# Patient Record
Sex: Female | Born: 1945
Health system: Southern US, Community
[De-identification: ages and names within clinical notes are randomized; demographics above are authoritative.]

## PROBLEM LIST (undated history)

## (undated) DIAGNOSIS — A419 Sepsis, unspecified organism: Secondary | ICD-10-CM

## (undated) DIAGNOSIS — F329 Major depressive disorder, single episode, unspecified: Secondary | ICD-10-CM

## (undated) DIAGNOSIS — I10 Essential (primary) hypertension: Secondary | ICD-10-CM

## (undated) DIAGNOSIS — J301 Allergic rhinitis due to pollen: Secondary | ICD-10-CM

## (undated) DIAGNOSIS — F32A Depression, unspecified: Secondary | ICD-10-CM

## (undated) DIAGNOSIS — I499 Cardiac arrhythmia, unspecified: Secondary | ICD-10-CM

## (undated) DIAGNOSIS — K219 Gastro-esophageal reflux disease without esophagitis: Secondary | ICD-10-CM

## (undated) DIAGNOSIS — Z9289 Personal history of other medical treatment: Secondary | ICD-10-CM

## (undated) DIAGNOSIS — B019 Varicella without complication: Secondary | ICD-10-CM

## (undated) DIAGNOSIS — I219 Acute myocardial infarction, unspecified: Secondary | ICD-10-CM

## (undated) DIAGNOSIS — Z87898 Personal history of other specified conditions: Secondary | ICD-10-CM

## (undated) DIAGNOSIS — J189 Pneumonia, unspecified organism: Secondary | ICD-10-CM

## (undated) DIAGNOSIS — E785 Hyperlipidemia, unspecified: Secondary | ICD-10-CM

## (undated) DIAGNOSIS — Z8639 Personal history of other endocrine, nutritional and metabolic disease: Secondary | ICD-10-CM

## (undated) DIAGNOSIS — J449 Chronic obstructive pulmonary disease, unspecified: Secondary | ICD-10-CM

## (undated) DIAGNOSIS — E039 Hypothyroidism, unspecified: Secondary | ICD-10-CM

## (undated) DIAGNOSIS — I2510 Atherosclerotic heart disease of native coronary artery without angina pectoris: Secondary | ICD-10-CM

## (undated) HISTORY — DX: Depression, unspecified: F32.A

## (undated) HISTORY — DX: Essential (primary) hypertension: I10

## (undated) HISTORY — DX: Hypothyroidism, unspecified: E03.9

## (undated) HISTORY — DX: Major depressive disorder, single episode, unspecified: F32.9

## (undated) HISTORY — DX: Hyperlipidemia, unspecified: E78.5

## (undated) HISTORY — PX: TUBAL LIGATION: SHX77

## (undated) HISTORY — DX: Varicella without complication: B01.9

## (undated) HISTORY — DX: Sepsis, unspecified organism: A41.9

## (undated) HISTORY — DX: Pneumonia, unspecified organism: J18.9

## (undated) HISTORY — PX: TONSILLECTOMY: SUR1361

## (undated) HISTORY — DX: Cardiac arrhythmia, unspecified: I49.9

## (undated) HISTORY — DX: Acute myocardial infarction, unspecified: I21.9

## (undated) HISTORY — DX: Personal history of other medical treatment: Z92.89

## (undated) HISTORY — DX: Personal history of other endocrine, nutritional and metabolic disease: Z86.39

## (undated) HISTORY — DX: Atherosclerotic heart disease of native coronary artery without angina pectoris: I25.10

## (undated) HISTORY — DX: Chronic obstructive pulmonary disease, unspecified: J44.9

## (undated) HISTORY — DX: Gastro-esophageal reflux disease without esophagitis: K21.9

## (undated) HISTORY — DX: Personal history of other specified conditions: Z87.898

## (undated) HISTORY — DX: Allergic rhinitis due to pollen: J30.1

---

## 1998-12-05 DIAGNOSIS — Z87898 Personal history of other specified conditions: Secondary | ICD-10-CM

## 1998-12-05 HISTORY — DX: Personal history of other specified conditions: Z87.898

## 2007-12-06 HISTORY — PX: CORONARY ANGIOPLASTY WITH STENT PLACEMENT: SHX49

## 2010-04-19 LAB — HM PAP SMEAR: HM Pap smear: NORMAL

## 2010-04-19 LAB — HM COLONOSCOPY: HM Colonoscopy: NORMAL

## 2011-05-21 LAB — HM MAMMOGRAPHY: HM Mammogram: NORMAL

## 2012-03-08 DIAGNOSIS — J189 Pneumonia, unspecified organism: Secondary | ICD-10-CM | POA: Diagnosis not present

## 2012-04-09 ENCOUNTER — Ambulatory Visit (INDEPENDENT_AMBULATORY_CARE_PROVIDER_SITE_OTHER): Payer: Medicare Other | Admitting: Pulmonary Disease

## 2012-04-09 ENCOUNTER — Encounter: Payer: Self-pay | Admitting: Pulmonary Disease

## 2012-04-09 VITALS — BP 114/74 | HR 66 | Temp 97.7°F | Ht 66.0 in | Wt 175.4 lb

## 2012-04-09 DIAGNOSIS — F17200 Nicotine dependence, unspecified, uncomplicated: Secondary | ICD-10-CM | POA: Diagnosis not present

## 2012-04-09 DIAGNOSIS — Z720 Tobacco use: Secondary | ICD-10-CM

## 2012-04-09 DIAGNOSIS — J449 Chronic obstructive pulmonary disease, unspecified: Secondary | ICD-10-CM | POA: Diagnosis not present

## 2012-04-09 DIAGNOSIS — J189 Pneumonia, unspecified organism: Secondary | ICD-10-CM

## 2012-04-09 DIAGNOSIS — J4489 Other specified chronic obstructive pulmonary disease: Secondary | ICD-10-CM

## 2012-04-09 NOTE — Progress Notes (Signed)
Subjective:    Patient ID: Jodi Beasley, female    DOB: 28-Apr-1946, 66 y.o.   MRN: [id number]  HPI 66 y/o female with a prior diagnosis of COPD comes to our clinic for the first time today to establish care.  She says that she has been under the care of Dr. Cristal Deer Beasley in [place], [place] for the last several years for shortness of breath and cough.  She believes that she has COPD.  She developed dyspnea on exertion after a PCI was performed for CAD in 2009.  She states that prior to that she had minimal symptoms.  She tolerated the symptoms for a while but then eventually was seen by Dr. Aundra Millet and was started on Symbicort and proAir.  She says that those have improved her shortness of breath dramatically.  She can do all the chores in her house and do her shopping without difficulty, but she gets short of breath when she climbs a hill or if chasing after her granddaughter.  She has a daily cough which is productive of scant clear sputum.  She was diagnosed with pneumonia three weeks ago when she developed fever and productive cough.  She went to urgent care where she was given a zpack and an another antibiotic via shot.  She was also given ventolin with a spacer.  Her symptoms have improved though she still notes some sputum production. She does not have chest pain or tightness with shortness of breath and does not have swelling. She states that over the years she has had allergy problems, and has seen an allergist for years (runny nose, sneeze).  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  Pine pollen and mold are particularly bad.  Past Medical History  Diagnosis Date  . Depression   . Chicken pox   . GERD (gastroesophageal reflux disease)   . Hay fever   . Hypertension   . Hyperlipidemia   . History of blood transfusion   . Pneumonia, organism unspecified   . COPD (chronic obstructive pulmonary disease)   . Hypothyroid      Family History   Problem Relation Age of Onset  . Colon cancer Father   . Lung cancer Father     was a former smoker  . Hypertension    . Diabetes       History   Social History  . Marital Status: Widowed    Spouse Name: N/A    Number of Children: N/A  . Years of Education: N/A   Occupational History  . Not on file.   Social History Main Topics  . Smoking status: Current Everyday Smoker -- 0.5 packs/day for 53 years    Types: Cigarettes  . Smokeless tobacco: Never Used  . Alcohol Use: 0.5 oz/week    1 drink(s) per week  . Drug Use: Not on file  . Sexually Active: Not on file   Other Topics Concern  . Not on file   Social History Narrative  . No narrative on file     Allergies  Allergen Reactions  . Benzocaine     Tongue swelling  . Darvon (Propoxyphene Hcl)     HA  . Neosporin (Neomycin-Bacitracin Zn-Polymyx)     blisters  . Nicoderm (Nicotine)     arrythmia     No outpatient prescriptions prior to visit.    Review of Systems  Constitutional: Negative for fever, chills and unexpected weight change.  HENT: Positive for rhinorrhea and postnasal drip. Negative for ear pain, nosebleeds, congestion, sore throat, sneezing, trouble swallowing, dental problem, voice change and sinus pressure.   Eyes: Negative for visual disturbance.  Respiratory: Positive for cough and shortness of breath. Negative for choking.   Cardiovascular: Negative for chest pain and leg swelling.  Gastrointestinal: Negative for vomiting, abdominal pain and diarrhea.  Genitourinary: Negative for difficulty urinating.  Musculoskeletal: Positive for arthralgias.  Skin: Negative for rash.  Neurological: Negative for tremors, syncope and headaches.  Hematological: Does not bruise/bleed easily.       Objective:   Physical Exam Filed Vitals:   04/09/12 1407  BP: 114/74  Pulse: 66  Temp: 97.7 F (36.5 C)  TempSrc: Oral  Height: 5\' 6"  (1.676 m)  Weight: 175 lb 6.4 oz (79.561 kg)  SpO2: 97%     Gen: well appearing, no acute distress HEENT: NCAT, PERRL, EOMi, OP clear, neck supple without masses;  PULM: CTA B CV: RRR, no mgr, no JVD AB: BS+, soft, nontender, no hsm Ext: warm, no edema, no clubbing, no cyanosis Derm: no rash or skin breakdown Neuro: A&Ox4, CN II-XII intact, strength 5/5 in all 4 extremities  04/09/2012 Simple Spirometry: Ratio 71%, FEV1 1.92L (77% pred); flow volume loop consistent with obstruction     Assessment & Plan:   COPD (chronic obstructive pulmonary disease) with chronic bronchitis [name] notes several years of dyspnea on exertion, daily cough, and scant sputum production after years of tobacco abuse.  Based on her history she has chronic bronchitis, but her simple spirometry today showed some evidence of obstruction on the flow volume loop but the F/F ratio and FEV1 was not diagnostic of COPD.  These were adequate studies.   I will obtain the records from her prior pulmonologist Dr. Aundra Millet to see what other work up has been performed.  In the meantime we will continue her symbicort and proAir HFA for a diagnosis of chronic bronchitis.  Based on her PFT's, I think we could step this down to a single agent, but she is reluctant to change medications on our first visit today.    We will send her for a CXR PA/Lateral to further assess her daily cough and shortness of breath.  She is up to date on her immunizations.    Pneumonia [name] was diagnosed with pneumonia 3 weeks ago at an urgent care facility and treated with a z-pack.  No CXR was obtained.  We will send her for a follow up CXR now to ensure resolution and to evaluate her chronic dyspnea and cough.  Tobacco abuse She has struggled with this for years.  We discussed the negative side effects at length today. She has tried Jodi Beasley, nicotine gum, nicotine patches, and hypnosis in the past.  She had severely bad dreams on the Jodi Beasley in the past.  She is willing to try calling the Jodi Beasley  for counselling help but doesn't want to start Jodi Beasley today per my recommendation.  She will think about it.    Updated Medication List Outpatient Encounter Prescriptions as of 04/09/2012  Medication Sig Dispense Refill  . albuterol (PROAIR HFA) 108 (90 BASE) MCG/ACT inhaler Inhale 2 puffs into the lungs every 6 (six) hours as needed.      Marland Kitchen aspirin 81 MG tablet Take 81 mg by mouth daily.      Marland Kitchen b complex vitamins tablet Take 1 tablet by mouth daily.      . carisoprodol (SOMA) 350 MG  tablet Take 350 mg by mouth 2 (two) times daily.      . cloNIDine (CATAPRES) 0.1 MG tablet Take 0.1 mg by mouth daily as needed.      . Coenzyme Q10 (CO Q-10) 100 MG CAPS Take 200 mg by mouth daily.      . fexofenadine (ALLEGRA) 180 MG tablet Take 180 mg by mouth daily.      . fluticasone (FLONASE) 50 MCG/ACT nasal spray Place 2 sprays into the nose daily.      . Glucosamine-Chondroit-Vit C-Mn (GLUCOSAMINE CHONDR 1500 COMPLX PO) Take 1 tablet by mouth daily.      . metoprolol (LOPRESSOR) 100 MG tablet Take 100 mg by mouth 2 (two) times daily.      . Multiple Vitamin (MULTIVITAMIN) capsule Take 1 capsule by mouth daily.      . nitroGLYCERIN (NITROSTAT) 0.4 MG SL tablet Place 0.4 mg under the tongue every 5 (five) minutes as needed.      . Omega-3 Fatty Acids (FISH OIL) 1200 MG CAPS Take 1 capsule by mouth daily.      Marland Kitchen omeprazole (PRILOSEC) 20 MG capsule Take 20 mg by mouth daily.      . Potassium Gluconate 550 MG TABS Take 1 tablet by mouth daily.      . rosuvastatin (CRESTOR) 40 MG tablet Take 40 mg by mouth daily.

## 2012-04-09 NOTE — Assessment & Plan Note (Signed)
Jodi Beasley was diagnosed with pneumonia 3 weeks ago at an urgent care facility and treated with a z-pack.  No CXR was obtained.  We will send her for a follow up CXR now to ensure resolution and to evaluate her chronic dyspnea and cough.

## 2012-04-09 NOTE — Assessment & Plan Note (Signed)
She has struggled with this for years.  We discussed the negative side effects at length today. She has tried Chantix, nicotine gum, nicotine patches, and hypnosis in the past.  She had severely bad dreams on the Chantix in the past.  She is willing to try calling the [place] [facility] for counselling help but doesn't want to start Wellbutrin today per my recommendation.  She will think about it.

## 2012-04-09 NOTE — Patient Instructions (Signed)
We will obtain records from Dr. Cristal Deer Poor's office. We will send you for a CXR to follow up your diagnosis of pneumonia. Continue taking your Symbicort and proAir as you are doing.  There is no reason that you need to substitute or even continue the ventolin for the proAir. Quit smoking! Call the 1-800-QUITNOW for assistance.  We will help you with medications if you desire.  We will see you back in 3-4 weeks.

## 2012-04-09 NOTE — Assessment & Plan Note (Addendum)
Jodi Beasley notes several years of dyspnea on exertion, daily cough, and scant sputum production after years of tobacco abuse.  Based on her history she has chronic bronchitis, but her simple spirometry today showed some evidence of obstruction on the flow volume loop but the F/F ratio and FEV1 was not diagnostic of COPD.  These were adequate studies.   I will obtain the records from her prior pulmonologist Dr. Aundra Millet to see what other work up has been performed.  In the meantime we will continue her symbicort and proAir HFA for a diagnosis of chronic bronchitis.  Based on her PFT's, I think we could step this down to a single agent, but she is reluctant to change medications on our first visit today.    We will send her for a CXR PA/Lateral to further assess her daily cough and shortness of breath.  She is up to date on her immunizations.

## 2012-04-12 ENCOUNTER — Ambulatory Visit (INDEPENDENT_AMBULATORY_CARE_PROVIDER_SITE_OTHER)
Admission: RE | Admit: 2012-04-12 | Discharge: 2012-04-12 | Disposition: A | Discharge status: Home / Self Care | Payer: Medicare Other | Source: Ambulatory Visit | Attending: Pulmonary Disease | Admitting: Pulmonary Disease

## 2012-04-12 DIAGNOSIS — J189 Pneumonia, unspecified organism: Secondary | ICD-10-CM | POA: Diagnosis not present

## 2012-04-12 DIAGNOSIS — J449 Chronic obstructive pulmonary disease, unspecified: Secondary | ICD-10-CM

## 2012-04-16 ENCOUNTER — Telehealth: Payer: Self-pay | Admitting: Pulmonary Disease

## 2012-04-16 NOTE — Telephone Encounter (Signed)
Pt is requesting her cxr results from 04/12/12. Please advise Dr. Kendrick Fries thanks

## 2012-04-16 NOTE — Telephone Encounter (Signed)
[id number] [id number] 

## 2012-04-16 NOTE — Telephone Encounter (Signed)
Please let her know it was normal 

## 2012-04-16 NOTE — Telephone Encounter (Signed)
I spoke with patient about results and she verbalized understanding and had no questions 

## 2012-04-19 ENCOUNTER — Ambulatory Visit (INDEPENDENT_AMBULATORY_CARE_PROVIDER_SITE_OTHER): Payer: Medicare Other | Admitting: Internal Medicine

## 2012-04-19 ENCOUNTER — Encounter: Payer: Self-pay | Admitting: Internal Medicine

## 2012-04-19 VITALS — BP 108/72 | HR 70 | Temp 98.2°F | Resp 16 | Wt 174.2 lb

## 2012-04-19 DIAGNOSIS — E039 Hypothyroidism, unspecified: Secondary | ICD-10-CM

## 2012-04-19 DIAGNOSIS — Z1283 Encounter for screening for malignant neoplasm of skin: Secondary | ICD-10-CM | POA: Diagnosis not present

## 2012-04-19 DIAGNOSIS — I2510 Atherosclerotic heart disease of native coronary artery without angina pectoris: Secondary | ICD-10-CM | POA: Diagnosis not present

## 2012-04-19 DIAGNOSIS — J4489 Other specified chronic obstructive pulmonary disease: Secondary | ICD-10-CM

## 2012-04-19 DIAGNOSIS — J449 Chronic obstructive pulmonary disease, unspecified: Secondary | ICD-10-CM | POA: Insufficient documentation

## 2012-04-19 DIAGNOSIS — F17200 Nicotine dependence, unspecified, uncomplicated: Secondary | ICD-10-CM

## 2012-04-19 DIAGNOSIS — N952 Postmenopausal atrophic vaginitis: Secondary | ICD-10-CM | POA: Insufficient documentation

## 2012-04-19 DIAGNOSIS — E785 Hyperlipidemia, unspecified: Secondary | ICD-10-CM

## 2012-04-19 DIAGNOSIS — Z720 Tobacco use: Secondary | ICD-10-CM

## 2012-04-19 MED ORDER — BUDESONIDE-FORMOTEROL FUMARATE 160-4.5 MCG/ACT IN AERO: 2.0000 | INHALATION_SPRAY | Freq: Two times a day (BID) | RESPIRATORY_TRACT | Status: DC

## 2012-04-19 MED ORDER — LEVOTHYROXINE SODIUM 125 MCG PO TABS: 125.0000 ug | ORAL_TABLET | Freq: Every day | ORAL | Status: DC

## 2012-04-19 NOTE — Progress Notes (Signed)
Subjective:    Patient ID: Jodi Beasley, female    DOB: July 26, 1946, 66 y.o.   MRN: [id number]  HPI 66 year old female with history of COPD, CAD, hyperlipidemia, hypothyroidism presents to establish care. She reports that she is generally feeling well. She recently moved to [facility] from [facility]. She reports that she has established care with pulmonary medicine. She reports that she recently had an episode of pneumonia for which she was treated as an outpatient. She feels that her symptoms have completely resolved. She continues to have some mild shortness of breath which is chronic for her. She is a current smoker. She smokes approximately 6 cigarettes per day. She is not interested in quitting at this time.  In regards to her history of CAD, she reports that in 2009 she developed chest pain and was seen at [facility]. She ultimately had 4 stents placed. Review of record shows that these were drug-eluting stents. However, she is no longer taking Plavix. She denies any recent chest pain, palpitations, or other symptoms. She reports a history of very elevated cholesterol the past. She is currently taking Crestor 40 mg daily. She denies any symptoms such as myalgia at this time.  Outpatient Encounter Prescriptions as of 04/19/2012  Medication Sig Dispense Refill  . albuterol (PROAIR HFA) 108 (90 BASE) MCG/ACT inhaler Inhale 2 puffs into the lungs every 6 (six) hours as needed.      Marland Kitchen aspirin 81 MG tablet Take 81 mg by mouth daily.      Marland Kitchen b complex vitamins tablet Take 1 tablet by mouth daily.      . carisoprodol (SOMA) 350 MG tablet Take 350 mg by mouth 2 (two) times daily.      . cloNIDine (CATAPRES) 0.1 MG tablet Take 0.1 mg by mouth daily as needed.      . Coenzyme Q10 (CO Q-10) 100 MG CAPS Take 200 mg by mouth daily.      . fexofenadine (ALLEGRA) 180 MG tablet Take 180 mg by mouth daily.      . fluticasone (FLONASE) 50 MCG/ACT nasal spray Place 2 sprays into the  nose daily.      . Glucosamine-Chondroit-Vit C-Mn (GLUCOSAMINE CHONDR 1500 COMPLX PO) Take 1 tablet by mouth daily.      . metoprolol (LOPRESSOR) 100 MG tablet Take 100 mg by mouth 2 (two) times daily.      . Multiple Vitamin (MULTIVITAMIN) capsule Take 1 capsule by mouth daily.      . nitroGLYCERIN (NITROSTAT) 0.4 MG SL tablet Place 0.4 mg under the tongue every 5 (five) minutes as needed.      . Omega-3 Fatty Acids (FISH OIL) 1200 MG CAPS Take 1 capsule by mouth daily.      Marland Kitchen omeprazole (PRILOSEC) 20 MG capsule Take 20 mg by mouth daily.      . Potassium Gluconate 550 MG TABS Take 1 tablet by mouth daily.      . rosuvastatin (CRESTOR) 40 MG tablet Take 40 mg by mouth daily.      . budesonide-formoterol (SYMBICORT) 160-4.5 MCG/ACT inhaler Inhale 2 puffs into the lungs 2 (two) times daily.  1 Inhaler  3  . levothyroxine (SYNTHROID, LEVOTHROID) 125 MCG tablet Take 1 tablet (125 mcg total) by mouth daily.  30 tablet  3    Review of Systems  Constitutional: Negative for fever, chills, appetite change, fatigue and unexpected weight change.  HENT: Negative for ear pain, congestion, sore throat, trouble swallowing, neck pain, voice  change and sinus pressure.   Eyes: Negative for visual disturbance.  Respiratory: Positive for cough and shortness of breath. Negative for wheezing and stridor.   Cardiovascular: Negative for chest pain, palpitations and leg swelling.  Gastrointestinal: Negative for nausea, vomiting, abdominal pain, diarrhea, constipation, blood in stool, abdominal distention and anal bleeding.  Genitourinary: Negative for dysuria and flank pain.  Musculoskeletal: Negative for myalgias, arthralgias and gait problem.  Skin: Negative for color change and rash.  Neurological: Negative for dizziness and headaches.  Hematological: Negative for adenopathy. Does not bruise/bleed easily.  Psychiatric/Behavioral: Negative for suicidal ideas, sleep disturbance and dysphoric mood. The patient is  not nervous/anxious.    BP 108/72  Pulse 70  Temp(Src) 98.2 F (36.8 C) (Oral)  Resp 16  Wt 174 lb 4 oz (79.039 kg)  SpO2 97%     Objective:   Physical Exam  Constitutional: She is oriented to person, place, and time. She appears well-developed and well-nourished. No distress.  HENT:  Head: Normocephalic and atraumatic.  Right Ear: External ear normal.  Left Ear: External ear normal.  Nose: Nose normal.  Mouth/Throat: Oropharynx is clear and moist. No oropharyngeal exudate.  Eyes: Conjunctivae are normal. Pupils are equal, round, and reactive to light. Right eye exhibits no discharge. Left eye exhibits no discharge. No scleral icterus.  Neck: Normal range of motion. Neck supple. No tracheal deviation present. No thyromegaly present.  Cardiovascular: Normal rate, regular rhythm, normal heart sounds and intact distal pulses.  Exam reveals no gallop and no friction rub.   No murmur heard. Pulmonary/Chest: Effort normal. No accessory muscle usage. Not tachypneic. No respiratory distress. She has decreased breath sounds (prolonged exp). She has no wheezes. She has no rales. She exhibits no tenderness.  Abdominal: Soft. Bowel sounds are normal. She exhibits no distension and no mass. There is no tenderness. There is no guarding.  Musculoskeletal: Normal range of motion. She exhibits no edema and no tenderness.  Lymphadenopathy:    She has no cervical adenopathy.  Neurological: She is alert and oriented to person, place, and time. No cranial nerve deficit. She exhibits normal muscle tone. Coordination normal.  Skin: Skin is warm and dry. No rash noted. She is not diaphoretic. No erythema. No pallor.  Psychiatric: She has a normal mood and affect. Her behavior is normal. Judgment and thought content normal.          Assessment & Plan:

## 2012-04-19 NOTE — Assessment & Plan Note (Signed)
Encourage smoking cessation. Patient is not ready to quit at this time. Will continue to revisit. Given greater than 30-pack-year smoking history, will need yearly CT scan of the chest a screening for lung cancer.

## 2012-04-19 NOTE — Assessment & Plan Note (Signed)
Will set up dermatology referral for general exam.

## 2012-04-19 NOTE — Assessment & Plan Note (Signed)
We discussed the benefits and risk of topical estrogen preparations for atrophic vaginitis. Patient would prefer to continue to use this sparingly, as needed for symptoms of vaginal dryness and pain.

## 2012-04-19 NOTE — Assessment & Plan Note (Signed)
Will review recent labs including TSH level. Continue Synthroid.

## 2012-04-19 NOTE — Assessment & Plan Note (Signed)
Patient with history of coronary artery disease. Goal LDL less than 70. Will obtain recent records on lipids and LFTs. Continue Crestor 40 mg daily.

## 2012-04-19 NOTE — Assessment & Plan Note (Signed)
Symptoms currently well-controlled with inhaled bronchodilators and steroids. Given that patient has greater than 30 pack year history of smoking, recent guidelines would indicate patient needs screening CT of the chest on yearly basis. Will review recent records to see if this is been performed. If not, will set up scan. Encouraged smoking cessation. Followup in 3 months.

## 2012-04-19 NOTE — Assessment & Plan Note (Signed)
Currently asymptomatic. Will obtain records on previous evaluation and management. Will continue Crestor. Goal LDL less than 70. Will continue aspirin and metoprolol. Patient will establish care with cardiology next week. She will followup here in 3 months.

## 2012-04-23 ENCOUNTER — Encounter: Payer: Self-pay | Admitting: Cardiovascular Disease

## 2012-05-01 ENCOUNTER — Encounter: Payer: Self-pay | Admitting: Cardiovascular Disease

## 2012-05-01 ENCOUNTER — Telehealth: Payer: Self-pay | Admitting: Internal Medicine

## 2012-05-01 ENCOUNTER — Ambulatory Visit (INDEPENDENT_AMBULATORY_CARE_PROVIDER_SITE_OTHER): Payer: Medicare Other | Admitting: Cardiovascular Disease

## 2012-05-01 VITALS — BP 110/78 | HR 64 | Ht 66.0 in | Wt 172.5 lb

## 2012-05-01 DIAGNOSIS — R0602 Shortness of breath: Secondary | ICD-10-CM

## 2012-05-01 DIAGNOSIS — F17200 Nicotine dependence, unspecified, uncomplicated: Secondary | ICD-10-CM

## 2012-05-01 DIAGNOSIS — I2510 Atherosclerotic heart disease of native coronary artery without angina pectoris: Secondary | ICD-10-CM

## 2012-05-01 DIAGNOSIS — I10 Essential (primary) hypertension: Secondary | ICD-10-CM | POA: Insufficient documentation

## 2012-05-01 DIAGNOSIS — E785 Hyperlipidemia, unspecified: Secondary | ICD-10-CM | POA: Insufficient documentation

## 2012-05-01 DIAGNOSIS — Z720 Tobacco use: Secondary | ICD-10-CM

## 2012-05-01 DIAGNOSIS — J0110 Acute frontal sinusitis, unspecified: Secondary | ICD-10-CM | POA: Diagnosis not present

## 2012-05-01 DIAGNOSIS — J449 Chronic obstructive pulmonary disease, unspecified: Secondary | ICD-10-CM | POA: Diagnosis not present

## 2012-05-01 DIAGNOSIS — R079 Chest pain, unspecified: Secondary | ICD-10-CM | POA: Diagnosis not present

## 2012-05-01 NOTE — Patient Instructions (Signed)
Continue same medications.  Follow up in 6 months.  

## 2012-05-01 NOTE — Telephone Encounter (Signed)
Caller: Charnell/Patient; PCP: Ronna Polio; CB#: [phone]; Call regarding Cough; onset approx 04/24/12.   Green tinged sputum.  Uses Symbicort and Proair.  See provider in 4 hours per Cough  protocol.  Advised to go to [facility] or ED  by Jasmine December at office.  Caller informed of same. Caller unsure which facility she will go to.

## 2012-05-01 NOTE — Assessment & Plan Note (Signed)
She is on high-dose Crestor. She will be having a fasting lipid and liver profile in the next few months which was ordered by Dr. Dan Humphreys. I agree with a target LDL of less than 70.

## 2012-05-01 NOTE — Progress Notes (Signed)
HPI  This is a 66 year old female who is here to transfer cardiovascular care from [facility] clinic after she moved recently to [place]. She has known history of coronary artery disease. She presented in October 2009 with a small non-ST elevation myocardial infarction. Cardiac catheterization showed significant two-vessel coronary artery disease in the RCA as well as first diagonal. She underwent an angioplasty and drug-eluting stent placement x2 to the RCA and 1 stent to the first diagonal. Shortly after the procedure and while she was in the holding area, she developed acute chest pain with lateral ST elevation. She was taken back to the cardiac cath lab where she was found to have acute stent thrombosis in the first diagonal. She underwent thrombectomy and balloon angioplasty. She took Plavix for about a year and a half after that. She has not had any cardiac events since then. She does have history of COPD and has cut down smoking to about 6 cigarettes a day. She's not able to quit at this time. Most recent stress test was in 2010 which showed no evidence of ischemia with an ejection fraction of 69%. She has known history of hyperlipidemia with intolerance to multiple statins. She has been able to tolerate Crestor. She currently denies any chest pain. She has chronic dyspnea related to COPD. She denies any palpitations, syncope or presyncope. She has been having increased problems of cough and frequent bronchitis. All her cardiac records were reviewed.  Allergies  Allergen Reactions  . Benzocaine     Tongue swelling  . Darvon (Propoxyphene Hcl)     HA  . Neosporin (Neomycin-Bacitracin Zn-Polymyx)     blisters  . Nicoderm (Nicotine)     arrythmia     Current Outpatient Prescriptions on File Prior to Visit  Medication Sig Dispense Refill  . albuterol (PROAIR HFA) 108 (90 BASE) MCG/ACT inhaler Inhale 2 puffs into the lungs every 6 (six) hours as needed.      Marland Kitchen aspirin 81 MG tablet Take  81 mg by mouth daily.      Marland Kitchen azelastine (ASTELIN) 137 MCG/SPRAY nasal spray Place 1 spray into the nose 2 (two) times daily. Use in each nostril as directed      . b complex vitamins tablet Take 1 tablet by mouth daily.      . budesonide-formoterol (SYMBICORT) 160-4.5 MCG/ACT inhaler Inhale 2 puffs into the lungs 2 (two) times daily.  1 Inhaler  3  . carisoprodol (SOMA) 350 MG tablet Take 350 mg by mouth 2 (two) times daily.      . citalopram (CELEXA) 20 MG tablet Take 20 mg by mouth daily.      . cloNIDine (CATAPRES) 0.1 MG tablet Take 0.1 mg by mouth daily as needed.      . Coenzyme Q-10 400 MG CAPS Take 400 mg by mouth 2 (two) times daily.      . fexofenadine (ALLEGRA) 180 MG tablet Take 180 mg by mouth daily.      . fluticasone (FLONASE) 50 MCG/ACT nasal spray Place 2 sprays into the nose daily.      . Glucosamine-Chondroit-Vit C-Mn (GLUCOSAMINE CHONDR 1500 COMPLX PO) Take 1 tablet by mouth daily.      Marland Kitchen levothyroxine (SYNTHROID, LEVOTHROID) 125 MCG tablet Take 1 tablet (125 mcg total) by mouth daily.  30 tablet  3  . metoprolol (LOPRESSOR) 100 MG tablet Take 100 mg by mouth 2 (two) times daily.      . Multiple Vitamin (MULTIVITAMIN) capsule Take 1 capsule  by mouth daily.      . nitroGLYCERIN (NITROSTAT) 0.4 MG SL tablet Place 0.4 mg under the tongue every 5 (five) minutes as needed.      . Omega-3 Fatty Acids (FISH OIL) 1200 MG CAPS Take 1 capsule by mouth daily.      Marland Kitchen omeprazole (PRILOSEC) 20 MG capsule Take 20 mg by mouth daily.      . Potassium Gluconate 550 MG TABS Take 1 tablet by mouth daily.      . rosuvastatin (CRESTOR) 40 MG tablet Take 40 mg by mouth daily.         Past Medical History  Diagnosis Date  . Depression   . Chicken pox   . GERD (gastroesophageal reflux disease)   . Hay fever   . Hypertension   . History of blood transfusion   . Pneumonia, organism unspecified   . Hypothyroid   . COPD (chronic obstructive pulmonary disease)   . History of syncope 2000     negative EP study  . History Of Hypercholesterolemia   . Arrhythmia   . Hyperlipidemia     intolerance to statins except Crestor.   . Coronary artery disease     NSTEMI in 09/2008. Cath : 80% RCA and 95% first diagonal. PCI and 2 DES placement  RCA and 1 DES to diagonal. Complicated by acute diagonal stent thrombosis . Treated by thrombectomy. Most recent nuclear stress test in 2010 showed no ischemia with normal EF.      Past Surgical History  Procedure Date  . Tubal ligation   . Tonsillectomy   . Coronary angioplasty with stent placement 2009    [facility], drug-eluting mid RCA,prox diag     Family History  Problem Relation Age of Onset  . Colon cancer Father   . Lung cancer Father     was a former smoker  . Cancer Father     lung  . Hypertension    . Diabetes    . Hypertension Mother      History   Social History  . Marital Status: Widowed    Spouse Name: N/A    Number of Children: N/A  . Years of Education: N/A   Occupational History  . Not on file.   Social History Main Topics  . Smoking status: Current Everyday Smoker -- 0.5 packs/day for 53 years    Types: Cigarettes  . Smokeless tobacco: Never Used  . Alcohol Use: 0.5 oz/week    1 drink(s) per week  . Drug Use: No  . Sexually Active: Not on file   Other Topics Concern  . Not on file   Social History Narrative   Lives with partner in [place]. 2 cats 2 dogs in home.Recently moved from [facility].Custody of 89month old.     ROS Constitutional: Negative for fever, chills, diaphoresis, activity change, appetite change and fatigue.  HENT: Negative for hearing loss, nosebleeds, congestion, sore throat, facial swelling, drooling, trouble swallowing, neck pain, voice change, sinus pressure and tinnitus.  Eyes: Negative for photophobia, pain, discharge and visual disturbance.  Respiratory: Negative for apnea,chest tightness.  Cardiovascular: Negative for chest pain, palpitations and leg swelling.    Gastrointestinal: Negative for nausea, vomiting, abdominal pain, diarrhea, constipation, blood in stool and abdominal distention.  Genitourinary: Negative for dysuria, urgency, frequency, hematuria and decreased urine volume.  Musculoskeletal: Negative for myalgias, back pain, joint swelling, arthralgias and gait problem.  Skin: Negative for color change, pallor, rash and wound.  Neurological: Negative for dizziness,  tremors, seizures, syncope, speech difficulty, weakness, light-headedness, numbness and headaches.  Psychiatric/Behavioral: Negative for suicidal ideas, hallucinations, behavioral problems and agitation. The patient is not nervous/anxious.     PHYSICAL EXAM   BP 110/78  Pulse 64  Ht 5\' 6"  (1.676 m)  Wt 172 lb 8 oz (78.245 kg)  BMI 27.84 kg/m2 Constitutional: She is oriented to person, place, and time. She appears well-developed and well-nourished. No distress.  HENT: No nasal discharge.  Head: Normocephalic and atraumatic.  Eyes: Pupils are equal and round. Right eye exhibits no discharge. Left eye exhibits no discharge.  Neck: Normal range of motion. Neck supple. No JVD present. No thyromegaly present.  Cardiovascular: Normal rate, regular rhythm, normal heart sounds. Exam reveals no gallop and no friction rub. No murmur heard.  Pulmonary/Chest: Effort normal and breath sounds normal. No stridor. No respiratory distress. She has no wheezes. She has no rales. She exhibits no tenderness.  Abdominal: Soft. Bowel sounds are normal. She exhibits no distension. There is no tenderness. There is no rebound and no guarding.  Musculoskeletal: Normal range of motion. She exhibits no edema and no tenderness.  Neurological: She is alert and oriented to person, place, and time. Coordination normal.  Skin: Skin is warm and dry. No rash noted. She is not diaphoretic. No erythema. No pallor.  Psychiatric: She has a normal mood and affect. Her behavior is normal. Judgment and thought  content normal.     EKG: Normal sinus rhythm with no significant ST or T wave changes. No evidence of prior infarcts.   ASSESSMENT AND PLAN

## 2012-05-01 NOTE — Assessment & Plan Note (Signed)
I discussed with the patient the importance of smoking cessation. She doesn't think she can quit at this time and she has tried multiple interventions in the past.

## 2012-05-01 NOTE — Assessment & Plan Note (Addendum)
The patient seems to be stable from a cardiac standpoint. She is not having symptoms suggestive of angina. I recommend continuing medical therapy. Most recent stress test was in 2010 which showed no evidence of ischemia with normal ejection fraction.

## 2012-05-01 NOTE — Assessment & Plan Note (Signed)
Her blood pressure is well controlled. Continue current medications. 

## 2012-05-11 ENCOUNTER — Ambulatory Visit (INDEPENDENT_AMBULATORY_CARE_PROVIDER_SITE_OTHER): Payer: Medicare Other | Admitting: Internal Medicine

## 2012-05-11 ENCOUNTER — Encounter: Payer: Self-pay | Admitting: Internal Medicine

## 2012-05-11 VITALS — BP 100/70 | HR 64 | Temp 98.6°F | Wt 172.8 lb

## 2012-05-11 DIAGNOSIS — J449 Chronic obstructive pulmonary disease, unspecified: Secondary | ICD-10-CM | POA: Diagnosis not present

## 2012-05-11 MED ORDER — AZELASTINE HCL 0.1 % NA SOLN: 1.0000 | Freq: Two times a day (BID) | NASAL | Status: DC

## 2012-05-11 NOTE — Assessment & Plan Note (Signed)
Patient has now recovered from recent episode of bronchitis. She is back to her baseline. Will continue inhaled bronchodilators and steroids. She has followup with her pulmonologist in 6 weeks. She will call sooner if symptoms recur or if she has any questions or concerns.

## 2012-05-11 NOTE — Progress Notes (Signed)
Subjective:    Patient ID: Jodi Beasley, female    DOB: 05-05-1946, 66 y.o.   MRN: [id number]  HPI 66 year old female with history of COPD, CAD presents for followup after recent evaluation at urgent care with diagnosis of bronchitis and sinusitis. She reports that she had several days of cough productive of purulent sputum, shortness of breath, sinus congestion and sinus pressure with headache. She was evaluated at urgent care and diagnosed with bronchitis and sinusitis. She was treated with Levaquin and hydrocodone for cough. She reports significant improvement with these medications. She has now completed her antibiotics. She continues to have some nonproductive cough. She denies shortness of breath, chest pain. She has not had any recent fever or chills.  Outpatient Encounter Prescriptions as of 05/11/2012  Medication Sig Dispense Refill  . albuterol (PROAIR HFA) 108 (90 BASE) MCG/ACT inhaler Inhale 2 puffs into the lungs every 6 (six) hours as needed.      Marland Kitchen aspirin 81 MG tablet Take 81 mg by mouth daily.      Marland Kitchen azelastine (ASTELIN) 137 MCG/SPRAY nasal spray Place 1 spray into the nose 2 (two) times daily. Use in each nostril as directed  30 mL  6  . b complex vitamins tablet Take 1 tablet by mouth daily.      . budesonide-formoterol (SYMBICORT) 160-4.5 MCG/ACT inhaler Inhale 2 puffs into the lungs 2 (two) times daily.  1 Inhaler  3  . carisoprodol (SOMA) 350 MG tablet Take 350 mg by mouth 2 (two) times daily.      . citalopram (CELEXA) 20 MG tablet Take 20 mg by mouth daily.      . cloNIDine (CATAPRES) 0.1 MG tablet Take 0.1 mg by mouth daily as needed.      . Coenzyme Q-10 400 MG CAPS Take 400 mg by mouth 2 (two) times daily.      . fexofenadine (ALLEGRA) 180 MG tablet Take 180 mg by mouth daily.      . fluticasone (FLONASE) 50 MCG/ACT nasal spray Place 2 sprays into the nose daily.      . Glucosamine-Chondroit-Vit C-Mn (GLUCOSAMINE CHONDR 1500 COMPLX PO) Take 1 tablet by mouth daily.        Marland Kitchen levothyroxine (SYNTHROID, LEVOTHROID) 125 MCG tablet Take 1 tablet (125 mcg total) by mouth daily.  30 tablet  3  . metoprolol (LOPRESSOR) 100 MG tablet Take 100 mg by mouth 2 (two) times daily.      . Multiple Vitamin (MULTIVITAMIN) capsule Take 1 capsule by mouth daily.      . nitroGLYCERIN (NITROSTAT) 0.4 MG SL tablet Place 0.4 mg under the tongue every 5 (five) minutes as needed.      . Omega-3 Fatty Acids (FISH OIL) 1200 MG CAPS Take 1 capsule by mouth daily.      Marland Kitchen omeprazole (PRILOSEC) 20 MG capsule Take 20 mg by mouth daily.      . Potassium Gluconate 550 MG TABS Take 1 tablet by mouth daily.      . rosuvastatin (CRESTOR) 40 MG tablet Take 40 mg by mouth daily.      Marland Kitchen DISCONTD: azelastine (ASTELIN) 137 MCG/SPRAY nasal spray Place 1 spray into the nose 2 (two) times daily. Use in each nostril as directed       BP 100/70  Pulse 64  Temp 98.6 F (37 C)  Wt 172 lb 12 oz (78.359 kg)  SpO2 94%  Review of Systems  Constitutional: Negative for fever, chills, appetite change, fatigue and unexpected weight  change.  HENT: Positive for congestion and sinus pressure. Negative for ear pain and neck pain.   Eyes: Negative for visual disturbance.  Respiratory: Positive for cough. Negative for shortness of breath, wheezing and stridor.   Cardiovascular: Negative for chest pain, palpitations and leg swelling.  Gastrointestinal: Negative for abdominal pain and abdominal distention.  Genitourinary: Negative for dysuria and flank pain.  Musculoskeletal: Negative for myalgias, arthralgias and gait problem.  Skin: Negative for color change and rash.  Neurological: Negative for dizziness and headaches.  Hematological: Negative for adenopathy. Does not bruise/bleed easily.  Psychiatric/Behavioral: Negative for suicidal ideas, sleep disturbance and dysphoric mood. The patient is not nervous/anxious.        Objective:   Physical Exam  Constitutional: She is oriented to person, place, and time.  She appears well-developed and well-nourished. No distress.  HENT:  Head: Normocephalic and atraumatic.  Right Ear: External ear normal.  Left Ear: External ear normal.  Nose: Nose normal.  Mouth/Throat: Oropharynx is clear and moist. No oropharyngeal exudate.  Eyes: Conjunctivae are normal. Pupils are equal, round, and reactive to light. Right eye exhibits no discharge. Left eye exhibits no discharge. No scleral icterus.  Neck: Normal range of motion. Neck supple. No tracheal deviation present. No thyromegaly present.  Cardiovascular: Normal rate, regular rhythm, normal heart sounds and intact distal pulses.  Exam reveals no gallop and no friction rub.   No murmur heard. Pulmonary/Chest: Effort normal. No accessory muscle usage. Not tachypneic. No respiratory distress. She has decreased breath sounds. She has no wheezes. She has no rhonchi. She has no rales. She exhibits no tenderness.  Musculoskeletal: Normal range of motion. She exhibits no edema and no tenderness.  Lymphadenopathy:    She has no cervical adenopathy.  Neurological: She is alert and oriented to person, place, and time. No cranial nerve deficit. She exhibits normal muscle tone. Coordination normal.  Skin: Skin is warm and dry. No rash noted. She is not diaphoretic. No erythema. No pallor.  Psychiatric: She has a normal mood and affect. Her behavior is normal. Judgment and thought content normal.          Assessment & Plan:

## 2012-05-26 ENCOUNTER — Encounter: Payer: Self-pay | Admitting: Internal Medicine

## 2012-05-28 ENCOUNTER — Encounter: Payer: Self-pay | Admitting: Internal Medicine

## 2012-05-28 DIAGNOSIS — Z1239 Encounter for other screening for malignant neoplasm of breast: Secondary | ICD-10-CM

## 2012-06-11 ENCOUNTER — Encounter: Payer: Self-pay | Admitting: Pulmonary Disease

## 2012-06-11 ENCOUNTER — Ambulatory Visit (INDEPENDENT_AMBULATORY_CARE_PROVIDER_SITE_OTHER): Payer: Medicare Other | Admitting: Pulmonary Disease

## 2012-06-11 VITALS — BP 108/60 | HR 74 | Temp 98.0°F | Ht 66.0 in | Wt 173.0 lb

## 2012-06-11 DIAGNOSIS — Z720 Tobacco use: Secondary | ICD-10-CM

## 2012-06-11 DIAGNOSIS — J449 Chronic obstructive pulmonary disease, unspecified: Secondary | ICD-10-CM | POA: Diagnosis not present

## 2012-06-11 DIAGNOSIS — F17200 Nicotine dependence, unspecified, uncomplicated: Secondary | ICD-10-CM

## 2012-06-11 MED ORDER — NICOTINE 10 MG/ML NA SOLN: NASAL | Status: DC

## 2012-06-11 NOTE — Patient Instructions (Addendum)
Try stopping the Symbicort and replacing it with Spiriva. If you don't feel any worse on the Spiriva, call us and we will send in an Rx for it. Stop smoking. Try using the nicotrol inhaler as needed to help. Think about lung cancer screening, read the form we gave you.  We are happy to refer you if needed. We will see you back in 3 months or sooner if needed.

## 2012-06-11 NOTE — Progress Notes (Signed)
Subjective:    Patient ID: Jodi Beasley, female    DOB: [birth year], 66 y.o.   MRN: [id number]  Synopsis: Jodi Beasley established care with a [place] [facility] clinic in May 2013 procedure 80. She had samples from a tree at that time the ratio 71% the flow volume loop consistent with obstruction and an FEV1 of 1.92 L or 77% predicted. She then followed previously by Jodi Beasley the pulmonologist in the [facility] area. She had been treated for pneumonia 3 weeks prior to her initial visit.  She has smoked one half pack of cigarettes per day for 53 years and continues to smoke.  HPI    7/8 ROV -- Jodi Beasley returns to clinic today for evaluation of her COPD. She states that she had an episode of bronchitis in May which improved with treatment with antibiotics. She do not now how to get a hold of [place] so she ended up going to urgent care. Since then she states her symptoms have improved dramatically and she is not having to use her rescue inhaler much. She is using Astelin nasal spray more often than the Nasonex and she says it worked quite well. She really only has her breath in the heat and humidity. She continues to smoke and is frustrated with her inability to quit. She states that nicotine replacement is helped most of the past.   Past Medical History  Diagnosis Date  . Depression   . Chicken pox   . GERD (gastroesophageal reflux disease)   . Hay fever   . History of blood transfusion   . Pneumonia, organism unspecified   . Hypothyroid   . COPD (chronic obstructive pulmonary disease)   . History of syncope 2000    negative EP study  . History Of Hypercholesterolemia   . Arrhythmia   . Hyperlipidemia     intolerance to statins except Crestor.   . Coronary artery disease     NSTEMI in 09/2008. Cath : 80% RCA and 95% first diagonal. PCI and 2 DES placement  RCA and 1 DES to diagonal. Complicated by acute diagonal stent thrombosis . Treated by thrombectomy. Most recent  nuclear stress test in 2010 showed no ischemia with normal EF.   Marland Kitchen Hypertension     Review of Systems  Constitutional: Negative for fever, fatigue and unexpected weight change.  HENT: Negative for congestion, rhinorrhea, postnasal drip and sinus pressure.   Respiratory: Positive for shortness of breath. Negative for cough and choking.   Cardiovascular: Negative for chest pain and leg swelling.       Objective:   Physical Exam  Filed Vitals:   06/11/12 1338  BP: 108/60  Pulse: 74  Temp: 98 F (36.7 C)  TempSrc: Oral  Height: 5\' 6"  (1.676 m)  Weight: 173 lb (78.472 kg)  SpO2: 94%    Gen: well appearing, no acute distress HEENT: NCAT, PERRL, EOMi, OP clear, neck supple without masses;  PULM: CTA B CV: RRR, no mgr, no JVD AB: BS+, soft, nontender, no hsm Ext: warm, no edema, no clubbing, no cyanosis   04/09/2012 Simple Spirometry: Ratio 71%, FEV1 1.92L (77% pred); flow volume loop consistent with obstruction     Assessment & Plan:   No problem-specific assessment & plan notes found for this encounter.   Updated Medication List Outpatient Encounter Prescriptions as of 06/11/2012  Medication Sig Dispense Refill  . albuterol (PROAIR HFA) 108 (90 BASE) MCG/ACT inhaler Inhale 2 puffs into the lungs every 6 (six)  hours as needed.      Marland Kitchen aspirin 81 MG tablet Take 81 mg by mouth daily.      Marland Kitchen azelastine (ASTELIN) 137 MCG/SPRAY nasal spray Place 1 spray into the nose 2 (two) times daily. Use in each nostril as directed  30 mL  6  . b complex vitamins tablet Take 1 tablet by mouth daily.      . budesonide-formoterol (SYMBICORT) 160-4.5 MCG/ACT inhaler Inhale 2 puffs into the lungs 2 (two) times daily.  1 Inhaler  3  . carisoprodol (SOMA) 350 MG tablet Take 350 mg by mouth 2 (two) times daily.      . citalopram (CELEXA) 20 MG tablet Take 20 mg by mouth daily.      . cloNIDine (CATAPRES) 0.1 MG tablet Take 0.1 mg by mouth daily as needed.      . Coenzyme Q-10 400 MG CAPS Take 400 mg  by mouth 2 (two) times daily.      . fluticasone (FLONASE) 50 MCG/ACT nasal spray Place 2 sprays into the nose daily as needed.       . Glucosamine-Chondroit-Vit C-Mn (GLUCOSAMINE CHONDR 1500 COMPLX PO) Take 1 tablet by mouth daily.      Marland Kitchen levothyroxine (SYNTHROID, LEVOTHROID) 125 MCG tablet Take 1 tablet (125 mcg total) by mouth daily.  30 tablet  3  . metoprolol (LOPRESSOR) 100 MG tablet Take 100 mg by mouth 2 (two) times daily.      . Multiple Vitamin (MULTIVITAMIN) capsule Take 1 capsule by mouth daily.      . nitroGLYCERIN (NITROSTAT) 0.4 MG SL tablet Place 0.4 mg under the tongue every 5 (five) minutes as needed.      . Omega-3 Fatty Acids (FISH OIL) 1200 MG CAPS Take 1 capsule by mouth daily.      Marland Kitchen omeprazole (PRILOSEC) 20 MG capsule Take 20 mg by mouth daily.      . Potassium Gluconate 550 MG TABS Take 1 tablet by mouth daily.      . rosuvastatin (CRESTOR) 40 MG tablet Take 40 mg by mouth daily.      Marland Kitchen DISCONTD: fexofenadine (ALLEGRA) 180 MG tablet Take 180 mg by mouth daily.

## 2012-06-12 ENCOUNTER — Encounter: Payer: Self-pay | Admitting: Pulmonary Disease

## 2012-06-12 NOTE — Assessment & Plan Note (Addendum)
This is been a stable interval for Jodi Beasley. However she is still frustrated by the number of flares of COPD that she had in the spring of 2013. I explained to her that this is obviously due to her ongoing smoking. Will work on that. See below. However perhaps she may respond better to Spiriva so I instructed her to stop the Symbicort and to start a trial of tiotropium.

## 2012-06-12 NOTE — Assessment & Plan Note (Signed)
We talked about this at length today. She's been frustrated by her inability to quit over the years. She has tried of modalities including hypnosis 3 times as well as a variety of oral medications. She states that nicotine replacement worked the best in the past. Based on her insurance we will restart Nicotrol inhalers to use. She is again reminded on the negative side effects of ongoing neck nicotine use.

## 2012-06-20 DIAGNOSIS — L819 Disorder of pigmentation, unspecified: Secondary | ICD-10-CM | POA: Diagnosis not present

## 2012-06-20 DIAGNOSIS — L821 Other seborrheic keratosis: Secondary | ICD-10-CM | POA: Diagnosis not present

## 2012-06-21 LAB — HM MAMMOGRAPHY: HM Mammogram: NORMAL

## 2012-07-10 ENCOUNTER — Other Ambulatory Visit (INDEPENDENT_AMBULATORY_CARE_PROVIDER_SITE_OTHER): Payer: Medicare Other | Admitting: *Deleted

## 2012-07-10 DIAGNOSIS — E039 Hypothyroidism, unspecified: Secondary | ICD-10-CM

## 2012-07-10 DIAGNOSIS — E785 Hyperlipidemia, unspecified: Secondary | ICD-10-CM | POA: Diagnosis not present

## 2012-07-10 LAB — LIPID PANEL
Total CHOL/HDL Ratio: 3
VLDL: 41.4 mg/dL — ABNORMAL HIGH (ref 0.0–40.0)

## 2012-07-10 LAB — COMPREHENSIVE METABOLIC PANEL
ALT: 22 U/L (ref 0–35)
AST: 28 U/L (ref 0–37)
Alkaline Phosphatase: 58 U/L (ref 39–117)
Sodium: 135 mEq/L (ref 135–145)
Total Bilirubin: 0.8 mg/dL (ref 0.3–1.2)
Total Protein: 6.9 g/dL (ref 6.0–8.3)

## 2012-07-10 LAB — LDL CHOLESTEROL, DIRECT: Direct LDL: 89.1 mg/dL

## 2012-07-11 ENCOUNTER — Other Ambulatory Visit: Payer: Self-pay | Admitting: Internal Medicine

## 2012-07-11 DIAGNOSIS — E039 Hypothyroidism, unspecified: Secondary | ICD-10-CM

## 2012-07-11 LAB — T4, FREE: Free T4: 1.35 ng/dL (ref 0.60–1.60)

## 2012-07-11 MED ORDER — LEVOTHYROXINE SODIUM 112 MCG PO TABS: 112.0000 ug | ORAL_TABLET | Freq: Every day | ORAL | Status: DC

## 2012-07-12 ENCOUNTER — Other Ambulatory Visit: Payer: Medicare Other

## 2012-07-14 DIAGNOSIS — M5430 Sciatica, unspecified side: Secondary | ICD-10-CM | POA: Diagnosis not present

## 2012-07-14 DIAGNOSIS — M5137 Other intervertebral disc degeneration, lumbosacral region: Secondary | ICD-10-CM | POA: Diagnosis not present

## 2012-07-14 DIAGNOSIS — M531 Cervicobrachial syndrome: Secondary | ICD-10-CM | POA: Diagnosis not present

## 2012-07-14 DIAGNOSIS — M999 Biomechanical lesion, unspecified: Secondary | ICD-10-CM | POA: Diagnosis not present

## 2012-07-14 DIAGNOSIS — M9981 Other biomechanical lesions of cervical region: Secondary | ICD-10-CM | POA: Diagnosis not present

## 2012-07-16 DIAGNOSIS — M999 Biomechanical lesion, unspecified: Secondary | ICD-10-CM | POA: Diagnosis not present

## 2012-07-16 DIAGNOSIS — M9981 Other biomechanical lesions of cervical region: Secondary | ICD-10-CM | POA: Diagnosis not present

## 2012-07-16 DIAGNOSIS — M531 Cervicobrachial syndrome: Secondary | ICD-10-CM | POA: Diagnosis not present

## 2012-07-16 DIAGNOSIS — M5137 Other intervertebral disc degeneration, lumbosacral region: Secondary | ICD-10-CM | POA: Diagnosis not present

## 2012-07-16 DIAGNOSIS — M5430 Sciatica, unspecified side: Secondary | ICD-10-CM | POA: Diagnosis not present

## 2012-07-17 DIAGNOSIS — M5137 Other intervertebral disc degeneration, lumbosacral region: Secondary | ICD-10-CM | POA: Diagnosis not present

## 2012-07-17 DIAGNOSIS — M531 Cervicobrachial syndrome: Secondary | ICD-10-CM | POA: Diagnosis not present

## 2012-07-17 DIAGNOSIS — M9981 Other biomechanical lesions of cervical region: Secondary | ICD-10-CM | POA: Diagnosis not present

## 2012-07-17 DIAGNOSIS — M5430 Sciatica, unspecified side: Secondary | ICD-10-CM | POA: Diagnosis not present

## 2012-07-17 DIAGNOSIS — M999 Biomechanical lesion, unspecified: Secondary | ICD-10-CM | POA: Diagnosis not present

## 2012-07-19 ENCOUNTER — Encounter: Payer: Self-pay | Admitting: Internal Medicine

## 2012-07-19 ENCOUNTER — Ambulatory Visit (INDEPENDENT_AMBULATORY_CARE_PROVIDER_SITE_OTHER): Payer: Medicare Other | Admitting: Internal Medicine

## 2012-07-19 VITALS — BP 130/70 | HR 60 | Temp 98.2°F | Ht 66.0 in | Wt 171.8 lb

## 2012-07-19 DIAGNOSIS — I2510 Atherosclerotic heart disease of native coronary artery without angina pectoris: Secondary | ICD-10-CM | POA: Diagnosis not present

## 2012-07-19 DIAGNOSIS — Z720 Tobacco use: Secondary | ICD-10-CM

## 2012-07-19 DIAGNOSIS — F419 Anxiety disorder, unspecified: Secondary | ICD-10-CM

## 2012-07-19 DIAGNOSIS — I10 Essential (primary) hypertension: Secondary | ICD-10-CM

## 2012-07-19 DIAGNOSIS — J449 Chronic obstructive pulmonary disease, unspecified: Secondary | ICD-10-CM | POA: Diagnosis not present

## 2012-07-19 DIAGNOSIS — E785 Hyperlipidemia, unspecified: Secondary | ICD-10-CM

## 2012-07-19 DIAGNOSIS — F17200 Nicotine dependence, unspecified, uncomplicated: Secondary | ICD-10-CM

## 2012-07-19 DIAGNOSIS — F411 Generalized anxiety disorder: Secondary | ICD-10-CM

## 2012-07-19 MED ORDER — METOPROLOL TARTRATE 100 MG PO TABS: 100.0000 mg | ORAL_TABLET | Freq: Two times a day (BID) | ORAL | Status: DC

## 2012-07-19 NOTE — Assessment & Plan Note (Signed)
Symptoms of anxiety recently exacerbated by ongoing issues with finances. Offered support today. We'll continue citalopram. Discussed potentially increasing dose to 40 mg but patient would like to hold off on this for now. Follow up 3 months.

## 2012-07-19 NOTE — Assessment & Plan Note (Signed)
Blood pressure well-controlled on current medications. No changes made today. Follow up 3 months.

## 2012-07-19 NOTE — Progress Notes (Signed)
Subjective:    Patient ID: Jodi Beasley, female    DOB: 01/01/1946, 66 y.o.   MRN: [id number]  HPI 66 year old female with history of COPD, tobacco abuse, coronary artery disease, hypertension, hypothyroidism present for followup. She reports she is generally been feeling well. She reports that her breathing has improved compared to earlier this year when she had several exacerbations with bronchitis. She reports full compliance with her inhalers and other medications. She continues to smoke. She notes that recently anxiety levels have been increased because of stress about finances and decreased income. She's been looking for a second part-time job. She continues to take her citalopram to help with anxiety and reports some improvement with this. In the past, she had tried increasing citalopram dose to 40 mg but had difficulty tolerating because of some confusion.  Outpatient Encounter Prescriptions as of 07/19/2012  Medication Sig Dispense Refill  . albuterol (PROAIR HFA) 108 (90 BASE) MCG/ACT inhaler Inhale 2 puffs into the lungs every 6 (six) hours as needed.      Marland Kitchen aspirin 81 MG tablet Take 81 mg by mouth daily.      Marland Kitchen azelastine (ASTELIN) 137 MCG/SPRAY nasal spray Place 1 spray into the nose 2 (two) times daily. Use in each nostril as directed  30 mL  6  . b complex vitamins tablet Take 1 tablet by mouth daily.      . budesonide-formoterol (SYMBICORT) 160-4.5 MCG/ACT inhaler Inhale 2 puffs into the lungs 2 (two) times daily.  1 Inhaler  3  . carisoprodol (SOMA) 350 MG tablet Take 350 mg by mouth 2 (two) times daily.      . citalopram (CELEXA) 20 MG tablet Take 20 mg by mouth daily.      . cloNIDine (CATAPRES) 0.1 MG tablet Take 0.1 mg by mouth daily as needed.      . Coenzyme Q-10 400 MG CAPS Take 400 mg by mouth 2 (two) times daily.      . fluticasone (FLONASE) 50 MCG/ACT nasal spray Place 2 sprays into the nose daily as needed.       . Glucosamine-Chondroit-Vit C-Mn (GLUCOSAMINE CHONDR 1500  COMPLX PO) Take 1 tablet by mouth daily.      Marland Kitchen levothyroxine (SYNTHROID, LEVOTHROID) 112 MCG tablet Take 1 tablet (112 mcg total) by mouth daily.  30 tablet  3  . metoprolol (LOPRESSOR) 100 MG tablet Take 1 tablet (100 mg total) by mouth 2 (two) times daily.  60 tablet  6  . Multiple Vitamin (MULTIVITAMIN) capsule Take 1 capsule by mouth daily.      . Nicotine (NICOTROL NS) 10 MG/ML SOLN One puff every 2-4 hours as needed  2 Bottle  3  . nitroGLYCERIN (NITROSTAT) 0.4 MG SL tablet Place 0.4 mg under the tongue every 5 (five) minutes as needed.      . Omega-3 Fatty Acids (FISH OIL) 1200 MG CAPS Take 1 capsule by mouth daily.      Marland Kitchen omeprazole (PRILOSEC) 20 MG capsule Take 20 mg by mouth daily.      . Potassium Gluconate 550 MG TABS Take 1 tablet by mouth daily.      . rosuvastatin (CRESTOR) 40 MG tablet Take 40 mg by mouth daily.      Marland Kitchen DISCONTD: metoprolol (LOPRESSOR) 100 MG tablet Take 100 mg by mouth 2 (two) times daily.        Review of Systems  Constitutional: Negative for fever, chills, appetite change, fatigue and unexpected weight change.  HENT: Negative for  ear pain, congestion, sore throat, trouble swallowing, neck pain, voice change and sinus pressure.   Eyes: Negative for visual disturbance.  Respiratory: Negative for cough, shortness of breath, wheezing and stridor.   Cardiovascular: Negative for chest pain, palpitations and leg swelling.  Gastrointestinal: Negative for nausea, vomiting, abdominal pain, diarrhea, constipation, blood in stool, abdominal distention and anal bleeding.  Genitourinary: Negative for dysuria and flank pain.  Musculoskeletal: Negative for myalgias, arthralgias and gait problem.  Skin: Negative for color change and rash.  Neurological: Negative for dizziness and headaches.  Hematological: Negative for adenopathy. Does not bruise/bleed easily.  Psychiatric/Behavioral: Positive for dysphoric mood. Negative for suicidal ideas and disturbed wake/sleep cycle.  The patient is nervous/anxious.    BP 130/70  Pulse 60  Temp 98.2 F (36.8 C) (Oral)  Ht 5\' 6"  (1.676 m)  Wt 171 lb 12 oz (77.905 kg)  BMI 27.72 kg/m2  SpO2 97%     Objective:   Physical Exam  Constitutional: She is oriented to person, place, and time. She appears well-developed and well-nourished. No distress.  HENT:  Head: Normocephalic and atraumatic.  Right Ear: External ear normal.  Left Ear: External ear normal.  Nose: Nose normal.  Mouth/Throat: Oropharynx is clear and moist. No oropharyngeal exudate.  Eyes: Conjunctivae are normal. Pupils are equal, round, and reactive to light. Right eye exhibits no discharge. Left eye exhibits no discharge. No scleral icterus.  Neck: Normal range of motion. Neck supple. No tracheal deviation present. No thyromegaly present.  Cardiovascular: Normal rate, regular rhythm, normal heart sounds and intact distal pulses.  Exam reveals no gallop and no friction rub.   No murmur heard. Pulmonary/Chest: Effort normal. No accessory muscle usage. Not tachypneic. No respiratory distress. She has decreased breath sounds (prolonged expiration). She has no wheezes. She has no rales. She exhibits no tenderness.  Musculoskeletal: Normal range of motion. She exhibits no edema and no tenderness.  Lymphadenopathy:    She has no cervical adenopathy.  Neurological: She is alert and oriented to person, place, and time. No cranial nerve deficit. She exhibits normal muscle tone. Coordination normal.  Skin: Skin is warm and dry. No rash noted. She is not diaphoretic. No erythema. No pallor.  Psychiatric: She has a normal mood and affect. Her behavior is normal. Judgment and thought content normal.          Assessment & Plan:

## 2012-07-19 NOTE — Assessment & Plan Note (Signed)
Reviewed recent lipids today. LDL is above goal at 89. Encouraged better compliance with diet high in fiber and low in saturated fat. We'll continue Crestor 40 mg daily. Consider adding Zetia in the future if LDL continues to be above goal. Followup 3 months.

## 2012-07-19 NOTE — Assessment & Plan Note (Signed)
Symptoms currently stable on medications. Will continue Symbicort and albuterol as needed. Encourage smoking cessation.

## 2012-07-19 NOTE — Assessment & Plan Note (Signed)
Currently asymptomatic. Will continue medical management with blood pressure control, statin, and aspirin.

## 2012-07-19 NOTE — Assessment & Plan Note (Signed)
Patient aware of risk of ongoing smoking. Encourage smoking cessation.

## 2012-07-20 ENCOUNTER — Ambulatory Visit: Payer: Medicare Other | Admitting: Internal Medicine

## 2012-07-20 DIAGNOSIS — M5430 Sciatica, unspecified side: Secondary | ICD-10-CM | POA: Diagnosis not present

## 2012-07-20 DIAGNOSIS — M9981 Other biomechanical lesions of cervical region: Secondary | ICD-10-CM | POA: Diagnosis not present

## 2012-07-20 DIAGNOSIS — M531 Cervicobrachial syndrome: Secondary | ICD-10-CM | POA: Diagnosis not present

## 2012-07-20 DIAGNOSIS — M999 Biomechanical lesion, unspecified: Secondary | ICD-10-CM | POA: Diagnosis not present

## 2012-07-20 DIAGNOSIS — M5137 Other intervertebral disc degeneration, lumbosacral region: Secondary | ICD-10-CM | POA: Diagnosis not present

## 2012-07-23 DIAGNOSIS — M999 Biomechanical lesion, unspecified: Secondary | ICD-10-CM | POA: Diagnosis not present

## 2012-07-23 DIAGNOSIS — M5137 Other intervertebral disc degeneration, lumbosacral region: Secondary | ICD-10-CM | POA: Diagnosis not present

## 2012-07-23 DIAGNOSIS — M531 Cervicobrachial syndrome: Secondary | ICD-10-CM | POA: Diagnosis not present

## 2012-07-23 DIAGNOSIS — M5430 Sciatica, unspecified side: Secondary | ICD-10-CM | POA: Diagnosis not present

## 2012-07-23 DIAGNOSIS — M9981 Other biomechanical lesions of cervical region: Secondary | ICD-10-CM | POA: Diagnosis not present

## 2012-07-24 DIAGNOSIS — M531 Cervicobrachial syndrome: Secondary | ICD-10-CM | POA: Diagnosis not present

## 2012-07-24 DIAGNOSIS — M5137 Other intervertebral disc degeneration, lumbosacral region: Secondary | ICD-10-CM | POA: Diagnosis not present

## 2012-07-24 DIAGNOSIS — M9981 Other biomechanical lesions of cervical region: Secondary | ICD-10-CM | POA: Diagnosis not present

## 2012-07-24 DIAGNOSIS — M999 Biomechanical lesion, unspecified: Secondary | ICD-10-CM | POA: Diagnosis not present

## 2012-07-24 DIAGNOSIS — M5430 Sciatica, unspecified side: Secondary | ICD-10-CM | POA: Diagnosis not present

## 2012-07-27 DIAGNOSIS — M9981 Other biomechanical lesions of cervical region: Secondary | ICD-10-CM | POA: Diagnosis not present

## 2012-07-27 DIAGNOSIS — M5430 Sciatica, unspecified side: Secondary | ICD-10-CM | POA: Diagnosis not present

## 2012-07-27 DIAGNOSIS — M999 Biomechanical lesion, unspecified: Secondary | ICD-10-CM | POA: Diagnosis not present

## 2012-07-27 DIAGNOSIS — M531 Cervicobrachial syndrome: Secondary | ICD-10-CM | POA: Diagnosis not present

## 2012-07-27 DIAGNOSIS — M5137 Other intervertebral disc degeneration, lumbosacral region: Secondary | ICD-10-CM | POA: Diagnosis not present

## 2012-07-30 DIAGNOSIS — M5430 Sciatica, unspecified side: Secondary | ICD-10-CM | POA: Diagnosis not present

## 2012-07-30 DIAGNOSIS — M9981 Other biomechanical lesions of cervical region: Secondary | ICD-10-CM | POA: Diagnosis not present

## 2012-07-30 DIAGNOSIS — M999 Biomechanical lesion, unspecified: Secondary | ICD-10-CM | POA: Diagnosis not present

## 2012-07-30 DIAGNOSIS — M5137 Other intervertebral disc degeneration, lumbosacral region: Secondary | ICD-10-CM | POA: Diagnosis not present

## 2012-07-30 DIAGNOSIS — M531 Cervicobrachial syndrome: Secondary | ICD-10-CM | POA: Diagnosis not present

## 2012-07-31 DIAGNOSIS — M5430 Sciatica, unspecified side: Secondary | ICD-10-CM | POA: Diagnosis not present

## 2012-07-31 DIAGNOSIS — M9981 Other biomechanical lesions of cervical region: Secondary | ICD-10-CM | POA: Diagnosis not present

## 2012-07-31 DIAGNOSIS — M999 Biomechanical lesion, unspecified: Secondary | ICD-10-CM | POA: Diagnosis not present

## 2012-07-31 DIAGNOSIS — M5137 Other intervertebral disc degeneration, lumbosacral region: Secondary | ICD-10-CM | POA: Diagnosis not present

## 2012-07-31 DIAGNOSIS — M531 Cervicobrachial syndrome: Secondary | ICD-10-CM | POA: Diagnosis not present

## 2012-08-03 ENCOUNTER — Other Ambulatory Visit: Payer: Self-pay | Admitting: *Deleted

## 2012-08-03 MED ORDER — CITALOPRAM HYDROBROMIDE 20 MG PO TABS: 20.0000 mg | ORAL_TABLET | Freq: Every day | ORAL | Status: DC

## 2012-08-03 NOTE — Telephone Encounter (Signed)
Rx sent to pharmacy patient advised via telephone.

## 2012-08-07 DIAGNOSIS — M5430 Sciatica, unspecified side: Secondary | ICD-10-CM | POA: Diagnosis not present

## 2012-08-07 DIAGNOSIS — M531 Cervicobrachial syndrome: Secondary | ICD-10-CM | POA: Diagnosis not present

## 2012-08-07 DIAGNOSIS — M9981 Other biomechanical lesions of cervical region: Secondary | ICD-10-CM | POA: Diagnosis not present

## 2012-08-07 DIAGNOSIS — M5137 Other intervertebral disc degeneration, lumbosacral region: Secondary | ICD-10-CM | POA: Diagnosis not present

## 2012-08-07 DIAGNOSIS — M999 Biomechanical lesion, unspecified: Secondary | ICD-10-CM | POA: Diagnosis not present

## 2012-08-09 DIAGNOSIS — M5430 Sciatica, unspecified side: Secondary | ICD-10-CM | POA: Diagnosis not present

## 2012-08-09 DIAGNOSIS — M531 Cervicobrachial syndrome: Secondary | ICD-10-CM | POA: Diagnosis not present

## 2012-08-09 DIAGNOSIS — M999 Biomechanical lesion, unspecified: Secondary | ICD-10-CM | POA: Diagnosis not present

## 2012-08-09 DIAGNOSIS — M9981 Other biomechanical lesions of cervical region: Secondary | ICD-10-CM | POA: Diagnosis not present

## 2012-08-09 DIAGNOSIS — M5137 Other intervertebral disc degeneration, lumbosacral region: Secondary | ICD-10-CM | POA: Diagnosis not present

## 2012-08-10 DIAGNOSIS — M9981 Other biomechanical lesions of cervical region: Secondary | ICD-10-CM | POA: Diagnosis not present

## 2012-08-10 DIAGNOSIS — M531 Cervicobrachial syndrome: Secondary | ICD-10-CM | POA: Diagnosis not present

## 2012-08-10 DIAGNOSIS — M5430 Sciatica, unspecified side: Secondary | ICD-10-CM | POA: Diagnosis not present

## 2012-08-10 DIAGNOSIS — M999 Biomechanical lesion, unspecified: Secondary | ICD-10-CM | POA: Diagnosis not present

## 2012-08-10 DIAGNOSIS — M5137 Other intervertebral disc degeneration, lumbosacral region: Secondary | ICD-10-CM | POA: Diagnosis not present

## 2012-08-14 DIAGNOSIS — M5137 Other intervertebral disc degeneration, lumbosacral region: Secondary | ICD-10-CM | POA: Diagnosis not present

## 2012-08-14 DIAGNOSIS — M531 Cervicobrachial syndrome: Secondary | ICD-10-CM | POA: Diagnosis not present

## 2012-08-14 DIAGNOSIS — Z1231 Encounter for screening mammogram for malignant neoplasm of breast: Secondary | ICD-10-CM | POA: Diagnosis not present

## 2012-08-14 DIAGNOSIS — M999 Biomechanical lesion, unspecified: Secondary | ICD-10-CM | POA: Diagnosis not present

## 2012-08-14 DIAGNOSIS — M5430 Sciatica, unspecified side: Secondary | ICD-10-CM | POA: Diagnosis not present

## 2012-08-14 DIAGNOSIS — M9981 Other biomechanical lesions of cervical region: Secondary | ICD-10-CM | POA: Diagnosis not present

## 2012-08-17 DIAGNOSIS — M999 Biomechanical lesion, unspecified: Secondary | ICD-10-CM | POA: Diagnosis not present

## 2012-08-17 DIAGNOSIS — M9981 Other biomechanical lesions of cervical region: Secondary | ICD-10-CM | POA: Diagnosis not present

## 2012-08-17 DIAGNOSIS — M5430 Sciatica, unspecified side: Secondary | ICD-10-CM | POA: Diagnosis not present

## 2012-08-17 DIAGNOSIS — M531 Cervicobrachial syndrome: Secondary | ICD-10-CM | POA: Diagnosis not present

## 2012-08-17 DIAGNOSIS — M5137 Other intervertebral disc degeneration, lumbosacral region: Secondary | ICD-10-CM | POA: Diagnosis not present

## 2012-08-20 DIAGNOSIS — M9981 Other biomechanical lesions of cervical region: Secondary | ICD-10-CM | POA: Diagnosis not present

## 2012-08-20 DIAGNOSIS — M531 Cervicobrachial syndrome: Secondary | ICD-10-CM | POA: Diagnosis not present

## 2012-08-20 DIAGNOSIS — M999 Biomechanical lesion, unspecified: Secondary | ICD-10-CM | POA: Diagnosis not present

## 2012-08-20 DIAGNOSIS — M5430 Sciatica, unspecified side: Secondary | ICD-10-CM | POA: Diagnosis not present

## 2012-08-20 DIAGNOSIS — M5137 Other intervertebral disc degeneration, lumbosacral region: Secondary | ICD-10-CM | POA: Diagnosis not present

## 2012-08-22 LAB — HM MAMMOGRAPHY

## 2012-08-23 DIAGNOSIS — M9981 Other biomechanical lesions of cervical region: Secondary | ICD-10-CM | POA: Diagnosis not present

## 2012-08-23 DIAGNOSIS — M5137 Other intervertebral disc degeneration, lumbosacral region: Secondary | ICD-10-CM | POA: Diagnosis not present

## 2012-08-23 DIAGNOSIS — M999 Biomechanical lesion, unspecified: Secondary | ICD-10-CM | POA: Diagnosis not present

## 2012-08-23 DIAGNOSIS — M5430 Sciatica, unspecified side: Secondary | ICD-10-CM | POA: Diagnosis not present

## 2012-08-23 DIAGNOSIS — M531 Cervicobrachial syndrome: Secondary | ICD-10-CM | POA: Diagnosis not present

## 2012-08-28 DIAGNOSIS — M5430 Sciatica, unspecified side: Secondary | ICD-10-CM | POA: Diagnosis not present

## 2012-08-28 DIAGNOSIS — M999 Biomechanical lesion, unspecified: Secondary | ICD-10-CM | POA: Diagnosis not present

## 2012-08-28 DIAGNOSIS — M9981 Other biomechanical lesions of cervical region: Secondary | ICD-10-CM | POA: Diagnosis not present

## 2012-08-28 DIAGNOSIS — M5137 Other intervertebral disc degeneration, lumbosacral region: Secondary | ICD-10-CM | POA: Diagnosis not present

## 2012-08-28 DIAGNOSIS — M531 Cervicobrachial syndrome: Secondary | ICD-10-CM | POA: Diagnosis not present

## 2012-09-03 DIAGNOSIS — M5430 Sciatica, unspecified side: Secondary | ICD-10-CM | POA: Diagnosis not present

## 2012-09-03 DIAGNOSIS — M999 Biomechanical lesion, unspecified: Secondary | ICD-10-CM | POA: Diagnosis not present

## 2012-09-03 DIAGNOSIS — M5137 Other intervertebral disc degeneration, lumbosacral region: Secondary | ICD-10-CM | POA: Diagnosis not present

## 2012-09-03 DIAGNOSIS — M9981 Other biomechanical lesions of cervical region: Secondary | ICD-10-CM | POA: Diagnosis not present

## 2012-09-03 DIAGNOSIS — M531 Cervicobrachial syndrome: Secondary | ICD-10-CM | POA: Diagnosis not present

## 2012-09-06 DIAGNOSIS — M5137 Other intervertebral disc degeneration, lumbosacral region: Secondary | ICD-10-CM | POA: Diagnosis not present

## 2012-09-06 DIAGNOSIS — M5430 Sciatica, unspecified side: Secondary | ICD-10-CM | POA: Diagnosis not present

## 2012-09-06 DIAGNOSIS — M9981 Other biomechanical lesions of cervical region: Secondary | ICD-10-CM | POA: Diagnosis not present

## 2012-09-06 DIAGNOSIS — M531 Cervicobrachial syndrome: Secondary | ICD-10-CM | POA: Diagnosis not present

## 2012-09-06 DIAGNOSIS — M999 Biomechanical lesion, unspecified: Secondary | ICD-10-CM | POA: Diagnosis not present

## 2012-09-07 ENCOUNTER — Telehealth: Payer: Self-pay | Admitting: Internal Medicine

## 2012-09-07 NOTE — Telephone Encounter (Signed)
We have Crestor 20mg  samples available but no Symbicort.  Patient advised via telephone.

## 2012-09-07 NOTE — Telephone Encounter (Signed)
Pt came by today wanting to get samples of symbicort and crestor

## 2012-09-11 ENCOUNTER — Encounter: Payer: Self-pay | Admitting: Internal Medicine

## 2012-09-12 DIAGNOSIS — M9981 Other biomechanical lesions of cervical region: Secondary | ICD-10-CM | POA: Diagnosis not present

## 2012-09-12 DIAGNOSIS — M5137 Other intervertebral disc degeneration, lumbosacral region: Secondary | ICD-10-CM | POA: Diagnosis not present

## 2012-09-12 DIAGNOSIS — M999 Biomechanical lesion, unspecified: Secondary | ICD-10-CM | POA: Diagnosis not present

## 2012-09-12 DIAGNOSIS — M5430 Sciatica, unspecified side: Secondary | ICD-10-CM | POA: Diagnosis not present

## 2012-09-12 DIAGNOSIS — M531 Cervicobrachial syndrome: Secondary | ICD-10-CM | POA: Diagnosis not present

## 2012-09-13 ENCOUNTER — Other Ambulatory Visit: Payer: Self-pay | Admitting: *Deleted

## 2012-09-13 DIAGNOSIS — J449 Chronic obstructive pulmonary disease, unspecified: Secondary | ICD-10-CM

## 2012-09-13 MED ORDER — BUDESONIDE-FORMOTEROL FUMARATE 160-4.5 MCG/ACT IN AERO: 2.0000 | INHALATION_SPRAY | Freq: Two times a day (BID) | RESPIRATORY_TRACT | Status: DC

## 2012-09-18 DIAGNOSIS — M5137 Other intervertebral disc degeneration, lumbosacral region: Secondary | ICD-10-CM | POA: Diagnosis not present

## 2012-09-18 DIAGNOSIS — M531 Cervicobrachial syndrome: Secondary | ICD-10-CM | POA: Diagnosis not present

## 2012-09-18 DIAGNOSIS — M9981 Other biomechanical lesions of cervical region: Secondary | ICD-10-CM | POA: Diagnosis not present

## 2012-09-18 DIAGNOSIS — M999 Biomechanical lesion, unspecified: Secondary | ICD-10-CM | POA: Diagnosis not present

## 2012-09-18 DIAGNOSIS — M5430 Sciatica, unspecified side: Secondary | ICD-10-CM | POA: Diagnosis not present

## 2012-09-25 DIAGNOSIS — M531 Cervicobrachial syndrome: Secondary | ICD-10-CM | POA: Diagnosis not present

## 2012-09-25 DIAGNOSIS — M5137 Other intervertebral disc degeneration, lumbosacral region: Secondary | ICD-10-CM | POA: Diagnosis not present

## 2012-09-25 DIAGNOSIS — M9981 Other biomechanical lesions of cervical region: Secondary | ICD-10-CM | POA: Diagnosis not present

## 2012-09-25 DIAGNOSIS — M999 Biomechanical lesion, unspecified: Secondary | ICD-10-CM | POA: Diagnosis not present

## 2012-09-25 DIAGNOSIS — M5430 Sciatica, unspecified side: Secondary | ICD-10-CM | POA: Diagnosis not present

## 2012-10-01 DIAGNOSIS — M531 Cervicobrachial syndrome: Secondary | ICD-10-CM | POA: Diagnosis not present

## 2012-10-01 DIAGNOSIS — M5137 Other intervertebral disc degeneration, lumbosacral region: Secondary | ICD-10-CM | POA: Diagnosis not present

## 2012-10-01 DIAGNOSIS — M9981 Other biomechanical lesions of cervical region: Secondary | ICD-10-CM | POA: Diagnosis not present

## 2012-10-01 DIAGNOSIS — M5430 Sciatica, unspecified side: Secondary | ICD-10-CM | POA: Diagnosis not present

## 2012-10-01 DIAGNOSIS — M999 Biomechanical lesion, unspecified: Secondary | ICD-10-CM | POA: Diagnosis not present

## 2012-10-02 ENCOUNTER — Other Ambulatory Visit: Payer: Self-pay | Admitting: Internal Medicine

## 2012-10-02 ENCOUNTER — Ambulatory Visit: Payer: Medicare Other | Admitting: Pulmonary Disease

## 2012-10-12 DIAGNOSIS — M9981 Other biomechanical lesions of cervical region: Secondary | ICD-10-CM | POA: Diagnosis not present

## 2012-10-12 DIAGNOSIS — M5430 Sciatica, unspecified side: Secondary | ICD-10-CM | POA: Diagnosis not present

## 2012-10-12 DIAGNOSIS — M999 Biomechanical lesion, unspecified: Secondary | ICD-10-CM | POA: Diagnosis not present

## 2012-10-12 DIAGNOSIS — M5137 Other intervertebral disc degeneration, lumbosacral region: Secondary | ICD-10-CM | POA: Diagnosis not present

## 2012-10-12 DIAGNOSIS — M531 Cervicobrachial syndrome: Secondary | ICD-10-CM | POA: Diagnosis not present

## 2012-10-16 ENCOUNTER — Encounter: Payer: Self-pay | Admitting: Pulmonary Disease

## 2012-10-16 ENCOUNTER — Ambulatory Visit (INDEPENDENT_AMBULATORY_CARE_PROVIDER_SITE_OTHER): Payer: Medicare Other | Admitting: Pulmonary Disease

## 2012-10-16 VITALS — BP 118/76 | HR 70 | Temp 98.0°F | Ht 66.0 in | Wt 176.4 lb

## 2012-10-16 DIAGNOSIS — Z720 Tobacco use: Secondary | ICD-10-CM

## 2012-10-16 DIAGNOSIS — F17200 Nicotine dependence, unspecified, uncomplicated: Secondary | ICD-10-CM

## 2012-10-16 DIAGNOSIS — J449 Chronic obstructive pulmonary disease, unspecified: Secondary | ICD-10-CM

## 2012-10-16 MED ORDER — BUDESONIDE-FORMOTEROL FUMARATE 80-4.5 MCG/ACT IN AERO: 2.0000 | INHALATION_SPRAY | Freq: Two times a day (BID) | RESPIRATORY_TRACT | Status: DC

## 2012-10-16 MED ORDER — NICOTINE 10 MG IN INHA: 1.0000 | RESPIRATORY_TRACT | Status: DC | PRN

## 2012-10-16 NOTE — Assessment & Plan Note (Addendum)
Jodi Beasley seems to be doing better with Symbicort and was [place]. We need to change her dose to a "COPD dose" rather than the "asthma dose" that she is taking right now. She desperately needs to quit smoking, see below  Plan: -She refused a flu shot today and social get it next week -Change Symbicort to 80/4.5, 2 puffs twice a day -Continue Spiriva -Quit smoking, quit smoking, quit smoking. -If her chest congestion does not improve with the increased doses Symbicort she's going to call me by the end of the week and we may call in an antibiotic.

## 2012-10-16 NOTE — Patient Instructions (Signed)
Use the new dose of Symbicort (80/4.5) two puffs twice a day no matter how you feel. Increase your fluid intake and call us if your chest congestion has not improved by the end of the week.  If you have increasing shortness of breath or sputum production you may need an antibiotic.  Use the nicotrol inhaler as written  Quit smoking  Get a flu shot next week with Dr. Dan Humphreys  We will see you back in 3 months or sooner if needed  If you decide you want to undergo lung cancer screening, let us know.

## 2012-10-16 NOTE — Assessment & Plan Note (Addendum)
We discussed this problem again at length. She continues to show very little desire to want to quit smoking. After the last visit she did not call us to tell us that she wanted a different nicotine delivery device, so she hasn't been using anything to quit smoking since then.  We talked about lung cancer screening at length today. She adamantly refuses to do this. She's had multiple family members die of lung cancer and she states that if she were to be diagnosed she would not want any treatment. I explained to her that the benefit of lung cancer screening is that the malignancy (if present) could potentially be discovered at a early enough stage to be operable and potentially curable. She still refuses to have this.  Plan: -nicotrol inhaler

## 2012-10-16 NOTE — Progress Notes (Signed)
Subjective:    Patient ID: Jodi Beasley, female    DOB: 10/30/1946, 66 y.o.   MRN: [id number]  Synopsis: Jodi Beasley established care with the [facility] pulmonary clinic in May 2013 for COPD. She simple spirometry at that time with a ratio of 71%, the flow volume loop consistent with obstruction and an FEV1 of 1.92 L or 77% predicted. She was followed previously by Dr. Alphonzo Grieve, a pulmonologist in the [place], [place] area. She had been treated for pneumonia 3 weeks prior to her initial visit.  She has smoked one half pack of cigarettes per day for 53 years and continues to smoke.  HPI    7/8 ROV -- Jodi Beasley returns to clinic today for evaluation of her COPD. She states that she had an episode of bronchitis in May which improved with treatment with antibiotics. She do not now how to get a hold of [place] so she ended up going to urgent care. Since then she states her symptoms have improved dramatically and she is not having to use her rescue inhaler much. She is using Astelin nasal spray more often than the Nasonex and she says it worked quite well. She really only has her breath in the heat and humidity. She continues to smoke and is frustrated with her inability to quit. She states that nicotine replacement is helped most of the past.   10/16/2012 ROV --Jodi Beasley returns to clinic today for followup on her COPD. She says that she did not like to [place] and that she prefers to take the Symbicort. She currently uses 1 puff in the morning and a second later in the evening. She gets relief of shortness of breath with this. She usually has to use one dose of albuterol before going to bed. When the ragweed was out she was having to use albuterol more often. She has noted an increase in chest congestion in the last few days. She denies fevers, chills, or shortness of breath.  She continues to smoke cigarettes. She is smoking 6-10 cigarettes a day. After the last visit she did not call us to tell  us that the nicotine inhaler we gave her was not to her liking.   She has not had flares of bronchitis since the last visit.  Past Medical History  Diagnosis Date  . Depression   . Chicken pox   . GERD (gastroesophageal reflux disease)   . Hay fever   . History of blood transfusion   . Pneumonia, organism unspecified   . Hypothyroid   . COPD (chronic obstructive pulmonary disease)   . History of syncope 2000    negative EP study  . History Of Hypercholesterolemia   . Arrhythmia   . Hyperlipidemia     intolerance to statins except Crestor.   . Coronary artery disease     NSTEMI in 09/2008. Cath : 80% RCA and 95% first diagonal. PCI and 2 DES placement  RCA and 1 DES to diagonal. Complicated by acute diagonal stent thrombosis . Treated by thrombectomy. Most recent nuclear stress test in 2010 showed no ischemia with normal EF.   Marland Kitchen Hypertension     Review of Systems  Constitutional: Negative for fever, fatigue and unexpected weight change.  HENT: Negative for congestion, rhinorrhea, postnasal drip and sinus pressure.   Respiratory: Positive for shortness of breath. Negative for cough and choking.   Cardiovascular: Negative for chest pain and leg swelling.       Objective:   Physical Exam  Filed Vitals:   10/16/12 1108  BP: 118/76  Pulse: 70  Temp: 98 F (36.7 C)  TempSrc: Oral  Height: 5\' 6"  (1.676 m)  Weight: 176 lb 6.4 oz (80.015 kg)  SpO2: 96%    Gen: well appearing, no acute distress HEENT: NCAT, PERRL, EOMi, OP clear, neck supple without masses;  PULM: CTA B CV: RRR, no mgr, no JVD AB: BS+, soft, nontender, no hsm Ext: warm, no edema, no clubbing, no cyanosis   04/09/2012 Simple Spirometry: Ratio 71%, FEV1 1.92L (77% pred); flow volume loop consistent with obstruction     Assessment & Plan:   Tobacco abuse We discussed this problem again at length. She continues to show very little desire to want to quit smoking. After the last visit she did not call us to  tell us that she wanted a different nicotine delivery device, so she hasn't been using anything to quit smoking since then.  We talked about lung cancer screening at length today. She adamantly refuses to do this. She's had multiple family members die of lung cancer and she states that if she were to be diagnosed she would not want any treatment. I explained to her that the benefit of lung cancer screening is that the malignancy (if present) could potentially be discovered at a early enough stage to be operable and potentially curable. She still refuses to have this.  Plan: -nicotrol inhaler  COPD (chronic obstructive pulmonary disease) with chronic bronchitis Anaily seems to be doing better with Symbicort and was [place]. We need to change her dose to a "COPD dose" rather than the "asthma dose" that she is taking right now. She desperately needs to quit smoking, see below  Plan: -She refused a flu shot today and social get it next week -Change Symbicort to 80/4.5, 2 puffs twice a day -Continue Spiriva -Quit smoking, quit smoking, quit smoking. -If her chest congestion does not improve with the increased doses Symbicort she's going to call me by the end of the week and we may call in an antibiotic.    Updated Medication List Outpatient Encounter Prescriptions as of 10/16/2012  Medication Sig Dispense Refill  . albuterol (PROAIR HFA) 108 (90 BASE) MCG/ACT inhaler Inhale 2 puffs into the lungs every 6 (six) hours as needed.      Marland Kitchen aspirin 81 MG tablet Take 81 mg by mouth daily.      Marland Kitchen azelastine (ASTELIN) 137 MCG/SPRAY nasal spray Place 1 spray into the nose 2 (two) times daily. Use in each nostril as directed  30 mL  6  . b complex vitamins tablet Take 1 tablet by mouth daily.      . budesonide-formoterol (SYMBICORT) 160-4.5 MCG/ACT inhaler Inhale 2 puffs into the lungs 2 (two) times daily.  1 Inhaler  4  . carisoprodol (SOMA) 350 MG tablet Take 350 mg by mouth 2 (two) times daily.      .  citalopram (CELEXA) 20 MG tablet Take 1 tablet (20 mg total) by mouth daily.  30 tablet  3  . cloNIDine (CATAPRES) 0.1 MG tablet Take 0.1 mg by mouth daily as needed.      . Coenzyme Q-10 400 MG CAPS Take 400 mg by mouth 2 (two) times daily.      . CRESTOR 40 MG tablet TAKE 1 TABLET BY MOUTH EVERY DAY AS DIRECTED  30 tablet  0  . fluticasone (FLONASE) 50 MCG/ACT nasal spray Place 2 sprays into the nose daily as needed.       Marland Kitchen  Glucosamine-Chondroit-Vit C-Mn (GLUCOSAMINE CHONDR 1500 COMPLX PO) Take 1 tablet by mouth daily.      Marland Kitchen levothyroxine (SYNTHROID, LEVOTHROID) 112 MCG tablet Take 1 tablet (112 mcg total) by mouth daily.  30 tablet  3  . metoprolol (LOPRESSOR) 100 MG tablet Take 1 tablet (100 mg total) by mouth 2 (two) times daily.  60 tablet  6  . Multiple Vitamin (MULTIVITAMIN) capsule Take 1 capsule by mouth daily.      . Nicotine (NICOTROL NS) 10 MG/ML SOLN One puff every 2-4 hours as needed  2 Bottle  3  . nitroGLYCERIN (NITROSTAT) 0.4 MG SL tablet Place 0.4 mg under the tongue every 5 (five) minutes as needed.      . Omega-3 Fatty Acids (FISH OIL) 1200 MG CAPS Take 1 capsule by mouth daily.      Marland Kitchen omeprazole (PRILOSEC) 20 MG capsule TAKE ONE CAPSULE BY MOUTH EVERY DAY  90 capsule  0  . Potassium Gluconate 550 MG TABS Take 1 tablet by mouth daily.

## 2012-10-18 ENCOUNTER — Other Ambulatory Visit: Payer: Self-pay | Admitting: Internal Medicine

## 2012-10-18 ENCOUNTER — Telehealth: Payer: Self-pay | Admitting: Pulmonary Disease

## 2012-10-18 MED ORDER — DOXYCYCLINE HYCLATE 100 MG PO TABS: 100.0000 mg | ORAL_TABLET | Freq: Two times a day (BID) | ORAL | Status: DC

## 2012-10-18 NOTE — Telephone Encounter (Signed)
Rx for Jodi Beasley called to [facility] pharmacy.

## 2012-10-18 NOTE — Telephone Encounter (Signed)
Will go ahead and forward to doc of the day CDY, please advise thanks!

## 2012-10-18 NOTE — Telephone Encounter (Signed)
Called and spoke with pt Last seen by Dr. Kendrick Fries 10/16/12 She states she has noticed increased cough, wheezing and SOB since last visit Cough is prod with moderate to large amounts of clear to white sputum Denies any f/c/s, CP/chest tightness or other new co's She states was advised per Dr. Kendrick Fries to call if no better for abx  Dr. Kendrick Fries, please advise, thanks! Allergies  Allergen Reactions  . Benzocaine     Tongue swelling  . Darvon (Propoxyphene Hcl)     HA  . Neosporin (Neomycin-Bacitracin Zn-Polymyx)     blisters  . Nicoderm (Nicotine)     arrythmia

## 2012-10-18 NOTE — Telephone Encounter (Signed)
Spoke with Dr. Kendrick Fries He states that we call call in doxy 100 mg bid x 7 days Rx was sent to pharm Pt aware

## 2012-10-22 ENCOUNTER — Encounter: Payer: Self-pay | Admitting: Internal Medicine

## 2012-10-22 ENCOUNTER — Ambulatory Visit (INDEPENDENT_AMBULATORY_CARE_PROVIDER_SITE_OTHER): Payer: Medicare Other | Admitting: Internal Medicine

## 2012-10-22 VITALS — BP 130/80 | HR 65 | Temp 98.2°F | Ht 66.0 in | Wt 176.5 lb

## 2012-10-22 DIAGNOSIS — Z0000 Encounter for general adult medical examination without abnormal findings: Secondary | ICD-10-CM | POA: Diagnosis not present

## 2012-10-22 DIAGNOSIS — Z23 Encounter for immunization: Secondary | ICD-10-CM

## 2012-10-22 DIAGNOSIS — J449 Chronic obstructive pulmonary disease, unspecified: Secondary | ICD-10-CM | POA: Diagnosis not present

## 2012-10-22 DIAGNOSIS — E039 Hypothyroidism, unspecified: Secondary | ICD-10-CM | POA: Diagnosis not present

## 2012-10-22 DIAGNOSIS — F17200 Nicotine dependence, unspecified, uncomplicated: Secondary | ICD-10-CM

## 2012-10-22 DIAGNOSIS — F411 Generalized anxiety disorder: Secondary | ICD-10-CM

## 2012-10-22 DIAGNOSIS — I10 Essential (primary) hypertension: Secondary | ICD-10-CM

## 2012-10-22 DIAGNOSIS — Z720 Tobacco use: Secondary | ICD-10-CM

## 2012-10-22 DIAGNOSIS — F419 Anxiety disorder, unspecified: Secondary | ICD-10-CM

## 2012-10-22 NOTE — Assessment & Plan Note (Signed)
Encouraged smoking cessation, and pt has nicotrol inhaler, but does not feel ready to quit at this time.

## 2012-10-22 NOTE — Assessment & Plan Note (Signed)
General exam including breast exam is normal today. PAP deferred as all previous PAPs normal.  Health maintenance is UTD except for flu vaccine which was given today. Encouraged smoking cessation however pt does not feel ready to quit.  Discussed recent symptoms of depression and option of referral for counseling, however pt would like to hold off for now.  Follow up in 3 months and prn.

## 2012-10-22 NOTE — Assessment & Plan Note (Signed)
BP well controlled on current medications. Will plan to continue. Follow up 3 months and prn.

## 2012-10-22 NOTE — Assessment & Plan Note (Signed)
Discussed recent increased anxiety and depressed mood related to time constraints caring for family members and recent diagnosis of her dog with lymphoma. Discussed setting up counseling, but pt would like to hold off for now. Follow up prn.

## 2012-10-22 NOTE — Assessment & Plan Note (Signed)
Labs in 07/2012 suggested over suppression of TSH. Recommended repeat TSH and Free T4 however pt would like to hold off until next visit. Symptomatically doing well. Continue Synthroid.

## 2012-10-22 NOTE — Progress Notes (Signed)
Subjective:    Patient ID: Jodi Beasley, female    DOB: 12-27-1945, 66 y.o.   MRN: [id number]  HPI The patient is here for annual Medicare wellness examination and management of other chronic and acute problems.   The risk factors are reflected in the social history.  The roster of all physicians providing medical care to patient - is listed in the Snapshot section of the chart.  Activities of daily living:  The patient is 100% independent in all ADLs: dressing, toileting, feeding as well as independent mobility  Home safety : The patient has smoke detectors in the home. They wear seatbelts.  There are no firearms at home. There is no violence in the home.   There is no risks for hepatitis, STDs or HIV. There is history of blood transfusion during cardiac stent placement. They have no travel history to infectious disease endemic areas of the world.  The patient has seen their dentist in the last six month. (Dr. Smitty Cords) They have seen their eye doctor in the last year. (Last seen at [facility]) They admit to slight hearing difficulty with regard to crowded rooms. They have deferred audiologic testing in the last year.  They do not  have excessive sun exposure. Discussed the need for sun protection: hats, long sleeves and use of sunscreen if there is significant sun exposure. Dermatolgist - Dr. Wende Neighbors  Diet: the importance of a healthy diet is discussed. They do have a healthy diet.  The benefits of regular aerobic exercise were discussed. She walks several times per week.  Depression screen: there are no signs or vegative symptoms of depression- irritability, change in appetite, anhedonia, sadness/tearfullness. Several recent stressors, including pet diagnosis with lymphoma. Pt feels she is tolerating well, with support of friends. Not currently interested in counseling.  Cognitive assessment: the patient manages all their financial and personal affairs and is actively engaged.  They could relate day,date,year and events.   HCPOA - sister, Jannette Spanner, and Yvonna Alanis Living Will in Place  The following portions of the patient's history were reviewed and updated as appropriate: allergies, current medications, past family history, past medical history,  past surgical history, past social history  and problem list.  Visual acuity was not assessed per patient preference since she has regular follow up with her ophthalmologist. Hearing and body mass index were assessed and reviewed.   During the course of the visit the patient was educated and counseled about appropriate screening and preventive services including : fall prevention , diabetes screening, nutrition counseling, colorectal cancer screening, and recommended immunizations.     Outpatient Encounter Prescriptions as of 10/22/2012  Medication Sig Dispense Refill  . albuterol (PROAIR HFA) 108 (90 BASE) MCG/ACT inhaler Inhale 2 puffs into the lungs every 6 (six) hours as needed.      Marland Kitchen aspirin 81 MG tablet Take 81 mg by mouth daily.      Marland Kitchen azelastine (ASTELIN) 137 MCG/SPRAY nasal spray Place 1 spray into the nose 2 (two) times daily. Use in each nostril as directed  30 mL  6  . b complex vitamins tablet Take 1 tablet by mouth daily.      . budesonide-formoterol (SYMBICORT) 80-4.5 MCG/ACT inhaler Inhale 2 puffs into the lungs 2 (two) times daily.  1 Inhaler  5  . carisoprodol (SOMA) 350 MG tablet TAKE 1 TABLET TWICE A DAY AS NEEDED  60 tablet  0  . citalopram (CELEXA) 20 MG tablet Take 1 tablet (20 mg total) by  mouth daily.  30 tablet  3  . cloNIDine (CATAPRES) 0.1 MG tablet Take 0.1 mg by mouth daily as needed.      . Coenzyme Q-10 400 MG CAPS Take 400 mg by mouth 2 (two) times daily.      Marland Kitchen conjugated estrogens (PREMARIN) vaginal cream Place 1 g vaginally as needed.      . CRESTOR 40 MG tablet TAKE 1 TABLET BY MOUTH EVERY DAY AS DIRECTED  30 tablet  0  . doxycycline (VIBRA-TABS) 100 MG tablet Take 1 tablet  (100 mg total) by mouth 2 (two) times daily.  14 tablet  0  . fexofenadine (ALLEGRA) 180 MG tablet Take 180 mg by mouth daily.      . fluticasone (FLONASE) 50 MCG/ACT nasal spray Place 2 sprays into the nose daily as needed.       . Glucosamine-Chondroit-Vit C-Mn (GLUCOSAMINE CHONDR 1500 COMPLX PO) Take 1 tablet by mouth daily.      Marland Kitchen levothyroxine (SYNTHROID, LEVOTHROID) 112 MCG tablet Take 1 tablet (112 mcg total) by mouth daily.  30 tablet  3  . metoprolol (LOPRESSOR) 100 MG tablet TAKE 1 TABLET (100 MG TOTAL) BY MOUTH 2 (TWO) TIMES DAILY.  180 tablet  3  . Multiple Vitamin (MULTIVITAMIN) capsule Take 1 capsule by mouth daily.      . nicotine (NICOTROL) 10 MG inhaler Inhale 1 puff into the lungs as needed for smoking cessation.  42 each  0  . nitroGLYCERIN (NITROSTAT) 0.4 MG SL tablet Place 0.4 mg under the tongue every 5 (five) minutes as needed.      . Omega-3 Fatty Acids (FISH OIL) 1200 MG CAPS Take 1 capsule by mouth daily.      Marland Kitchen omeprazole (PRILOSEC) 20 MG capsule TAKE ONE CAPSULE BY MOUTH EVERY DAY  90 capsule  0  . Potassium Gluconate 550 MG TABS Take 1 tablet by mouth 2 (two) times daily.        BP 130/80  Pulse 65  Temp 98.2 F (36.8 C) (Oral)  Ht 5\' 6"  (1.676 m)  Wt 176 lb 8 oz (80.06 kg)  BMI 28.49 kg/m2  SpO2 95%  Review of Systems  Constitutional: Negative for fever, chills, appetite change, fatigue and unexpected weight change.  HENT: Negative for ear pain, congestion, sore throat, trouble swallowing, neck pain, voice change and sinus pressure.   Eyes: Negative for visual disturbance.  Respiratory: Negative for cough, shortness of breath, wheezing and stridor.   Cardiovascular: Negative for chest pain, palpitations and leg swelling.  Gastrointestinal: Negative for nausea, vomiting, abdominal pain, diarrhea, constipation, blood in stool, abdominal distention and anal bleeding.  Genitourinary: Negative for dysuria and flank pain.  Musculoskeletal: Negative for myalgias,  arthralgias and gait problem.  Skin: Negative for color change and rash.  Neurological: Negative for dizziness and headaches.  Hematological: Negative for adenopathy. Does not bruise/bleed easily.  Psychiatric/Behavioral: Positive for dysphoric mood. Negative for suicidal ideas and sleep disturbance. The patient is nervous/anxious.        Objective:   Physical Exam  Constitutional: She is oriented to person, place, and time. She appears well-developed and well-nourished. No distress.  HENT:  Head: Normocephalic and atraumatic.  Right Ear: External ear normal.  Left Ear: External ear normal.  Nose: Nose normal.  Mouth/Throat: Oropharynx is clear and moist. No oropharyngeal exudate.  Eyes: Conjunctivae normal are normal. Pupils are equal, round, and reactive to light. Right eye exhibits no discharge. Left eye exhibits no discharge. No scleral icterus.  Neck: Normal range of motion. Neck supple. No tracheal deviation present. No thyromegaly present.  Cardiovascular: Normal rate, regular rhythm, normal heart sounds and intact distal pulses.  Exam reveals no gallop and no friction rub.   No murmur heard. Pulmonary/Chest: Effort normal. No accessory muscle usage. Not tachypneic. No respiratory distress. She has decreased breath sounds (prolonged expiration). She has no wheezes. She has no rhonchi. She has no rales. She exhibits no tenderness. Right breast exhibits no inverted nipple, no mass, no nipple discharge, no skin change and no tenderness. Left breast exhibits no inverted nipple, no mass, no nipple discharge, no skin change and no tenderness. Breasts are symmetrical.  Abdominal: Soft. Bowel sounds are normal. She exhibits no distension. There is no tenderness.  Musculoskeletal: Normal range of motion. She exhibits no edema and no tenderness.  Lymphadenopathy:    She has no cervical adenopathy.  Neurological: She is alert and oriented to person, place, and time. No cranial nerve deficit.  She exhibits normal muscle tone. Coordination normal.  Skin: Skin is warm and dry. No rash noted. She is not diaphoretic. No erythema. No pallor.  Psychiatric: She has a normal mood and affect. Her behavior is normal. Judgment and thought content normal.          Assessment & Plan:

## 2012-10-24 DIAGNOSIS — M531 Cervicobrachial syndrome: Secondary | ICD-10-CM | POA: Diagnosis not present

## 2012-10-24 DIAGNOSIS — M5137 Other intervertebral disc degeneration, lumbosacral region: Secondary | ICD-10-CM | POA: Diagnosis not present

## 2012-10-24 DIAGNOSIS — M999 Biomechanical lesion, unspecified: Secondary | ICD-10-CM | POA: Diagnosis not present

## 2012-10-24 DIAGNOSIS — M9981 Other biomechanical lesions of cervical region: Secondary | ICD-10-CM | POA: Diagnosis not present

## 2012-10-24 DIAGNOSIS — M5430 Sciatica, unspecified side: Secondary | ICD-10-CM | POA: Diagnosis not present

## 2012-11-06 ENCOUNTER — Other Ambulatory Visit: Payer: Self-pay | Admitting: Internal Medicine

## 2012-11-15 ENCOUNTER — Other Ambulatory Visit: Payer: Self-pay | Admitting: Internal Medicine

## 2012-11-15 NOTE — Telephone Encounter (Signed)
meds filled

## 2012-11-20 DIAGNOSIS — M5137 Other intervertebral disc degeneration, lumbosacral region: Secondary | ICD-10-CM | POA: Diagnosis not present

## 2012-11-20 DIAGNOSIS — M999 Biomechanical lesion, unspecified: Secondary | ICD-10-CM | POA: Diagnosis not present

## 2012-11-20 DIAGNOSIS — M9981 Other biomechanical lesions of cervical region: Secondary | ICD-10-CM | POA: Diagnosis not present

## 2012-11-20 DIAGNOSIS — M5430 Sciatica, unspecified side: Secondary | ICD-10-CM | POA: Diagnosis not present

## 2012-11-20 DIAGNOSIS — M531 Cervicobrachial syndrome: Secondary | ICD-10-CM | POA: Diagnosis not present

## 2012-11-21 ENCOUNTER — Other Ambulatory Visit: Payer: Self-pay | Admitting: Internal Medicine

## 2012-11-21 NOTE — Telephone Encounter (Signed)
Medication filled.  

## 2012-12-03 ENCOUNTER — Other Ambulatory Visit: Payer: Self-pay | Admitting: Internal Medicine

## 2012-12-10 ENCOUNTER — Ambulatory Visit (INDEPENDENT_AMBULATORY_CARE_PROVIDER_SITE_OTHER): Payer: Medicare Other | Admitting: Adult Health

## 2012-12-10 ENCOUNTER — Encounter: Payer: Self-pay | Admitting: Adult Health

## 2012-12-10 VITALS — BP 142/89 | HR 63 | Temp 98.6°F | Resp 18 | Ht 65.0 in | Wt 178.0 lb

## 2012-12-10 DIAGNOSIS — J449 Chronic obstructive pulmonary disease, unspecified: Secondary | ICD-10-CM

## 2012-12-10 MED ORDER — HYDROCODONE-HOMATROPINE 5-1.5 MG/5ML PO SYRP: 5.0000 mL | ORAL_SOLUTION | Freq: Three times a day (TID) | ORAL | Status: DC | PRN

## 2012-12-10 MED ORDER — DOXYCYCLINE HYCLATE 100 MG PO TABS: 100.0000 mg | ORAL_TABLET | Freq: Two times a day (BID) | ORAL | Status: DC

## 2012-12-10 NOTE — Patient Instructions (Addendum)
  Continue with symbicort and flonase.  Start doxycycline today and take twice daily for 7 days.  Drink fluids to stay hydrated.  I will give you a prescription for Hycodan for your cough. This medication can make you sleepy. Do not take if you will be driving.  If you are not better within the next 3-4 days please call our office.

## 2012-12-10 NOTE — Assessment & Plan Note (Signed)
Patient has been using Symbicort and Astelin regularly. She continues to smoke and we discussed importance of quitting. She has the nicotine inhaler and thinks she may give it a try in the next couple of days. Start Doxycycline. Gave pt prescription for hycodan for her cough. RTC if symptoms do not improve within 3-4 days.

## 2012-12-10 NOTE — Progress Notes (Signed)
Subjective:    Patient ID: Jodi Beasley, female    DOB: 1946/02/27, 67 y.o.   MRN: [id number]  HPI  Patient is a 67 y/o patient with hx of COPD, ongoing tobacco abuse who presents to clinic today with c/o sinus congestion, sore throat, rhinnorhea, cough "since Christmas". Patient reports being under added stress 2/2 her mother being hospitalized. She is having to run back and forth to [facility] and does not feel she is eating or sleeping well. She reports feeling "run down". Pt has tried Coriciden cough and cold without relief. She is concerned about this "turning into pneumonia".   Current Outpatient Prescriptions on File Prior to Visit  Medication Sig Dispense Refill  . aspirin 81 MG tablet Take 81 mg by mouth daily.      Marland Kitchen azelastine (ASTELIN) 137 MCG/SPRAY nasal spray Place 1 spray into the nose 2 (two) times daily. Use in each nostril as directed  30 mL  6  . b complex vitamins tablet Take 1 tablet by mouth daily.      . budesonide-formoterol (SYMBICORT) 80-4.5 MCG/ACT inhaler Inhale 2 puffs into the lungs 2 (two) times daily.  1 Inhaler  5  . carisoprodol (SOMA) 350 MG tablet TAKE 1 TABLET TWICE DAILY AS NEEDED  60 tablet  0  . citalopram (CELEXA) 20 MG tablet TAKE 1 TABLET (20 MG TOTAL) BY MOUTH DAILY.  30 tablet  3  . Coenzyme Q-10 400 MG CAPS Take 400 mg by mouth 2 (two) times daily.      . CRESTOR 40 MG tablet TAKE 1 TABLET BY MOUTH EVERY DAY  30 tablet  0  . fexofenadine (ALLEGRA) 180 MG tablet Take 180 mg by mouth daily.      . fluticasone (FLONASE) 50 MCG/ACT nasal spray USE 2 SPRAYS IN EACH NOSTRIL EVERY DAY  16 g  3  . Glucosamine-Chondroit-Vit C-Mn (GLUCOSAMINE CHONDR 1500 COMPLX PO) Take 1 tablet by mouth daily.      Marland Kitchen levothyroxine (SYNTHROID, LEVOTHROID) 112 MCG tablet TAKE 1 TABLET (112 MCG TOTAL) BY MOUTH DAILY.  30 tablet  3  . metoprolol (LOPRESSOR) 100 MG tablet TAKE 1 TABLET (100 MG TOTAL) BY MOUTH 2 (TWO) TIMES DAILY.  180 tablet  3  . Multiple Vitamin (MULTIVITAMIN)  capsule Take 1 capsule by mouth daily.      . nitroGLYCERIN (NITROSTAT) 0.4 MG SL tablet Place 0.4 mg under the tongue every 5 (five) minutes as needed.      . Omega-3 Fatty Acids (FISH OIL) 1200 MG CAPS Take 1 capsule by mouth daily.      Marland Kitchen omeprazole (PRILOSEC) 20 MG capsule TAKE ONE CAPSULE BY MOUTH EVERY DAY  90 capsule  0  . Potassium Gluconate 550 MG TABS Take 1 tablet by mouth 2 (two) times daily.       Marland Kitchen PROAIR HFA 108 (90 BASE) MCG/ACT inhaler INHALE 2 PUFFS TWICE DAILY  8.5 each  0  . cloNIDine (CATAPRES) 0.1 MG tablet Take 0.1 mg by mouth daily as needed.      . nicotine (NICOTROL) 10 MG inhaler Inhale 1 puff into the lungs as needed for smoking cessation.  42 each  0     Review of Systems  Constitutional: Positive for appetite change and fatigue. Negative for fever and chills.  HENT: Positive for congestion, sore throat, rhinorrhea and postnasal drip.   Respiratory: Positive for cough. Negative for shortness of breath and wheezing.   Gastrointestinal: Negative.   Genitourinary: Negative.   Neurological:  Negative.   Psychiatric/Behavioral: Negative.     BP 142/89  Pulse 63  Temp 98.6 F (37 C) (Oral)  Ht 5\' 5"  (1.651 m)  Wt 178 lb (80.74 kg)  BMI 29.62 kg/m2  SpO2 94%     Objective:   Physical Exam  Constitutional: She is oriented to person, place, and time. She appears well-developed and well-nourished. No distress.  HENT:  Head: Normocephalic and atraumatic.       Pharyngeal erythema  Eyes: Conjunctivae normal are normal. Pupils are equal, round, and reactive to light.  Neck: Normal range of motion. Neck supple. No thyromegaly present.  Cardiovascular: Normal rate, regular rhythm and normal heart sounds.   No murmur heard. Pulmonary/Chest: Effort normal and breath sounds normal. No respiratory distress. She has no wheezes. She has no rales.  Lymphadenopathy:    She has no cervical adenopathy.  Neurological: She is alert and oriented to person, place, and time.   Skin: Skin is warm and dry. No rash noted.  Psychiatric: She has a normal mood and affect. Her behavior is normal. Judgment and thought content normal.       Assessment & Plan:

## 2012-12-17 ENCOUNTER — Ambulatory Visit: Payer: Medicare Other | Admitting: Cardiovascular Disease

## 2012-12-18 ENCOUNTER — Other Ambulatory Visit: Payer: Self-pay | Admitting: Internal Medicine

## 2013-01-09 ENCOUNTER — Other Ambulatory Visit: Payer: Self-pay | Admitting: Internal Medicine

## 2013-01-10 ENCOUNTER — Encounter: Payer: Self-pay | Admitting: Cardiovascular Disease

## 2013-01-10 ENCOUNTER — Ambulatory Visit (INDEPENDENT_AMBULATORY_CARE_PROVIDER_SITE_OTHER): Payer: Medicare Other | Admitting: Cardiovascular Disease

## 2013-01-10 VITALS — BP 150/82 | HR 61 | Ht 66.0 in | Wt 178.5 lb

## 2013-01-10 DIAGNOSIS — R079 Chest pain, unspecified: Secondary | ICD-10-CM

## 2013-01-10 DIAGNOSIS — I10 Essential (primary) hypertension: Secondary | ICD-10-CM

## 2013-01-10 DIAGNOSIS — E785 Hyperlipidemia, unspecified: Secondary | ICD-10-CM | POA: Diagnosis not present

## 2013-01-10 DIAGNOSIS — I2510 Atherosclerotic heart disease of native coronary artery without angina pectoris: Secondary | ICD-10-CM | POA: Diagnosis not present

## 2013-01-10 DIAGNOSIS — R0602 Shortness of breath: Secondary | ICD-10-CM | POA: Diagnosis not present

## 2013-01-10 NOTE — Progress Notes (Signed)
HPI  This is a 67 year old female who is here for a followup visit regarding CAD which was treated in [place] in the past before she moved to the area. She presented in October 2009  with a small non-ST elevation myocardial infarction. Cardiac catheterization showed significant two-vessel coronary artery disease in the RCA as well as first diagonal. She underwent an angioplasty and drug-eluting stent placement x2 to the RCA and 1 stent to the first diagonal. Shortly after the procedure and while she was in the holding area, she developed acute chest pain with lateral ST elevation. She was taken back to the cardiac cath lab where she was found to have acute stent thrombosis in the first diagonal. She underwent thrombectomy and balloon angioplasty. She took Plavix for about a year and a half after that. She has not had any cardiac events since then. She does have history of COPD and has cut down smoking to about 6 cigarettes a day. She's not able to quit at this time. Most recent stress test was in 2010 which showed no evidence of ischemia with an ejection fraction of 69%. She has known history of hyperlipidemia with intolerance to multiple statins. She has been able to tolerate Crestor. She currently denies any chest pain. She has chronic dyspnea related to COPD. She denies any palpitations, syncope or presyncope. She has been stable from a cardiac standpoint overall. She has been under stress related to illness of her mother.   Allergies  Allergen Reactions  . Benzocaine     Tongue swelling  . Darvon (Propoxyphene Hcl)     HA  . Neosporin (Neomycin-Bacitracin Zn-Polymyx)     blisters  . Nicoderm (Nicotine)     arrythmia     Current Outpatient Prescriptions on File Prior to Visit  Medication Sig Dispense Refill  . aspirin 81 MG tablet Take 81 mg by mouth daily.      Marland Kitchen azelastine (ASTELIN) 137 MCG/SPRAY nasal spray Place 1 spray into the nose 2 (two) times daily. Use in each nostril as  directed  30 mL  6  . b complex vitamins tablet Take 1 tablet by mouth daily.      . budesonide-formoterol (SYMBICORT) 80-4.5 MCG/ACT inhaler Inhale 2 puffs into the lungs 2 (two) times daily.  1 Inhaler  5  . carisoprodol (SOMA) 350 MG tablet TAKE 1 TABLET TWICE DAILY AS NEEDED  60 tablet  0  . citalopram (CELEXA) 20 MG tablet TAKE 1 TABLET (20 MG TOTAL) BY MOUTH DAILY.  30 tablet  3  . cloNIDine (CATAPRES) 0.1 MG tablet Take 0.1 mg by mouth daily as needed.      . Coenzyme Q-10 400 MG CAPS Take 400 mg by mouth 2 (two) times daily.      . CRESTOR 40 MG tablet TAKE 1 TABLET BY MOUTH EVERY DAY  30 tablet  0  . CRESTOR 40 MG tablet TAKE 1 TABLET BY MOUTH EVERY DAY  30 tablet  5  . doxycycline (VIBRA-TABS) 100 MG tablet Take 1 tablet (100 mg total) by mouth 2 (two) times daily.  14 tablet  0  . fexofenadine (ALLEGRA) 180 MG tablet Take 180 mg by mouth daily.      . fluticasone (FLONASE) 50 MCG/ACT nasal spray USE 2 SPRAYS IN EACH NOSTRIL EVERY DAY  16 g  3  . Glucosamine-Chondroit-Vit C-Mn (GLUCOSAMINE CHONDR 1500 COMPLX PO) Take 1 tablet by mouth daily.      Marland Kitchen HYDROcodone-homatropine (HYCODAN) 5-1.5 MG/5ML syrup  Take 5 mLs by mouth every 8 (eight) hours as needed for cough.  120 mL  0  . levothyroxine (SYNTHROID, LEVOTHROID) 112 MCG tablet TAKE 1 TABLET (112 MCG TOTAL) BY MOUTH DAILY.  30 tablet  3  . metoprolol (LOPRESSOR) 100 MG tablet TAKE 1 TABLET (100 MG TOTAL) BY MOUTH 2 (TWO) TIMES DAILY.  180 tablet  3  . Multiple Vitamin (MULTIVITAMIN) capsule Take 1 capsule by mouth daily.      . nicotine (NICOTROL) 10 MG inhaler Inhale 1 puff into the lungs as needed for smoking cessation.  42 each  0  . nitroGLYCERIN (NITROSTAT) 0.4 MG SL tablet Place 0.4 mg under the tongue every 5 (five) minutes as needed.      . Omega-3 Fatty Acids (FISH OIL) 1200 MG CAPS Take 1 capsule by mouth daily.      Marland Kitchen omeprazole (PRILOSEC) 20 MG capsule TAKE ONE CAPSULE BY MOUTH EVERY DAY  90 capsule  3  . Potassium  Gluconate 550 MG TABS Take 1 tablet by mouth 2 (two) times daily.       Marland Kitchen PROAIR HFA 108 (90 BASE) MCG/ACT inhaler INHALE 2 PUFFS TWICE DAILY  8.5 each  0     Past Medical History  Diagnosis Date  . Depression   . Chicken pox   . GERD (gastroesophageal reflux disease)   . Hay fever   . History of blood transfusion   . Pneumonia, organism unspecified   . Hypothyroid   . COPD (chronic obstructive pulmonary disease)   . History of syncope 2000    negative EP study  . History of hypercholesterolemia   . Arrhythmia   . Hyperlipidemia     intolerance to statins except Crestor.   . Coronary artery disease     NSTEMI in 09/2008. Cath : 80% RCA and 95% first diagonal. PCI and 2 DES placement  RCA and 1 DES to diagonal. Complicated by acute diagonal stent thrombosis . Treated by thrombectomy. Most recent nuclear stress test in 2010 showed no ischemia with normal EF.   Marland Kitchen Hypertension   . MI (myocardial infarction)      Past Surgical History  Procedure Date  . Tubal ligation   . Tonsillectomy   . Coronary angioplasty with stent placement 2009    [facility], drug-eluting mid RCA,prox diag;     Family History  Problem Relation Age of Onset  . Colon cancer Father   . Lung cancer Father     was a former smoker  . Cancer Father     lung  . Hypertension    . Diabetes    . Hypertension Mother      History   Social History  . Marital Status: Widowed    Spouse Name: N/A    Number of Children: N/A  . Years of Education: N/A   Occupational History  . Not on file.   Social History Main Topics  . Smoking status: Current Every Day Smoker -- 0.2 packs/day for 53 years    Types: Cigarettes  . Smokeless tobacco: Never Used  . Alcohol Use: 0.5 oz/week    1 drink(s) per week  . Drug Use: No  . Sexually Active: Not on file   Other Topics Concern  . Not on file   Social History Narrative   Lives with partner in [place]. 2 cats 2 dogs in home.Recently moved from [facility].Custody of 58month old.      PHYSICAL EXAM   BP 150/82  Pulse 61  Ht 5\' 6"  (1.676 m)  Wt 178 lb 8 oz (80.967 kg)  BMI 28.81 kg/m2 Constitutional: She is oriented to person, place, and time. She appears well-developed and well-nourished. No distress.  HENT: No nasal discharge.  Head: Normocephalic and atraumatic.  Eyes: Pupils are equal and round. Right eye exhibits no discharge. Left eye exhibits no discharge.  Neck: Normal range of motion. Neck supple. No JVD present. No thyromegaly present.  Cardiovascular: Normal rate, regular rhythm, normal heart sounds. Exam reveals no gallop and no friction rub. No murmur heard.  Pulmonary/Chest: Effort normal and breath sounds normal. No stridor. No respiratory distress. She has no wheezes. She has no rales. She exhibits no tenderness.  Abdominal: Soft. Bowel sounds are normal. She exhibits no distension. There is no tenderness. There is no rebound and no guarding.  Musculoskeletal: Normal range of motion. She exhibits no edema and no tenderness.  Neurological: She is alert and oriented to person, place, and time. Coordination normal.  Skin: Skin is warm and dry. No rash noted. She is not diaphoretic. No erythema. No pallor.  Psychiatric: She has a normal mood and affect. Her behavior is normal. Judgment and thought content normal.     EKG: Sinus  Rhythm  -Possible old Anteroseptal infarct   ABNORMAL    ASSESSMENT AND PLAN

## 2013-01-10 NOTE — Assessment & Plan Note (Signed)
She is tolerating Crestor which should be continued.

## 2013-01-10 NOTE — Assessment & Plan Note (Signed)
She is stable from a cardiac standpoint with no symptoms suggestive of recurrent angina. Continue medical therapy.

## 2013-01-10 NOTE — Patient Instructions (Addendum)
Continue same medications  Follow up in 1 year

## 2013-01-10 NOTE — Assessment & Plan Note (Signed)
Her blood pressure is slightly elevated. If her blood pressure remains elevated upon followup, I recommend either an Ace/ARB or amlodipine.

## 2013-01-21 ENCOUNTER — Encounter: Payer: Self-pay | Admitting: Pulmonary Disease

## 2013-01-21 ENCOUNTER — Ambulatory Visit (INDEPENDENT_AMBULATORY_CARE_PROVIDER_SITE_OTHER): Payer: Medicare Other | Admitting: Pulmonary Disease

## 2013-01-21 VITALS — BP 132/82 | HR 57 | Temp 97.7°F | Ht 66.0 in | Wt 177.0 lb

## 2013-01-21 DIAGNOSIS — J449 Chronic obstructive pulmonary disease, unspecified: Secondary | ICD-10-CM | POA: Diagnosis not present

## 2013-01-21 DIAGNOSIS — Z720 Tobacco use: Secondary | ICD-10-CM

## 2013-01-21 DIAGNOSIS — J309 Allergic rhinitis, unspecified: Secondary | ICD-10-CM | POA: Insufficient documentation

## 2013-01-21 DIAGNOSIS — F17200 Nicotine dependence, unspecified, uncomplicated: Secondary | ICD-10-CM | POA: Diagnosis not present

## 2013-01-21 MED ORDER — LEVOFLOXACIN 500 MG PO TABS: 500.0000 mg | ORAL_TABLET | Freq: Every day | ORAL | Status: AC

## 2013-01-21 NOTE — Assessment & Plan Note (Signed)
Jodi Beasley continues to struggle with some phlegm and cough. From the sounds of things this is coming primarily from postnasal drip and sinus congestion likely related to an allergy.  Because of the ongoing duration of the sputum production and her known diagnosis of COPD it sounds reasonable to treat her with antibiotics.  Plan: -Levaquin x5 days -Continue Symbicort 160/4.5 dose as she seems to do better with this. -Quit smoking, quit smoking, quit smoking

## 2013-01-21 NOTE — Assessment & Plan Note (Signed)
This is contributing greatly to her ongoing phlegm and sputum production. It sounds like she is allergic to something in her house she's not sure what.  Plan: -Continue Flonase -Start saline rinses -Start Chlor-Trimeton -Start over-the-counter decongestants -Continue Astelin -Consider referral to an allergist locally if no improvement with the above interventions.

## 2013-01-21 NOTE — Patient Instructions (Addendum)
Use the levaquin for 5 days  Use Neil Med rinses with distilled water at least twice per day using the instructions on the package. 1/2 hour after using the [facility] Med rinse, use Flonase two puffs in each nostril once per day. Use chlortrimeton and an over the counter decongestant (pseudophed or phenylephrine) as needed for the sinus congestion and cough.  Continue taking the symbicort and albuterol as you are doing  We will see you back in 4-6 weeks or sooner if needed

## 2013-01-21 NOTE — Assessment & Plan Note (Signed)
Encouraged again to quit. She is undergoing a lot of stress lately and she says it's not a good time to try quit.

## 2013-01-21 NOTE — Progress Notes (Signed)
Subjective:    Patient ID: Jodi Beasley, female    DOB: 03/24/1946, 67 y.o.   MRN: [id number]  Synopsis: Jodi Beasley established care with the [facility] pulmonary clinic in May 2013 for COPD. She simple spirometry at that time with a ratio of 71%, the flow volume loop consistent with obstruction and an FEV1 of 1.92 L or 77% predicted. She was followed previously by Dr. Alphonzo Grieve, a pulmonologist in the [place], [place] area. She had been treated for pneumonia 3 weeks prior to her initial visit.  She has smoked one half pack of cigarettes per day for 53 years and continues to smoke.  HPI    7/8 ROV -- Jodi Beasley returns to clinic today for evaluation of her COPD. She states that she had an episode of bronchitis in May which improved with treatment with antibiotics. She do not now how to get a hold of [place] so she ended up going to urgent care. Since then she states her symptoms have improved dramatically and she is not having to use her rescue inhaler much. She is using Astelin nasal spray more often than the Nasonex and she says it worked quite well. She really only has her breath in the heat and humidity. She continues to smoke and is frustrated with her inability to quit. She states that nicotine replacement is helped most of the past.   10/16/2012 ROV --Jodi Beasley returns to clinic today for followup on her COPD. She says that she did not like to [place] and that she prefers to take the Symbicort. She currently uses 1 puff in the morning and a second later in the evening. She gets relief of shortness of breath with this. She usually has to use one dose of albuterol before going to bed. When the ragweed was out she was having to use albuterol more often. She has noted an increase in chest congestion in the last few days. She denies fevers, chills, or shortness of breath.  She continues to smoke cigarettes. She is smoking 6-10 cigarettes a day. After the last visit she did not call us to tell  us that the nicotine inhaler we gave her was not to her liking.   She has not had flares of bronchitis since the last visit.  01/21/2013 ROV -- Since our last visit Jodi Beasley has been struggling with mucus production both from her nose and lungs. She states that she has had multiple family members that have been sick and she has been in and out of hospitals. She has suffered from sinus congestion, runny nose, phlegm in her throat, and sputum production. This is been associated with a mild increase in her baseline dyspnea and wheezing. She has only been taking over-the-counter Mucinex and dextromethorphan. She also notes watery eyes and itchy eyes associated with the symptoms. She is not using saline rinses. She is using Flonase and Astelin. She has stopped taking Allegra tablets because she says "they don't work". She denies fevers chills or chest pain. Several weeks ago she was treated with doxycycline but she states that this did not help.    Past Medical History  Diagnosis Date  . Depression   . Chicken pox   . GERD (gastroesophageal reflux disease)   . Hay fever   . History of blood transfusion   . Pneumonia, organism unspecified   . Hypothyroid   . COPD (chronic obstructive pulmonary disease)   . History of syncope 2000    negative EP study  .  History of hypercholesterolemia   . Arrhythmia   . Hyperlipidemia     intolerance to statins except Crestor.   . Coronary artery disease     NSTEMI in 09/2008. Cath : 80% RCA and 95% first diagonal. PCI and 2 DES placement  RCA and 1 DES to diagonal. Complicated by acute diagonal stent thrombosis . Treated by thrombectomy. Most recent nuclear stress test in 2010 showed no ischemia with normal EF.   Marland Kitchen Hypertension   . MI (myocardial infarction)     Review of Systems  Constitutional: Negative for fever, fatigue and unexpected weight change.  HENT: Positive for congestion, rhinorrhea and postnasal drip. Negative for sinus pressure.    Respiratory: Positive for cough and shortness of breath. Negative for choking.   Cardiovascular: Negative for chest pain and leg swelling.       Objective:   Physical Exam  Filed Vitals:   01/21/13 1117  BP: 132/82  Pulse: 57  Temp: 97.7 F (36.5 C)  TempSrc: Oral  Height: 5\' 6"  (1.676 m)  Weight: 177 lb (80.287 kg)  SpO2: 95%    Gen: well appearing, no acute distress HEENT: NCAT, PERRL, EOMi, OP clear, neck supple without masses;  PULM: Few rhonchi in the upper lobes bilaterally CV: RRR, no mgr, no JVD AB: BS+, soft, nontender, no hsm Ext: warm, no edema, no clubbing, no cyanosis   04/09/2012 Simple Spirometry: Ratio 71%, FEV1 1.92L (77% pred); flow volume loop consistent with obstruction     Assessment & Plan:   COPD (chronic obstructive pulmonary disease) with chronic bronchitis Jodi Beasley continues to struggle with some phlegm and cough. From the sounds of things this is coming primarily from postnasal drip and sinus congestion likely related to an allergy.  Because of the ongoing duration of the sputum production and her known diagnosis of COPD it sounds reasonable to treat her with antibiotics.  Plan: -Levaquin x5 days -Continue Symbicort 160/4.5 dose as she seems to do better with this. -Quit smoking, quit smoking, quit smoking  Tobacco abuse Encouraged again to quit. She is undergoing a lot of stress lately and she says it's not a good time to try quit.  Allergic rhinitis This is contributing greatly to her ongoing phlegm and sputum production. It sounds like she is allergic to something in her house she's not sure what.  Plan: -Continue Flonase -Start saline rinses -Start Chlor-Trimeton -Start over-the-counter decongestants -Continue Astelin -Consider referral to an allergist locally if no improvement with the above interventions.    Updated Medication List Outpatient Encounter Prescriptions as of 01/21/2013  Medication Sig Dispense Refill  . aspirin  81 MG tablet Take 81 mg by mouth daily.      Marland Kitchen azelastine (ASTELIN) 137 MCG/SPRAY nasal spray Place 1 spray into the nose 2 (two) times daily. Use in each nostril as directed  30 mL  6  . b complex vitamins tablet Take 1 tablet by mouth daily.      . budesonide-formoterol (SYMBICORT) 160-4.5 MCG/ACT inhaler Inhale 2 puffs into the lungs 2 (two) times daily.      . carisoprodol (SOMA) 350 MG tablet TAKE 1 TABLET TWICE DAILY AS NEEDED  60 tablet  0  . citalopram (CELEXA) 20 MG tablet TAKE 1 TABLET (20 MG TOTAL) BY MOUTH DAILY.  30 tablet  3  . cloNIDine (CATAPRES) 0.1 MG tablet Take 0.1 mg by mouth daily as needed.      . Coenzyme Q-10 400 MG CAPS Take 400 mg by mouth 2 (  two) times daily.      . CRESTOR 40 MG tablet TAKE 1 TABLET BY MOUTH EVERY DAY  30 tablet  0  . fluticasone (FLONASE) 50 MCG/ACT nasal spray USE 2 SPRAYS IN EACH NOSTRIL EVERY DAY  16 g  3  . Glucosamine-Chondroit-Vit C-Mn (GLUCOSAMINE CHONDR 1500 COMPLX PO) Take 1 tablet by mouth daily.      Marland Kitchen levothyroxine (SYNTHROID, LEVOTHROID) 112 MCG tablet TAKE 1 TABLET (112 MCG TOTAL) BY MOUTH DAILY.  30 tablet  3  . metoprolol (LOPRESSOR) 100 MG tablet TAKE 1 TABLET (100 MG TOTAL) BY MOUTH 2 (TWO) TIMES DAILY.  180 tablet  3  . Multiple Vitamin (MULTIVITAMIN) capsule Take 1 capsule by mouth daily.      . nitroGLYCERIN (NITROSTAT) 0.4 MG SL tablet Place 0.4 mg under the tongue every 5 (five) minutes as needed.      . Omega-3 Fatty Acids (FISH OIL) 1200 MG CAPS Take 1 capsule by mouth daily.      Marland Kitchen omeprazole (PRILOSEC) 20 MG capsule TAKE ONE CAPSULE BY MOUTH EVERY DAY  90 capsule  3  . Potassium Gluconate 550 MG TABS Take 1 tablet by mouth 2 (two) times daily.       Marland Kitchen PROAIR HFA 108 (90 BASE) MCG/ACT inhaler INHALE 2 PUFFS TWICE DAILY  8.5 each  0  . fexofenadine (ALLEGRA) 180 MG tablet Take 180 mg by mouth daily.      Marland Kitchen HYDROcodone-homatropine (HYCODAN) 5-1.5 MG/5ML syrup Take 5 mLs by mouth every 8 (eight) hours as needed for cough.  120  mL  0  . nicotine (NICOTROL) 10 MG inhaler Inhale 1 puff into the lungs as needed for smoking cessation.  42 each  0  . [DISCONTINUED] budesonide-formoterol (SYMBICORT) 80-4.5 MCG/ACT inhaler Inhale 2 puffs into the lungs 2 (two) times daily.  1 Inhaler  5  . [DISCONTINUED] CRESTOR 40 MG tablet TAKE 1 TABLET BY MOUTH EVERY DAY  30 tablet  5  . [DISCONTINUED] doxycycline (VIBRA-TABS) 100 MG tablet Take 1 tablet (100 mg total) by mouth 2 (two) times daily.  14 tablet  0   No facility-administered encounter medications on file as of 01/21/2013.

## 2013-01-24 ENCOUNTER — Ambulatory Visit (INDEPENDENT_AMBULATORY_CARE_PROVIDER_SITE_OTHER): Payer: Medicare Other | Admitting: Internal Medicine

## 2013-01-24 ENCOUNTER — Encounter: Payer: Self-pay | Admitting: Internal Medicine

## 2013-01-24 VITALS — BP 118/80 | HR 69 | Temp 98.1°F | Ht 64.0 in | Wt 176.0 lb

## 2013-01-24 DIAGNOSIS — E039 Hypothyroidism, unspecified: Secondary | ICD-10-CM

## 2013-01-24 DIAGNOSIS — J4489 Other specified chronic obstructive pulmonary disease: Secondary | ICD-10-CM

## 2013-01-24 DIAGNOSIS — E785 Hyperlipidemia, unspecified: Secondary | ICD-10-CM

## 2013-01-24 DIAGNOSIS — F411 Generalized anxiety disorder: Secondary | ICD-10-CM

## 2013-01-24 DIAGNOSIS — J449 Chronic obstructive pulmonary disease, unspecified: Secondary | ICD-10-CM | POA: Diagnosis not present

## 2013-01-24 DIAGNOSIS — F419 Anxiety disorder, unspecified: Secondary | ICD-10-CM

## 2013-01-24 LAB — COMPREHENSIVE METABOLIC PANEL
AST: 29 U/L (ref 0–37)
Alkaline Phosphatase: 65 U/L (ref 39–117)
BUN: 10 mg/dL (ref 6–23)
Calcium: 9.6 mg/dL (ref 8.4–10.5)
Creatinine, Ser: 0.9 mg/dL (ref 0.4–1.2)
Total Bilirubin: 0.7 mg/dL (ref 0.3–1.2)

## 2013-01-24 LAB — LIPID PANEL
HDL: 51.5 mg/dL (ref >39.00)
LDL Cholesterol: 72 mg/dL (ref 0–99)
Total CHOL/HDL Ratio: 3
Triglycerides: 197 mg/dL — ABNORMAL HIGH (ref 0.0–149.0)

## 2013-01-24 MED ORDER — CARISOPRODOL 350 MG PO TABS: 350.0000 mg | ORAL_TABLET | Freq: Two times a day (BID) | ORAL | Status: DC | PRN

## 2013-01-24 NOTE — Assessment & Plan Note (Signed)
Will recheck TSH and free T4 with labs today. 

## 2013-01-24 NOTE — Assessment & Plan Note (Signed)
Recently seen by pulmonologist. Continues to have issues with wheezing and cough, however symptoms gradually improving. Will continue Symbicort and Levaquin as directed by Dr. Kendrick Fries.

## 2013-01-24 NOTE — Assessment & Plan Note (Signed)
Symptoms recently worsened with passing of pt dog and illness of mother and husband. Discussed using medication such as alprazolam to help with anxiety. Pt would like to hold off for now. Discussed counseling through hospice. Will continue Citalopram.  Follow up 3 months and prn.

## 2013-01-24 NOTE — Assessment & Plan Note (Signed)
Will check lipids with labs today.  

## 2013-01-24 NOTE — Progress Notes (Signed)
Subjective:    Patient ID: Jodi Beasley, female    DOB: 10-23-1946, 67 y.o.   MRN: [id number]  HPI 67YO female with COPD, anxiety, chronic pain presents for follow up.  Anxiety - recently worsened with family stressors and passing of pt dog.  Pt has been taking Celexa with minimal improvement. She also occasionally has been taking an extra Soma to help with anxiety. We discussed that this medication is not meant to treat anxiety.  Pt has support of family and friends. No suicidal or homicidal ideation.  COPD - Recent increased wheezing, cough productive of purulent sputum. Continues to smoke.  Seen by pulmonologist 2/17. Started on Levaquin and Symbicort and has had some improvement. No fever, chills, chest pain, dyspnea.  Outpatient Encounter Prescriptions as of 01/24/2013  Medication Sig Dispense Refill  . aspirin 81 MG tablet Take 81 mg by mouth daily.      Marland Kitchen azelastine (ASTELIN) 137 MCG/SPRAY nasal spray Place 1 spray into the nose 2 (two) times daily. Use in each nostril as directed  30 mL  6  . b complex vitamins tablet Take 1 tablet by mouth daily.      . budesonide-formoterol (SYMBICORT) 160-4.5 MCG/ACT inhaler Inhale 2 puffs into the lungs 2 (two) times daily.      . citalopram (CELEXA) 20 MG tablet TAKE 1 TABLET (20 MG TOTAL) BY MOUTH DAILY.  30 tablet  3  . cloNIDine (CATAPRES) 0.1 MG tablet Take 0.1 mg by mouth daily as needed.      . Coenzyme Q-10 400 MG CAPS Take 400 mg by mouth 2 (two) times daily.      . CRESTOR 40 MG tablet TAKE 1 TABLET BY MOUTH EVERY DAY  30 tablet  0  . fluticasone (FLONASE) 50 MCG/ACT nasal spray USE 2 SPRAYS IN EACH NOSTRIL EVERY DAY  16 g  3  . Glucosamine-Chondroit-Vit C-Mn (GLUCOSAMINE CHONDR 1500 COMPLX PO) Take 1 tablet by mouth daily.      Marland Kitchen HYDROcodone-homatropine (HYCODAN) 5-1.5 MG/5ML syrup Take 5 mLs by mouth every 8 (eight) hours as needed for cough.  120 mL  0  . levofloxacin (LEVAQUIN) 500 MG tablet Take 1 tablet (500 mg total) by mouth  daily.  5 tablet  0  . levothyroxine (SYNTHROID, LEVOTHROID) 112 MCG tablet TAKE 1 TABLET (112 MCG TOTAL) BY MOUTH DAILY.  30 tablet  3  . metoprolol (LOPRESSOR) 100 MG tablet TAKE 1 TABLET (100 MG TOTAL) BY MOUTH 2 (TWO) TIMES DAILY.  180 tablet  3  . Multiple Vitamin (MULTIVITAMIN) capsule Take 1 capsule by mouth daily.      . nicotine (NICOTROL) 10 MG inhaler Inhale 1 puff into the lungs as needed for smoking cessation.  42 each  0  . nitroGLYCERIN (NITROSTAT) 0.4 MG SL tablet Place 0.4 mg under the tongue every 5 (five) minutes as needed.      . Omega-3 Fatty Acids (FISH OIL) 1200 MG CAPS Take 1 capsule by mouth daily.      Marland Kitchen omeprazole (PRILOSEC) 20 MG capsule TAKE ONE CAPSULE BY MOUTH EVERY DAY  90 capsule  3  . Potassium Gluconate 550 MG TABS Take 1 tablet by mouth 2 (two) times daily.       Marland Kitchen PROAIR HFA 108 (90 BASE) MCG/ACT inhaler INHALE 2 PUFFS TWICE DAILY  8.5 each  0  . [DISCONTINUED] carisoprodol (SOMA) 350 MG tablet TAKE 1 TABLET TWICE DAILY AS NEEDED  60 tablet  0  . carisoprodol (SOMA) 350 MG  tablet Take 1 tablet (350 mg total) by mouth 2 (two) times daily as needed for muscle spasms.  60 tablet  3  . fexofenadine (ALLEGRA) 180 MG tablet Take 180 mg by mouth daily.       No facility-administered encounter medications on file as of 01/24/2013.   BP 118/80  Pulse 69  Temp(Src) 98.1 F (36.7 C) (Oral)  Ht 5\' 4"  (1.626 m)  Wt 176 lb (79.833 kg)  BMI 30.2 kg/m2  SpO2 94%  Review of Systems  Constitutional: Negative for fever, chills, appetite change, fatigue and unexpected weight change.  HENT: Negative for ear pain, congestion, sore throat, trouble swallowing, neck pain, voice change and sinus pressure.   Eyes: Negative for visual disturbance.  Respiratory: Positive for cough and shortness of breath. Negative for wheezing and stridor.   Cardiovascular: Negative for chest pain, palpitations and leg swelling.  Gastrointestinal: Negative for nausea, vomiting, abdominal pain,  diarrhea, constipation, blood in stool, abdominal distention and anal bleeding.  Genitourinary: Negative for dysuria and flank pain.  Musculoskeletal: Negative for myalgias, arthralgias and gait problem.  Skin: Negative for color change and rash.  Neurological: Negative for dizziness and headaches.  Hematological: Negative for adenopathy. Does not bruise/bleed easily.  Psychiatric/Behavioral: Positive for dysphoric mood. Negative for suicidal ideas and sleep disturbance. The patient is nervous/anxious.        Objective:   Physical Exam  Constitutional: She is oriented to person, place, and time. She appears well-developed and well-nourished. No distress.  HENT:  Head: Normocephalic and atraumatic.  Right Ear: External ear normal.  Left Ear: External ear normal.  Nose: Nose normal.  Mouth/Throat: Oropharynx is clear and moist. No oropharyngeal exudate.  Eyes: Conjunctivae are normal. Pupils are equal, round, and reactive to light. Right eye exhibits no discharge. Left eye exhibits no discharge. No scleral icterus.  Neck: Normal range of motion. Neck supple. No tracheal deviation present. No thyromegaly present.  Cardiovascular: Normal rate, regular rhythm, normal heart sounds and intact distal pulses.  Exam reveals no gallop and no friction rub.   No murmur heard. Pulmonary/Chest: Effort normal. No accessory muscle usage. Not tachypneic. No respiratory distress. She has no decreased breath sounds. She has wheezes in the right middle field and the right lower field. She has no rhonchi. She has no rales. She exhibits no tenderness.  Musculoskeletal: Normal range of motion. She exhibits no edema and no tenderness.  Lymphadenopathy:    She has no cervical adenopathy.  Neurological: She is alert and oriented to person, place, and time. No cranial nerve deficit. She exhibits normal muscle tone. Coordination normal.  Skin: Skin is warm and dry. No rash noted. She is not diaphoretic. No erythema.  No pallor.  Psychiatric: She has a normal mood and affect. Her behavior is normal. Judgment and thought content normal.          Assessment & Plan:

## 2013-02-19 ENCOUNTER — Ambulatory Visit (INDEPENDENT_AMBULATORY_CARE_PROVIDER_SITE_OTHER): Payer: Medicare Other | Admitting: Pulmonary Disease

## 2013-02-19 ENCOUNTER — Encounter: Payer: Self-pay | Admitting: Pulmonary Disease

## 2013-02-19 VITALS — BP 104/68 | HR 64 | Temp 98.0°F | Ht 66.0 in | Wt 176.0 lb

## 2013-02-19 DIAGNOSIS — F17200 Nicotine dependence, unspecified, uncomplicated: Secondary | ICD-10-CM | POA: Diagnosis not present

## 2013-02-19 DIAGNOSIS — Z720 Tobacco use: Secondary | ICD-10-CM

## 2013-02-19 DIAGNOSIS — J449 Chronic obstructive pulmonary disease, unspecified: Secondary | ICD-10-CM | POA: Diagnosis not present

## 2013-02-19 NOTE — Patient Instructions (Signed)
Keep taking your medications as you are doing You can take mucinex to help break up the chest congestion Quit smoking, quit smoking quit smoking Increase your physical activity.  We are happy to refer you to pulmonary rehab if you are willing. We will see you back in 3 months or sooner if needed

## 2013-02-19 NOTE — Assessment & Plan Note (Signed)
We discussed this at length again today. She states that she is not mentally able to quit. We spent at least 10 minutes talking about the natural history of lung disease in smokers and the reasons to quit. 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  I encouraged her to start using the Nicotrol sooner rather than later.

## 2013-02-19 NOTE — Progress Notes (Signed)
Subjective:    Patient ID: Jodi Beasley, female    DOB: 1946-03-24, 67 y.o.   MRN: [id number]  Synopsis: Jodi Beasley established care with the [facility] pulmonary clinic in May 2013 for COPD. She simple spirometry at that time with a ratio of 71%, the flow volume loop consistent with obstruction and an FEV1 of 1.92 L or 77% predicted. She was followed previously by Dr. Alphonzo Grieve, a pulmonologist in the [place], [place] area. She had been treated for pneumonia 3 weeks prior to her initial visit.  She has smoked one half pack of cigarettes per day for 53 years and continues to smoke.  HPI    7/8 ROV -- Jodi Beasley returns to clinic today for evaluation of her COPD. She states that she had an episode of bronchitis in May which improved with treatment with antibiotics. She do not now how to get a hold of [place] so she ended up going to urgent care. Since then she states her symptoms have improved dramatically and she is not having to use her rescue inhaler much. She is using Astelin nasal spray more often than the Nasonex and she says it worked quite well. She really only has her breath in the heat and humidity. She continues to smoke and is frustrated with her inability to quit. She states that nicotine replacement is helped most of the past.   10/16/2012 ROV --Jodi Beasley returns to clinic today for followup on her COPD. She says that she did not like to [place] and that she prefers to take the Symbicort. She currently uses 1 puff in the morning and a second later in the evening. She gets relief of shortness of breath with this. She usually has to use one dose of albuterol before going to bed. When the ragweed was out she was having to use albuterol more often. She has noted an increase in chest congestion in the last few days. She denies fevers, chills, or shortness of breath.  She continues to smoke cigarettes. She is smoking 6-10 cigarettes a day. After the last visit she did not call us to tell  us that the nicotine inhaler we gave her was not to her liking.   She has not had flares of bronchitis since the last visit.  01/21/2013 ROV -- Since our last visit [name] has been struggling with mucus production both from her nose and lungs. She states that she has had multiple family members that have been sick and she has been in and out of hospitals. She has suffered from sinus congestion, runny nose, phlegm in her throat, and sputum production. This is been associated with a mild increase in her baseline dyspnea and wheezing. She has only been taking over-the-counter Mucinex and dextromethorphan. She also notes watery eyes and itchy eyes associated with the symptoms. She is not using saline rinses. She is using Flonase and Astelin. She has stopped taking Allegra tablets because she says "they don't work". She denies fevers chills or chest pain. Several weeks ago she was treated with doxycycline but she states that this did not help.  02/19/2013 ROV -- Jodi Beasley is doing much better since her last visit. Her sinus congestion has improved significantly and she now feels that she is able to clear some of the chest congestion. Her shortness of breath is gradually improving but she still feels short of breath with chasing after her grandchildren and running a vacuum cleaner. She continues to smoke several cigarettes per day. She purchased a  Nicotrol inhaler but she has not started using it yet.     Past Medical History  Diagnosis Date  . Depression   . Chicken pox   . GERD (gastroesophageal reflux disease)   . Hay fever   . History of blood transfusion   . Pneumonia, organism unspecified   . Hypothyroid   . COPD (chronic obstructive pulmonary disease)   . History of syncope 2000    negative EP study  . History of hypercholesterolemia   . Arrhythmia   . Hyperlipidemia     intolerance to statins except Crestor.   . Coronary artery disease     NSTEMI in 09/2008. Cath : 80% RCA and 95% first  diagonal. PCI and 2 DES placement  RCA and 1 DES to diagonal. Complicated by acute diagonal stent thrombosis . Treated by thrombectomy. Most recent nuclear stress test in 2010 showed no ischemia with normal EF.   Marland Kitchen Hypertension   . MI (myocardial infarction)     Review of Systems  Constitutional: Negative for fever, fatigue and unexpected weight change.  HENT: Negative for congestion, rhinorrhea, postnasal drip and sinus pressure.   Respiratory: Positive for cough and shortness of breath. Negative for choking.   Cardiovascular: Negative for chest pain and leg swelling.       Objective:   Physical Exam  Filed Vitals:   02/19/13 1038  BP: 104/68  Pulse: 64  Temp: 98 F (36.7 C)  TempSrc: Oral  Height: 5\' 6"  (1.676 m)  Weight: 176 lb (79.833 kg)  SpO2: 94%   room air  Gen: well appearing, no acute distress HEENT: NCAT, PERRL, EOMi, OP clear, neck supple without masses;  PULM: Clear to auscultation bilaterally CV: RRR, no mgr, no JVD AB: BS+, soft, nontender, no hsm Ext: warm, no edema, no clubbing, no cyanosis   04/09/2012 Simple Spirometry: Ratio 71%, FEV1 1.92L (77% pred); flow volume loop consistent with obstruction     Assessment & Plan:   COPD (chronic obstructive pulmonary disease) with chronic bronchitis Fortunately Jodi Beasley has started to recover from her acute exacerbation of COPD. She has a chronic bronchitis phenotype which predisposes her to hypoxemia and she continues to smoke. She also has a component of reactive airways disease I believe with some likely asthma overlap.  Plan: -Encouraged greatly to quit smoking, see below -Continue Symbicort at 160/4.5 -Add Mucinex to help with chest congestion -Start pulmonary rehabilitation if she is willing -Followup with [place] in 3 months.  Tobacco abuse We discussed this at length again today. She states that she is not mentally able to quit. We spent at least 10 minutes talking about the natural history of lung disease  in smokers and the reasons to quit. 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  I encouraged her to start using the Nicotrol sooner rather than later.    Updated Medication List Outpatient Encounter Prescriptions as of 02/19/2013  Medication Sig Dispense Refill  . albuterol (PROAIR HFA) 108 (90 BASE) MCG/ACT inhaler       . aspirin 81 MG tablet Take 81 mg by mouth daily.      Marland Kitchen azelastine (ASTELIN) 137 MCG/SPRAY nasal spray Place 1 spray into the nose 2 (two) times daily. Use in each nostril as directed  30 mL  6  . b complex vitamins tablet Take 1 tablet by mouth daily.      Marland Kitchen  budesonide-formoterol (SYMBICORT) 160-4.5 MCG/ACT inhaler Inhale 2 puffs into the lungs 2 (two) times daily.      . carisoprodol (SOMA) 350 MG tablet Take 1 tablet (350 mg total) by mouth 2 (two) times daily as needed for muscle spasms.  60 tablet  3  . citalopram (CELEXA) 20 MG tablet       . cloNIDine (CATAPRES) 0.1 MG tablet Take 0.1 mg by mouth daily as needed.      . Coenzyme Q-10 400 MG CAPS Take 400 mg by mouth 2 (two) times daily.      . CRESTOR 40 MG tablet TAKE 1 TABLET BY MOUTH EVERY DAY  30 tablet  0  . fluticasone (FLONASE) 50 MCG/ACT nasal spray USE 2 SPRAYS IN EACH NOSTRIL EVERY DAY  16 g  3  . Glucosamine-Chondroit-Vit C-Mn (GLUCOSAMINE CHONDR 1500 COMPLX PO) Take 1 tablet by mouth daily.      Marland Kitchen levothyroxine (SYNTHROID, LEVOTHROID) 112 MCG tablet TAKE 1 TABLET (112 MCG TOTAL) BY MOUTH DAILY.  30 tablet  3  . metoprolol (LOPRESSOR) 100 MG tablet TAKE 1 TABLET (100 MG TOTAL) BY MOUTH 2 (TWO) TIMES DAILY.  180 tablet  3  . Multiple Vitamin (MULTIVITAMIN) capsule Take 1 capsule by mouth daily.      . nitroGLYCERIN (NITROSTAT) 0.4 MG SL tablet Place 0.4 mg under the tongue every 5 (five) minutes as needed.      . Omega-3 Fatty Acids (FISH OIL) 1200 MG CAPS Take 1 capsule by mouth daily.      Marland Kitchen omeprazole (PRILOSEC) 20  MG capsule TAKE ONE CAPSULE BY MOUTH EVERY DAY  90 capsule  3  . Potassium Gluconate 550 MG TABS Take 1 tablet by mouth 2 (two) times daily.       Marland Kitchen UNABLE TO FIND Med Name: Sinus and Allergy relief PE Per box directions as needed      . [DISCONTINUED] citalopram (CELEXA) 20 MG tablet TAKE 1 TABLET (20 MG TOTAL) BY MOUTH DAILY.  30 tablet  3  . [DISCONTINUED] PROAIR HFA 108 (90 BASE) MCG/ACT inhaler INHALE 2 PUFFS TWICE DAILY  8.5 each  0  . nicotine (NICOTROL) 10 MG inhaler Inhale 1 puff into the lungs as needed for smoking cessation.  42 each  0  . [DISCONTINUED] fexofenadine (ALLEGRA) 180 MG tablet Take 180 mg by mouth daily.      . [DISCONTINUED] HYDROcodone-homatropine (HYCODAN) 5-1.5 MG/5ML syrup Take 5 mLs by mouth every 8 (eight) hours as needed for cough.  120 mL  0   No facility-administered encounter medications on file as of 02/19/2013.

## 2013-02-19 NOTE — Assessment & Plan Note (Addendum)
Fortunately Jodi Beasley has started to recover from her acute exacerbation of COPD. She has a chronic bronchitis phenotype which predisposes her to hypoxemia and she continues to smoke. She also has a component of reactive airways disease I believe with some likely asthma overlap.  Plan: -Encouraged greatly to quit smoking, see below -Continue Symbicort at 160/4.5 -Add Mucinex to help with chest congestion -Start pulmonary rehabilitation if she is willing -Followup with [place] in 3 months.

## 2013-02-26 DIAGNOSIS — M9981 Other biomechanical lesions of cervical region: Secondary | ICD-10-CM | POA: Diagnosis not present

## 2013-02-26 DIAGNOSIS — M531 Cervicobrachial syndrome: Secondary | ICD-10-CM | POA: Diagnosis not present

## 2013-02-26 DIAGNOSIS — M999 Biomechanical lesion, unspecified: Secondary | ICD-10-CM | POA: Diagnosis not present

## 2013-02-26 DIAGNOSIS — M5137 Other intervertebral disc degeneration, lumbosacral region: Secondary | ICD-10-CM | POA: Diagnosis not present

## 2013-02-26 DIAGNOSIS — M5430 Sciatica, unspecified side: Secondary | ICD-10-CM | POA: Diagnosis not present

## 2013-03-04 DIAGNOSIS — M5137 Other intervertebral disc degeneration, lumbosacral region: Secondary | ICD-10-CM | POA: Diagnosis not present

## 2013-03-04 DIAGNOSIS — M5430 Sciatica, unspecified side: Secondary | ICD-10-CM | POA: Diagnosis not present

## 2013-03-04 DIAGNOSIS — M531 Cervicobrachial syndrome: Secondary | ICD-10-CM | POA: Diagnosis not present

## 2013-03-04 DIAGNOSIS — M9981 Other biomechanical lesions of cervical region: Secondary | ICD-10-CM | POA: Diagnosis not present

## 2013-03-04 DIAGNOSIS — M999 Biomechanical lesion, unspecified: Secondary | ICD-10-CM | POA: Diagnosis not present

## 2013-03-05 DIAGNOSIS — M9981 Other biomechanical lesions of cervical region: Secondary | ICD-10-CM | POA: Diagnosis not present

## 2013-03-05 DIAGNOSIS — M531 Cervicobrachial syndrome: Secondary | ICD-10-CM | POA: Diagnosis not present

## 2013-03-05 DIAGNOSIS — M5430 Sciatica, unspecified side: Secondary | ICD-10-CM | POA: Diagnosis not present

## 2013-03-05 DIAGNOSIS — M5137 Other intervertebral disc degeneration, lumbosacral region: Secondary | ICD-10-CM | POA: Diagnosis not present

## 2013-03-05 DIAGNOSIS — M999 Biomechanical lesion, unspecified: Secondary | ICD-10-CM | POA: Diagnosis not present

## 2013-03-07 ENCOUNTER — Encounter: Payer: Self-pay | Admitting: Adult Health

## 2013-03-07 ENCOUNTER — Ambulatory Visit (INDEPENDENT_AMBULATORY_CARE_PROVIDER_SITE_OTHER): Payer: Medicare Other | Admitting: Adult Health

## 2013-03-07 VITALS — BP 133/89 | HR 71 | Temp 98.1°F | Resp 16 | Ht 65.5 in | Wt 179.0 lb

## 2013-03-07 DIAGNOSIS — M549 Dorsalgia, unspecified: Secondary | ICD-10-CM | POA: Diagnosis not present

## 2013-03-07 DIAGNOSIS — M5137 Other intervertebral disc degeneration, lumbosacral region: Secondary | ICD-10-CM | POA: Diagnosis not present

## 2013-03-07 DIAGNOSIS — M999 Biomechanical lesion, unspecified: Secondary | ICD-10-CM | POA: Diagnosis not present

## 2013-03-07 DIAGNOSIS — M9981 Other biomechanical lesions of cervical region: Secondary | ICD-10-CM | POA: Diagnosis not present

## 2013-03-07 DIAGNOSIS — M5430 Sciatica, unspecified side: Secondary | ICD-10-CM | POA: Diagnosis not present

## 2013-03-07 DIAGNOSIS — M531 Cervicobrachial syndrome: Secondary | ICD-10-CM | POA: Diagnosis not present

## 2013-03-07 MED ORDER — CARISOPRODOL 350 MG PO TABS: 350.0000 mg | ORAL_TABLET | Freq: Two times a day (BID) | ORAL | Status: DC | PRN

## 2013-03-07 MED ORDER — HYDROCODONE-ACETAMINOPHEN 5-325 MG PO TABS: 1.0000 | ORAL_TABLET | Freq: Four times a day (QID) | ORAL | Status: DC | PRN

## 2013-03-07 NOTE — Progress Notes (Signed)
  Subjective:    Patient ID: Jodi Beasley, female    DOB: 1946-01-27, 67 y.o.   MRN: [id number]  HPI  Patient is a 67 y/o female with long hx of back problems. She has been caring for her elderly mother and reports "I did something to my back a couple of weeks ago". She has been going to see a Land and was informed that her left hip is 1/2 inch lower that the right. She is seeing the Chiropractor 2-3 times per week. She also reports having a bulging disk in the lumbar spine.  Review of Systems  Musculoskeletal: Positive for back pain.  Neurological: Positive for numbness. Negative for tremors and weakness.       Numbness in lower part of left leg.   BP 133/89  Pulse 71  Temp(Src) 98.1 F (36.7 C) (Oral)  Resp 16  Ht 5' 5.5" (1.664 m)  Wt 179 lb (81.194 kg)  BMI 29.32 kg/m2  SpO2 97%     Objective:   Physical Exam  Constitutional: She is oriented to person, place, and time. She appears well-developed and well-nourished. No distress.  Cardiovascular: Normal rate.   Pulmonary/Chest: Effort normal.  Musculoskeletal: She exhibits no edema.  Neurological: She is alert and oriented to person, place, and time. She has normal reflexes. She displays normal reflexes. No cranial nerve deficit. She exhibits normal muscle tone. Coordination normal.  Psychiatric: She has a normal mood and affect. Her behavior is normal. Thought content normal.       Assessment & Plan:

## 2013-03-07 NOTE — Assessment & Plan Note (Signed)
Acute on chronic back pain with sciatic nerve involvement possibly occurred while helping her elderly mother. She has no neurological deficits, no loss of sensation or function. She is seeing a chiropractor 3 times weekly. She has been taking soma for muscle spasms. Prescribed short course of Norco.

## 2013-03-12 DIAGNOSIS — M9981 Other biomechanical lesions of cervical region: Secondary | ICD-10-CM | POA: Diagnosis not present

## 2013-03-12 DIAGNOSIS — M5137 Other intervertebral disc degeneration, lumbosacral region: Secondary | ICD-10-CM | POA: Diagnosis not present

## 2013-03-12 DIAGNOSIS — M5430 Sciatica, unspecified side: Secondary | ICD-10-CM | POA: Diagnosis not present

## 2013-03-12 DIAGNOSIS — M999 Biomechanical lesion, unspecified: Secondary | ICD-10-CM | POA: Diagnosis not present

## 2013-03-12 DIAGNOSIS — M531 Cervicobrachial syndrome: Secondary | ICD-10-CM | POA: Diagnosis not present

## 2013-03-14 DIAGNOSIS — M531 Cervicobrachial syndrome: Secondary | ICD-10-CM | POA: Diagnosis not present

## 2013-03-14 DIAGNOSIS — M5137 Other intervertebral disc degeneration, lumbosacral region: Secondary | ICD-10-CM | POA: Diagnosis not present

## 2013-03-14 DIAGNOSIS — M999 Biomechanical lesion, unspecified: Secondary | ICD-10-CM | POA: Diagnosis not present

## 2013-03-14 DIAGNOSIS — M5430 Sciatica, unspecified side: Secondary | ICD-10-CM | POA: Diagnosis not present

## 2013-03-14 DIAGNOSIS — M9981 Other biomechanical lesions of cervical region: Secondary | ICD-10-CM | POA: Diagnosis not present

## 2013-03-19 DIAGNOSIS — M999 Biomechanical lesion, unspecified: Secondary | ICD-10-CM | POA: Diagnosis not present

## 2013-03-19 DIAGNOSIS — M5137 Other intervertebral disc degeneration, lumbosacral region: Secondary | ICD-10-CM | POA: Diagnosis not present

## 2013-03-19 DIAGNOSIS — M5430 Sciatica, unspecified side: Secondary | ICD-10-CM | POA: Diagnosis not present

## 2013-03-19 DIAGNOSIS — M9981 Other biomechanical lesions of cervical region: Secondary | ICD-10-CM | POA: Diagnosis not present

## 2013-03-19 DIAGNOSIS — M531 Cervicobrachial syndrome: Secondary | ICD-10-CM | POA: Diagnosis not present

## 2013-03-21 DIAGNOSIS — M999 Biomechanical lesion, unspecified: Secondary | ICD-10-CM | POA: Diagnosis not present

## 2013-03-21 DIAGNOSIS — M531 Cervicobrachial syndrome: Secondary | ICD-10-CM | POA: Diagnosis not present

## 2013-03-21 DIAGNOSIS — M5430 Sciatica, unspecified side: Secondary | ICD-10-CM | POA: Diagnosis not present

## 2013-03-21 DIAGNOSIS — M5137 Other intervertebral disc degeneration, lumbosacral region: Secondary | ICD-10-CM | POA: Diagnosis not present

## 2013-03-21 DIAGNOSIS — M9981 Other biomechanical lesions of cervical region: Secondary | ICD-10-CM | POA: Diagnosis not present

## 2013-03-26 ENCOUNTER — Telehealth: Payer: Self-pay | Admitting: Internal Medicine

## 2013-03-26 DIAGNOSIS — M5430 Sciatica, unspecified side: Secondary | ICD-10-CM | POA: Diagnosis not present

## 2013-03-26 DIAGNOSIS — M5137 Other intervertebral disc degeneration, lumbosacral region: Secondary | ICD-10-CM | POA: Diagnosis not present

## 2013-03-26 DIAGNOSIS — M531 Cervicobrachial syndrome: Secondary | ICD-10-CM | POA: Diagnosis not present

## 2013-03-26 DIAGNOSIS — M9981 Other biomechanical lesions of cervical region: Secondary | ICD-10-CM | POA: Diagnosis not present

## 2013-03-26 DIAGNOSIS — M999 Biomechanical lesion, unspecified: Secondary | ICD-10-CM | POA: Diagnosis not present

## 2013-03-26 NOTE — Telephone Encounter (Signed)
Patient Information:  Caller Name: Marchelle  Phone: [phone]  Patient: Jodi Beasley  Gender: Female  DOB: 1945/12/16  Age: 67 Years  PCP: Ronna Polio (Adults only)  Office Follow Up:  Does the office need to follow up with this patient?: No  Instructions For The Office: N/A   Symptoms  Reason For Call & Symptoms: Sciatiica onset 5 weeks ago, saw Raquel Rey  Reviewed Health History In EMR: Yes  Reviewed Medications In EMR: Yes  Reviewed Allergies In EMR: Yes  Reviewed Surgeries / Procedures: Yes  Date of Onset of Symptoms: 02/19/2013  Guideline(s) Used:  Back Injury  Back Pain  Disposition Per Guideline:   See Today or Tomorrow in Office  Reason For Disposition Reached:   Pain radiates into the thigh or further down the leg  Advice Given:  Reassurance:  With treatment, the pain most often goes away in 1-2 weeks.  You can treat most back pain at home.  Here is some care advice that should help.  Cold or Heat:  Cold Pack: For pain or swelling, use a cold pack or ice wrapped in a wet cloth. Put it on the sore area for 20 minutes. Repeat 4 times on the first day, then as needed.  Heat Pack: If pain lasts over 2 days, apply heat to the sore area. Use a heat pack, heating pad, or warm wet washcloth. Do this for 10 minutes, then as needed. For widespread stiffness, take a hot bath or hot shower instead. Move the sore area under the warm water.  Pain Medicines:  For pain relief, take acetaminophen, ibuprofen, or naproxen.  Use the lowest amount of medicine that makes your pain feel better.  Acetaminophen (e.g., Tylenol):  Take 650 mg (two 325 mg pills) by mouth every 4-6 hours as needed. Each Regular Strength Tylenol pill has 325 mg of acetaminophen. The most you should take each day is 3,250 mg (10 Regular Strength pills a day).  Another choice is to take 1,000 mg every 8 hours as needed. Each Extra Strength Tylenol pill has 500 mg of acetaminophen. The most you should  take each day is 3,000 mg (6 Extra Strength pills a day).  Call Back If:  Numbness or weakness occur  Bowel/bladder problems occur  Pain lasts for more than 2 weeks  You become worse.  Patient Will Follow Care Advice:  YES  Appointment Scheduled:  03/29/2013 09:15:00 Appointment Scheduled Provider:  Ronna Polio (Adults only)

## 2013-03-26 NOTE — Telephone Encounter (Signed)
Spoked with patient, she state she seen Jodi Beasley on 3/28 and has been seeing a Land. She stated they faxed over some information about this, she is getting better and went for an adjustment. Jodi Beasley gave her Hydrocodone and she has enough to last her until Friday, if not then she will just take her [name] she has at home. Suggested if the pain is that severe or if she feels she can not wait until Friday then go to urgent care or ER.

## 2013-03-27 NOTE — Telephone Encounter (Signed)
Pt will need to be seen if symptoms severe. We can work her in on Friday. Appointment is scheduled

## 2013-03-27 NOTE — Telephone Encounter (Signed)
Patient already aware of appointment on Friday

## 2013-03-27 NOTE — Telephone Encounter (Signed)
Left message to call back  

## 2013-03-28 DIAGNOSIS — M999 Biomechanical lesion, unspecified: Secondary | ICD-10-CM | POA: Diagnosis not present

## 2013-03-28 DIAGNOSIS — M531 Cervicobrachial syndrome: Secondary | ICD-10-CM | POA: Diagnosis not present

## 2013-03-28 DIAGNOSIS — M5430 Sciatica, unspecified side: Secondary | ICD-10-CM | POA: Diagnosis not present

## 2013-03-28 DIAGNOSIS — M9981 Other biomechanical lesions of cervical region: Secondary | ICD-10-CM | POA: Diagnosis not present

## 2013-03-28 DIAGNOSIS — M5137 Other intervertebral disc degeneration, lumbosacral region: Secondary | ICD-10-CM | POA: Diagnosis not present

## 2013-03-29 ENCOUNTER — Ambulatory Visit (INDEPENDENT_AMBULATORY_CARE_PROVIDER_SITE_OTHER): Payer: Medicare Other | Admitting: Internal Medicine

## 2013-03-29 ENCOUNTER — Encounter: Payer: Self-pay | Admitting: Internal Medicine

## 2013-03-29 VITALS — BP 140/88 | HR 72 | Temp 98.5°F | Wt 178.0 lb

## 2013-03-29 DIAGNOSIS — M5430 Sciatica, unspecified side: Secondary | ICD-10-CM | POA: Diagnosis not present

## 2013-03-29 DIAGNOSIS — M5432 Sciatica, left side: Secondary | ICD-10-CM

## 2013-03-29 MED ORDER — TRAMADOL HCL 50 MG PO TABS: 50.0000 mg | ORAL_TABLET | Freq: Three times a day (TID) | ORAL | Status: DC | PRN

## 2013-03-29 MED ORDER — LEVOTHYROXINE SODIUM 112 MCG PO TABS: ORAL_TABLET | ORAL | Status: DC

## 2013-03-29 MED ORDER — PREDNISONE (PAK) 10 MG PO TABS: ORAL_TABLET | ORAL | Status: DC

## 2013-03-29 MED ORDER — OMEPRAZOLE 20 MG PO CPDR: DELAYED_RELEASE_CAPSULE | ORAL | Status: DC

## 2013-03-29 MED ORDER — CITALOPRAM HYDROBROMIDE 20 MG PO TABS: 20.0000 mg | ORAL_TABLET | Freq: Every day | ORAL | Status: DC

## 2013-03-29 NOTE — Assessment & Plan Note (Signed)
Patient with chronic low back pain and acute on chronic worsening of left sciatic pain. Encouraged her to continue with chiropractic care. Will start prednisone taper to help with inflammation. Will try adding tramadol to help with pain. We discussed that if pain is persistent, she will need referral to pain management for ongoing evaluation and treatment. No refill on Hydrocodone today, as recently refilled 4/3 for 45pills.

## 2013-03-29 NOTE — Patient Instructions (Signed)
Start prednisone taper to help with sciatic pain.  Use Tramadol 50mg  up to three times daily to help with pain.

## 2013-03-29 NOTE — Progress Notes (Signed)
Subjective:    Patient ID: Jodi Beasley, female    DOB: [birth year], 67 y.o.   MRN: [id number]  HPI 67 year old female with history of COPD, CAD presents for acute visit complaining of low back and left-sided sciatic pain. She has chronic issues with low back pain for which she is followed by a chiropractor. Over the last couple of weeks, she reports worsening pain in her lower back radiating down the back of her left leg. This initially occurred after lifting her grandchild. Symptoms have been improving with chiropractic care. She was also seen in our office and started on hydrocodone for severe pain. She reports that she has been using soma and hydrocodone as needed for severe pain with some improvement. She denies any weakness or numbness in her left leg.  Outpatient Encounter Prescriptions as of 03/29/2013  Medication Sig Dispense Refill  . albuterol (PROAIR HFA) 108 (90 BASE) MCG/ACT inhaler       . aspirin 81 MG tablet Take 81 mg by mouth daily.      Marland Kitchen azelastine (ASTELIN) 137 MCG/SPRAY nasal spray Place 1 spray into the nose 2 (two) times daily. Use in each nostril as directed  30 mL  6  . b complex vitamins tablet Take 1 tablet by mouth daily.      . budesonide-formoterol (SYMBICORT) 160-4.5 MCG/ACT inhaler Inhale 2 puffs into the lungs 2 (two) times daily.      . carisoprodol (SOMA) 350 MG tablet Take 1 tablet (350 mg total) by mouth 2 (two) times daily as needed for muscle spasms.  60 tablet  3  . citalopram (CELEXA) 20 MG tablet Take 1 tablet (20 mg total) by mouth daily.  30 tablet  6  . cloNIDine (CATAPRES) 0.1 MG tablet Take 0.1 mg by mouth daily as needed.      . Coenzyme Q-10 400 MG CAPS Take 400 mg by mouth 2 (two) times daily.      . CRESTOR 40 MG tablet TAKE 1 TABLET BY MOUTH EVERY DAY  30 tablet  0  . fluticasone (FLONASE) 50 MCG/ACT nasal spray USE 2 SPRAYS IN EACH NOSTRIL EVERY DAY  16 g  3  . Glucosamine-Chondroit-Vit C-Mn (GLUCOSAMINE CHONDR 1500 COMPLX PO) Take 1 tablet by  mouth daily.      Marland Kitchen levothyroxine (SYNTHROID, LEVOTHROID) 112 MCG tablet TAKE 1 TABLET (112 MCG TOTAL) BY MOUTH DAILY.  30 tablet  6  . metoprolol (LOPRESSOR) 100 MG tablet TAKE 1 TABLET (100 MG TOTAL) BY MOUTH 2 (TWO) TIMES DAILY.  180 tablet  3  . Multiple Vitamin (MULTIVITAMIN) capsule Take 1 capsule by mouth daily.      . nicotine (NICOTROL) 10 MG inhaler Inhale 1 puff into the lungs as needed for smoking cessation.  42 each  0  . nitroGLYCERIN (NITROSTAT) 0.4 MG SL tablet Place 0.4 mg under the tongue every 5 (five) minutes as needed.      . Omega-3 Fatty Acids (FISH OIL) 1200 MG CAPS Take 1 capsule by mouth daily.      Marland Kitchen omeprazole (PRILOSEC) 20 MG capsule TAKE ONE CAPSULE BY MOUTH EVERY DAY  90 capsule  3  . Potassium Gluconate 550 MG TABS Take 1 tablet by mouth 2 (two) times daily.       Marland Kitchen UNABLE TO FIND Med Name: Sinus and Allergy relief PE Per box directions as needed      . HYDROcodone-acetaminophen (NORCO/VICODIN) 5-325 MG per tablet Take 1 tablet by mouth every 6 (six) hours as  needed for pain.  45 tablet  0  . predniSONE (STERAPRED UNI-PAK) 10 MG tablet Take 60mg  day 1 then taper by 10mg  daily  21 tablet  0  . traMADol (ULTRAM) 50 MG tablet Take 1 tablet (50 mg total) by mouth every 8 (eight) hours as needed for pain.  90 tablet  3   No facility-administered encounter medications on file as of 03/29/2013.   BP 140/88  Pulse 72  Temp(Src) 98.5 F (36.9 C) (Oral)  Wt 178 lb (80.74 kg)  BMI 29.16 kg/m2  SpO2 96%  Review of Systems  Constitutional: Negative for fever, chills, appetite change, fatigue and unexpected weight change.  HENT: Negative for neck pain.   Eyes: Negative for visual disturbance.  Respiratory: Negative for cough, shortness of breath, wheezing and stridor.   Cardiovascular: Negative for chest pain, palpitations and leg swelling.  Gastrointestinal: Negative for abdominal pain and abdominal distention.  Genitourinary: Negative for dysuria and flank pain.    Musculoskeletal: Positive for myalgias, back pain and arthralgias. Negative for gait problem.  Skin: Negative for color change and rash.  Neurological: Negative for dizziness, weakness, numbness and headaches.  Hematological: Negative for adenopathy. Does not bruise/bleed easily.  Psychiatric/Behavioral: Negative for suicidal ideas, sleep disturbance and dysphoric mood. The patient is not nervous/anxious.        Objective:   Physical Exam  Constitutional: She is oriented to person, place, and time. She appears well-developed and well-nourished. No distress.  HENT:  Head: Normocephalic and atraumatic.  Right Ear: External ear normal.  Left Ear: External ear normal.  Nose: Nose normal.  Mouth/Throat: Oropharynx is clear and moist. No oropharyngeal exudate.  Eyes: Conjunctivae are normal. Pupils are equal, round, and reactive to light. Right eye exhibits no discharge. Left eye exhibits no discharge. No scleral icterus.  Neck: Normal range of motion. Neck supple. No tracheal deviation present. No thyromegaly present.  Pulmonary/Chest: Effort normal.  Musculoskeletal: Normal range of motion. She exhibits no edema and no tenderness.       Lumbar back: She exhibits tenderness and pain. She exhibits normal range of motion, no bony tenderness, no swelling, no edema and no spasm.       Back:  Lymphadenopathy:    She has no cervical adenopathy.  Neurological: She is alert and oriented to person, place, and time. No cranial nerve deficit. She exhibits normal muscle tone. Coordination normal.  Skin: Skin is warm and dry. No rash noted. She is not diaphoretic. No erythema. No pallor.  Psychiatric: She has a normal mood and affect. Her behavior is normal. Judgment and thought content normal.          Assessment & Plan:

## 2013-04-01 ENCOUNTER — Encounter: Payer: Self-pay | Admitting: Internal Medicine

## 2013-04-02 DIAGNOSIS — M9981 Other biomechanical lesions of cervical region: Secondary | ICD-10-CM | POA: Diagnosis not present

## 2013-04-02 DIAGNOSIS — M5137 Other intervertebral disc degeneration, lumbosacral region: Secondary | ICD-10-CM | POA: Diagnosis not present

## 2013-04-02 DIAGNOSIS — M999 Biomechanical lesion, unspecified: Secondary | ICD-10-CM | POA: Diagnosis not present

## 2013-04-02 DIAGNOSIS — M531 Cervicobrachial syndrome: Secondary | ICD-10-CM | POA: Diagnosis not present

## 2013-04-02 DIAGNOSIS — M5430 Sciatica, unspecified side: Secondary | ICD-10-CM | POA: Diagnosis not present

## 2013-04-04 DIAGNOSIS — M5137 Other intervertebral disc degeneration, lumbosacral region: Secondary | ICD-10-CM | POA: Diagnosis not present

## 2013-04-04 DIAGNOSIS — M999 Biomechanical lesion, unspecified: Secondary | ICD-10-CM | POA: Diagnosis not present

## 2013-04-04 DIAGNOSIS — M531 Cervicobrachial syndrome: Secondary | ICD-10-CM | POA: Diagnosis not present

## 2013-04-04 DIAGNOSIS — M5430 Sciatica, unspecified side: Secondary | ICD-10-CM | POA: Diagnosis not present

## 2013-04-04 DIAGNOSIS — M9981 Other biomechanical lesions of cervical region: Secondary | ICD-10-CM | POA: Diagnosis not present

## 2013-04-08 DIAGNOSIS — M5430 Sciatica, unspecified side: Secondary | ICD-10-CM | POA: Diagnosis not present

## 2013-04-08 DIAGNOSIS — M9981 Other biomechanical lesions of cervical region: Secondary | ICD-10-CM | POA: Diagnosis not present

## 2013-04-08 DIAGNOSIS — M999 Biomechanical lesion, unspecified: Secondary | ICD-10-CM | POA: Diagnosis not present

## 2013-04-08 DIAGNOSIS — M531 Cervicobrachial syndrome: Secondary | ICD-10-CM | POA: Diagnosis not present

## 2013-04-08 DIAGNOSIS — M5137 Other intervertebral disc degeneration, lumbosacral region: Secondary | ICD-10-CM | POA: Diagnosis not present

## 2013-04-16 DIAGNOSIS — M5137 Other intervertebral disc degeneration, lumbosacral region: Secondary | ICD-10-CM | POA: Diagnosis not present

## 2013-04-16 DIAGNOSIS — M531 Cervicobrachial syndrome: Secondary | ICD-10-CM | POA: Diagnosis not present

## 2013-04-16 DIAGNOSIS — M5430 Sciatica, unspecified side: Secondary | ICD-10-CM | POA: Diagnosis not present

## 2013-04-16 DIAGNOSIS — M9981 Other biomechanical lesions of cervical region: Secondary | ICD-10-CM | POA: Diagnosis not present

## 2013-04-16 DIAGNOSIS — M999 Biomechanical lesion, unspecified: Secondary | ICD-10-CM | POA: Diagnosis not present

## 2013-04-23 DIAGNOSIS — M5137 Other intervertebral disc degeneration, lumbosacral region: Secondary | ICD-10-CM | POA: Diagnosis not present

## 2013-04-23 DIAGNOSIS — M9981 Other biomechanical lesions of cervical region: Secondary | ICD-10-CM | POA: Diagnosis not present

## 2013-04-23 DIAGNOSIS — M5430 Sciatica, unspecified side: Secondary | ICD-10-CM | POA: Diagnosis not present

## 2013-04-23 DIAGNOSIS — M531 Cervicobrachial syndrome: Secondary | ICD-10-CM | POA: Diagnosis not present

## 2013-04-23 DIAGNOSIS — M999 Biomechanical lesion, unspecified: Secondary | ICD-10-CM | POA: Diagnosis not present

## 2013-05-02 ENCOUNTER — Encounter: Payer: Self-pay | Admitting: Internal Medicine

## 2013-05-02 ENCOUNTER — Ambulatory Visit (INDEPENDENT_AMBULATORY_CARE_PROVIDER_SITE_OTHER): Payer: Medicare Other | Admitting: Internal Medicine

## 2013-05-02 VITALS — BP 122/78 | HR 61 | Temp 98.6°F | Wt 175.0 lb

## 2013-05-02 DIAGNOSIS — F411 Generalized anxiety disorder: Secondary | ICD-10-CM

## 2013-05-02 DIAGNOSIS — F419 Anxiety disorder, unspecified: Secondary | ICD-10-CM

## 2013-05-02 DIAGNOSIS — M5430 Sciatica, unspecified side: Secondary | ICD-10-CM | POA: Diagnosis not present

## 2013-05-02 MED ORDER — CITALOPRAM HYDROBROMIDE 40 MG PO TABS: 40.0000 mg | ORAL_TABLET | Freq: Every day | ORAL | Status: DC

## 2013-05-02 NOTE — Assessment & Plan Note (Signed)
Recent worsening of symptoms. Will increase Citalopram to 40mg  daily. Encouraged referral for counseling, however pt declined. Follow up 3 months and prn.
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
## 2013-05-02 NOTE — Assessment & Plan Note (Signed)
Symptoms have improved after prednisone taper and chiropractic care. Will continue to monitor.

## 2013-05-07 DIAGNOSIS — M5430 Sciatica, unspecified side: Secondary | ICD-10-CM | POA: Diagnosis not present

## 2013-05-07 DIAGNOSIS — M531 Cervicobrachial syndrome: Secondary | ICD-10-CM | POA: Diagnosis not present

## 2013-05-07 DIAGNOSIS — M5137 Other intervertebral disc degeneration, lumbosacral region: Secondary | ICD-10-CM | POA: Diagnosis not present

## 2013-05-07 DIAGNOSIS — M9981 Other biomechanical lesions of cervical region: Secondary | ICD-10-CM | POA: Diagnosis not present

## 2013-05-07 DIAGNOSIS — M999 Biomechanical lesion, unspecified: Secondary | ICD-10-CM | POA: Diagnosis not present

## 2013-05-14 DIAGNOSIS — H43819 Vitreous degeneration, unspecified eye: Secondary | ICD-10-CM | POA: Diagnosis not present

## 2013-05-16 ENCOUNTER — Telehealth: Payer: Self-pay | Admitting: Internal Medicine

## 2013-05-16 NOTE — Telephone Encounter (Signed)
Pt called stating she went to [facility] and they found a spot on her macular part of the eye.  She needs a referral

## 2013-05-16 NOTE — Telephone Encounter (Signed)
Left message to call back  

## 2013-05-20 DIAGNOSIS — M9981 Other biomechanical lesions of cervical region: Secondary | ICD-10-CM | POA: Diagnosis not present

## 2013-05-20 DIAGNOSIS — M5430 Sciatica, unspecified side: Secondary | ICD-10-CM | POA: Diagnosis not present

## 2013-05-20 DIAGNOSIS — M999 Biomechanical lesion, unspecified: Secondary | ICD-10-CM | POA: Diagnosis not present

## 2013-05-20 DIAGNOSIS — M531 Cervicobrachial syndrome: Secondary | ICD-10-CM | POA: Diagnosis not present

## 2013-05-20 DIAGNOSIS — M5137 Other intervertebral disc degeneration, lumbosacral region: Secondary | ICD-10-CM | POA: Diagnosis not present

## 2013-05-20 NOTE — Telephone Encounter (Signed)
Spoke with patient she would like a referral to an opthamologist. Informed patient she does not need a referral for this, she can just call them herself. Told her to call [facility], Dr. Dorcas Mcmurray

## 2013-05-21 ENCOUNTER — Ambulatory Visit (INDEPENDENT_AMBULATORY_CARE_PROVIDER_SITE_OTHER): Payer: Medicare Other | Admitting: Pulmonary Disease

## 2013-05-21 ENCOUNTER — Encounter: Payer: Self-pay | Admitting: Pulmonary Disease

## 2013-05-21 VITALS — BP 130/80 | HR 63 | Temp 97.9°F | Ht 66.0 in | Wt 173.0 lb

## 2013-05-21 DIAGNOSIS — J309 Allergic rhinitis, unspecified: Secondary | ICD-10-CM | POA: Diagnosis not present

## 2013-05-21 DIAGNOSIS — Z720 Tobacco use: Secondary | ICD-10-CM

## 2013-05-21 DIAGNOSIS — J4489 Other specified chronic obstructive pulmonary disease: Secondary | ICD-10-CM

## 2013-05-21 DIAGNOSIS — J449 Chronic obstructive pulmonary disease, unspecified: Secondary | ICD-10-CM | POA: Diagnosis not present

## 2013-05-21 DIAGNOSIS — F17200 Nicotine dependence, unspecified, uncomplicated: Secondary | ICD-10-CM

## 2013-05-21 MED ORDER — TIOTROPIUM BROMIDE MONOHYDRATE 18 MCG IN CAPS: 18.0000 ug | ORAL_CAPSULE | Freq: Every day | RESPIRATORY_TRACT | Status: DC

## 2013-05-21 MED ORDER — ALBUTEROL SULFATE HFA 108 (90 BASE) MCG/ACT IN AERS: 2.0000 | INHALATION_SPRAY | Freq: Four times a day (QID) | RESPIRATORY_TRACT | Status: DC | PRN

## 2013-05-21 MED ORDER — AZELASTINE HCL 0.1 % NA SOLN: 1.0000 | Freq: Two times a day (BID) | NASAL | Status: DC

## 2013-05-21 NOTE — Assessment & Plan Note (Signed)
We discussed this at length again today. She thinks she is to start using her Nicotrol inhaler.

## 2013-05-21 NOTE — Assessment & Plan Note (Signed)
She is frustrated by these ongoing symptoms despite the use of antihistamines, mucolytic, nasal antihistamines, and nasal steroids. I question her compliance with medications. She would like to have an allergy evaluation which I think is reasonable.

## 2013-05-21 NOTE — Assessment & Plan Note (Signed)
Jodi Beasley certainly not having an exacerbation of COPD that she is frustrated by her ongoing sputum production. This is consistent with chronic bronchitis which is due solely because of her ongoing smoking.  Plan: -Continue current inhaled medication -I reviewed the importance of quitting smoking extensively.

## 2013-05-21 NOTE — Progress Notes (Signed)
Subjective:    Patient ID: Jodi Beasley, female    DOB: [birth year], 67 y.o.   MRN: [id number]  Synopsis: Jodi Beasley established care with the [facility] pulmonary clinic in May 2013 for COPD. She simple spirometry at that time with a ratio of 71%, the flow volume loop consistent with obstruction and an FEV1 of 1.92 L or 77% predicted. She was followed previously by Dr. Alphonzo Grieve, a pulmonologist in the [place], [place] area. She had been treated for pneumonia 3 weeks prior to her initial visit.  She has smoked one half pack of cigarettes per day for 53 years and continues to smoke.  HPI    7/8 ROV -- Jodi Beasley returns to clinic today for evaluation of her COPD. She states that she had an episode of bronchitis in May which improved with treatment with antibiotics. She do not now how to get a hold of [place] so she ended up going to urgent care. Since then she states her symptoms have improved dramatically and she is not having to use her rescue inhaler much. She is using Astelin nasal spray more often than the Nasonex and she says it worked quite well. She really only has her breath in the heat and humidity. She continues to smoke and is frustrated with her inability to quit. She states that nicotine replacement is helped most of the past.   10/16/2012 ROV --Jodi Beasley returns to clinic today for followup on her COPD. She says that she did not like to [place] and that she prefers to take the Symbicort. She currently uses 1 puff in the morning and a second later in the evening. She gets relief of shortness of breath with this. She usually has to use one dose of albuterol before going to bed. When the ragweed was out she was having to use albuterol more often. She has noted an increase in chest congestion in the last few days. She denies fevers, chills, or shortness of breath.  She continues to smoke cigarettes. She is smoking 6-10 cigarettes a day. After the last visit she did not call us to tell  us that the nicotine inhaler we gave her was not to her liking.   She has not had flares of bronchitis since the last visit.  01/21/2013 ROV -- Since our last visit Jodi Beasley has been struggling with mucus production both from her nose and lungs. She states that she has had multiple family members that have been sick and she has been in and out of hospitals. She has suffered from sinus congestion, runny nose, phlegm in her throat, and sputum production. This is been associated with a mild increase in her baseline dyspnea and wheezing. She has only been taking over-the-counter Mucinex and dextromethorphan. She also notes watery eyes and itchy eyes associated with the symptoms. She is not using saline rinses. She is using Flonase and Astelin. She has stopped taking Allegra tablets because she says "they don't work". She denies fevers chills or chest pain. Several weeks ago she was treated with doxycycline but she states that this did not help.  02/19/2013 ROV -- Jodi Beasley is doing much better since her last visit. Her sinus congestion has improved significantly and she now feels that she is able to clear some of the chest congestion. Her shortness of breath is gradually improving but she still feels short of breath with chasing after her grandchildren and running a vacuum cleaner. She continues to smoke several cigarettes per day. She purchased a  Nicotrol inhaler but she has not started using it yet.  05/21/2013 ROV -- Jodi Beasley has been doing fairly well since the last visit. She still has some shortness of breath when she is out in the heat but when she is in her home or in a store with chronic control she does quite well. She is frustrated by the fact that she still has clear to yellow sputum production all day. She states that this is sometimes associated with nasal congestion and a runny nose but not every time. She does state that when she is exposed to [facility] she has increasing itchy eyes, scratchy throat. She  continues to take multiple medications for allergies including antihistamines, nasal rinses, nasal steroids, and nasal antihistamines. She is still smoking.     Past Medical History  Diagnosis Date  . Depression   . Chicken pox   . GERD (gastroesophageal reflux disease)   . Hay fever   . History of blood transfusion   . Pneumonia, organism unspecified   . Hypothyroid   . COPD (chronic obstructive pulmonary disease)   . History of syncope 2000    negative EP study  . History of hypercholesterolemia   . Arrhythmia   . Hyperlipidemia     intolerance to statins except Crestor.   . Coronary artery disease     NSTEMI in 09/2008. Cath : 80% RCA and 95% first diagonal. PCI and 2 DES placement  RCA and 1 DES to diagonal. Complicated by acute diagonal stent thrombosis . Treated by thrombectomy. Most recent nuclear stress test in 2010 showed no ischemia with normal EF.   Marland Kitchen Hypertension   . MI (myocardial infarction)     Review of Systems  Constitutional: Negative for fever, fatigue and unexpected weight change.  HENT: Negative for congestion, rhinorrhea, postnasal drip and sinus pressure.   Respiratory: Positive for cough and shortness of breath. Negative for choking.   Cardiovascular: Negative for chest pain and leg swelling.       Objective:   Physical Exam  Filed Vitals:   05/21/13 1030  BP: 130/80  Pulse: 63  Temp: 97.9 F (36.6 C)  TempSrc: Oral  Height: 5\' 6"  (1.676 m)  Weight: 173 lb (78.472 kg)  SpO2: 96%   room air  Gen: well appearing, no acute distress HEENT: NCAT, EOMi, OP clear,  PULM: Clear to auscultation bilaterally CV: RRR, no mgr, no JVD AB: BS+, soft, nontender, no hsm Ext: warm, no edema, no clubbing, no cyanosis   04/09/2012 Simple Spirometry: Ratio 71%, FEV1 1.92L (77% pred); flow volume loop consistent with obstruction     Assessment & Plan:   COPD (chronic obstructive pulmonary disease) with chronic bronchitis Jodi Beasley certainly not having an  exacerbation of COPD that she is frustrated by her ongoing sputum production. This is consistent with chronic bronchitis which is due solely because of her ongoing smoking.  Plan: -Continue current inhaled medication -I reviewed the importance of quitting smoking extensively.  Tobacco abuse We discussed this at length again today. She thinks she is to start using her Nicotrol inhaler.  Allergic rhinitis She is frustrated by these ongoing symptoms despite the use of antihistamines, mucolytic, nasal antihistamines, and nasal steroids. I question her compliance with medications. She would like to have an allergy evaluation which I think is reasonable.    Updated Medication List Outpatient Encounter Prescriptions as of 05/21/2013  Medication Sig Dispense Refill  . albuterol (PROAIR HFA) 108 (90 BASE) MCG/ACT inhaler 2 puffs every 6 (six)  hours as needed.       Marland Kitchen aspirin 81 MG tablet Take 81 mg by mouth daily.      Marland Kitchen azelastine (ASTELIN) 137 MCG/SPRAY nasal spray Place 1 spray into the nose 2 (two) times daily. Use in each nostril as directed  30 mL  6  . b complex vitamins tablet Take 1 tablet by mouth daily.      . budesonide-formoterol (SYMBICORT) 160-4.5 MCG/ACT inhaler Inhale 2 puffs into the lungs 2 (two) times daily.      . carisoprodol (SOMA) 350 MG tablet Take 1 tablet (350 mg total) by mouth 2 (two) times daily as needed for muscle spasms.  60 tablet  3  . citalopram (CELEXA) 40 MG tablet Take 1 tablet (40 mg total) by mouth daily.  30 tablet  6  . cloNIDine (CATAPRES) 0.1 MG tablet Take 0.1 mg by mouth daily as needed.      . Coenzyme Q-10 400 MG CAPS Take 400 mg by mouth 2 (two) times daily.      . CRESTOR 40 MG tablet TAKE 1 TABLET BY MOUTH EVERY DAY  30 tablet  0  . fluticasone (FLONASE) 50 MCG/ACT nasal spray USE 2 SPRAYS IN EACH NOSTRIL EVERY DAY  16 g  3  . Glucosamine-Chondroit-Vit C-Mn (GLUCOSAMINE CHONDR 1500 COMPLX PO) Take 1 tablet by mouth daily.      Marland Kitchen levothyroxine  (SYNTHROID, LEVOTHROID) 112 MCG tablet TAKE 1 TABLET (112 MCG TOTAL) BY MOUTH DAILY.  30 tablet  6  . metoprolol (LOPRESSOR) 100 MG tablet TAKE 1 TABLET (100 MG TOTAL) BY MOUTH 2 (TWO) TIMES DAILY.  180 tablet  3  . Multiple Vitamin (MULTIVITAMIN) capsule Take 1 capsule by mouth daily.      . nicotine (NICOTROL) 10 MG inhaler Inhale 1 puff into the lungs as needed for smoking cessation.  42 each  0  . nitroGLYCERIN (NITROSTAT) 0.4 MG SL tablet Place 0.4 mg under the tongue every 5 (five) minutes as needed.      . Omega-3 Fatty Acids (FISH OIL) 1200 MG CAPS Take 1 capsule by mouth daily.      Marland Kitchen omeprazole (PRILOSEC) 20 MG capsule TAKE ONE CAPSULE BY MOUTH EVERY DAY  90 capsule  3  . Potassium Gluconate 550 MG TABS Take 1 tablet by mouth 2 (two) times daily.       Marland Kitchen tiotropium (SPIRIVA) 18 MCG inhalation capsule Place 18 mcg into inhaler and inhale daily.      . traMADol (ULTRAM) 50 MG tablet Take 1 tablet (50 mg total) by mouth every 8 (eight) hours as needed for pain.  90 tablet  3  . UNABLE TO FIND Med Name: Sinus and Allergy relief PE Per box directions as needed       No facility-administered encounter medications on file as of 05/21/2013.

## 2013-05-21 NOTE — Patient Instructions (Addendum)
Stop smoking, stop smoking stop smoking  Keep using your inhalers as you are doing  We will refer you to an allergist to evaluate your allergic rhinitis  We will see you back in 4-6 months or sooner if needed

## 2013-05-30 DIAGNOSIS — H35389 Toxic maculopathy, unspecified eye: Secondary | ICD-10-CM | POA: Diagnosis not present

## 2013-06-03 DIAGNOSIS — M531 Cervicobrachial syndrome: Secondary | ICD-10-CM | POA: Diagnosis not present

## 2013-06-03 DIAGNOSIS — M999 Biomechanical lesion, unspecified: Secondary | ICD-10-CM | POA: Diagnosis not present

## 2013-06-03 DIAGNOSIS — M5430 Sciatica, unspecified side: Secondary | ICD-10-CM | POA: Diagnosis not present

## 2013-06-03 DIAGNOSIS — M5137 Other intervertebral disc degeneration, lumbosacral region: Secondary | ICD-10-CM | POA: Diagnosis not present

## 2013-06-03 DIAGNOSIS — M9981 Other biomechanical lesions of cervical region: Secondary | ICD-10-CM | POA: Diagnosis not present

## 2013-06-10 ENCOUNTER — Encounter: Payer: Self-pay | Admitting: Internal Medicine

## 2013-06-12 ENCOUNTER — Telehealth: Payer: Self-pay | Admitting: Pulmonary Disease

## 2013-06-12 NOTE — Telephone Encounter (Signed)
ATC patient at number provided. Phone rang several times with no answer and no option to leave VM [facility]

## 2013-06-13 MED ORDER — BUDESONIDE-FORMOTEROL FUMARATE 160-4.5 MCG/ACT IN AERO: 2.0000 | INHALATION_SPRAY | Freq: Two times a day (BID) | RESPIRATORY_TRACT | Status: DC

## 2013-06-13 NOTE — Telephone Encounter (Signed)
ATC patient at number provided NO answer [id number]

## 2013-06-13 NOTE — Telephone Encounter (Signed)
Pt returned call & can be reached at [phone].  Jodi Beasley

## 2013-06-13 NOTE — Telephone Encounter (Signed)
I spoke with the pt and she states she has applied for patient assistance through [employer] and has completed the paperwork but they are needing a prescription signed and faxed to [phone]. Pt is aware BQ is off until next week. She has plenty of medication she states. Rx has been printed and placed in [place] slot to take next time he is there. Carron Curie, CMA

## 2013-06-14 NOTE — Telephone Encounter (Signed)
I am going to forward this message to [place]. She is going to the one working with BQ on Monday.

## 2013-06-17 NOTE — Telephone Encounter (Signed)
BQ signed rx and I have faxed to pt assistance

## 2013-06-18 ENCOUNTER — Other Ambulatory Visit: Payer: Self-pay | Admitting: *Deleted

## 2013-06-18 MED ORDER — ALBUTEROL SULFATE HFA 108 (90 BASE) MCG/ACT IN AERS: 2.0000 | INHALATION_SPRAY | Freq: Four times a day (QID) | RESPIRATORY_TRACT | Status: DC | PRN

## 2013-06-18 MED ORDER — TIOTROPIUM BROMIDE MONOHYDRATE 18 MCG IN CAPS: 18.0000 ug | ORAL_CAPSULE | Freq: Every day | RESPIRATORY_TRACT | Status: DC

## 2013-06-20 DIAGNOSIS — L570 Actinic keratosis: Secondary | ICD-10-CM | POA: Diagnosis not present

## 2013-06-20 DIAGNOSIS — L819 Disorder of pigmentation, unspecified: Secondary | ICD-10-CM | POA: Diagnosis not present

## 2013-06-20 DIAGNOSIS — L738 Other specified follicular disorders: Secondary | ICD-10-CM | POA: Diagnosis not present

## 2013-06-24 DIAGNOSIS — M999 Biomechanical lesion, unspecified: Secondary | ICD-10-CM | POA: Diagnosis not present

## 2013-06-24 DIAGNOSIS — M531 Cervicobrachial syndrome: Secondary | ICD-10-CM | POA: Diagnosis not present

## 2013-06-24 DIAGNOSIS — M9981 Other biomechanical lesions of cervical region: Secondary | ICD-10-CM | POA: Diagnosis not present

## 2013-06-24 DIAGNOSIS — IMO0002 Reserved for concepts with insufficient information to code with codable children: Secondary | ICD-10-CM | POA: Diagnosis not present

## 2013-06-26 ENCOUNTER — Telehealth: Payer: Self-pay | Admitting: Pulmonary Disease

## 2013-06-26 NOTE — Telephone Encounter (Signed)
LMTCB for Pt assistance

## 2013-06-27 ENCOUNTER — Telehealth: Payer: Self-pay | Admitting: Internal Medicine

## 2013-06-27 NOTE — Telephone Encounter (Signed)
Pt is needing her crestor sent over to Fax [phone] [place] [employer] [place] Perscription savings program. Pt number is [id number]

## 2013-06-27 NOTE — Telephone Encounter (Signed)
LMTCB x 2 for [name]

## 2013-06-28 ENCOUNTER — Telehealth: Payer: Self-pay | Admitting: Pulmonary Disease

## 2013-06-28 MED ORDER — ROSUVASTATIN CALCIUM 40 MG PO TABS: ORAL_TABLET | ORAL | Status: DC

## 2013-06-28 NOTE — Telephone Encounter (Signed)
Rx printed to be signed and manually faxed.

## 2013-06-28 NOTE — Telephone Encounter (Signed)
Message has been left with Gerarda Gunther giving her the information that she request in regards to the pt's Spiriva medication.

## 2013-07-01 NOTE — Telephone Encounter (Signed)
According to epic we did fax the forms from our office. Jodi Beasley has been advised.Carron Curie, CMA

## 2013-07-08 ENCOUNTER — Institutional Professional Consult (permissible substitution): Payer: Medicare Other | Admitting: Internal Medicine

## 2013-07-09 ENCOUNTER — Institutional Professional Consult (permissible substitution): Payer: Medicare Other | Admitting: Internal Medicine

## 2013-07-10 ENCOUNTER — Other Ambulatory Visit: Payer: Self-pay

## 2013-07-26 ENCOUNTER — Emergency Department: Payer: Self-pay | Admitting: Emergency Medicine

## 2013-07-26 DIAGNOSIS — Z79899 Other long term (current) drug therapy: Secondary | ICD-10-CM | POA: Diagnosis not present

## 2013-07-26 DIAGNOSIS — I2510 Atherosclerotic heart disease of native coronary artery without angina pectoris: Secondary | ICD-10-CM | POA: Diagnosis not present

## 2013-07-26 DIAGNOSIS — R0789 Other chest pain: Secondary | ICD-10-CM | POA: Diagnosis not present

## 2013-07-26 DIAGNOSIS — R079 Chest pain, unspecified: Secondary | ICD-10-CM | POA: Diagnosis not present

## 2013-07-26 DIAGNOSIS — F17200 Nicotine dependence, unspecified, uncomplicated: Secondary | ICD-10-CM | POA: Diagnosis not present

## 2013-07-26 DIAGNOSIS — J449 Chronic obstructive pulmonary disease, unspecified: Secondary | ICD-10-CM | POA: Diagnosis not present

## 2013-07-26 DIAGNOSIS — Z95818 Presence of other cardiac implants and grafts: Secondary | ICD-10-CM | POA: Diagnosis not present

## 2013-07-26 LAB — CBC
MCHC: 34.5 g/dL (ref 32.0–36.0)
MCV: 91 fL (ref 80–100)
WBC: 9.2 10*3/uL (ref 3.6–11.0)

## 2013-07-26 LAB — TROPONIN I: Troponin-I: <0.02 ng/mL

## 2013-07-26 LAB — BASIC METABOLIC PANEL
BUN: 11 mg/dL (ref 7–18)
Chloride: 103 mmol/L (ref 98–107)
Co2: 26 mmol/L (ref 21–32)
Creatinine: 0.83 mg/dL (ref 0.60–1.30)
Glucose: 97 mg/dL (ref 65–99)
Potassium: 4.1 mmol/L (ref 3.5–5.1)
Sodium: 133 mmol/L — ABNORMAL LOW (ref 136–145)

## 2013-07-26 LAB — CK TOTAL AND CKMB (NOT AT ARMC)
CK, Total: 127 U/L (ref 21–215)
CK-MB: 2.3 ng/mL (ref 0.5–3.6)

## 2013-08-02 DIAGNOSIS — M531 Cervicobrachial syndrome: Secondary | ICD-10-CM | POA: Diagnosis not present

## 2013-08-02 DIAGNOSIS — M9981 Other biomechanical lesions of cervical region: Secondary | ICD-10-CM | POA: Diagnosis not present

## 2013-08-02 DIAGNOSIS — M999 Biomechanical lesion, unspecified: Secondary | ICD-10-CM | POA: Diagnosis not present

## 2013-08-02 DIAGNOSIS — IMO0002 Reserved for concepts with insufficient information to code with codable children: Secondary | ICD-10-CM | POA: Diagnosis not present

## 2013-08-06 ENCOUNTER — Ambulatory Visit: Payer: Medicare Other | Admitting: Internal Medicine

## 2013-08-08 ENCOUNTER — Encounter: Payer: Self-pay | Admitting: *Deleted

## 2013-08-09 ENCOUNTER — Ambulatory Visit: Payer: Medicare Other | Admitting: Internal Medicine

## 2013-08-12 DIAGNOSIS — M531 Cervicobrachial syndrome: Secondary | ICD-10-CM | POA: Diagnosis not present

## 2013-08-12 DIAGNOSIS — M999 Biomechanical lesion, unspecified: Secondary | ICD-10-CM | POA: Diagnosis not present

## 2013-08-12 DIAGNOSIS — M9981 Other biomechanical lesions of cervical region: Secondary | ICD-10-CM | POA: Diagnosis not present

## 2013-08-12 DIAGNOSIS — IMO0002 Reserved for concepts with insufficient information to code with codable children: Secondary | ICD-10-CM | POA: Diagnosis not present

## 2013-08-15 ENCOUNTER — Ambulatory Visit (INDEPENDENT_AMBULATORY_CARE_PROVIDER_SITE_OTHER): Payer: Medicare Other | Admitting: Cardiovascular Disease

## 2013-08-15 ENCOUNTER — Ambulatory Visit: Payer: Medicare Other | Admitting: Cardiovascular Disease

## 2013-08-15 ENCOUNTER — Encounter: Payer: Self-pay | Admitting: Cardiovascular Disease

## 2013-08-15 VITALS — BP 132/70 | HR 65 | Ht 66.0 in | Wt 171.0 lb

## 2013-08-15 DIAGNOSIS — E785 Hyperlipidemia, unspecified: Secondary | ICD-10-CM | POA: Diagnosis not present

## 2013-08-15 DIAGNOSIS — I2510 Atherosclerotic heart disease of native coronary artery without angina pectoris: Secondary | ICD-10-CM

## 2013-08-15 DIAGNOSIS — I10 Essential (primary) hypertension: Secondary | ICD-10-CM

## 2013-08-15 NOTE — Patient Instructions (Addendum)
[facility] MYOVIEW  Your caregiver has ordered a Stress Test with nuclear imaging. The purpose of this test is to evaluate the blood supply to your heart muscle. This procedure is referred to as a "Non-Invasive Stress Test." This is because other than having an IV started in your vein, nothing is inserted or "invades" your body. Cardiac stress tests are done to find areas of poor blood flow to the heart by determining the extent of coronary artery disease (CAD). Some patients exercise on a treadmill, which naturally increases the blood flow to your heart, while others who are  unable to walk on a treadmill due to physical limitations have a pharmacologic/chemical stress agent called Lexiscan . This medicine will mimic walking on a treadmill by temporarily increasing your coronary blood flow.   Please note: these test may take anywhere between 2-4 hours to complete  PLEASE REPORT TO [facility] ENTRANCE  THE VOLUNTEERS AT THE FIRST DESK WILL DIRECT YOU WHERE TO GO  Date of Procedure:____________Thursday 9/18/14_________________________  Arrival Time for Procedure:________7:00 am______________________  Instructions regarding medication:   ____ : Hold diabetes medication morning of procedure  __x__:  Hold betablocker(s) night before procedure and morning of procedure (metoprolol)  ____:  Hold other medications as follows:_________________________________________________________________________________________________________________________________________________________________________________________________________________________________________________________________________________________  PLEASE NOTIFY THE OFFICE AT LEAST 24 HOURS IN ADVANCE IF YOU ARE UNABLE TO KEEP YOUR APPOINTMENT.  [phone] AND  PLEASE NOTIFY NUCLEAR MEDICINE AT [facility] AT LEAST 24 HOURS IN ADVANCE IF YOU ARE UNABLE TO KEEP YOUR APPOINTMENT. [phone]  How to prepare for your Myoview test:  1. Do not eat or  drink after midnight 2. No caffeine for 24 hours prior to test 3. No smoking 24 hours prior to test. 4. Your medication may be taken with water.  If your doctor stopped a medication because of this test, do not take that medication. 5. Ladies, please do not wear dresses.  Skirts or pants are appropriate. Please wear a short sleeve shirt. 6. No perfume, cologne or lotion. 7. Wear comfortable walking shoes. No heels!   Your physician wants you to follow-up in: 6 months with Dr. Kirke Corin. You will receive a reminder letter in the mail two months in advance. If you don't receive a letter, please call our office to schedule the follow-up appointment.

## 2013-08-15 NOTE — Assessment & Plan Note (Signed)
She had recent emergency room visit for chest discomfort which responded to nitroglycerin with no evidence of myocardial injury. She is known to have coronary artery disease with previous stenting. Current EKG is normal. I recommend evaluation with a pharmacologic nuclear stress test. The patient is not able to exercise on the treadmill due to lower back pain and dyspnea. Some of her symptoms might be related to stress and anxiety.

## 2013-08-15 NOTE — Progress Notes (Signed)
HPI  This is a 67 year old female who is here for a followup visit regarding CAD which was treated in [place] in the past before she moved to the area. She presented in October 2009  with a small non-ST elevation myocardial infarction. Cardiac catheterization showed significant two-vessel coronary artery disease in the RCA as well as first diagonal. She underwent an angioplasty and drug-eluting stent placement x2 to the RCA and 1 stent to the first diagonal. Shortly after the procedure and while she was in the holding area, she developed acute chest pain with lateral ST elevation. She was taken back to the cardiac cath lab where she was found to have acute stent thrombosis in the first diagonal. She underwent thrombectomy and balloon angioplasty. She took Plavix for about a year and a half after that. She has not had any cardiac events since then. She does have history of COPD and has cut down smoking to about 6 cigarettes a day. She's not able to quit at this time. Most recent stress test on record was in 2010 which showed no evidence of ischemia with an ejection fraction of 69%. However, she reports that she had one done in January of 2013. She has known history of hyperlipidemia with intolerance to multiple statins. She has been able to tolerate Crestor. She went to the emergency room at [facility] recently with substernal chest discomfort described as sharp pain with no radiation. This was associated with shortness of breath and dizziness. She took nitroglycerin in route to the emergency room with resolution of symptoms. By the time she arrived to the emergency room she was chest pain-free. She was slightly hypotensive. Labs were unremarkable including negative cardiac enzymes. ECG showed no acute changes. She was discharged home and instructed to followup. 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   Allergies  Allergen Reactions  . Benzocaine     Tongue swelling  .  Darvon [Propoxyphene Hcl]     HA  . Neosporin [Neomycin-Bacitracin Zn-Polymyx]     blisters  . Nicoderm [Nicotine]     arrythmia     Current Outpatient Prescriptions on File Prior to Visit  Medication Sig Dispense Refill  . albuterol (PROAIR HFA) 108 (90 BASE) MCG/ACT inhaler Inhale 2 puffs into the lungs every 6 (six) hours as needed.  18 g  12  . aspirin 81 MG tablet Take 81 mg by mouth daily.      Marland Kitchen azelastine (ASTELIN) 137 MCG/SPRAY nasal spray Place 1 spray into the nose 2 (two) times daily. Use in each nostril as directed  30 mL  5  . b complex vitamins tablet Take 1 tablet by mouth daily.      . budesonide-formoterol (SYMBICORT) 160-4.5 MCG/ACT inhaler Inhale 2 puffs into the lungs 2 (two) times daily.  3 Inhaler  3  . carisoprodol (SOMA) 350 MG tablet Take 1 tablet (350 mg total) by mouth 2 (two) times daily as needed for muscle spasms.  60 tablet  3  . citalopram (CELEXA) 40 MG tablet Take 1 tablet (40 mg total) by mouth daily.  30 tablet  6  . Coenzyme Q-10 400 MG CAPS Take 400 mg by mouth 2 (two) times daily.      . fluticasone (FLONASE) 50 MCG/ACT nasal spray USE 2 SPRAYS IN EACH NOSTRIL EVERY DAY  16 g  3  . Glucosamine-Chondroit-Vit C-Mn (GLUCOSAMINE CHONDR 1500 COMPLX PO) Take 1 tablet by mouth daily.      Marland Kitchen  levothyroxine (SYNTHROID, LEVOTHROID) 112 MCG tablet TAKE 1 TABLET (112 MCG TOTAL) BY MOUTH DAILY.  30 tablet  6  . metoprolol (LOPRESSOR) 100 MG tablet TAKE 1 TABLET (100 MG TOTAL) BY MOUTH 2 (TWO) TIMES DAILY.  180 tablet  3  . Multiple Vitamin (MULTIVITAMIN) capsule Take 1 capsule by mouth daily.      . nitroGLYCERIN (NITROSTAT) 0.4 MG SL tablet Place 0.4 mg under the tongue every 5 (five) minutes as needed.      . Omega-3 Fatty Acids (FISH OIL) 1200 MG CAPS Take 1 capsule by mouth daily.      Marland Kitchen omeprazole (PRILOSEC) 20 MG capsule TAKE ONE CAPSULE BY MOUTH EVERY DAY  90 capsule  3  . Potassium Gluconate 550 MG TABS Take 1 tablet by mouth 2 (two) times daily.       .  rosuvastatin (CRESTOR) 40 MG tablet TAKE 1 TABLET BY MOUTH EVERY DAY  90 tablet  1  . tiotropium (SPIRIVA) 18 MCG inhalation capsule Place 1 capsule (18 mcg total) into inhaler and inhale daily.  30 capsule  12  . UNABLE TO FIND Med Name: Sinus and Allergy relief PE Per box directions as needed       No current facility-administered medications on file prior to visit.     Past Medical History  Diagnosis Date  . Depression   . Chicken pox   . GERD (gastroesophageal reflux disease)   . Hay fever   . History of blood transfusion   . Pneumonia, organism unspecified   . Hypothyroid   . COPD (chronic obstructive pulmonary disease)   . History of syncope 2000    negative EP study  . History of hypercholesterolemia   . Arrhythmia   . Hyperlipidemia     intolerance to statins except Crestor.   . Coronary artery disease     NSTEMI in 09/2008. Cath : 80% RCA and 95% first diagonal. PCI and 2 DES placement  RCA and 1 DES to diagonal. Complicated by acute diagonal stent thrombosis . Treated by thrombectomy. Most recent nuclear stress test in 2010 showed no ischemia with normal EF.   Marland Kitchen Hypertension   . MI (myocardial infarction)      Past Surgical History  Procedure Laterality Date  . Tubal ligation    . Tonsillectomy    . Coronary angioplasty with stent placement  2009    [facility], drug-eluting mid RCA,prox diag;     Family History  Problem Relation Age of Onset  . Colon cancer Father   . Lung cancer Father     was a former smoker  . Cancer Father     lung  . Hypertension    . Diabetes    . Hypertension Mother      History   Social History  . Marital Status: Widowed    Spouse Name: N/A    Number of Children: N/A  . Years of Education: N/A   Occupational History  . Not on file.   Social History Main Topics  . Smoking status: Current Every Day Smoker -- 1.00 packs/day for 53 years    Types: Cigarettes  . Smokeless tobacco: Never Used  . Alcohol Use: 0.5  oz/week    1 drink(s) per week  . Drug Use: No  . Sexual Activity: Not on file   Other Topics Concern  . Not on file   Social History Narrative   Lives with partner in [place]. 2 cats 2 dogs in home.  Recently moved from [facility].   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      PHYSICAL EXAM   BP 132/70  Pulse 65  Ht 5\' 6"  (1.676 m)  Wt 171 lb (77.565 kg)  BMI 27.61 kg/m2 Constitutional: She is oriented to person, place, and time. She appears well-developed and well-nourished. No distress.  HENT: No nasal discharge.  Head: Normocephalic and atraumatic.  Eyes: Pupils are equal and round. Right eye exhibits no discharge. Left eye exhibits no discharge.  Neck: Normal range of motion. Neck supple. No JVD present. No thyromegaly present.  Cardiovascular: Normal rate, regular rhythm, normal heart sounds. Exam reveals no gallop and no friction rub. No murmur heard.  Pulmonary/Chest: Effort normal and breath sounds normal. No stridor. No respiratory distress. She has no wheezes. She has no rales. She exhibits no tenderness.  Abdominal: Soft. Bowel sounds are normal. She exhibits no distension. There is no tenderness. There is no rebound and no guarding.  Musculoskeletal: Normal range of motion. She exhibits no edema and no tenderness.  Neurological: She is alert and oriented to person, place, and time. Coordination normal.  Skin: Skin is warm and dry. No rash noted. She is not diaphoretic. No erythema. No pallor.  Psychiatric: She has a normal mood and affect. Her behavior is normal. Judgment and thought content normal.     EKG: Normal sinus rhythm with no significant ST or T wave changes.   ASSESSMENT AND PLAN

## 2013-08-15 NOTE — Assessment & Plan Note (Signed)
Blood pressure is reasonably controlled on current medications. 

## 2013-08-15 NOTE — Assessment & Plan Note (Signed)
Lab Results  Component Value Date   CHOL 163 01/24/2013   HDL 51.50 01/24/2013   LDLCALC 72 01/24/2013   LDLDIRECT 89.1 07/10/2012   TRIG 197.0* 01/24/2013   CHOLHDL 3 01/24/2013   Continue treatment with Crestor.

## 2013-08-19 ENCOUNTER — Encounter: Payer: Self-pay | Admitting: Internal Medicine

## 2013-08-19 ENCOUNTER — Ambulatory Visit (INDEPENDENT_AMBULATORY_CARE_PROVIDER_SITE_OTHER): Payer: Medicare Other | Admitting: Internal Medicine

## 2013-08-19 VITALS — BP 120/70 | HR 62 | Temp 98.2°F | Wt 172.0 lb

## 2013-08-19 DIAGNOSIS — I2510 Atherosclerotic heart disease of native coronary artery without angina pectoris: Secondary | ICD-10-CM

## 2013-08-19 DIAGNOSIS — M999 Biomechanical lesion, unspecified: Secondary | ICD-10-CM | POA: Diagnosis not present

## 2013-08-19 DIAGNOSIS — J449 Chronic obstructive pulmonary disease, unspecified: Secondary | ICD-10-CM | POA: Diagnosis not present

## 2013-08-19 DIAGNOSIS — F411 Generalized anxiety disorder: Secondary | ICD-10-CM

## 2013-08-19 DIAGNOSIS — Z0000 Encounter for general adult medical examination without abnormal findings: Secondary | ICD-10-CM

## 2013-08-19 DIAGNOSIS — M9981 Other biomechanical lesions of cervical region: Secondary | ICD-10-CM | POA: Diagnosis not present

## 2013-08-19 DIAGNOSIS — F419 Anxiety disorder, unspecified: Secondary | ICD-10-CM

## 2013-08-19 DIAGNOSIS — M531 Cervicobrachial syndrome: Secondary | ICD-10-CM | POA: Diagnosis not present

## 2013-08-19 DIAGNOSIS — IMO0002 Reserved for concepts with insufficient information to code with codable children: Secondary | ICD-10-CM | POA: Diagnosis not present

## 2013-08-19 NOTE — Assessment & Plan Note (Signed)
Symptoms are currently well-controlled with medication. Will continue Symbicort, Spiriva, and prn albuterol. Encouraged smoking cessation.

## 2013-08-19 NOTE — Assessment & Plan Note (Signed)
Symptoms recently worsen with increased family stressors. Discussed potentially adding medication 2 citalopram. Patient would like to hold off for now. Will continue to monitor. Followup in 3 months or sooner as needed.

## 2013-08-19 NOTE — Progress Notes (Signed)
Subjective:    Patient ID: Jodi Beasley, female    DOB: [birth year], 67 y.o.   MRN: [id number]  HPI 67 year old female with history of coronary artery disease, COPD, anxiety presents for followup. She recently went to the local ER complaining of chest pain. Evaluation, reportedly including EKG and cardiac markers was negative. She was seen by local cardiologist and is scheduled to have nuclear stress test on Thursday. She denies any recurrent episodes of chest pain. She denies any shortness of breath, palpitations, diaphoresis. She has been compliant with medications.  In regards to COPD, she reports symptoms have been well-controlled with use of Symbicort, Spiriva, and when necessary albuterol. She continues to smoke. No recent change in cough or sputum production.  She notes that anxiety has been markedly increased recently with caring for her mother. She thinks that symptoms are improved with use of citalopram. She does not wish to add any additional medication at this time.  Outpatient Encounter Prescriptions as of 08/19/2013  Medication Sig Dispense Refill  . albuterol (PROAIR HFA) 108 (90 BASE) MCG/ACT inhaler Inhale 2 puffs into the lungs every 6 (six) hours as needed.  18 g  12  . aspirin 81 MG tablet Take 81 mg by mouth daily.      Marland Kitchen azelastine (ASTELIN) 137 MCG/SPRAY nasal spray Place 1 spray into the nose 2 (two) times daily. Use in each nostril as directed  30 mL  5  . b complex vitamins tablet Take 1 tablet by mouth daily.      . budesonide-formoterol (SYMBICORT) 160-4.5 MCG/ACT inhaler Inhale 2 puffs into the lungs 2 (two) times daily.  3 Inhaler  3  . carisoprodol (SOMA) 350 MG tablet Take 1 tablet (350 mg total) by mouth 2 (two) times daily as needed for muscle spasms.  60 tablet  3  . citalopram (CELEXA) 40 MG tablet Take 1 tablet (40 mg total) by mouth daily.  30 tablet  6  . Coenzyme Q-10 400 MG CAPS Take 400 mg by mouth 2 (two) times daily.      . fexofenadine (ALLEGRA) 180 MG  tablet Take 180 mg by mouth daily.      . fluticasone (FLONASE) 50 MCG/ACT nasal spray USE 2 SPRAYS IN EACH NOSTRIL EVERY DAY  16 g  3  . Glucosamine-Chondroit-Vit C-Mn (GLUCOSAMINE CHONDR 1500 COMPLX PO) Take 1 tablet by mouth daily.      Marland Kitchen levothyroxine (SYNTHROID, LEVOTHROID) 112 MCG tablet TAKE 1 TABLET (112 MCG TOTAL) BY MOUTH DAILY.  30 tablet  6  . metoprolol (LOPRESSOR) 100 MG tablet TAKE 1 TABLET (100 MG TOTAL) BY MOUTH 2 (TWO) TIMES DAILY.  180 tablet  3  . Multiple Vitamin (MULTIVITAMIN) capsule Take 1 capsule by mouth daily.      . nitroGLYCERIN (NITROSTAT) 0.4 MG SL tablet Place 0.4 mg under the tongue every 5 (five) minutes as needed.      . Omega-3 Fatty Acids (FISH OIL) 1200 MG CAPS Take 1 capsule by mouth daily.      Marland Kitchen omeprazole (PRILOSEC) 20 MG capsule TAKE ONE CAPSULE BY MOUTH EVERY DAY  90 capsule  3  . Potassium Gluconate 550 MG TABS Take 1 tablet by mouth 2 (two) times daily.       . rosuvastatin (CRESTOR) 40 MG tablet TAKE 1 TABLET BY MOUTH EVERY DAY  90 tablet  1  . tiotropium (SPIRIVA) 18 MCG inhalation capsule Place 1 capsule (18 mcg total) into inhaler and inhale daily.  30 capsule  12  . UNABLE TO FIND Med Name: Sinus and Allergy relief PE Per box directions as needed       No facility-administered encounter medications on file as of 08/19/2013.   BP 120/70  Pulse 62  Temp(Src) 98.2 F (36.8 C) (Oral)  Wt 172 lb (78.019 kg)  BMI 27.77 kg/m2  SpO2 96%  Review of Systems  Constitutional: Negative for fever, chills, appetite change, fatigue and unexpected weight change.  HENT: Negative for ear pain, congestion, sore throat, trouble swallowing, neck pain, voice change and sinus pressure.   Eyes: Negative for visual disturbance.  Respiratory: Positive for cough. Negative for shortness of breath, wheezing and stridor.   Cardiovascular: Positive for chest pain. Negative for palpitations and leg swelling.  Gastrointestinal: Negative for nausea, vomiting, abdominal  pain, diarrhea, constipation, blood in stool, abdominal distention and anal bleeding.  Genitourinary: Negative for dysuria and flank pain.  Musculoskeletal: Negative for myalgias, arthralgias and gait problem.  Skin: Negative for color change and rash.  Neurological: Negative for dizziness and headaches.  Hematological: Negative for adenopathy. Does not bruise/bleed easily.  Psychiatric/Behavioral: Negative for suicidal ideas, sleep disturbance and dysphoric mood. The patient is nervous/anxious.        Objective:   Physical Exam  Constitutional: She is oriented to person, place, and time. She appears well-developed and well-nourished. No distress.  HENT:  Head: Normocephalic and atraumatic.  Right Ear: External ear normal.  Left Ear: External ear normal.  Nose: Nose normal.  Mouth/Throat: Oropharynx is clear and moist. No oropharyngeal exudate.  Eyes: Conjunctivae are normal. Pupils are equal, round, and reactive to light. Right eye exhibits no discharge. Left eye exhibits no discharge. No scleral icterus.  Neck: Normal range of motion. Neck supple. No tracheal deviation present. No thyromegaly present.  Cardiovascular: Normal rate, regular rhythm, normal heart sounds and intact distal pulses.  Exam reveals no gallop and no friction rub.   No murmur heard. Pulmonary/Chest: Effort normal and breath sounds normal. No accessory muscle usage. Not tachypneic. No respiratory distress. She has no decreased breath sounds. She has no wheezes. She has no rhonchi. She has no rales. She exhibits no tenderness.  Musculoskeletal: Normal range of motion. She exhibits no edema and no tenderness.  Lymphadenopathy:    She has no cervical adenopathy.  Neurological: She is alert and oriented to person, place, and time. No cranial nerve deficit. She exhibits normal muscle tone. Coordination normal.  Skin: Skin is warm and dry. No rash noted. She is not diaphoretic. No erythema. No pallor.  Psychiatric: She  has a normal mood and affect. Her behavior is normal. Judgment and thought content normal.          Assessment & Plan:

## 2013-08-19 NOTE — Assessment & Plan Note (Signed)
Recent episode of chest pain resulting in ER visit. ER workup including EKG and cardiac markers were reportedly normal. Cardiology evaluation in progress and patient is scheduled for nuclear stress test this week. Will follow.

## 2013-08-21 DIAGNOSIS — M531 Cervicobrachial syndrome: Secondary | ICD-10-CM | POA: Diagnosis not present

## 2013-08-21 DIAGNOSIS — M999 Biomechanical lesion, unspecified: Secondary | ICD-10-CM | POA: Diagnosis not present

## 2013-08-21 DIAGNOSIS — IMO0002 Reserved for concepts with insufficient information to code with codable children: Secondary | ICD-10-CM | POA: Diagnosis not present

## 2013-08-21 DIAGNOSIS — M9981 Other biomechanical lesions of cervical region: Secondary | ICD-10-CM | POA: Diagnosis not present

## 2013-08-22 ENCOUNTER — Other Ambulatory Visit: Payer: Self-pay | Admitting: Internal Medicine

## 2013-08-22 ENCOUNTER — Ambulatory Visit: Payer: Self-pay | Admitting: Cardiovascular Disease

## 2013-08-22 ENCOUNTER — Other Ambulatory Visit: Payer: Self-pay

## 2013-08-22 DIAGNOSIS — I2510 Atherosclerotic heart disease of native coronary artery without angina pectoris: Secondary | ICD-10-CM

## 2013-08-22 DIAGNOSIS — R079 Chest pain, unspecified: Secondary | ICD-10-CM | POA: Diagnosis not present

## 2013-08-23 ENCOUNTER — Telehealth: Payer: Self-pay

## 2013-08-23 ENCOUNTER — Institutional Professional Consult (permissible substitution): Payer: Medicare Other | Admitting: Internal Medicine

## 2013-08-23 NOTE — Telephone Encounter (Signed)
Message copied by Marilynne Halsted on Fri Aug 23, 2013  4:43 PM ------      Message from: [place]ine [place] A      Created: Fri Aug 23, 2013 11:04 AM       Inform patient that  stress test was normal. She should let us know if she continues to have chest pain. Otherwise, follow up in 6 months as planned. ------

## 2013-08-26 ENCOUNTER — Telehealth: Payer: Self-pay

## 2013-08-26 NOTE — Telephone Encounter (Signed)
Message copied by Marilynne Halsted on Mon Aug 26, 2013 10:16 AM ------      Message from: [place], [place] A      Created: Fri Aug 23, 2013 11:04 AM       Inform patient that  stress test was normal. She should let us know if she continues to have chest pain. Otherwise, follow up in 6 months as planned. ------

## 2013-08-26 NOTE — Telephone Encounter (Signed)
Spoke w/ pt.  She is aware of results. 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

## 2013-08-27 ENCOUNTER — Other Ambulatory Visit: Payer: Self-pay | Admitting: Internal Medicine

## 2013-08-27 DIAGNOSIS — IMO0002 Reserved for concepts with insufficient information to code with codable children: Secondary | ICD-10-CM | POA: Diagnosis not present

## 2013-08-27 DIAGNOSIS — M9981 Other biomechanical lesions of cervical region: Secondary | ICD-10-CM | POA: Diagnosis not present

## 2013-08-27 DIAGNOSIS — M531 Cervicobrachial syndrome: Secondary | ICD-10-CM | POA: Diagnosis not present

## 2013-08-27 DIAGNOSIS — M999 Biomechanical lesion, unspecified: Secondary | ICD-10-CM | POA: Diagnosis not present

## 2013-09-05 DIAGNOSIS — J209 Acute bronchitis, unspecified: Secondary | ICD-10-CM | POA: Diagnosis not present

## 2013-09-09 DIAGNOSIS — IMO0002 Reserved for concepts with insufficient information to code with codable children: Secondary | ICD-10-CM | POA: Diagnosis not present

## 2013-09-09 DIAGNOSIS — M999 Biomechanical lesion, unspecified: Secondary | ICD-10-CM | POA: Diagnosis not present

## 2013-09-09 DIAGNOSIS — M9981 Other biomechanical lesions of cervical region: Secondary | ICD-10-CM | POA: Diagnosis not present

## 2013-09-09 DIAGNOSIS — M531 Cervicobrachial syndrome: Secondary | ICD-10-CM | POA: Diagnosis not present

## 2013-09-17 ENCOUNTER — Ambulatory Visit: Payer: Medicare Other | Admitting: Pulmonary Disease

## 2013-09-20 ENCOUNTER — Ambulatory Visit: Payer: Medicare Other | Admitting: Cardiovascular Disease

## 2013-09-24 ENCOUNTER — Other Ambulatory Visit: Payer: Self-pay | Admitting: Adult Health

## 2013-09-24 NOTE — Telephone Encounter (Signed)
Refill

## 2013-10-08 DIAGNOSIS — M999 Biomechanical lesion, unspecified: Secondary | ICD-10-CM | POA: Diagnosis not present

## 2013-10-08 DIAGNOSIS — IMO0002 Reserved for concepts with insufficient information to code with codable children: Secondary | ICD-10-CM | POA: Diagnosis not present

## 2013-10-08 DIAGNOSIS — M9981 Other biomechanical lesions of cervical region: Secondary | ICD-10-CM | POA: Diagnosis not present

## 2013-10-08 DIAGNOSIS — M531 Cervicobrachial syndrome: Secondary | ICD-10-CM | POA: Diagnosis not present

## 2013-10-10 ENCOUNTER — Other Ambulatory Visit: Payer: Self-pay

## 2013-10-17 DIAGNOSIS — Z23 Encounter for immunization: Secondary | ICD-10-CM | POA: Diagnosis not present

## 2013-10-21 ENCOUNTER — Other Ambulatory Visit: Payer: Self-pay | Admitting: Internal Medicine

## 2013-10-28 ENCOUNTER — Other Ambulatory Visit: Payer: Self-pay | Admitting: Internal Medicine

## 2013-11-04 DIAGNOSIS — M999 Biomechanical lesion, unspecified: Secondary | ICD-10-CM | POA: Diagnosis not present

## 2013-11-04 DIAGNOSIS — IMO0002 Reserved for concepts with insufficient information to code with codable children: Secondary | ICD-10-CM | POA: Diagnosis not present

## 2013-11-04 DIAGNOSIS — M531 Cervicobrachial syndrome: Secondary | ICD-10-CM | POA: Diagnosis not present

## 2013-11-04 DIAGNOSIS — M9981 Other biomechanical lesions of cervical region: Secondary | ICD-10-CM | POA: Diagnosis not present

## 2013-11-11 ENCOUNTER — Ambulatory Visit (INDEPENDENT_AMBULATORY_CARE_PROVIDER_SITE_OTHER): Payer: Medicare Other | Admitting: Cardiovascular Disease

## 2013-11-11 ENCOUNTER — Encounter: Payer: Self-pay | Admitting: Cardiovascular Disease

## 2013-11-11 VITALS — BP 136/72 | HR 64 | Resp 16 | Ht 66.0 in | Wt 168.3 lb

## 2013-11-11 DIAGNOSIS — I10 Essential (primary) hypertension: Secondary | ICD-10-CM | POA: Diagnosis not present

## 2013-11-11 DIAGNOSIS — E785 Hyperlipidemia, unspecified: Secondary | ICD-10-CM | POA: Diagnosis not present

## 2013-11-11 DIAGNOSIS — F17200 Nicotine dependence, unspecified, uncomplicated: Secondary | ICD-10-CM

## 2013-11-11 DIAGNOSIS — I2510 Atherosclerotic heart disease of native coronary artery without angina pectoris: Secondary | ICD-10-CM

## 2013-11-11 DIAGNOSIS — Z720 Tobacco use: Secondary | ICD-10-CM

## 2013-11-11 MED ORDER — NITROGLYCERIN 0.4 MG SL SUBL: 0.4000 mg | SUBLINGUAL_TABLET | SUBLINGUAL | Status: DC | PRN

## 2013-11-11 NOTE — Patient Instructions (Signed)
Your physician recommends that you schedule a follow-up appointment in: 6 months Your physician discussed the hazards of tobacco use. Tobacco use cessation is recommended and techniques and options to help you quit were discussed.     

## 2013-11-12 ENCOUNTER — Other Ambulatory Visit: Payer: Medicare Other

## 2013-11-17 ENCOUNTER — Encounter: Payer: Self-pay | Admitting: Cardiovascular Disease

## 2013-11-17 NOTE — Assessment & Plan Note (Signed)
Satisfactory blood pressure control. Beta blockers are preferred agents for blood pressure management in view of her history of CAD or

## 2013-11-17 NOTE — Assessment & Plan Note (Addendum)
Despite her previous failures to quit, this has to remain a very important long-term goal for her. We talked about the fact that the upcoming holidays might actually be a good opportunity for her to make a New Year resolution.

## 2013-11-17 NOTE — Assessment & Plan Note (Signed)
Has been 5 years and she received 2 drug-eluting stents and she remains asymptomatic. She appears to have good functional status, although there is activity limitation secondary to low back pain. The episode of chest discomfort that occurred in September was fairly atypical. It was followed by evaluation with a normal nuclear stress test. At this point invasive angiography does not appear to be justified.

## 2013-11-17 NOTE — Assessment & Plan Note (Signed)
Total cholesterol and LDL cholesterol are in the desirable range, but she has elevated triglycerides. Talked about restricting the intake of simplesugars and starches with high glycemic index. Weight loss is desirable. She asked about the side effects of statins. We talked about the different risk benefit ratio in patients with established coronary disease versus patients who are simply seeking primary prevention. In her case I think the benefit of a statin is indisputable.

## 2013-11-17 NOTE — Progress Notes (Signed)
Patient ID: Jodi Beasley, female   DOB: Apr 17, 1946, 67 y.o.   MRN: [id number]      Reason for office visit CAD  Jodi Beasley is here to establish new cardiology followup. She has a remote history of coronary problems previously received her care in [place]. She had a small non-ST segment elevation myocardial infarction and October of 2009 and underwent placement of 2 drug-eluting stents: to the right coronary artery into the diagonal artery respectively. Immediately following this procedure she had lateral ST segment elevation secondary to acute diagonal stent thrombosis. This was treated with thrombectomy and balloon angioplasty. In September of this year she had chest pain for the first time in a very long time. The chest pain was sharp but did respond to nitroglycerin and resolved by the time she reached the emergency room. She immediately states that she thinks this is secondary to family stress, caring for her elderly and frail mother. A followup nuclear vasodilator stress test was normal. She has normal left ventricular systolic function.  She takes a statin, beta blocker and aspirin. She has sciatica which limits her ability to exercise.  She has clear-cut symptoms and physical findings of emphysema and sees Dr. Max Fickle. Her FEV1 was roughly 75% of predicted. She has smoked roughly 1/2 pack of cigarettes a day for over 50 years. She tells me that she has tried to quit smoking many many times. Once she was able to quit for a whole year.   Allergies  Allergen Reactions  . Benzocaine     Tongue swelling  . Darvon [Propoxyphene Hcl]     HA  . Neosporin [Neomycin-Bacitracin Zn-Polymyx]     blisters  . Nicoderm [Nicotine]     arrythmia    Current Outpatient Prescriptions  Medication Sig Dispense Refill  . albuterol (PROAIR HFA) 108 (90 BASE) MCG/ACT inhaler Inhale 2 puffs into the lungs every 6 (six) hours as needed.  18 g  12  . aspirin 81 MG tablet Take 81 mg by mouth daily.       Marland Kitchen azelastine (ASTELIN) 137 MCG/SPRAY nasal spray Place 1 spray into the nose 2 (two) times daily. Use in each nostril as directed  30 mL  5  . b complex vitamins tablet Take 1 tablet by mouth daily.      . budesonide-formoterol (SYMBICORT) 160-4.5 MCG/ACT inhaler Inhale 2 puffs into the lungs 2 (two) times daily.  3 Inhaler  3  . carisoprodol (SOMA) 350 MG tablet TAKE 1 TABLET BY MOUTH TWICE A DAY AS NEEDED FOR MUSCLE SPASM  60 tablet  0  . citalopram (CELEXA) 40 MG tablet Take 1 tablet (40 mg total) by mouth daily.  30 tablet  6  . Coenzyme Q-10 400 MG CAPS Take 400 mg by mouth 2 (two) times daily.      . CRESTOR 40 MG tablet TAKE 1 TABLET BY MOUTH EVERY DAY  30 tablet  5  . fexofenadine (ALLEGRA) 180 MG tablet Take 180 mg by mouth daily.      . fluticasone (FLONASE) 50 MCG/ACT nasal spray USE 2 SPRAYS IN EACH NOSTRIL EVERY DAY  16 g  3  . Glucosamine-Chondroit-Vit C-Mn (GLUCOSAMINE CHONDR 1500 COMPLX PO) Take 1 tablet by mouth daily.      Marland Kitchen levothyroxine (SYNTHROID, LEVOTHROID) 112 MCG tablet TAKE 1 TABLET (112 MCG TOTAL) BY MOUTH DAILY.  30 tablet  3  . metoprolol (LOPRESSOR) 100 MG tablet TAKE 1 TABLET (100 MG TOTAL) BY MOUTH 2 (TWO) TIMES DAILY.  180 tablet  3  . Multiple Vitamin (MULTIVITAMIN) capsule Take 1 capsule by mouth daily.      . nitroGLYCERIN (NITROSTAT) 0.4 MG SL tablet Place 1 tablet (0.4 mg total) under the tongue every 5 (five) minutes as needed.  25 tablet  3  . Omega-3 Fatty Acids (FISH OIL) 1200 MG CAPS Take 1 capsule by mouth daily.      Marland Kitchen omeprazole (PRILOSEC) 20 MG capsule TAKE ONE CAPSULE BY MOUTH EVERY DAY  90 capsule  3  . Potassium Gluconate 550 MG TABS Take 1 tablet by mouth 2 (two) times daily.       . rosuvastatin (CRESTOR) 40 MG tablet TAKE 1 TABLET BY MOUTH EVERY DAY  90 tablet  1  . tiotropium (SPIRIVA) 18 MCG inhalation capsule Place 1 capsule (18 mcg total) into inhaler and inhale daily.  30 capsule  12  . UNABLE TO FIND Med Name: Sinus and Allergy relief  PE Per box directions as needed       No current facility-administered medications for this visit.    Past Medical History  Diagnosis Date  . Depression   . Chicken pox   . GERD (gastroesophageal reflux disease)   . Hay fever   . History of blood transfusion   . Pneumonia, organism unspecified   . Hypothyroid   . COPD (chronic obstructive pulmonary disease)   . History of syncope 2000    negative EP study  . History of hypercholesterolemia   . Arrhythmia   . Hyperlipidemia     intolerance to statins except Crestor.   . Coronary artery disease     NSTEMI in 09/2008. Cath : 80% RCA and 95% first diagonal. PCI and 2 DES placement  RCA and 1 DES to diagonal. Complicated by acute diagonal stent thrombosis . Treated by thrombectomy. Most recent nuclear stress test in 2010 showed no ischemia with normal EF.   Marland Kitchen Hypertension   . MI (myocardial infarction)     Past Surgical History  Procedure Laterality Date  . Tubal ligation    . Tonsillectomy    . Coronary angioplasty with stent placement  2009    [facility], drug-eluting mid RCA,prox diag;    Family History  Problem Relation Age of Onset  . Colon cancer Father   . Lung cancer Father     was a former smoker  . Cancer Father     lung  . Hypertension    . Diabetes    . Hypertension Mother     History   Social History  . Marital Status: Widowed    Spouse Name: N/A    Number of Children: N/A  . Years of Education: N/A   Occupational History  . Not on file.   Social History Main Topics  . Smoking status: Current Every Day Smoker -- 1.00 packs/day for 53 years    Types: Cigarettes  . Smokeless tobacco: Never Used  . Alcohol Use: 0.5 oz/week    1 drink(s) per week  . Drug Use: No  . Sexual Activity: Not on file   Other Topics Concern  . Not on file   Social History Narrative   Lives with partner in [place]. 2 cats 2 dogs in home.   Recently moved from [facility].   Custody of 42month old.    Review of  systems: The patient specifically denies any chest pain at rest or with exertion, dyspnea at rest or with exertion, orthopnea, paroxysmal nocturnal dyspnea, syncope, palpitations, focal  neurological deficits, intermittent claudication, lower extremity edema, unexplained weight gain, cough, hemoptysis or wheezing.  The patient also denies abdominal pain, nausea, vomiting, dysphagia, diarrhea, constipation, polyuria, polydipsia, dysuria, hematuria, frequency, urgency, abnormal bleeding or bruising, fever, chills, unexpected weight changes, mood swings, change in skin or hair texture, change in voice quality, auditory or visual problems, allergic reactions or rashes, new musculoskeletal complaints other than usual "aches and pains".   PHYSICAL EXAM BP 136/72  Pulse 64  Resp 16  Ht 5\' 6"  (1.676 m)  Wt 168 lb 4.8 oz (76.34 kg)  BMI 27.18 kg/m2  General: Alert, oriented x3, no distress Head: no evidence of trauma, PERRL, EOMI, no exophtalmos or lid lag, no myxedema, no xanthelasma; normal ears, nose and oropharynx Neck: normal jugular venous pulsations and no hepatojugular reflux; brisk carotid pulses without delay and no carotid bruits Chest: no signs of consolidation by percussion or palpation, normal fremitus, symmetrical and full respiratory excursions. Breath sounds are distant. She has a little bit of upper airway stridor and some very faint expiratory wheezes Cardiovascular: normal position and quality of the apical impulse, regular rhythm, normal first and second heart sounds, no murmurs, rubs or gallops Abdomen: no tenderness or distention, no masses by palpation, no abnormal pulsatility or arterial bruits, normal bowel sounds, no hepatosplenomegaly Extremities: no clubbing, cyanosis or edema; 2+ radial, ulnar and brachial pulses bilaterally; 2+ right femoral, posterior tibial and dorsalis pedis pulses; 2+ left femoral, posterior tibial and dorsalis pedis pulses; no subclavian or femoral  bruits Neurological: grossly nonfocal   EKG:  Normal tracing, normal sinus rhythm  Lipid Panel     Component Value Date/Time   CHOL 163 01/24/2013 1016   TRIG 197.0* 01/24/2013 1016   HDL 51.50 01/24/2013 1016   CHOLHDL 3 01/24/2013 1016   VLDL 39.4 01/24/2013 1016   LDLCALC 72 01/24/2013 1016    BMET    Component Value Date/Time   NA 135 01/24/2013 1016   K 4.3 01/24/2013 1016   CL 101 01/24/2013 1016   CO2 27 01/24/2013 1016   GLUCOSE 98 01/24/2013 1016   BUN 10 01/24/2013 1016   CREATININE 0.9 01/24/2013 1016   CALCIUM 9.6 01/24/2013 1016     ASSESSMENT AND PLAN Coronary artery disease Has been 5 years and she received 2 drug-eluting stents and she remains asymptomatic. She appears to have good functional status, although there is activity limitation secondary to low back pain. The episode of chest discomfort that occurred in September was fairly atypical. It was followed by evaluation with a normal nuclear stress test. At this point invasive angiography does not appear to be justified.  Hypertension Satisfactory blood pressure control. Beta blockers are preferred agents for blood pressure management in view of her history of CAD or  Other and unspecified hyperlipidemia Total cholesterol and LDL cholesterol are in the desirable range, but she has elevated triglycerides. Talked about restricting the intake of simplesugars and starches with high glycemic index. Weight loss is desirable. She asked about the side effects of statins. We talked about the different risk benefit ratio in patients with established coronary disease versus patients who are simply seeking primary prevention. In her case I think the benefit of a statin is indisputable.   Tobacco abuse Despite her previous failures to quit, this has to remain a very important long-term goal for her. We talked about the fact that the upcoming holidays might actually be a good opportunity for her to make a New Year  resolution.  Patient Instructions  Your physician recommends that you schedule a follow-up appointment in: 6 months Your physician discussed the hazards of tobacco use. Tobacco use cessation is recommended and techniques and options to help you quit were discussed.       Meds ordered this encounter  Medications  . nitroGLYCERIN (NITROSTAT) 0.4 MG SL tablet    Sig: Place 1 tablet (0.4 mg total) under the tongue every 5 (five) minutes as needed.    Dispense:  25 tablet    Refill:  3    Ivis Henneman  Thurmon Fair, MD, [facility] [phone] office [phone] pager

## 2013-11-19 ENCOUNTER — Encounter: Payer: Self-pay | Admitting: Internal Medicine

## 2013-11-19 ENCOUNTER — Ambulatory Visit (INDEPENDENT_AMBULATORY_CARE_PROVIDER_SITE_OTHER)
Admission: RE | Admit: 2013-11-19 | Discharge: 2013-11-19 | Disposition: A | Discharge status: Home / Self Care | Payer: Medicare Other | Source: Ambulatory Visit | Attending: Pulmonary Disease | Admitting: Pulmonary Disease

## 2013-11-19 ENCOUNTER — Ambulatory Visit (INDEPENDENT_AMBULATORY_CARE_PROVIDER_SITE_OTHER): Payer: Medicare Other | Admitting: Internal Medicine

## 2013-11-19 ENCOUNTER — Ambulatory Visit (INDEPENDENT_AMBULATORY_CARE_PROVIDER_SITE_OTHER): Payer: Medicare Other | Admitting: Pulmonary Disease

## 2013-11-19 ENCOUNTER — Encounter: Payer: Self-pay | Admitting: Pulmonary Disease

## 2013-11-19 VITALS — BP 130/74 | HR 69 | Ht 64.0 in | Wt 170.0 lb

## 2013-11-19 VITALS — BP 122/80 | HR 62 | Temp 98.0°F | Ht 63.75 in | Wt 169.0 lb

## 2013-11-19 DIAGNOSIS — R059 Cough, unspecified: Secondary | ICD-10-CM | POA: Diagnosis not present

## 2013-11-19 DIAGNOSIS — F4323 Adjustment disorder with mixed anxiety and depressed mood: Secondary | ICD-10-CM

## 2013-11-19 DIAGNOSIS — Z23 Encounter for immunization: Secondary | ICD-10-CM | POA: Diagnosis not present

## 2013-11-19 DIAGNOSIS — F17200 Nicotine dependence, unspecified, uncomplicated: Secondary | ICD-10-CM

## 2013-11-19 DIAGNOSIS — Z1239 Encounter for other screening for malignant neoplasm of breast: Secondary | ICD-10-CM

## 2013-11-19 DIAGNOSIS — J449 Chronic obstructive pulmonary disease, unspecified: Secondary | ICD-10-CM

## 2013-11-19 DIAGNOSIS — R05 Cough: Secondary | ICD-10-CM

## 2013-11-19 DIAGNOSIS — J209 Acute bronchitis, unspecified: Secondary | ICD-10-CM

## 2013-11-19 DIAGNOSIS — Z79899 Other long term (current) drug therapy: Secondary | ICD-10-CM | POA: Insufficient documentation

## 2013-11-19 DIAGNOSIS — Z720 Tobacco use: Secondary | ICD-10-CM

## 2013-11-19 DIAGNOSIS — Z0000 Encounter for general adult medical examination without abnormal findings: Secondary | ICD-10-CM

## 2013-11-19 DIAGNOSIS — F411 Generalized anxiety disorder: Secondary | ICD-10-CM | POA: Diagnosis not present

## 2013-11-19 DIAGNOSIS — F419 Anxiety disorder, unspecified: Secondary | ICD-10-CM

## 2013-11-19 HISTORY — DX: Adjustment disorder with mixed anxiety and depressed mood: F43.23

## 2013-11-19 MED ORDER — AMOXICILLIN-POT CLAVULANATE 875-125 MG PO TABS: 1.0000 | ORAL_TABLET | Freq: Two times a day (BID) | ORAL | Status: AC

## 2013-11-19 MED ORDER — BUPROPION HCL ER (XL) 150 MG PO TB24: 150.0000 mg | ORAL_TABLET | Freq: Every day | ORAL | Status: DC

## 2013-11-19 MED ORDER — CITALOPRAM HYDROBROMIDE 40 MG PO TABS: 40.0000 mg | ORAL_TABLET | Freq: Every day | ORAL | Status: DC

## 2013-11-19 MED ORDER — CARISOPRODOL 350 MG PO TABS: 350.0000 mg | ORAL_TABLET | Freq: Two times a day (BID) | ORAL | Status: DC | PRN

## 2013-11-19 NOTE — Progress Notes (Signed)
Pre-visit discussion using our clinic review tool. No additional management support is needed unless otherwise documented below in the visit note.  

## 2013-11-19 NOTE — Patient Instructions (Addendum)
QUIT SMOKING! We will send you for a Chest X-ray because of the cough Take the antibiotic for 5 days with yogurt Keep using your medicines We will see you back in 6 months or sooner if needed

## 2013-11-19 NOTE — Assessment & Plan Note (Signed)
Encouraged smoking cessation 

## 2013-11-19 NOTE — Progress Notes (Signed)
Subjective:    Patient ID: Jodi Beasley, female    DOB: 1946-09-17, 67 y.o.   MRN: [id number]  Synopsis: Jodi Beasley established care with the [facility] pulmonary clinic in May 2013 for COPD. She simple spirometry at that time with a ratio of 71%, the flow volume loop consistent with obstruction and an FEV1 of 1.92 L or 77% predicted. She was followed previously by Dr. Alphonzo Grieve, a pulmonologist in the [place], [place] area. She had been treated for pneumonia 3 weeks prior to her initial visit.  She has smoked one half pack of cigarettes per day for 53 years and continues to smoke.  HPI    7/8 ROV -- Jodi Beasley returns to clinic today for evaluation of her COPD. She states that she had an episode of bronchitis in May which improved with treatment with antibiotics. She do not now how to get a hold of [place] so she ended up going to urgent care. Since then she states her symptoms have improved dramatically and she is not having to use her rescue inhaler much. She is using Astelin nasal spray more often than the Nasonex and she says it worked quite well. She really only has her breath in the heat and humidity. She continues to smoke and is frustrated with her inability to quit. She states that nicotine replacement is helped most of the past.   10/16/2012 ROV --Jodi Beasley returns to clinic today for followup on her COPD. She says that she did not like to [place] and that she prefers to take the Symbicort. She currently uses 1 puff in the morning and a second later in the evening. She gets relief of shortness of breath with this. She usually has to use one dose of albuterol before going to bed. When the ragweed was out she was having to use albuterol more often. She has noted an increase in chest congestion in the last few days. She denies fevers, chills, or shortness of breath.  She continues to smoke cigarettes. She is smoking 6-10 cigarettes a day. After the last visit she did not call us to tell  us that the nicotine inhaler we gave her was not to her liking.   She has not had flares of bronchitis since the last visit.  01/21/2013 ROV -- Since our last visit Jodi Beasley has been struggling with mucus production both from her nose and lungs. She states that she has had multiple family members that have been sick and she has been in and out of hospitals. She has suffered from sinus congestion, runny nose, phlegm in her throat, and sputum production. This is been associated with a mild increase in her baseline dyspnea and wheezing. She has only been taking over-the-counter Mucinex and dextromethorphan. She also notes watery eyes and itchy eyes associated with the symptoms. She is not using saline rinses. She is using Flonase and Astelin. She has stopped taking Allegra tablets because she says "they don't work". She denies fevers chills or chest pain. Several weeks ago she was treated with doxycycline but she states that this did not help.  02/19/2013 ROV -- Jodi Beasley is doing much better since her last visit. Her sinus congestion has improved significantly and she now feels that she is able to clear some of the chest congestion. Her shortness of breath is gradually improving but she still feels short of breath with chasing after her grandchildren and running a vacuum cleaner. She continues to smoke several cigarettes per day. She purchased a  Nicotrol inhaler but she has not started using it yet.  05/21/2013 ROV -- Jodi Beasley has been doing fairly well since the last visit. She still has some shortness of breath when she is out in the heat but when she is in her home or in a store with chronic control she does quite well. She is frustrated by the fact that she still has clear to yellow sputum production all day. She states that this is sometimes associated with nasal congestion and a runny nose but not every time. She does state that when she is exposed to [facility] she has increasing itchy eyes, scratchy throat. She  continues to take multiple medications for allergies including antihistamines, nasal rinses, nasal steroids, and nasal antihistamines. She is still smoking.   11/19/2013 ROV >> Jodi Beasley is doing OK.  She is ready for the holidays.  She says that she had bronchitis a few months ago and she hasn't been able to clear congestion in her chest since.  She has white to green sputum.  It has improved, but not by much.  Still coughing.  She went to urgent care and was treated with an antibiotic, but she doesn't recall the name.  She has some dyspnae with activity around the house.  She has been compliant with the inhalers.  She is still smoking, currently about a pack a day.  She hasn't tried using the nicotrol inhaler yet.  She feels too stressed to try to quit.     Past Medical History  Diagnosis Date  . Depression   . Chicken pox   . GERD (gastroesophageal reflux disease)   . Hay fever   . History of blood transfusion   . Pneumonia, organism unspecified   . Hypothyroid   . COPD (chronic obstructive pulmonary disease)   . History of syncope 2000    negative EP study  . History of hypercholesterolemia   . Arrhythmia   . Hyperlipidemia     intolerance to statins except Crestor.   . Coronary artery disease     NSTEMI in 09/2008. Cath : 80% RCA and 95% first diagonal. PCI and 2 DES placement  RCA and 1 DES to diagonal. Complicated by acute diagonal stent thrombosis . Treated by thrombectomy. Most recent nuclear stress test in 2010 showed no ischemia with normal EF.   Marland Kitchen Hypertension   . MI (myocardial infarction)     Review of Systems  Constitutional: Negative for fever, fatigue and unexpected weight change.  HENT: Negative for congestion, postnasal drip, rhinorrhea and sinus pressure.   Respiratory: Positive for cough and shortness of breath. Negative for choking.   Cardiovascular: Negative for chest pain and leg swelling.       Objective:   Physical Exam  Filed Vitals:   11/19/13 1328   BP: 130/74  Pulse: 69  Height: 5\' 4"  (1.626 m)  Weight: 170 lb (77.111 kg)  SpO2: 94%   room air  Gen: well appearing, no acute distress HEENT: NCAT, EOMi, OP clear,  PULM: Few crackles right base, minimal wheezing, good air movement CV: RRR, no mgr, no JVD AB: BS+, soft, nontender, no hsm Ext: warm, no edema, no clubbing, no cyanosis   04/09/2012 Simple Spirometry: Ratio 71%, FEV1 1.92L (77% pred); flow volume loop consistent with obstruction     Assessment & Plan:   COPD (chronic obstructive pulmonary disease) with chronic bronchitis Jodi Beasley continues to have bronchitis despite recent antibiotics. Because of the amount and color of the mucus am going to  go ahead and treat her again with some antibiotics. This is not going to stop until she quit smoking. I had a lengthy and very direct conversation with her about the negative effects of ongoing tobacco use. She is still not willing to quit.  Plan: -Quit smoking -Augmentin 5 days with GERD -Spiriva -Symbicort -Followup 6 months  Acute bronchitis Her cough and mucus production are most likely bronchitis, but given the duration we need to check a chest x-ray to make sure there's nothing else going on.  Tobacco abuse We discussed this at length today. She is still not willing to quit. I offered Chantix and nicotine replacement.    Updated Medication List Outpatient Encounter Prescriptions as of 11/19/2013  Medication Sig  . albuterol (PROAIR HFA) 108 (90 BASE) MCG/ACT inhaler Inhale 2 puffs into the lungs every 6 (six) hours as needed.  Marland Kitchen aspirin 81 MG tablet Take 81 mg by mouth daily.  Marland Kitchen azelastine (ASTELIN) 137 MCG/SPRAY nasal spray Place 1 spray into the nose 2 (two) times daily. Use in each nostril as directed  . b complex vitamins tablet Take 1 tablet by mouth daily.  . budesonide-formoterol (SYMBICORT) 160-4.5 MCG/ACT inhaler Inhale 2 puffs into the lungs 2 (two) times daily.  Marland Kitchen buPROPion (WELLBUTRIN XL) 150 MG 24 hr  tablet Take 1 tablet (150 mg total) by mouth daily.  . carisoprodol (SOMA) 350 MG tablet Take 1 tablet (350 mg total) by mouth 2 (two) times daily as needed for muscle spasms.  . citalopram (CELEXA) 40 MG tablet Take 1 tablet (40 mg total) by mouth daily.  . CRESTOR 40 MG tablet TAKE 1 TABLET BY MOUTH EVERY DAY  . fexofenadine (ALLEGRA) 180 MG tablet Take 180 mg by mouth daily.  . fluticasone (FLONASE) 50 MCG/ACT nasal spray USE 2 SPRAYS IN EACH NOSTRIL EVERY DAY  . levothyroxine (SYNTHROID, LEVOTHROID) 112 MCG tablet TAKE 1 TABLET (112 MCG TOTAL) BY MOUTH DAILY.  . metoprolol (LOPRESSOR) 100 MG tablet TAKE 1 TABLET (100 MG TOTAL) BY MOUTH 2 (TWO) TIMES DAILY.  . Multiple Vitamin (MULTIVITAMIN) capsule Take 1 capsule by mouth daily.  . nitroGLYCERIN (NITROSTAT) 0.4 MG SL tablet Place 1 tablet (0.4 mg total) under the tongue every 5 (five) minutes as needed.  . Omega-3 Fatty Acids (FISH OIL) 1200 MG CAPS Take 1 capsule by mouth daily.  Marland Kitchen omeprazole (PRILOSEC) 20 MG capsule TAKE ONE CAPSULE BY MOUTH EVERY DAY  . Potassium Gluconate 550 MG TABS Take 1 tablet by mouth 2 (two) times daily.   Marland Kitchen tiotropium (SPIRIVA) 18 MCG inhalation capsule Place 1 capsule (18 mcg total) into inhaler and inhale daily.  Marland Kitchen UNABLE TO FIND Med Name: Sinus and Allergy relief PE Per box directions as needed

## 2013-11-19 NOTE — Assessment & Plan Note (Signed)
Recent increase in symptoms of anxiety and depressed mood related to financial and family issues. Offered support today. Will add wellbutrin 150mg  daily. Discussed risk of serotonin syndrome with Citalopram and wellbutrin. Discussed other options for treatment such as Pristique, however pt unable to afford. Pt will email or call with follow up in 2-4 weeks.

## 2013-11-19 NOTE — Progress Notes (Signed)
Subjective:    Patient ID: Jodi Beasley, female    DOB: 07/16/1946, 67 y.o.   MRN: [id number]  HPI The patient is here for annual Medicare wellness examination and management of other chronic and acute problems.   The risk factors are reflected in the social history.  The roster of all physicians providing medical care to patient - is listed in the Snapshot section of the chart.  Activities of daily living:  The patient is 100% independent in all ADLs: dressing, toileting, feeding as well as independent mobility  Home safety : The patient has smoke detectors in the home. They wear seatbelts.  There are no firearms at home. There is no violence in the home.   There is no risks for hepatitis, STDs or HIV. There is history of blood transfusion during cardiac stent placement. They have no travel history to infectious disease endemic areas of the world.  The patient has seen their dentist in the last six month. (Dentist - Dr. Smitty Cords) They have seen their eye doctor in the last year. (Opthalmologist - Dr. Dorcas Mcmurray) They admit to slight hearing difficulty with regard to crowded rooms. They have deferred audiologic testing in the last year.  They do not  have excessive sun exposure. Discussed the need for sun protection: hats, long sleeves and use of sunscreen if there is significant sun exposure. (Dermatolgist - Dr. Wende Neighbors) Cardiologist - Dr. Jack Quarto in [place], [facility]  Diet: the importance of a healthy diet is discussed. They do have a healthy diet.  The benefits of regular aerobic exercise were discussed. She walks several times per week.  Depression screen: there are no signs or vegative symptoms of depression- irritability, change in appetite, anhedonia, sadness/tearfullness. Pt notes recent increased stressors, including financial stressors, family issues with drug use, etc.  Cognitive assessment: the patient manages all their financial and personal affairs  and is actively engaged. They could relate day,date,year and events.   HCPOA - sister, Jannette Spanner, and Yvonna Alanis Living Will in Place  The following portions of the patient's history were reviewed and updated as appropriate: allergies, current medications, past family history, past medical history,  past surgical history, past social history  and problem list.  Visual acuity was not assessed per patient preference since she has regular follow up with her ophthalmologist. Hearing and body mass index were assessed and reviewed.   During the course of the visit the patient was educated and counseled about appropriate screening and preventive services including : fall prevention , diabetes screening, nutrition counseling, colorectal cancer screening, and recommended immunizations.     Outpatient Encounter Prescriptions as of 11/19/2013  Medication Sig  . albuterol (PROAIR HFA) 108 (90 BASE) MCG/ACT inhaler Inhale 2 puffs into the lungs every 6 (six) hours as needed.  Marland Kitchen aspirin 81 MG tablet Take 81 mg by mouth daily.  Marland Kitchen azelastine (ASTELIN) 137 MCG/SPRAY nasal spray Place 1 spray into the nose 2 (two) times daily. Use in each nostril as directed  . b complex vitamins tablet Take 1 tablet by mouth daily.  . budesonide-formoterol (SYMBICORT) 160-4.5 MCG/ACT inhaler Inhale 2 puffs into the lungs 2 (two) times daily.  . carisoprodol (SOMA) 350 MG tablet Take 1 tablet (350 mg total) by mouth 2 (two) times daily as needed for muscle spasms.  . citalopram (CELEXA) 40 MG tablet Take 1 tablet (40 mg total) by mouth daily.  . CRESTOR 40 MG tablet TAKE 1 TABLET BY MOUTH EVERY DAY  . fexofenadine (ALLEGRA)  180 MG tablet Take 180 mg by mouth daily.  . fluticasone (FLONASE) 50 MCG/ACT nasal spray USE 2 SPRAYS IN EACH NOSTRIL EVERY DAY  . levothyroxine (SYNTHROID, LEVOTHROID) 112 MCG tablet TAKE 1 TABLET (112 MCG TOTAL) BY MOUTH DAILY.  . metoprolol (LOPRESSOR) 100 MG tablet TAKE 1 TABLET (100 MG TOTAL)  BY MOUTH 2 (TWO) TIMES DAILY.  . Multiple Vitamin (MULTIVITAMIN) capsule Take 1 capsule by mouth daily.  . nitroGLYCERIN (NITROSTAT) 0.4 MG SL tablet Place 1 tablet (0.4 mg total) under the tongue every 5 (five) minutes as needed.  . Omega-3 Fatty Acids (FISH OIL) 1200 MG CAPS Take 1 capsule by mouth daily.  Marland Kitchen omeprazole (PRILOSEC) 20 MG capsule TAKE ONE CAPSULE BY MOUTH EVERY DAY  . Potassium Gluconate 550 MG TABS Take 1 tablet by mouth 2 (two) times daily.   Marland Kitchen tiotropium (SPIRIVA) 18 MCG inhalation capsule Place 1 capsule (18 mcg total) into inhaler and inhale daily.  Marland Kitchen UNABLE TO FIND Med Name: Sinus and Allergy relief PE Per box directions as needed   BP 122/80  Pulse 62  Temp(Src) 98 F (36.7 C) (Oral)  Ht 5' 3.75" (1.619 m)  Wt 169 lb (76.658 kg)  BMI 29.25 kg/m2  SpO2 95%   Review of Systems  Constitutional: Negative for fever, chills, appetite change, fatigue and unexpected weight change.  HENT: Negative for congestion, ear pain, sinus pressure, sore throat, trouble swallowing and voice change.   Eyes: Negative for visual disturbance.  Respiratory: Positive for cough (chronic) and wheezing. Negative for shortness of breath and stridor.   Cardiovascular: Negative for chest pain, palpitations and leg swelling.  Gastrointestinal: Negative for nausea, vomiting, abdominal pain, diarrhea, constipation, blood in stool, abdominal distention and anal bleeding.  Genitourinary: Negative for dysuria and flank pain.  Musculoskeletal: Negative for arthralgias, gait problem, myalgias and neck pain.  Skin: Negative for color change and rash.  Neurological: Negative for dizziness and headaches.  Hematological: Negative for adenopathy. Does not bruise/bleed easily.  Psychiatric/Behavioral: Positive for dysphoric mood. Negative for suicidal ideas and sleep disturbance. The patient is nervous/anxious.        Objective:   Physical Exam  Constitutional: She is oriented to person, place, and  time. She appears well-developed and well-nourished. No distress.  HENT:  Head: Normocephalic and atraumatic.  Right Ear: External ear normal.  Left Ear: External ear normal.  Nose: Nose normal.  Mouth/Throat: Oropharynx is clear and moist. No oropharyngeal exudate.  Eyes: Conjunctivae are normal. Pupils are equal, round, and reactive to light. Right eye exhibits no discharge. Left eye exhibits no discharge. No scleral icterus.  Neck: Normal range of motion. Neck supple. No tracheal deviation present. No thyromegaly present.  Cardiovascular: Normal rate, regular rhythm, normal heart sounds and intact distal pulses.  Exam reveals no gallop and no friction rub.   No murmur heard. Pulmonary/Chest: Effort normal. No accessory muscle usage. Not tachypneic. No respiratory distress. She has decreased breath sounds (prolonged expiratory phase). She has no wheezes. She has no rhonchi. She has no rales. She exhibits no tenderness. Right breast exhibits no inverted nipple, no mass, no nipple discharge, no skin change and no tenderness. Left breast exhibits no inverted nipple, no mass, no nipple discharge, no skin change and no tenderness. Breasts are symmetrical.  Abdominal: Soft. Bowel sounds are normal. She exhibits no distension and no mass. There is no tenderness. There is no rebound and no guarding.  Musculoskeletal: Normal range of motion. She exhibits no edema and no tenderness.  Lymphadenopathy:    She has no cervical adenopathy.  Neurological: She is alert and oriented to person, place, and time. No cranial nerve deficit. She exhibits normal muscle tone. Coordination normal.  Skin: Skin is warm and dry. No rash noted. She is not diaphoretic. No erythema. No pallor.  Psychiatric: She has a normal mood and affect. Her behavior is normal. Judgment and thought content normal.          Assessment & Plan:

## 2013-11-19 NOTE — Assessment & Plan Note (Signed)
General medical exam including breast exam normal today. PAP and pelvic deferred, as all previous PAPs normal and per pt preference. Mammogram ordered. Colonoscopy due, however pt prefers to put off repeat until next year. Encouraged healthy diet and regular exercise. Encouraged smoking cessation. Will check labs in 02/2014, per pt preference, as she has been off Crestor last 2 weeks. Pneumovax given today.

## 2013-11-20 ENCOUNTER — Telehealth: Payer: Self-pay

## 2013-11-20 DIAGNOSIS — J209 Acute bronchitis, unspecified: Secondary | ICD-10-CM | POA: Insufficient documentation

## 2013-11-20 NOTE — Assessment & Plan Note (Signed)
Her cough and mucus production are most likely bronchitis, but given the duration we need to check a chest x-ray to make sure there's nothing else going on.

## 2013-11-20 NOTE — Assessment & Plan Note (Signed)
We discussed this at length today. She is still not willing to quit. I offered Chantix and nicotine replacement.

## 2013-11-20 NOTE — Telephone Encounter (Signed)
Message copied by Velvet Bathe on Wed Nov 20, 2013  4:58 PM ------      Message from: Max Fickle B      Created: Wed Nov 20, 2013  2:58 PM       A,            Please let her know this is normal            Thanks      B ------

## 2013-11-20 NOTE — Assessment & Plan Note (Addendum)
[name] continues to have bronchitis despite recent antibiotics. Because of the amount and color of the mucus am going to go ahead and treat her again with some antibiotics. This is not going to stop until she quit smoking. I had a lengthy and very direct conversation with her about the negative effects of ongoing tobacco use. She is still not willing to quit.  Plan: -Quit smoking -Augmentin 5 days with GERD -Spiriva -Symbicort -Followup 6 months

## 2013-11-20 NOTE — Telephone Encounter (Signed)
Spoke with patient, she is aware.  Nothing further needed. Zoelle Markus L, CMA 

## 2013-11-22 ENCOUNTER — Encounter: Payer: Self-pay | Admitting: Emergency Medicine

## 2013-12-02 DIAGNOSIS — M9981 Other biomechanical lesions of cervical region: Secondary | ICD-10-CM | POA: Diagnosis not present

## 2013-12-02 DIAGNOSIS — M531 Cervicobrachial syndrome: Secondary | ICD-10-CM | POA: Diagnosis not present

## 2013-12-02 DIAGNOSIS — IMO0002 Reserved for concepts with insufficient information to code with codable children: Secondary | ICD-10-CM | POA: Diagnosis not present

## 2013-12-02 DIAGNOSIS — M999 Biomechanical lesion, unspecified: Secondary | ICD-10-CM | POA: Diagnosis not present

## 2013-12-10 DIAGNOSIS — M9981 Other biomechanical lesions of cervical region: Secondary | ICD-10-CM | POA: Diagnosis not present

## 2013-12-10 DIAGNOSIS — IMO0002 Reserved for concepts with insufficient information to code with codable children: Secondary | ICD-10-CM | POA: Diagnosis not present

## 2013-12-10 DIAGNOSIS — M531 Cervicobrachial syndrome: Secondary | ICD-10-CM | POA: Diagnosis not present

## 2013-12-10 DIAGNOSIS — M999 Biomechanical lesion, unspecified: Secondary | ICD-10-CM | POA: Diagnosis not present

## 2013-12-16 ENCOUNTER — Encounter: Payer: Self-pay | Admitting: Internal Medicine

## 2013-12-20 DIAGNOSIS — L723 Sebaceous cyst: Secondary | ICD-10-CM | POA: Diagnosis not present

## 2013-12-20 DIAGNOSIS — L821 Other seborrheic keratosis: Secondary | ICD-10-CM | POA: Diagnosis not present

## 2013-12-31 DIAGNOSIS — M999 Biomechanical lesion, unspecified: Secondary | ICD-10-CM | POA: Diagnosis not present

## 2013-12-31 DIAGNOSIS — IMO0002 Reserved for concepts with insufficient information to code with codable children: Secondary | ICD-10-CM | POA: Diagnosis not present

## 2013-12-31 DIAGNOSIS — M531 Cervicobrachial syndrome: Secondary | ICD-10-CM | POA: Diagnosis not present

## 2013-12-31 DIAGNOSIS — M9981 Other biomechanical lesions of cervical region: Secondary | ICD-10-CM | POA: Diagnosis not present

## 2014-02-03 ENCOUNTER — Telehealth: Payer: Self-pay | Admitting: Internal Medicine

## 2014-02-03 NOTE — Telephone Encounter (Signed)
Pt came in to office, dropped off paperwork to be completed by dr. Gilford Rile regarding Crestor.  Please fax back to [employer]

## 2014-02-04 DIAGNOSIS — M9981 Other biomechanical lesions of cervical region: Secondary | ICD-10-CM | POA: Diagnosis not present

## 2014-02-04 DIAGNOSIS — M999 Biomechanical lesion, unspecified: Secondary | ICD-10-CM | POA: Diagnosis not present

## 2014-02-04 DIAGNOSIS — M531 Cervicobrachial syndrome: Secondary | ICD-10-CM | POA: Diagnosis not present

## 2014-02-04 DIAGNOSIS — IMO0002 Reserved for concepts with insufficient information to code with codable children: Secondary | ICD-10-CM | POA: Diagnosis not present

## 2014-02-05 NOTE — Telephone Encounter (Signed)
Paper has been completed and faxed. Copies made and originals mailed to patient home address on file.

## 2014-02-07 ENCOUNTER — Ambulatory Visit (INDEPENDENT_AMBULATORY_CARE_PROVIDER_SITE_OTHER): Payer: Medicare Other | Admitting: Adult Health

## 2014-02-07 ENCOUNTER — Encounter: Payer: Self-pay | Admitting: Adult Health

## 2014-02-07 VITALS — BP 124/76 | HR 73 | Temp 98.4°F | Resp 16 | Wt 169.5 lb

## 2014-02-07 DIAGNOSIS — J329 Chronic sinusitis, unspecified: Secondary | ICD-10-CM | POA: Diagnosis not present

## 2014-02-07 MED ORDER — AMOXICILLIN-POT CLAVULANATE 875-125 MG PO TABS: 1.0000 | ORAL_TABLET | Freq: Two times a day (BID) | ORAL | Status: DC

## 2014-02-07 NOTE — Progress Notes (Signed)
Pre visit review using our clinic review tool, if applicable. No additional management support is needed unless otherwise documented below in the visit note. 

## 2014-02-07 NOTE — Progress Notes (Signed)
Patient ID: Erienne Spelman, female   DOB: 1946/08/15, 68 y.o.   MRN: [id number]    Subjective:    Patient ID: Joesph July, female    DOB: 27-Aug-1946, 68 y.o.   MRN: [id number]  HPI  Pt is a 68 y/o female who presents to clinic with 3 weeks of sinus symptoms, sinus pressure, rhinorrhea, scratching throat, cough and wheezing. She has been using her inhalers. Denies fever or chills.   Past Medical History  Diagnosis Date  . Depression   . Chicken pox   . GERD (gastroesophageal reflux disease)   . Hay fever   . History of blood transfusion   . Pneumonia, organism unspecified   . Hypothyroid   . COPD (chronic obstructive pulmonary disease)   . History of syncope 2000    negative EP study  . History of hypercholesterolemia   . Arrhythmia   . Hyperlipidemia     intolerance to statins except Crestor.   . Coronary artery disease     NSTEMI in 09/2008. Cath : 80% RCA and 95% first diagonal. PCI and 2 DES placement  RCA and 1 DES to diagonal. Complicated by acute diagonal stent thrombosis . Treated by thrombectomy. Most recent nuclear stress test in 2010 showed no ischemia with normal EF.   Marland Kitchen Hypertension   . MI (myocardial infarction)     Current Outpatient Prescriptions on File Prior to Visit  Medication Sig Dispense Refill  . albuterol (PROAIR HFA) 108 (90 BASE) MCG/ACT inhaler Inhale 2 puffs into the lungs every 6 (six) hours as needed.  18 g  12  . aspirin 81 MG tablet Take 81 mg by mouth daily.      Marland Kitchen azelastine (ASTELIN) 137 MCG/SPRAY nasal spray Place 1 spray into the nose 2 (two) times daily. Use in each nostril as directed  30 mL  5  . budesonide-formoterol (SYMBICORT) 160-4.5 MCG/ACT inhaler Inhale 2 puffs into the lungs 2 (two) times daily.  3 Inhaler  3  . buPROPion (WELLBUTRIN XL) 150 MG 24 hr tablet Take 1 tablet (150 mg total) by mouth daily.  30 tablet  3  . carisoprodol (SOMA) 350 MG tablet Take 1 tablet (350 mg total) by mouth 2 (two) times daily as needed for muscle  spasms.  60 tablet  3  . citalopram (CELEXA) 40 MG tablet Take 1 tablet (40 mg total) by mouth daily.  30 tablet  6  . CRESTOR 40 MG tablet TAKE 1 TABLET BY MOUTH EVERY DAY  30 tablet  5  . fluticasone (FLONASE) 50 MCG/ACT nasal spray USE 2 SPRAYS IN EACH NOSTRIL EVERY DAY  16 g  3  . levothyroxine (SYNTHROID, LEVOTHROID) 112 MCG tablet TAKE 1 TABLET (112 MCG TOTAL) BY MOUTH DAILY.  30 tablet  3  . metoprolol (LOPRESSOR) 100 MG tablet TAKE 1 TABLET (100 MG TOTAL) BY MOUTH 2 (TWO) TIMES DAILY.  180 tablet  3  . Multiple Vitamin (MULTIVITAMIN) capsule Take 1 capsule by mouth daily.      . nitroGLYCERIN (NITROSTAT) 0.4 MG SL tablet Place 1 tablet (0.4 mg total) under the tongue every 5 (five) minutes as needed.  25 tablet  3  . omeprazole (PRILOSEC) 20 MG capsule TAKE ONE CAPSULE BY MOUTH EVERY DAY  90 capsule  3  . tiotropium (SPIRIVA) 18 MCG inhalation capsule Place 1 capsule (18 mcg total) into inhaler and inhale daily.  30 capsule  12  . UNABLE TO FIND Med Name: Sinus and Allergy relief PE Per  box directions as needed       No current facility-administered medications on file prior to visit.     Review of Systems  Constitutional: Negative for fever and chills.  HENT: Positive for congestion, postnasal drip, rhinorrhea, sinus pressure and sneezing. Negative for sore throat.   Respiratory: Positive for cough and wheezing.        Objective:  BP 124/76  Pulse 73  Temp(Src) 98.4 F (36.9 C) (Oral)  Resp 16  Wt 169 lb 8 oz (76.885 kg)  SpO2 99%   Physical Exam  Constitutional: She is oriented to person, place, and time. She appears well-developed and well-nourished. No distress.  HENT:  Head: Normocephalic and atraumatic.  Right Ear: External ear normal.  Left Ear: External ear normal.  Mouth/Throat: No oropharyngeal exudate.  Cardiovascular: Normal rate, regular rhythm and normal heart sounds.  Exam reveals no gallop.   No murmur heard. Pulmonary/Chest: Effort normal and breath  sounds normal. No respiratory distress. She has no wheezes. She has no rales.  Lymphadenopathy:    She has cervical adenopathy.  Neurological: She is alert and oriented to person, place, and time.  Psychiatric: She has a normal mood and affect. Her behavior is normal. Judgment and thought content normal.       Assessment & Plan:   Sinusitis  Symptoms ongoing > 10 days. Start Augmentin bid x 7 days. Drink fluids to maintain hydration If no improvement with antibiotic will refer to ENT

## 2014-02-07 NOTE — Patient Instructions (Signed)
Start Augmentin twice a day for 7 days.  Drink fluids to maintain hydration.  Voice rest as much as possible.  Robitussin or Delsym for cough as needed.  Continue inhalers as you have been doing.

## 2014-02-11 ENCOUNTER — Other Ambulatory Visit: Payer: Medicare Other

## 2014-02-17 ENCOUNTER — Ambulatory Visit: Payer: Self-pay | Admitting: Internal Medicine

## 2014-02-17 DIAGNOSIS — Z1231 Encounter for screening mammogram for malignant neoplasm of breast: Secondary | ICD-10-CM | POA: Diagnosis not present

## 2014-02-17 LAB — HM MAMMOGRAPHY

## 2014-02-27 ENCOUNTER — Other Ambulatory Visit: Payer: Self-pay | Admitting: Internal Medicine

## 2014-03-04 DIAGNOSIS — M9981 Other biomechanical lesions of cervical region: Secondary | ICD-10-CM | POA: Diagnosis not present

## 2014-03-04 DIAGNOSIS — M531 Cervicobrachial syndrome: Secondary | ICD-10-CM | POA: Diagnosis not present

## 2014-03-04 DIAGNOSIS — IMO0002 Reserved for concepts with insufficient information to code with codable children: Secondary | ICD-10-CM | POA: Diagnosis not present

## 2014-03-04 DIAGNOSIS — M999 Biomechanical lesion, unspecified: Secondary | ICD-10-CM | POA: Diagnosis not present

## 2014-03-18 ENCOUNTER — Telehealth: Payer: Self-pay | Admitting: Internal Medicine

## 2014-03-18 ENCOUNTER — Other Ambulatory Visit: Payer: Self-pay | Admitting: Pulmonary Disease

## 2014-03-18 MED ORDER — TIOTROPIUM BROMIDE MONOHYDRATE 18 MCG IN CAPS: 18.0000 ug | ORAL_CAPSULE | Freq: Every day | RESPIRATORY_TRACT | Status: DC

## 2014-03-18 NOTE — Telephone Encounter (Signed)
Dr Lake Bells singed the spiriva pt assistance forms  I have mailed the original, along with rx to [facility] and mailed pt copies  Called the pt and left detailed msg on machine to let her know

## 2014-03-24 ENCOUNTER — Other Ambulatory Visit (INDEPENDENT_AMBULATORY_CARE_PROVIDER_SITE_OTHER): Payer: Medicare Other

## 2014-03-24 DIAGNOSIS — E785 Hyperlipidemia, unspecified: Secondary | ICD-10-CM | POA: Diagnosis not present

## 2014-03-24 DIAGNOSIS — Z0000 Encounter for general adult medical examination without abnormal findings: Secondary | ICD-10-CM | POA: Diagnosis not present

## 2014-03-24 DIAGNOSIS — J449 Chronic obstructive pulmonary disease, unspecified: Secondary | ICD-10-CM

## 2014-03-24 DIAGNOSIS — F411 Generalized anxiety disorder: Secondary | ICD-10-CM

## 2014-03-24 LAB — CBC WITH DIFFERENTIAL/PLATELET
BASOS PCT: 0.8 % (ref 0.0–3.0)
Basophils Absolute: 0.1 10*3/uL (ref 0.0–0.1)
EOS PCT: 2.5 % (ref 0.0–5.0)
Eosinophils Absolute: 0.2 10*3/uL (ref 0.0–0.7)
HCT: 44.2 % (ref 36.0–46.0)
HEMOGLOBIN: 14.9 g/dL (ref 12.0–15.0)
LYMPHS PCT: 25.8 % (ref 12.0–46.0)
Lymphs Abs: 2.3 10*3/uL (ref 0.7–4.0)
MCHC: 33.8 g/dL (ref 30.0–36.0)
MCV: 92.1 fl (ref 78.0–100.0)
MONOS PCT: 9.2 % (ref 3.0–12.0)
Monocytes Absolute: 0.8 10*3/uL (ref 0.1–1.0)
NEUTROS ABS: 5.4 10*3/uL (ref 1.4–7.7)
NEUTROS PCT: 61.7 % (ref 43.0–77.0)
Platelets: 323 10*3/uL (ref 150.0–400.0)
RBC: 4.8 Mil/uL (ref 3.87–5.11)
RDW: 14.7 % — ABNORMAL HIGH (ref 11.5–14.6)
WBC: 8.8 10*3/uL (ref 4.5–10.5)

## 2014-03-24 LAB — COMPREHENSIVE METABOLIC PANEL
ALT: 19 U/L (ref 0–35)
AST: 22 U/L (ref 0–37)
Albumin: 4 g/dL (ref 3.5–5.2)
Alkaline Phosphatase: 62 U/L (ref 39–117)
BILIRUBIN TOTAL: 0.7 mg/dL (ref 0.3–1.2)
BUN: 9 mg/dL (ref 6–23)
CALCIUM: 9.3 mg/dL (ref 8.4–10.5)
CHLORIDE: 102 meq/L (ref 96–112)
CO2: 26 meq/L (ref 19–32)
CREATININE: 0.8 mg/dL (ref 0.4–1.2)
GFR: 74.97 mL/min (ref >60.00)
Glucose, Bld: 101 mg/dL — ABNORMAL HIGH (ref 70–99)
Potassium: 4.4 mEq/L (ref 3.5–5.1)
Sodium: 137 mEq/L (ref 135–145)
Total Protein: 7.1 g/dL (ref 6.0–8.3)

## 2014-03-24 LAB — MICROALBUMIN / CREATININE URINE RATIO
Creatinine,U: 60.4 mg/dL
Microalb Creat Ratio: 0.2 mg/g (ref 0.0–30.0)
Microalb, Ur: 0.1 mg/dL (ref 0.0–1.9)

## 2014-03-24 LAB — LIPID PANEL
CHOL/HDL RATIO: 3
Cholesterol: 166 mg/dL (ref 0–200)
HDL: 51.3 mg/dL (ref >39.00)
LDL Cholesterol: 75 mg/dL (ref 0–99)
Triglycerides: 201 mg/dL — ABNORMAL HIGH (ref 0.0–149.0)
VLDL: 40.2 mg/dL — AB (ref 0.0–40.0)

## 2014-03-24 LAB — TSH: TSH: 0.52 u[IU]/mL (ref 0.35–5.50)

## 2014-03-25 DIAGNOSIS — M9981 Other biomechanical lesions of cervical region: Secondary | ICD-10-CM | POA: Diagnosis not present

## 2014-03-25 DIAGNOSIS — M999 Biomechanical lesion, unspecified: Secondary | ICD-10-CM | POA: Diagnosis not present

## 2014-03-25 DIAGNOSIS — M531 Cervicobrachial syndrome: Secondary | ICD-10-CM | POA: Diagnosis not present

## 2014-03-25 DIAGNOSIS — IMO0002 Reserved for concepts with insufficient information to code with codable children: Secondary | ICD-10-CM | POA: Diagnosis not present

## 2014-03-26 ENCOUNTER — Other Ambulatory Visit: Payer: Self-pay | Admitting: Internal Medicine

## 2014-03-28 ENCOUNTER — Other Ambulatory Visit: Payer: Self-pay | Admitting: Internal Medicine

## 2014-03-31 ENCOUNTER — Ambulatory Visit (INDEPENDENT_AMBULATORY_CARE_PROVIDER_SITE_OTHER): Payer: Medicare Other | Admitting: Internal Medicine

## 2014-03-31 ENCOUNTER — Encounter: Payer: Self-pay | Admitting: Internal Medicine

## 2014-03-31 VITALS — BP 118/70 | HR 62 | Temp 97.9°F | Resp 16 | Wt 170.5 lb

## 2014-03-31 DIAGNOSIS — F411 Generalized anxiety disorder: Secondary | ICD-10-CM | POA: Diagnosis not present

## 2014-03-31 DIAGNOSIS — F419 Anxiety disorder, unspecified: Secondary | ICD-10-CM

## 2014-03-31 DIAGNOSIS — J449 Chronic obstructive pulmonary disease, unspecified: Secondary | ICD-10-CM | POA: Diagnosis not present

## 2014-03-31 MED ORDER — LEVOTHYROXINE SODIUM 112 MCG PO TABS: 112.0000 ug | ORAL_TABLET | Freq: Every day | ORAL | Status: DC

## 2014-03-31 MED ORDER — CITALOPRAM HYDROBROMIDE 40 MG PO TABS: 40.0000 mg | ORAL_TABLET | Freq: Every day | ORAL | Status: DC

## 2014-03-31 NOTE — Progress Notes (Signed)
   Subjective:    Patient ID: Jodi Beasley, female    DOB: October 08, 1946, 68 y.o.   MRN: [id number]  HPI 68YO female with COPD presents for follow up. Doing well. No recent episodes of bronchitis. Recently filed for bankruptcy. Caring for her 64 YO and 9YO grandchildren. No new concerns today.  Review of Systems  Constitutional: Negative for fever, chills, appetite change, fatigue and unexpected weight change.  HENT: Negative for congestion, ear pain, sinus pressure, sore throat, trouble swallowing and voice change.   Eyes: Negative for visual disturbance.  Respiratory: Negative for cough, shortness of breath, wheezing and stridor.   Cardiovascular: Negative for chest pain, palpitations and leg swelling.  Gastrointestinal: Negative for nausea, vomiting, abdominal pain, diarrhea, constipation, blood in stool, abdominal distention and anal bleeding.  Genitourinary: Negative for dysuria and flank pain.  Musculoskeletal: Negative for arthralgias, gait problem, myalgias and neck pain.  Skin: Negative for color change and rash.  Neurological: Negative for dizziness and headaches.  Hematological: Negative for adenopathy. Does not bruise/bleed easily.  Psychiatric/Behavioral: Negative for suicidal ideas, sleep disturbance and dysphoric mood. The patient is not nervous/anxious.        Objective:    BP 118/70  Pulse 62  Temp(Src) 97.9 F (36.6 C) (Oral)  Resp 16  Wt 170 lb 8 oz (77.338 kg)  SpO2 96% Physical Exam  Constitutional: She is oriented to person, place, and time. She appears well-developed and well-nourished. No distress.  HENT:  Head: Normocephalic and atraumatic.  Right Ear: External ear normal.  Left Ear: External ear normal.  Nose: Nose normal.  Mouth/Throat: Oropharynx is clear and moist. No oropharyngeal exudate.  Eyes: Conjunctivae are normal. Pupils are equal, round, and reactive to light. Right eye exhibits no discharge. Left eye exhibits no discharge. No scleral  icterus.  Neck: Normal range of motion. Neck supple. No tracheal deviation present. No thyromegaly present.  Cardiovascular: Normal rate, regular rhythm, normal heart sounds and intact distal pulses.  Exam reveals no gallop and no friction rub.   No murmur heard. Pulmonary/Chest: Effort normal and breath sounds normal. No accessory muscle usage. Not tachypneic. No respiratory distress. She has no decreased breath sounds. She has no wheezes. She has no rhonchi. She has no rales. She exhibits no tenderness.  Musculoskeletal: Normal range of motion. She exhibits no edema and no tenderness.  Lymphadenopathy:    She has no cervical adenopathy.  Neurological: She is alert and oriented to person, place, and time. No cranial nerve deficit. She exhibits normal muscle tone. Coordination normal.  Skin: Skin is warm and dry. No rash noted. She is not diaphoretic. No erythema. No pallor.  Psychiatric: She has a normal mood and affect. Her behavior is normal. Judgment and thought content normal.          Assessment & Plan:  Over 69min of which >50% spent in face-to-face contact with patient discussing plan of care  Problem List Items Addressed This Visit   Anxiety - Primary     Symptoms well controlled on current medications. Will continue.    Relevant Medications      citalopram (CELEXA) tablet   COPD (chronic obstructive pulmonary disease) with chronic bronchitis     Doing well with no recent episodes of bronchitis. Continue Symbicort and Spiriva. Encourage smoking cessation.    Relevant Medications      diphenhydrAMINE (BENADRYL) 25 MG tablet       Return in about 6 months (around 09/30/2014) for Wellness Visit.

## 2014-03-31 NOTE — Assessment & Plan Note (Addendum)
Symptoms well controlled on current medications. Offered support given ongoing financial and family issues. Will continue current medication.

## 2014-03-31 NOTE — Progress Notes (Signed)
Pre visit review using our clinic review tool, if applicable. No additional management support is needed unless otherwise documented below in the visit note. 

## 2014-03-31 NOTE — Assessment & Plan Note (Signed)
Doing well with no recent episodes of bronchitis. Continue Symbicort and Spiriva. Encourage smoking cessation.

## 2014-04-03 ENCOUNTER — Encounter: Payer: Self-pay | Admitting: Internal Medicine

## 2014-04-03 DIAGNOSIS — F329 Major depressive disorder, single episode, unspecified: Secondary | ICD-10-CM

## 2014-04-03 DIAGNOSIS — F32A Depression, unspecified: Secondary | ICD-10-CM

## 2014-04-22 DIAGNOSIS — M531 Cervicobrachial syndrome: Secondary | ICD-10-CM | POA: Diagnosis not present

## 2014-04-22 DIAGNOSIS — M9981 Other biomechanical lesions of cervical region: Secondary | ICD-10-CM | POA: Diagnosis not present

## 2014-04-22 DIAGNOSIS — IMO0002 Reserved for concepts with insufficient information to code with codable children: Secondary | ICD-10-CM | POA: Diagnosis not present

## 2014-04-22 DIAGNOSIS — M999 Biomechanical lesion, unspecified: Secondary | ICD-10-CM | POA: Diagnosis not present

## 2014-05-06 ENCOUNTER — Encounter: Payer: Self-pay | Admitting: Pulmonary Disease

## 2014-05-06 ENCOUNTER — Ambulatory Visit (INDEPENDENT_AMBULATORY_CARE_PROVIDER_SITE_OTHER): Payer: Medicare Other | Admitting: Pulmonary Disease

## 2014-05-06 VITALS — BP 122/66 | HR 66 | Ht 65.0 in | Wt 170.0 lb

## 2014-05-06 DIAGNOSIS — J449 Chronic obstructive pulmonary disease, unspecified: Secondary | ICD-10-CM | POA: Diagnosis not present

## 2014-05-06 DIAGNOSIS — F17200 Nicotine dependence, unspecified, uncomplicated: Secondary | ICD-10-CM | POA: Diagnosis not present

## 2014-05-06 DIAGNOSIS — Z720 Tobacco use: Secondary | ICD-10-CM

## 2014-05-06 NOTE — Progress Notes (Signed)
Subjective:    Patient ID: Jodi Beasley, female    DOB: 1946-07-13, 68 y.o.   MRN: [id number]  Synopsis: [name] established care with the [facility] pulmonary clinic in May 2013 for COPD. She simple spirometry at that time with a ratio of 71%, the flow volume loop consistent with obstruction and an FEV1 of 1.92 L or 77% predicted. She was followed previously by Dr. Efraim Kaufmann, a pulmonologist in the [place], [place] area. She had been treated for pneumonia 3 weeks prior to her initial visit.  She has smoked one half pack of cigarettes per day for 53 years and continues to smoke.  HPI    05/06/2014 ROV > Teairra has not had much trouble with her breathing lately. However, her husband died a month ago and so she's been very, very stressed out. She's not had trouble with cough, shortness of breath or chest tightness. She continues to take her medications as ordered. Her weight has been stable. She continues to smoke cigarettes on a daily basis.   Past Medical History  Diagnosis Date  . Depression   . Chicken pox   . GERD (gastroesophageal reflux disease)   . Hay fever   . History of blood transfusion   . Pneumonia, organism unspecified   . Hypothyroid   . COPD (chronic obstructive pulmonary disease)   . History of syncope 2000    negative EP study  . History of hypercholesterolemia   . Arrhythmia   . Hyperlipidemia     intolerance to statins except Crestor.   . Coronary artery disease     NSTEMI in 09/2008. Cath : 80% RCA and 95% first diagonal. PCI and 2 DES placement  RCA and 1 DES to diagonal. Complicated by acute diagonal stent thrombosis . Treated by thrombectomy. Most recent nuclear stress test in 2010 showed no ischemia with normal EF.   Marland Kitchen Hypertension   . MI (myocardial infarction)     Review of Systems  Constitutional: Negative for fever, fatigue and unexpected weight change.  HENT: Negative for congestion, postnasal drip, rhinorrhea and sinus pressure.    Respiratory: Positive for cough and shortness of breath. Negative for choking.   Cardiovascular: Negative for chest pain and leg swelling.       Objective:   Physical Exam  Filed Vitals:   05/06/14 1037  BP: 122/66  Pulse: 66  Height: 5\' 5"  (1.651 m)  Weight: 170 lb (77.111 kg)  SpO2: 96%   room air  Gen: well appearing, no acute distress HEENT: NCAT, EOMi, OP clear,  PULM: Clear to auscultation bilaterally, good air movement CV: RRR, no mgr, no JVD AB: BS+, soft, nontender, no hsm Ext: warm, no edema, no clubbing, no cyanosis   04/09/2012 Simple Spirometry: Ratio 71%, FEV1 1.92L (77% pred); flow volume loop consistent with obstruction     Assessment & Plan:   COPD (chronic obstructive pulmonary disease) with chronic bronchitis From a respiratory standpoint this is been a stable interval for Jodi Beasley. However, she has been struggling because her husband just died a month ago.  She desperately needs to quit smoking the nose can be a difficult time.  Plan: -Continue current medications -Return to clinic in 3 months rather than 6 months her that we can readdress lung cancer screening and smoking cessation  Tobacco abuse As noted above return to clinic in 3 months that we can address smoking cessation and lung cancer screening.    Updated Medication List Outpatient Encounter Prescriptions as of 05/06/2014  Medication Sig  . albuterol (PROAIR HFA) 108 (90 BASE) MCG/ACT inhaler Inhale 2 puffs into the lungs every 6 (six) hours as needed.  Marland Kitchen aspirin 81 MG tablet Take 81 mg by mouth daily.  . budesonide-formoterol (SYMBICORT) 160-4.5 MCG/ACT inhaler Inhale 2 puffs into the lungs 2 (two) times daily.  Marland Kitchen buPROPion (WELLBUTRIN XL) 150 MG 24 hr tablet TAKE 1 TABLET BY MOUTH DAILY.  . carisoprodol (SOMA) 350 MG tablet Take 1 tablet (350 mg total) by mouth 2 (two) times daily as needed for muscle spasms.  . citalopram (CELEXA) 40 MG tablet Take 1 tablet (40 mg total) by mouth daily.   . CRESTOR 40 MG tablet TAKE 1 TABLET BY MOUTH EVERY DAY  . diphenhydrAMINE (BENADRYL) 25 MG tablet Take 25 mg by mouth every 6 (six) hours as needed.  . fluticasone (FLONASE) 50 MCG/ACT nasal spray USE 2 SPRAYS IN EACH NOSTRIL EVERY DAY  . levothyroxine (SYNTHROID, LEVOTHROID) 112 MCG tablet Take 1 tablet (112 mcg total) by mouth daily before breakfast.  . metoprolol (LOPRESSOR) 100 MG tablet TAKE 1 TABLET (100 MG TOTAL) BY MOUTH 2 (TWO) TIMES DAILY.  . Multiple Vitamin (MULTIVITAMIN) capsule Take 1 capsule by mouth daily.  . nitroGLYCERIN (NITROSTAT) 0.4 MG SL tablet Place 1 tablet (0.4 mg total) under the tongue every 5 (five) minutes as needed.  . Omega-3 Fatty Acids (FISH OIL) 1200 MG CAPS Take 1 capsule by mouth daily.  Marland Kitchen omeprazole (PRILOSEC) 20 MG capsule TAKE ONE CAPSULE BY MOUTH EVERY DAY  . tiotropium (SPIRIVA) 18 MCG inhalation capsule Place 1 capsule (18 mcg total) into inhaler and inhale daily.  Marland Kitchen UNABLE TO FIND Med Name: Sinus and Allergy relief PE Per box directions as needed

## 2014-05-06 NOTE — Assessment & Plan Note (Signed)
From a respiratory standpoint this is been a stable interval for Jodi Beasley. However, she has been struggling because her husband just died a month ago.  She desperately needs to quit smoking the nose can be a difficult time.  Plan: -Continue current medications -Return to clinic in 3 months rather than 6 months her that we can readdress lung cancer screening and smoking cessation

## 2014-05-06 NOTE — Patient Instructions (Signed)
Quit smoking Think about lung cancer screening We will see you back in 3 months or sooner if needed

## 2014-05-06 NOTE — Assessment & Plan Note (Signed)
As noted above return to clinic in 3 months that we can address smoking cessation and lung cancer screening.

## 2014-05-13 ENCOUNTER — Encounter: Payer: Self-pay | Admitting: Cardiovascular Disease

## 2014-05-13 ENCOUNTER — Ambulatory Visit (INDEPENDENT_AMBULATORY_CARE_PROVIDER_SITE_OTHER): Payer: Medicare Other | Admitting: Cardiovascular Disease

## 2014-05-13 VITALS — BP 118/80 | HR 60 | Resp 16 | Ht 65.0 in | Wt 171.5 lb

## 2014-05-13 DIAGNOSIS — I2510 Atherosclerotic heart disease of native coronary artery without angina pectoris: Secondary | ICD-10-CM | POA: Diagnosis not present

## 2014-05-13 DIAGNOSIS — I10 Essential (primary) hypertension: Secondary | ICD-10-CM | POA: Diagnosis not present

## 2014-05-13 DIAGNOSIS — E785 Hyperlipidemia, unspecified: Secondary | ICD-10-CM | POA: Diagnosis not present

## 2014-05-13 NOTE — Progress Notes (Signed)
Patient ID: Jodi Beasley, female   DOB: [birth year], 68 y.o.   MRN: [id number]      Reason for office visit CAD  Jodi Beasley lost her husband Jodi Beasley on April 29. He was also our patient. Pacemaker interrogation shows that he died of ventricular fibrillation in the middle of the night.  She is still in mourning and is actually going to see a therapist soon. She has not had problems with angina pectoris. She takes a maximum dose of a highly effective statin and has generally acceptable lipid parameters, with the exception of a stubbornly high triglyceride level despite use of omega-3 fatty acid supplements . Her blood pressure is excellent and she does not have diabetes mellitus. Unfortunately she continues to smoke despite my admonitions and those of her pulmonologist, Dr. Lake Beasley. She has no cardiac complaints.  She has a remote history of coronary problems, previously received her care in [place]. She had a small non-ST segment elevation myocardial infarction and October of 2009 and underwent placement of 2 drug-eluting stents: to the right coronary artery into the diagonal artery respectively. Immediately following this procedure she had lateral ST segment elevation secondary to acute diagonal stent thrombosis. This was treated with thrombectomy and balloon angioplasty. In September of 2014 she had chest pain for the first time in a very long time. A followup nuclear vasodilator stress test was normal. She has normal left ventricular systolic function.  She takes a statin, beta blocker and aspirin. She has sciatica which limits her ability to exercise.  She has clear-cut symptoms and physical findings of emphysema and sees Dr. Simonne Beasley. Her FEV1 was roughly 75% of predicted. She has smoked roughly 1/2 pack of cigarettes a day for over 50 years. She tells me that she has tried to quit smoking many many times. Once she was able to quit for a whole year.    Allergies  Allergen Reactions  .  Benzocaine     Tongue swelling  . Darvon [Propoxyphene Hcl]     HA  . Neosporin [Neomycin-Bacitracin Zn-Polymyx]     blisters  . Nicoderm [Nicotine]     arrythmia    Current Outpatient Prescriptions  Medication Sig Dispense Refill  . albuterol (PROAIR HFA) 108 (90 BASE) MCG/ACT inhaler Inhale 2 puffs into the lungs every 6 (six) hours as needed.  18 g  12  . aspirin 81 MG tablet Take 81 mg by mouth daily.      . budesonide-formoterol (SYMBICORT) 160-4.5 MCG/ACT inhaler Inhale 2 puffs into the lungs 2 (two) times daily.  3 Inhaler  3  . buPROPion (WELLBUTRIN XL) 150 MG 24 hr tablet TAKE 1 TABLET BY MOUTH DAILY.  30 tablet  2  . carisoprodol (SOMA) 350 MG tablet Take 1 tablet (350 mg total) by mouth 2 (two) times daily as needed for muscle spasms.  60 tablet  3  . citalopram (CELEXA) 40 MG tablet Take 1 tablet (40 mg total) by mouth daily.  30 tablet  6  . CRESTOR 40 MG tablet TAKE 1 TABLET BY MOUTH EVERY DAY  30 tablet  5  . diphenhydrAMINE (BENADRYL) 25 MG tablet Take 25 mg by mouth every 6 (six) hours as needed.      . fluticasone (FLONASE) 50 MCG/ACT nasal spray USE 2 SPRAYS IN EACH NOSTRIL EVERY DAY  16 g  3  . levothyroxine (SYNTHROID, LEVOTHROID) 112 MCG tablet Take 1 tablet (112 mcg total) by mouth daily before breakfast.  30 tablet  11  .  metoprolol (LOPRESSOR) 100 MG tablet TAKE 1 TABLET (100 MG TOTAL) BY MOUTH 2 (TWO) TIMES DAILY.  180 tablet  3  . Multiple Vitamin (MULTIVITAMIN) capsule Take 1 capsule by mouth daily.      . nitroGLYCERIN (NITROSTAT) 0.4 MG SL tablet Place 1 tablet (0.4 mg total) under the tongue every 5 (five) minutes as needed.  25 tablet  3  . Omega-3 Fatty Acids (FISH OIL) 1200 MG CAPS Take 1 capsule by mouth daily.      Marland Kitchen omeprazole (PRILOSEC) 20 MG capsule TAKE ONE CAPSULE BY MOUTH EVERY DAY  90 capsule  1  . tiotropium (SPIRIVA) 18 MCG inhalation capsule Place 1 capsule (18 mcg total) into inhaler and inhale daily.  90 capsule  3  . UNABLE TO FIND Med  Name: Sinus and Allergy relief PE Per box directions as needed       No current facility-administered medications for this visit.    Past Medical History  Diagnosis Date  . Depression   . Chicken pox   . GERD (gastroesophageal reflux disease)   . Hay fever   . History of blood transfusion   . Pneumonia, organism unspecified   . Hypothyroid   . COPD (chronic obstructive pulmonary disease)   . History of syncope 2000    negative EP study  . History of hypercholesterolemia   . Arrhythmia   . Hyperlipidemia     intolerance to statins except Crestor.   . Coronary artery disease     NSTEMI in 09/2008. Cath : 80% RCA and 95% first diagonal. PCI and 2 DES placement  RCA and 1 DES to diagonal. Complicated by acute diagonal stent thrombosis . Treated by thrombectomy. Most recent nuclear stress test in 2010 showed no ischemia with normal EF.   Marland Kitchen Hypertension   . MI (myocardial infarction)     Past Surgical History  Procedure Laterality Date  . Tubal ligation    . Tonsillectomy    . Coronary angioplasty with stent placement  2009    [facility], drug-eluting mid RCA,prox diag;    Family History  Problem Relation Age of Onset  . Colon cancer Father   . Lung cancer Father     was a former smoker  . Cancer Father     lung  . Hypertension    . Diabetes    . Hypertension Mother     History   Social History  . Marital Status: Widowed    Spouse Name: N/A    Number of Children: N/A  . Years of Education: N/A   Occupational History  . Not on file.   Social History Main Topics  . Smoking status: Current Every Day Smoker -- 1.00 packs/day for 53 years    Types: Cigarettes    Start date: 11/19/1960  . Smokeless tobacco: Never Used  . Alcohol Use: 0.5 oz/week    1 drink(s) per week  . Drug Use: No  . Sexual Activity: Not on file   Other Topics Concern  . Not on file   Social History Narrative   Lives with partner in [place]. 2 cats 2 dogs in home.   Recently moved from  [facility].   Custody of 30month old.    Review of systems: The patient specifically denies any chest pain at rest or with exertion, dyspnea at rest or with exertion, orthopnea, paroxysmal nocturnal dyspnea, syncope, palpitations, focal neurological deficits, intermittent claudication, lower extremity edema, unexplained weight gain, cough, hemoptysis or wheezing.  The  patient also denies abdominal pain, nausea, vomiting, dysphagia, diarrhea, constipation, polyuria, polydipsia, dysuria, hematuria, frequency, urgency, abnormal bleeding or bruising, fever, chills, unexpected weight changes, mood swings, change in skin or hair texture, change in voice quality, auditory or visual problems, allergic reactions or rashes, new musculoskeletal complaints other than usual "aches and pains".   PHYSICAL EXAM BP 118/80  Pulse 60  Resp 16  Ht 5\' 5"  (1.651 m)  Wt 171 lb 8 oz (77.792 kg)  BMI 28.54 kg/m2 General: Alert, oriented x3, no distress  Head: no evidence of trauma, PERRL, EOMI, no exophtalmos or lid lag, no myxedema, no xanthelasma; normal ears, nose and oropharynx  Neck: normal jugular venous pulsations and no hepatojugular reflux; brisk carotid pulses without delay and no carotid bruits  Chest: no signs of consolidation by percussion or palpation, normal fremitus, symmetrical and full respiratory excursions. Breath sounds are distant. She has a little bit of upper airway stridor and some very faint expiratory wheezes  Cardiovascular: normal position and quality of the apical impulse, regular rhythm, normal first and second heart sounds, no murmurs, rubs or gallops  Abdomen: no tenderness or distention, no masses by palpation, no abnormal pulsatility or arterial bruits, normal bowel sounds, no hepatosplenomegaly  Extremities: no clubbing, cyanosis or edema; 2+ radial, ulnar and brachial pulses bilaterally; 2+ right femoral, posterior tibial and dorsalis pedis pulses; 2+ left femoral, posterior  tibial and dorsalis pedis pulses; no subclavian or femoral bruits  Neurological: grossly nonfocal   EKG: Normal tracing, normal sinus rhythm  Lipid Panel     Component Value Date/Time   CHOL 166 03/24/2014 1015   TRIG 201.0* 03/24/2014 1015   HDL 51.30 03/24/2014 1015   CHOLHDL 3 03/24/2014 1015   VLDL 40.2* 03/24/2014 1015   LDLCALC 75 03/24/2014 1015    BMET    Component Value Date/Time   NA 137 03/24/2014 1015   K 4.4 03/24/2014 1015   CL 102 03/24/2014 1015   CO2 26 03/24/2014 1015   GLUCOSE 101* 03/24/2014 1015   BUN 9 03/24/2014 1015   CREATININE 0.8 03/24/2014 1015   CALCIUM 9.3 03/24/2014 1015     ASSESSMENT AND PLAN Coronary artery disease Currently asymptomatic. Her coronary risk factors are generally well addressed with the exception of ongoing tobacco abuse. With her recent loss, now is probably not a good time to attend smoking cessation but this needs to maintain a consistent long-term goal.  Hypertension Well-controlled on beta blocker monotherapy  Other and unspecified hyperlipidemia Her LDL cholesterol is excellent when compared to her baseline. There has been a greater than 50% reduction. She still has mild residual hypertriglyceridemia. She prefers not to add another medication.   Patient Instructions  Your physician recommends that you schedule a follow-up appointment in: 6 months with Dr.Anedra Penafiel     Orders Placed This Encounter  Procedures  . EKG 12-Lead   No orders of the defined types were placed in this encounter.    Vinie Charity  Sanda Klein, MD, [facility] [phone] office [phone] pager

## 2014-05-13 NOTE — Patient Instructions (Signed)
Your physician recommends that you schedule a follow-up appointment in: 6 months with Dr.Croitoru    

## 2014-05-13 NOTE — Assessment & Plan Note (Signed)
Well-controlled on beta blocker monotherapy

## 2014-05-13 NOTE — Assessment & Plan Note (Signed)
Currently asymptomatic. Her coronary risk factors are generally well addressed with the exception of ongoing tobacco abuse. With her recent loss, now is probably not a good time to attend smoking cessation but this needs to maintain a consistent long-term goal.

## 2014-05-13 NOTE — Assessment & Plan Note (Signed)
Her LDL cholesterol is excellent when compared to her baseline. There has been a greater than 50% reduction. She still has mild residual hypertriglyceridemia. She prefers not to add another medication.

## 2014-05-14 ENCOUNTER — Ambulatory Visit (INDEPENDENT_AMBULATORY_CARE_PROVIDER_SITE_OTHER): Payer: Medicare Other | Admitting: Psychology

## 2014-05-14 ENCOUNTER — Ambulatory Visit: Payer: Medicare Other | Admitting: Psychology

## 2014-05-14 DIAGNOSIS — F4323 Adjustment disorder with mixed anxiety and depressed mood: Secondary | ICD-10-CM

## 2014-05-20 DIAGNOSIS — M9981 Other biomechanical lesions of cervical region: Secondary | ICD-10-CM | POA: Diagnosis not present

## 2014-05-20 DIAGNOSIS — M999 Biomechanical lesion, unspecified: Secondary | ICD-10-CM | POA: Diagnosis not present

## 2014-05-20 DIAGNOSIS — M531 Cervicobrachial syndrome: Secondary | ICD-10-CM | POA: Diagnosis not present

## 2014-05-20 DIAGNOSIS — IMO0002 Reserved for concepts with insufficient information to code with codable children: Secondary | ICD-10-CM | POA: Diagnosis not present

## 2014-05-28 ENCOUNTER — Other Ambulatory Visit: Payer: Self-pay | Admitting: Internal Medicine

## 2014-05-28 NOTE — Telephone Encounter (Signed)
Okay to refill? 

## 2014-05-28 NOTE — Telephone Encounter (Signed)
Called Rx into [facility] 

## 2014-06-03 ENCOUNTER — Ambulatory Visit (INDEPENDENT_AMBULATORY_CARE_PROVIDER_SITE_OTHER): Payer: Medicare Other | Admitting: Psychology

## 2014-06-03 DIAGNOSIS — F4323 Adjustment disorder with mixed anxiety and depressed mood: Secondary | ICD-10-CM | POA: Diagnosis not present

## 2014-06-17 DIAGNOSIS — IMO0002 Reserved for concepts with insufficient information to code with codable children: Secondary | ICD-10-CM | POA: Diagnosis not present

## 2014-06-17 DIAGNOSIS — M531 Cervicobrachial syndrome: Secondary | ICD-10-CM | POA: Diagnosis not present

## 2014-06-17 DIAGNOSIS — M999 Biomechanical lesion, unspecified: Secondary | ICD-10-CM | POA: Diagnosis not present

## 2014-06-17 DIAGNOSIS — M9981 Other biomechanical lesions of cervical region: Secondary | ICD-10-CM | POA: Diagnosis not present

## 2014-06-25 ENCOUNTER — Ambulatory Visit (INDEPENDENT_AMBULATORY_CARE_PROVIDER_SITE_OTHER): Payer: Medicare Other | Admitting: Psychology

## 2014-06-25 DIAGNOSIS — F4323 Adjustment disorder with mixed anxiety and depressed mood: Secondary | ICD-10-CM

## 2014-06-26 ENCOUNTER — Other Ambulatory Visit: Payer: Self-pay | Admitting: Internal Medicine

## 2014-07-01 DIAGNOSIS — L819 Disorder of pigmentation, unspecified: Secondary | ICD-10-CM | POA: Diagnosis not present

## 2014-07-01 DIAGNOSIS — L723 Sebaceous cyst: Secondary | ICD-10-CM | POA: Diagnosis not present

## 2014-07-08 ENCOUNTER — Ambulatory Visit: Payer: Medicare Other | Admitting: Psychology

## 2014-07-08 ENCOUNTER — Ambulatory Visit (INDEPENDENT_AMBULATORY_CARE_PROVIDER_SITE_OTHER): Payer: Medicare Other | Admitting: Psychology

## 2014-07-08 DIAGNOSIS — F4323 Adjustment disorder with mixed anxiety and depressed mood: Secondary | ICD-10-CM

## 2014-07-08 DIAGNOSIS — M9981 Other biomechanical lesions of cervical region: Secondary | ICD-10-CM | POA: Diagnosis not present

## 2014-07-08 DIAGNOSIS — M531 Cervicobrachial syndrome: Secondary | ICD-10-CM | POA: Diagnosis not present

## 2014-07-08 DIAGNOSIS — M999 Biomechanical lesion, unspecified: Secondary | ICD-10-CM | POA: Diagnosis not present

## 2014-07-08 DIAGNOSIS — IMO0002 Reserved for concepts with insufficient information to code with codable children: Secondary | ICD-10-CM | POA: Diagnosis not present

## 2014-07-29 ENCOUNTER — Ambulatory Visit (INDEPENDENT_AMBULATORY_CARE_PROVIDER_SITE_OTHER): Payer: Medicare Other | Admitting: Psychology

## 2014-07-29 DIAGNOSIS — F4323 Adjustment disorder with mixed anxiety and depressed mood: Secondary | ICD-10-CM | POA: Diagnosis not present

## 2014-08-05 DIAGNOSIS — M9981 Other biomechanical lesions of cervical region: Secondary | ICD-10-CM | POA: Diagnosis not present

## 2014-08-05 DIAGNOSIS — IMO0002 Reserved for concepts with insufficient information to code with codable children: Secondary | ICD-10-CM | POA: Diagnosis not present

## 2014-08-05 DIAGNOSIS — M999 Biomechanical lesion, unspecified: Secondary | ICD-10-CM | POA: Diagnosis not present

## 2014-08-05 DIAGNOSIS — M531 Cervicobrachial syndrome: Secondary | ICD-10-CM | POA: Diagnosis not present

## 2014-08-12 ENCOUNTER — Other Ambulatory Visit: Payer: Self-pay | Admitting: Internal Medicine

## 2014-08-12 NOTE — Telephone Encounter (Signed)
Last refill 7.25.15, last OV 4.27.15, next OV 12.22.15.  Please advise refill

## 2014-08-14 ENCOUNTER — Other Ambulatory Visit: Payer: Self-pay | Admitting: Internal Medicine

## 2014-08-14 NOTE — Telephone Encounter (Signed)
Last refill 7.25.15, last OV 4.27.15, next OV 12.22.15.  Please advise refill

## 2014-08-15 ENCOUNTER — Other Ambulatory Visit: Payer: Self-pay | Admitting: Internal Medicine

## 2014-08-15 NOTE — Telephone Encounter (Signed)
Okay to refill? 

## 2014-08-26 DIAGNOSIS — M531 Cervicobrachial syndrome: Secondary | ICD-10-CM | POA: Diagnosis not present

## 2014-08-26 DIAGNOSIS — M9981 Other biomechanical lesions of cervical region: Secondary | ICD-10-CM | POA: Diagnosis not present

## 2014-08-26 DIAGNOSIS — IMO0002 Reserved for concepts with insufficient information to code with codable children: Secondary | ICD-10-CM | POA: Diagnosis not present

## 2014-08-26 DIAGNOSIS — M999 Biomechanical lesion, unspecified: Secondary | ICD-10-CM | POA: Diagnosis not present

## 2014-09-02 ENCOUNTER — Ambulatory Visit (INDEPENDENT_AMBULATORY_CARE_PROVIDER_SITE_OTHER): Payer: Medicare Other | Admitting: Psychology

## 2014-09-02 DIAGNOSIS — F4323 Adjustment disorder with mixed anxiety and depressed mood: Secondary | ICD-10-CM

## 2014-09-16 ENCOUNTER — Other Ambulatory Visit: Payer: Self-pay | Admitting: Internal Medicine

## 2014-09-18 DIAGNOSIS — H43813 Vitreous degeneration, bilateral: Secondary | ICD-10-CM | POA: Diagnosis not present

## 2014-09-24 ENCOUNTER — Ambulatory Visit (INDEPENDENT_AMBULATORY_CARE_PROVIDER_SITE_OTHER): Payer: Medicare Other | Admitting: Psychology

## 2014-09-24 DIAGNOSIS — F4323 Adjustment disorder with mixed anxiety and depressed mood: Secondary | ICD-10-CM | POA: Diagnosis not present

## 2014-09-30 ENCOUNTER — Other Ambulatory Visit: Payer: Self-pay | Admitting: *Deleted

## 2014-09-30 ENCOUNTER — Other Ambulatory Visit: Payer: Self-pay | Admitting: Internal Medicine

## 2014-09-30 DIAGNOSIS — M9901 Segmental and somatic dysfunction of cervical region: Secondary | ICD-10-CM | POA: Diagnosis not present

## 2014-09-30 DIAGNOSIS — M5414 Radiculopathy, thoracic region: Secondary | ICD-10-CM | POA: Diagnosis not present

## 2014-09-30 DIAGNOSIS — M9902 Segmental and somatic dysfunction of thoracic region: Secondary | ICD-10-CM | POA: Diagnosis not present

## 2014-09-30 DIAGNOSIS — M5033 Other cervical disc degeneration, cervicothoracic region: Secondary | ICD-10-CM | POA: Diagnosis not present

## 2014-09-30 MED ORDER — BUPROPION HCL ER (XL) 150 MG PO TB24: ORAL_TABLET | ORAL | Status: DC

## 2014-10-08 ENCOUNTER — Ambulatory Visit (INDEPENDENT_AMBULATORY_CARE_PROVIDER_SITE_OTHER): Payer: Medicare Other | Admitting: Psychology

## 2014-10-08 DIAGNOSIS — F4323 Adjustment disorder with mixed anxiety and depressed mood: Secondary | ICD-10-CM

## 2014-10-21 ENCOUNTER — Ambulatory Visit (INDEPENDENT_AMBULATORY_CARE_PROVIDER_SITE_OTHER): Payer: Medicare Other | Admitting: Pulmonary Disease

## 2014-10-21 ENCOUNTER — Encounter: Payer: Self-pay | Admitting: Pulmonary Disease

## 2014-10-21 VITALS — BP 136/74 | HR 65 | Ht 65.0 in | Wt 171.0 lb

## 2014-10-21 DIAGNOSIS — J449 Chronic obstructive pulmonary disease, unspecified: Secondary | ICD-10-CM

## 2014-10-21 DIAGNOSIS — Z720 Tobacco use: Secondary | ICD-10-CM | POA: Diagnosis not present

## 2014-10-21 DIAGNOSIS — I2510 Atherosclerotic heart disease of native coronary artery without angina pectoris: Secondary | ICD-10-CM | POA: Diagnosis not present

## 2014-10-21 NOTE — Patient Instructions (Signed)
Keep taking your medications as you are doing QUIT SMOKING We will see you back in 6 months or sooner if needed

## 2014-10-21 NOTE — Progress Notes (Signed)
Subjective:    Patient ID: Jodi Beasley, female    DOB: 11/09/1946, 68 y.o.   MRN: [id number]  Synopsis: Jodi Beasley established care with the [facility] pulmonary clinic in May 2013 for COPD. She simple spirometry at that time with a ratio of 71%, the flow volume loop consistent with obstruction and an FEV1 of 1.92 L or 77% predicted. She was followed previously by Dr. Efraim Kaufmann, a pulmonologist in the [place], [place] area. She had been treated for pneumonia 3 weeks prior to her initial visit.  She has smoked one half pack of cigarettes per day for 53 years and continues to smoke.  HPI  Chief Complaint  Patient presents with  . Follow-up    Pt has no breathing complaints at this time.  CAT score 10.    10/21/2014 ROV > Jodi Beasley is still struggling with grief after her husband's death.  She feels like her lungs are doing OK.  She isn't having too much trouble with breathing.    Past Medical History  Diagnosis Date  . Depression   . Chicken pox   . GERD (gastroesophageal reflux disease)   . Hay fever   . History of blood transfusion   . Pneumonia, organism unspecified   . Hypothyroid   . COPD (chronic obstructive pulmonary disease)   . History of syncope 2000    negative EP study  . History of hypercholesterolemia   . Arrhythmia   . Hyperlipidemia     intolerance to statins except Crestor.   . Coronary artery disease     NSTEMI in 09/2008. Cath : 80% RCA and 95% first diagonal. PCI and 2 DES placement  RCA and 1 DES to diagonal. Complicated by acute diagonal stent thrombosis . Treated by thrombectomy. Most recent nuclear stress test in 2010 showed no ischemia with normal EF.   Marland Kitchen Hypertension   . MI (myocardial infarction)     Review of Systems  Constitutional: Negative for fever, fatigue and unexpected weight change.  HENT: Negative for congestion, postnasal drip, rhinorrhea and sinus pressure.   Respiratory: Negative for cough, choking and shortness of breath.    Cardiovascular: Negative for chest pain and leg swelling.       Objective:   Physical Exam  Filed Vitals:   10/21/14 1519  BP: 136/74  Pulse: 65  Height: 5\' 5"  (1.651 m)  Weight: 171 lb (77.565 kg)  SpO2: 96%   room air  Gen: well appearing, no acute distress HEENT: NCAT, EOMi, OP clear,  PULM: Clear to auscultation bilaterally, good air movement CV: RRR, no mgr, no JVD AB: BS+, soft, nontender, no hsm Ext: warm, no edema, no clubbing, no cyanosis   04/09/2012 Simple Spirometry: Ratio 71%, FEV1 1.92L (77% pred); flow volume loop consistent with obstruction     Assessment & Plan:   COPD (chronic obstructive pulmonary disease) with chronic bronchitis This has been a stable interval for Jodi Beasley but unfortunately she is still smoking.  Plan: -Continue inhaled therapies as written -Continue regular exercise -Educated at length to quit -Followup 6 months  Tobacco abuse Today we discussed low-dose cancer screening as well as strategies for quitting smoking. She is currently taking Wellbutrin. I do not recommend Chantix considering the significant grief she is experiencing from the loss of her husband.  We discussed the high false positive rate with low-dose lung cancer screening. She has decided to put this off until April of 2016 which is the one-year anniversary of her husband's death. She states  at that point she will have the test. She "doesn't need anymore on her plate".    Updated Medication List Outpatient Encounter Prescriptions as of 10/21/2014  Medication Sig  . albuterol (PROAIR HFA) 108 (90 BASE) MCG/ACT inhaler Inhale 2 puffs into the lungs every 6 (six) hours as needed.  Marland Kitchen aspirin 81 MG tablet Take 81 mg by mouth daily.  Raelyn Ensign Pollen 1000 MG TABS Take 1 tablet by mouth daily.  . budesonide-formoterol (SYMBICORT) 160-4.5 MCG/ACT inhaler Inhale 2 puffs into the lungs 2 (two) times daily.  Marland Kitchen buPROPion (WELLBUTRIN XL) 150 MG 24 hr tablet TAKE 1 TABLET BY MOUTH  DAILY.  . carisoprodol (SOMA) 350 MG tablet TAKE 1 TABLET BY MOUTH TWICE A DAY AS NEEDED FOR MUSCLE SPASM  . Cholecalciferol (VITAMIN D-3) 1000 UNITS CAPS Take 1 capsule by mouth daily.  . citalopram (CELEXA) 40 MG tablet Take 1 tablet (40 mg total) by mouth daily.  . CRESTOR 40 MG tablet TAKE 1 TABLET BY MOUTH EVERY DAY  . diphenhydrAMINE (BENADRYL) 25 MG tablet Take 25 mg by mouth every 6 (six) hours as needed.  . fluticasone (FLONASE) 50 MCG/ACT nasal spray USE 2 SPRAYS IN EACH NOSTRIL EVERY DAY (Patient taking differently: USE 2 SPRAYS IN EACH NOSTRIL EVERY DAY as needed)  . levothyroxine (SYNTHROID, LEVOTHROID) 112 MCG tablet Take 1 tablet (112 mcg total) by mouth daily before breakfast.  . metoprolol (LOPRESSOR) 100 MG tablet TAKE 1 TABLET (100 MG TOTAL) BY MOUTH 2 (TWO) TIMES DAILY.  . Multiple Vitamin (MULTIVITAMIN) capsule Take 1 capsule by mouth daily.  . nitroGLYCERIN (NITROSTAT) 0.4 MG SL tablet Place 1 tablet (0.4 mg total) under the tongue every 5 (five) minutes as needed.  . Omega-3 Fatty Acids (FISH OIL) 1200 MG CAPS Take 1 capsule by mouth daily.  Marland Kitchen omeprazole (PRILOSEC) 20 MG capsule TAKE ONE CAPSULE BY MOUTH EVERY DAY  . tiotropium (SPIRIVA) 18 MCG inhalation capsule Place 1 capsule (18 mcg total) into inhaler and inhale daily.  Marland Kitchen UNABLE TO FIND Med Name: Sinus and Allergy relief PE Per box directions as needed

## 2014-10-21 NOTE — Assessment & Plan Note (Signed)
Today we discussed low-dose cancer screening as well as strategies for quitting smoking. She is currently taking Wellbutrin. I do not recommend Chantix considering the significant grief she is experiencing from the loss of her husband.  We discussed the high false positive rate with low-dose lung cancer screening. She has decided to put this off until April of 2016 which is the one-year anniversary of her husband's death. She states at that point she will have the test. She "doesn't need anymore on her plate".

## 2014-10-21 NOTE — Assessment & Plan Note (Signed)
This has been a stable interval for Jodi Beasley but unfortunately she is still smoking.  Plan: -Continue inhaled therapies as written -Continue regular exercise -Educated at length to quit -Followup 6 months

## 2014-10-27 ENCOUNTER — Telehealth: Payer: Self-pay | Admitting: *Deleted

## 2014-10-27 ENCOUNTER — Other Ambulatory Visit: Payer: Self-pay | Admitting: *Deleted

## 2014-10-27 MED ORDER — METOPROLOL TARTRATE 100 MG PO TABS: ORAL_TABLET | ORAL | Status: DC

## 2014-10-27 MED ORDER — ALBUTEROL SULFATE HFA 108 (90 BASE) MCG/ACT IN AERS: 2.0000 | INHALATION_SPRAY | Freq: Four times a day (QID) | RESPIRATORY_TRACT | Status: DC | PRN

## 2014-10-27 NOTE — Telephone Encounter (Signed)
Fine to refill 

## 2014-10-27 NOTE — Telephone Encounter (Addendum)
Call from pharmacist requesting Soma refill.  Last refill 10.11.15.  Advise refill

## 2014-10-28 ENCOUNTER — Ambulatory Visit (INDEPENDENT_AMBULATORY_CARE_PROVIDER_SITE_OTHER): Payer: Medicare Other | Admitting: Psychology

## 2014-10-28 DIAGNOSIS — F4323 Adjustment disorder with mixed anxiety and depressed mood: Secondary | ICD-10-CM

## 2014-10-28 MED ORDER — CARISOPRODOL 350 MG PO TABS: ORAL_TABLET | ORAL | Status: DC

## 2014-10-28 NOTE — Telephone Encounter (Signed)
rx faxed

## 2014-11-04 DIAGNOSIS — M9902 Segmental and somatic dysfunction of thoracic region: Secondary | ICD-10-CM | POA: Diagnosis not present

## 2014-11-04 DIAGNOSIS — M9901 Segmental and somatic dysfunction of cervical region: Secondary | ICD-10-CM | POA: Diagnosis not present

## 2014-11-04 DIAGNOSIS — M5033 Other cervical disc degeneration, cervicothoracic region: Secondary | ICD-10-CM | POA: Diagnosis not present

## 2014-11-04 DIAGNOSIS — M5414 Radiculopathy, thoracic region: Secondary | ICD-10-CM | POA: Diagnosis not present

## 2014-11-10 ENCOUNTER — Encounter: Payer: Self-pay | Admitting: Cardiovascular Disease

## 2014-11-10 ENCOUNTER — Ambulatory Visit (INDEPENDENT_AMBULATORY_CARE_PROVIDER_SITE_OTHER): Payer: Medicare Other | Admitting: Cardiovascular Disease

## 2014-11-10 VITALS — BP 150/86 | HR 48 | Resp 16 | Ht 65.0 in | Wt 168.8 lb

## 2014-11-10 DIAGNOSIS — I10 Essential (primary) hypertension: Secondary | ICD-10-CM

## 2014-11-10 DIAGNOSIS — I2510 Atherosclerotic heart disease of native coronary artery without angina pectoris: Secondary | ICD-10-CM | POA: Diagnosis not present

## 2014-11-10 DIAGNOSIS — J441 Chronic obstructive pulmonary disease with (acute) exacerbation: Secondary | ICD-10-CM

## 2014-11-10 DIAGNOSIS — Z720 Tobacco use: Secondary | ICD-10-CM

## 2014-11-10 DIAGNOSIS — I499 Cardiac arrhythmia, unspecified: Secondary | ICD-10-CM

## 2014-11-10 MED ORDER — HYDROCODONE-HOMATROPINE 5-1.5 MG/5ML PO SYRP: 5.0000 mL | ORAL_SOLUTION | Freq: Four times a day (QID) | ORAL | Status: DC | PRN

## 2014-11-10 NOTE — Progress Notes (Signed)
Patient ID: Jodi Beasley, female   DOB: Mar 14, 1946, 68 y.o.   MRN: [id number]       Reason for office visit CAD  Jodi Beasley is still in mourning after losing her long-term companion Jodi Beasley in April. She is very appreciative of the care she has received from her therapist.   She has not had problems with angina pectoris. She has no cardiac complaints. She is using a vape device in an effort to quit smoking, but she still smokes roughly half a pack of cigarettes daily (he tells me that she has "cut back" but this is the same amount that she told me she smoked earlier this year). She has set a goal for quitting completely in January.  She is now 5 or 6 days into a stubborn upper respiratory infection with a persistent exhausting cough productive of scanty sputum. She has increased wheezing despite using her inhalers. She has not had fever or shaking chills. She does not have any pleurisy area  Her echocardiogram today shows an irregular rhythm. She has sinus rhythm with pauses every 3 or 4 beats. These are almost perfectly compensatory pauses suggesting she may have underlying unrecognized junctional premature beats. Alternatively she may have sinoatrial block second degree Mobitz type I. She is bradycardic but completely asymptomatic from this point of view.  She has a remote history of coronary problems, previously received her care in [place]. She had a small non-ST segment elevation myocardial infarction and October of 2009 and underwent placement of 2 drug-eluting stents: to the right coronary artery into the diagonal artery respectively. Immediately following this procedure she had lateral ST segment elevation secondary to acute diagonal stent thrombosis. This was treated with thrombectomy and balloon angioplasty. In September of 2014 she had chest pain for the first time in a very long time. A followup nuclear vasodilator stress test was normal. She has normal left ventricular systolic  function.  She takes a statin, beta blocker and aspirin. She has sciatica which limits her ability to exercise.  She has clear-cut symptoms and physical findings of emphysema and sees Dr. Simonne Maffucci. Her FEV1 was roughly 75% of predicted. She has smoked roughly 1/2 pack of cigarettes a day for over 50 years. She tells me that she has tried to quit smoking many many times. Once she was able to quit for a whole year.    Allergies  Allergen Reactions  . Benzocaine     Tongue swelling  . Darvon [Propoxyphene Hcl]     HA  . Monosodium Glutamate Diarrhea  . Neosporin [Neomycin-Bacitracin Zn-Polymyx]     blisters  . Nicoderm [Nicotine]     arrythmia    Current Outpatient Prescriptions  Medication Sig Dispense Refill  . albuterol (PROAIR HFA) 108 (90 BASE) MCG/ACT inhaler Inhale 2 puffs into the lungs every 6 (six) hours as needed. 18 g 6  . aspirin 81 MG tablet Take 81 mg by mouth daily.    Raelyn Ensign Pollen 1000 MG TABS Take 1 tablet by mouth daily.    . budesonide-formoterol (SYMBICORT) 160-4.5 MCG/ACT inhaler Inhale 2 puffs into the lungs 2 (two) times daily. 3 Inhaler 3  . buPROPion (WELLBUTRIN XL) 150 MG 24 hr tablet TAKE 1 TABLET BY MOUTH DAILY. 30 tablet 2  . carisoprodol (SOMA) 350 MG tablet TAKE 1 TABLET BY MOUTH TWICE A DAY AS NEEDED FOR MUSCLE SPASM 60 tablet 1  . Cholecalciferol (VITAMIN D-3) 1000 UNITS CAPS Take 1 capsule by mouth daily.    Marland Kitchen  citalopram (CELEXA) 40 MG tablet Take 1 tablet (40 mg total) by mouth daily. 30 tablet 6  . CRESTOR 40 MG tablet TAKE 1 TABLET BY MOUTH EVERY DAY 30 tablet 5  . diphenhydrAMINE (BENADRYL) 25 MG tablet Take 25 mg by mouth every 6 (six) hours as needed.    . fluticasone (FLONASE) 50 MCG/ACT nasal spray USE 2 SPRAYS IN EACH NOSTRIL EVERY DAY (Patient taking differently: USE 2 SPRAYS IN EACH NOSTRIL EVERY DAY as needed) 16 g 5  . levothyroxine (SYNTHROID, LEVOTHROID) 112 MCG tablet Take 1 tablet (112 mcg total) by mouth daily before  breakfast. 30 tablet 11  . metoprolol (LOPRESSOR) 100 MG tablet TAKE 1 TABLET (100 MG TOTAL) BY MOUTH 2 (TWO) TIMES DAILY. 180 tablet 1  . Multiple Vitamin (MULTIVITAMIN) capsule Take 1 capsule by mouth daily.    . nitroGLYCERIN (NITROSTAT) 0.4 MG SL tablet Place 1 tablet (0.4 mg total) under the tongue every 5 (five) minutes as needed. 25 tablet 3  . Omega-3 Fatty Acids (FISH OIL) 1200 MG CAPS Take 1 capsule by mouth daily.    Marland Kitchen omeprazole (PRILOSEC) 20 MG capsule TAKE ONE CAPSULE BY MOUTH EVERY DAY 90 capsule 1  . tiotropium (SPIRIVA) 18 MCG inhalation capsule Place 1 capsule (18 mcg total) into inhaler and inhale daily. 90 capsule 3  . UNABLE TO FIND Med Name: Sinus and Allergy relief PE Per box directions as needed    . HYDROcodone-homatropine (HYCODAN) 5-1.5 MG/5ML syrup Take 5 mLs by mouth every 6 (six) hours as needed for cough. 120 mL 0   No current facility-administered medications for this visit.    Past Medical History  Diagnosis Date  . Depression   . Chicken pox   . GERD (gastroesophageal reflux disease)   . Hay fever   . History of blood transfusion   . Pneumonia, organism unspecified   . Hypothyroid   . COPD (chronic obstructive pulmonary disease)   . History of syncope 2000    negative EP study  . History of hypercholesterolemia   . Arrhythmia   . Hyperlipidemia     intolerance to statins except Crestor.   . Coronary artery disease     NSTEMI in 09/2008. Cath : 80% RCA and 95% first diagonal. PCI and 2 DES placement  RCA and 1 DES to diagonal. Complicated by acute diagonal stent thrombosis . Treated by thrombectomy. Most recent nuclear stress test in 2010 showed no ischemia with normal EF.   Marland Kitchen Hypertension   . MI (myocardial infarction)     Past Surgical History  Procedure Laterality Date  . Tubal ligation    . Tonsillectomy    . Coronary angioplasty with stent placement  2009    [facility], drug-eluting mid RCA,prox diag;    Family History  Problem  Relation Age of Onset  . Colon cancer Father   . Lung cancer Father     was a former smoker  . Cancer Father     lung  . Hypertension    . Diabetes    . Hypertension Mother     History   Social History  . Marital Status: Widowed    Spouse Name: N/A    Number of Children: N/A  . Years of Education: N/A   Occupational History  . Not on file.   Social History Main Topics  . Smoking status: Current Every Day Smoker -- 1.00 packs/day for 53 years    Types: Cigarettes    Start date: 11/19/1960  .  Smokeless tobacco: Never Used  . Alcohol Use: 0.6 oz/week    1 Not specified per week  . Drug Use: No  . Sexual Activity: Not on file   Other Topics Concern  . Not on file   Social History Narrative   Lives with partner in [place]. 2 cats 2 dogs in home.   Recently moved from [facility].   Custody of 38month old.    Review of systems: Persistent unrelenting cough, not productive of sputum or hemoptysis. No fever or chills. The patient specifically denies any chest pain at rest or with exertion, dyspnea at rest or with exertion, orthopnea, paroxysmal nocturnal dyspnea, syncope, palpitations, focal neurological deficits, intermittent claudication, lower extremity edema, unexplained weight gain, hemoptysis.  The patient also denies abdominal pain, nausea, vomiting, dysphagia, diarrhea, constipation, polyuria, polydipsia, dysuria, hematuria, frequency, urgency, abnormal bleeding or bruising, fever, chills, unexpected weight changes, mood swings, change in skin or hair texture, change in voice quality, auditory or visual problems, allergic reactions or rashes, new musculoskeletal complaints other than usual "aches and pains".   PHYSICAL EXAM BP 150/86 mmHg  Pulse 48  Resp 16  Ht 5\' 5"  (1.651 m)  Wt 168 lb 12.8 oz (76.567 kg)  BMI 28.09 kg/m2  General: Alert, oriented x3, no distress Head: no evidence of trauma, PERRL, EOMI, no exophtalmos or lid lag, no myxedema, no xanthelasma;  normal ears, nose and oropharynx Neck: normal jugular venous pulsations and no hepatojugular reflux; brisk carotid pulses without delay and no carotid bruits Chest: clear to auscultation, no signs of consolidation by percussion or palpation, normal fremitus, symmetrical and full respiratory excursions Cardiovascular: normal position and quality of the apical impulse, regular rhythm, normal first and second heart sounds, no murmurs, rubs or gallops Abdomen: no tenderness or distention, no masses by palpation, no abnormal pulsatility or arterial bruits, normal bowel sounds, no hepatosplenomegaly Extremities: no clubbing, cyanosis or edema; 2+ radial, ulnar and brachial pulses bilaterally; 2+ right femoral, posterior tibial and dorsalis pedis pulses; 2+ left femoral, posterior tibial and dorsalis pedis pulses; no subclavian or femoral bruits Neurological: grossly nonfocal   EKG: Normal sinus rhythm with pauses. These may be due to second-degree Mobitz type I sinoatrial block or blocked junctional beats  Lipid Panel     Component Value Date/Time   CHOL 166 03/24/2014 1015   TRIG 201.0* 03/24/2014 1015   HDL 51.30 03/24/2014 1015   CHOLHDL 3 03/24/2014 1015   VLDL 40.2* 03/24/2014 1015   LDLCALC 75 03/24/2014 1015   LDLDIRECT 89.1 07/10/2012 0924    BMET    Component Value Date/Time   NA 137 03/24/2014 1015   K 4.4 03/24/2014 1015   CL 102 03/24/2014 1015   CO2 26 03/24/2014 1015   GLUCOSE 101* 03/24/2014 1015   BUN 9 03/24/2014 1015   CREATININE 0.8 03/24/2014 1015   CALCIUM 9.3 03/24/2014 1015     ASSESSMENT AND PLAN Acute Exacerbation of COPD Sounds like she has a viral syndrome rather than a bacterial infection. I gave her prescription for cough suppressant. She cannot take benzonatate due to allergy. If she does not get better in another 2-3 days she may need antibiotics for possible bacterial infection.  Coronary artery disease Currently asymptomatic. Her coronary risk  factors are generally well addressed with the exception of ongoing tobacco abuse. He has set a target for smoking cessation in January.   Hypertension Well-controlled on beta blocker monotherapy in the recent past. Slight increase in blood pressure may be related  to her intercurrent illness.  Other and unspecified hyperlipidemia Her LDL cholesterol is excellent when compared to her baseline. There has been a greater than 50% reduction. She still has mild residual hypertriglyceridemia. She prefers not to add another medication.  Sinoatrial block versus blocked junctional premature beats She states that she feels much better when she takes her beta blocker rather than when she misses it and has no symptoms attributable to bradycardia. Continue beta blocker.  Patient Instructions  A prescription has been sent to your pharmacy for cough syrup.  If your cough does not resolve after taking this medication please contact your primary care physician.  Dr. Sallyanne Kuster recommends that you schedule a follow-up appointment in: 6 months.       Orders Placed This Encounter  Procedures  . EKG 12-Lead   Meds ordered this encounter  Medications  . HYDROcodone-homatropine (HYCODAN) 5-1.5 MG/5ML syrup    Sig: Take 5 mLs by mouth every 6 (six) hours as needed for cough.    Dispense:  120 mL    Refill:  0    Artice Holohan  Sanda Klein, MD, [facility] [phone] office [phone] pager

## 2014-11-10 NOTE — Patient Instructions (Signed)
A prescription has been sent to your pharmacy for cough syrup.  If your cough does not resolve after taking this medication please contact your primary care physician.  Dr. Sallyanne Kuster recommends that you schedule a follow-up appointment in: 6 months.

## 2014-11-11 DIAGNOSIS — J441 Chronic obstructive pulmonary disease with (acute) exacerbation: Secondary | ICD-10-CM | POA: Insufficient documentation

## 2014-11-11 DIAGNOSIS — M9902 Segmental and somatic dysfunction of thoracic region: Secondary | ICD-10-CM | POA: Diagnosis not present

## 2014-11-11 DIAGNOSIS — M5033 Other cervical disc degeneration, cervicothoracic region: Secondary | ICD-10-CM | POA: Diagnosis not present

## 2014-11-11 DIAGNOSIS — M5414 Radiculopathy, thoracic region: Secondary | ICD-10-CM | POA: Diagnosis not present

## 2014-11-11 DIAGNOSIS — M9901 Segmental and somatic dysfunction of cervical region: Secondary | ICD-10-CM | POA: Diagnosis not present

## 2014-11-25 ENCOUNTER — Ambulatory Visit (INDEPENDENT_AMBULATORY_CARE_PROVIDER_SITE_OTHER): Payer: Medicare Other | Admitting: Internal Medicine

## 2014-11-25 ENCOUNTER — Encounter: Payer: Self-pay | Admitting: Internal Medicine

## 2014-11-25 VITALS — BP 115/65 | HR 48 | Temp 98.1°F | Ht 64.0 in | Wt 171.0 lb

## 2014-11-25 DIAGNOSIS — Z0000 Encounter for general adult medical examination without abnormal findings: Secondary | ICD-10-CM

## 2014-11-25 DIAGNOSIS — E039 Hypothyroidism, unspecified: Secondary | ICD-10-CM | POA: Diagnosis not present

## 2014-11-25 DIAGNOSIS — E784 Other hyperlipidemia: Secondary | ICD-10-CM | POA: Diagnosis not present

## 2014-11-25 LAB — CBC WITH DIFFERENTIAL/PLATELET
BASOS ABS: 0.1 10*3/uL (ref 0.0–0.1)
Basophils Relative: 0.6 % (ref 0.0–3.0)
EOS ABS: 0.2 10*3/uL (ref 0.0–0.7)
Eosinophils Relative: 1.8 % (ref 0.0–5.0)
HEMATOCRIT: 42.9 % (ref 36.0–46.0)
HEMOGLOBIN: 14.2 g/dL (ref 12.0–15.0)
LYMPHS ABS: 2.3 10*3/uL (ref 0.7–4.0)
Lymphocytes Relative: 27 % (ref 12.0–46.0)
MCHC: 33.2 g/dL (ref 30.0–36.0)
MCV: 92 fl (ref 78.0–100.0)
Monocytes Absolute: 0.9 10*3/uL (ref 0.1–1.0)
Monocytes Relative: 10.6 % (ref 3.0–12.0)
NEUTROS ABS: 5.2 10*3/uL (ref 1.4–7.7)
Neutrophils Relative %: 60 % (ref 43.0–77.0)
Platelets: 287 10*3/uL (ref 150.0–400.0)
RBC: 4.66 Mil/uL (ref 3.87–5.11)
RDW: 14.4 % (ref 11.5–15.5)
WBC: 8.6 10*3/uL (ref 4.0–10.5)

## 2014-11-25 LAB — LIPID PANEL
Cholesterol: 151 mg/dL (ref 0–200)
HDL: 43.9 mg/dL (ref >39.00)
NONHDL: 107.1
Total CHOL/HDL Ratio: 3
Triglycerides: 211 mg/dL — ABNORMAL HIGH (ref 0.0–149.0)
VLDL: 42.2 mg/dL — ABNORMAL HIGH (ref 0.0–40.0)

## 2014-11-25 LAB — MICROALBUMIN / CREATININE URINE RATIO
CREATININE, U: 50.1 mg/dL
Microalb Creat Ratio: 0.4 mg/g (ref 0.0–30.0)
Microalb, Ur: 0.2 mg/dL (ref 0.0–1.9)

## 2014-11-25 LAB — COMPREHENSIVE METABOLIC PANEL
ALT: 22 U/L (ref 0–35)
AST: 24 U/L (ref 0–37)
Albumin: 4 g/dL (ref 3.5–5.2)
Alkaline Phosphatase: 58 U/L (ref 39–117)
BILIRUBIN TOTAL: 0.6 mg/dL (ref 0.2–1.2)
BUN: 12 mg/dL (ref 6–23)
CO2: 25 mEq/L (ref 19–32)
Calcium: 9.1 mg/dL (ref 8.4–10.5)
Chloride: 100 mEq/L (ref 96–112)
Creatinine, Ser: 0.9 mg/dL (ref 0.4–1.2)
GFR: 67.99 mL/min (ref >60.00)
Glucose, Bld: 79 mg/dL (ref 70–99)
Potassium: 4.5 mEq/L (ref 3.5–5.1)
Sodium: 132 mEq/L — ABNORMAL LOW (ref 135–145)
Total Protein: 6.5 g/dL (ref 6.0–8.3)

## 2014-11-25 LAB — TSH: TSH: 1.13 u[IU]/mL (ref 0.35–4.50)

## 2014-11-25 LAB — LDL CHOLESTEROL, DIRECT: Direct LDL: 77.1 mg/dL

## 2014-11-25 MED ORDER — AZELASTINE HCL 0.1 % NA SOLN: 1.0000 | Freq: Two times a day (BID) | NASAL | Status: DC

## 2014-11-25 NOTE — Assessment & Plan Note (Signed)
Will check TSH with labs. 

## 2014-11-25 NOTE — Patient Instructions (Signed)

## 2014-11-25 NOTE — Addendum Note (Signed)
Addended byChauncy Lean. on: 11/25/2014 11:29 AM   Modules accepted: Orders

## 2014-11-25 NOTE — Progress Notes (Signed)
Subjective:    Patient ID: Jodi Beasley, female    DOB: 22-May-1946, 68 y.o.   MRN: [id number]  HPI The patient is here for annual Medicare wellness examination and management of other chronic and acute problems.   The risk factors are reflected in the social history.  The roster of all physicians providing medical care to patient - is listed in the Snapshot section of the chart.  Activities of daily living:  The patient is 100% independent in all ADLs: dressing, toileting, feeding as well as independent mobility. Lives in a condo with her mother. Husband died this year.  Home safety : The patient has smoke detectors in the home. They wear seatbelts.  There are no firearms at home. There is no violence in the home.   There is no risks for hepatitis, STDs or HIV. There is history of blood transfusion during cardiac stent placement. They have no travel history to infectious disease endemic areas of the world.  The patient has seen their dentist in the last six month. (Dentist - Dr. Eugenie Birks) They have seen their eye doctor in the last year. (Opthalmologist - Dr. Thomasene Ripple) They admit to slight hearing difficulty with regard to crowded rooms. They have deferred audiologic testing in the last year.  They do not  have excessive sun exposure. Discussed the need for sun protection: hats, long sleeves and use of sunscreen if there is significant sun exposure. (Dermatolgist - Dr. Nicole Kindred, planning to change to Dr. Kellie Moor) Cardiologist - Dr. Durwin Nora in [place], [facility]  Diet: the importance of a healthy diet is discussed. They do have a healthy diet.  The benefits of regular aerobic exercise were discussed. She walks several times per week.  Depression screen: there are no signs or vegative symptoms of depression- irritability, change in appetite, anhedonia, sadness/tearfullness. Continues to work with Dr. Rexene Edison after loss of her husband.  Cognitive assessment: the  patient manages all their financial and personal affairs and is actively engaged. They could relate day,date,year and events.   HCPOA - sister, Wonda Amis, and [name] Living Will in Place  The following portions of the patient's history were reviewed and updated as appropriate: allergies, current medications, past family history, past medical history,  past surgical history, past social history  and problem list.  Visual acuity was not assessed per patient preference since she has regular follow up with her ophthalmologist. Hearing and body mass index were assessed and reviewed.   During the course of the visit the patient was educated and counseled about appropriate screening and preventive services including : fall prevention , diabetes screening, nutrition counseling, colorectal cancer screening, and recommended immunizations.       Past medical, surgical, family and social history per today's encounter.  Review of Systems  Constitutional: Negative for fever, chills, appetite change, fatigue and unexpected weight change.  HENT: Positive for postnasal drip. Negative for rhinorrhea, sinus pressure, sore throat, trouble swallowing and voice change.   Eyes: Negative for visual disturbance.  Respiratory: Positive for cough (occasional dry cough). Negative for shortness of breath.   Cardiovascular: Negative for chest pain and leg swelling.  Gastrointestinal: Negative for nausea, vomiting, abdominal pain, diarrhea and constipation.  Musculoskeletal: Negative for myalgias and arthralgias.  Skin: Negative for color change and rash.  Hematological: Negative for adenopathy. Does not bruise/bleed easily.  Psychiatric/Behavioral: Negative for sleep disturbance and dysphoric mood. The patient is not nervous/anxious.        Objective:    BP 115/65  mmHg  Pulse 48  Temp(Src) 98.1 F (36.7 C) (Oral)  Ht 5\' 4"  (1.626 m)  Wt 171 lb (77.565 kg)  BMI 29.34 kg/m2  SpO2 98% Physical Exam    Constitutional: She is oriented to person, place, and time. She appears well-developed and well-nourished. No distress.  HENT:  Head: Normocephalic and atraumatic.  Right Ear: External ear normal.  Left Ear: External ear normal.  Nose: Nose normal.  Mouth/Throat: Oropharynx is clear and moist. No oropharyngeal exudate.  Eyes: Conjunctivae are normal. Pupils are equal, round, and reactive to light. Right eye exhibits no discharge. Left eye exhibits no discharge. No scleral icterus.  Neck: Normal range of motion. Neck supple. No tracheal deviation present. No thyromegaly present.  Cardiovascular: Normal rate, regular rhythm, normal heart sounds and intact distal pulses.  Exam reveals no gallop and no friction rub.   No murmur heard. Pulmonary/Chest: Effort normal and breath sounds normal. No accessory muscle usage. No tachypnea. No respiratory distress. She has no decreased breath sounds. She has no wheezes. She has no rales. She exhibits no tenderness. Right breast exhibits no inverted nipple, no mass, no nipple discharge, no skin change and no tenderness. Left breast exhibits no inverted nipple, no mass, no nipple discharge, no skin change and no tenderness. Breasts are symmetrical.  Abdominal: Soft. Bowel sounds are normal. She exhibits no distension and no mass. There is no tenderness. There is no rebound and no guarding.  Musculoskeletal: Normal range of motion. She exhibits no edema or tenderness.  Lymphadenopathy:    She has no cervical adenopathy.  Neurological: She is alert and oriented to person, place, and time. No cranial nerve deficit. She exhibits normal muscle tone. Coordination normal.  Skin: Skin is warm and dry. No rash noted. She is not diaphoretic. No erythema. No pallor.  Psychiatric: She has a normal mood and affect. Her behavior is normal. Judgment and thought content normal.          Assessment & Plan:   Problem List Items Addressed This Visit      Unprioritized    Hypothyroidism    Will check TSH with labs.    Relevant Orders      TSH   Medicare annual wellness visit, subsequent - Primary    General medical exam including breast exam normal today. PAP and pelvic deferred, as all previous PAPs normal and per pt preference. Mammogram ordered. Colonoscopy due, however pt prefers to put off repeat until next year. Encouraged healthy diet and regular exercise. Encouraged smoking cessation. Immunizations are UTD. Labs today including CBC, CMP, lipids, TSH.    Relevant Orders      CBC with Differential      Comprehensive metabolic panel      Lipid panel      Microalbumin / creatinine urine ratio       Return in about 6 months (around 05/27/2015) for Recheck.

## 2014-11-25 NOTE — Assessment & Plan Note (Signed)
General medical exam including breast exam normal today. PAP and pelvic deferred, as all previous PAPs normal and per pt preference. Mammogram ordered. Colonoscopy due, however pt prefers to put off repeat until next year. Encouraged healthy diet and regular exercise. Encouraged smoking cessation. Immunizations are UTD. Labs today including CBC, CMP, lipids, TSH.

## 2014-11-26 ENCOUNTER — Ambulatory Visit (INDEPENDENT_AMBULATORY_CARE_PROVIDER_SITE_OTHER): Payer: Medicare Other | Admitting: Psychology

## 2014-11-26 ENCOUNTER — Telehealth: Payer: Self-pay | Admitting: Internal Medicine

## 2014-11-26 DIAGNOSIS — F4323 Adjustment disorder with mixed anxiety and depressed mood: Secondary | ICD-10-CM

## 2014-11-26 NOTE — Telephone Encounter (Signed)
emmi emailed °

## 2014-12-09 ENCOUNTER — Other Ambulatory Visit: Payer: Self-pay | Admitting: *Deleted

## 2014-12-09 MED ORDER — TIOTROPIUM BROMIDE MONOHYDRATE 18 MCG IN CAPS: 18.0000 ug | ORAL_CAPSULE | Freq: Every day | RESPIRATORY_TRACT | Status: DC

## 2014-12-09 MED ORDER — ROSUVASTATIN CALCIUM 40 MG PO TABS: 40.0000 mg | ORAL_TABLET | Freq: Every day | ORAL | Status: DC

## 2014-12-09 MED ORDER — BUDESONIDE-FORMOTEROL FUMARATE 160-4.5 MCG/ACT IN AERO: 2.0000 | INHALATION_SPRAY | Freq: Two times a day (BID) | RESPIRATORY_TRACT | Status: DC

## 2014-12-15 DIAGNOSIS — M9902 Segmental and somatic dysfunction of thoracic region: Secondary | ICD-10-CM | POA: Diagnosis not present

## 2014-12-15 DIAGNOSIS — M9901 Segmental and somatic dysfunction of cervical region: Secondary | ICD-10-CM | POA: Diagnosis not present

## 2014-12-15 DIAGNOSIS — M5033 Other cervical disc degeneration, cervicothoracic region: Secondary | ICD-10-CM | POA: Diagnosis not present

## 2014-12-15 DIAGNOSIS — M5414 Radiculopathy, thoracic region: Secondary | ICD-10-CM | POA: Diagnosis not present

## 2014-12-26 ENCOUNTER — Other Ambulatory Visit: Payer: Self-pay | Admitting: Internal Medicine

## 2014-12-29 NOTE — Telephone Encounter (Signed)
Faxed to pharmacy

## 2014-12-29 NOTE — Telephone Encounter (Signed)
Last visit 11/25/14, refill?

## 2015-01-06 ENCOUNTER — Telehealth: Payer: Self-pay | Admitting: Internal Medicine

## 2015-01-06 DIAGNOSIS — M9902 Segmental and somatic dysfunction of thoracic region: Secondary | ICD-10-CM | POA: Diagnosis not present

## 2015-01-06 DIAGNOSIS — M9901 Segmental and somatic dysfunction of cervical region: Secondary | ICD-10-CM | POA: Diagnosis not present

## 2015-01-06 DIAGNOSIS — M5414 Radiculopathy, thoracic region: Secondary | ICD-10-CM | POA: Diagnosis not present

## 2015-01-06 DIAGNOSIS — M5033 Other cervical disc degeneration, cervicothoracic region: Secondary | ICD-10-CM | POA: Diagnosis not present

## 2015-01-06 NOTE — Telephone Encounter (Signed)
Pt dropped off paper work that needs to be resubmitted. Pt needs rx for spriva attached to the forms.msn

## 2015-01-07 ENCOUNTER — Other Ambulatory Visit: Payer: Self-pay | Admitting: *Deleted

## 2015-01-07 MED ORDER — TIOTROPIUM BROMIDE MONOHYDRATE 18 MCG IN CAPS: 18.0000 ug | ORAL_CAPSULE | Freq: Every day | RESPIRATORY_TRACT | Status: DC

## 2015-01-07 NOTE — Telephone Encounter (Signed)
Patient medication assistance program, [place], needs a copy of prescription sent to them

## 2015-01-08 NOTE — Telephone Encounter (Signed)
Completed and faxed.

## 2015-01-16 ENCOUNTER — Encounter: Payer: Self-pay | Admitting: Nurse Practitioner

## 2015-01-16 ENCOUNTER — Ambulatory Visit (INDEPENDENT_AMBULATORY_CARE_PROVIDER_SITE_OTHER): Payer: Medicare Other | Admitting: Nurse Practitioner

## 2015-01-16 VITALS — BP 110/60 | HR 60 | Temp 97.6°F | Resp 16 | Ht 64.0 in | Wt 172.0 lb

## 2015-01-16 DIAGNOSIS — M654 Radial styloid tenosynovitis [de Quervain]: Secondary | ICD-10-CM | POA: Diagnosis not present

## 2015-01-16 NOTE — Progress Notes (Signed)
Pre visit review using our clinic review tool, if applicable. No additional management support is needed unless otherwise documented below in the visit note. 

## 2015-01-16 NOTE — Patient Instructions (Signed)
De Quervain's Disease De Quervain's disease is a condition often seen in racquet sports where there is a soreness (inflammation) in the cord like structures (tendons) which attach muscle to bone on the thumb side of the wrist. There may be a tightening of the tissuesaround the tendons. This condition is often helped by giving up or modifying the activity which caused it. When conservative treatment does not help, surgery may be required. Conservative treatment could include changes in the activity which brought about the problem or made it worse. Anti-inflammatory medications and injections may be used to help decrease the inflammation and help with pain control. Your caregiver will help you determine which is best for you. DIAGNOSIS  Often the diagnosis (learning what is wrong) can be made by examination. Sometimes x-rays are required. HOME CARE INSTRUCTIONS   Apply ice to the sore area for 15-20 minutes, 03-04 times per day while awake. Put the ice in a plastic bag and place a towel between the bag of ice and your skin. This is especially helpful if it can be done after all activities involving the sore wrist.  Temporary splinting may help.  Only take over-the-counter or prescription medicines for pain, discomfort or fever as directed by your caregiver. SEEK MEDICAL CARE IF:   Pain relief is not obtained with medications, or if you have increasing pain and seem to be getting worse rather than better. MAKE SURE YOU:   Understand these instructions.  Will watch your condition.  Will get help right away if you are not doing well or get worse. Document Released: 08/16/2001 Document Revised: 02/13/2012 Document Reviewed: 03/26/2014 [employer] Patient Information 2015 [employer]. This information is not intended to replace advice given to you by your health care provider. Make sure you discuss any questions you have with your health care provider.  

## 2015-01-16 NOTE — Progress Notes (Signed)
Subjective:    Patient ID: Jodi Beasley, female    DOB: [birth year], 69 y.o.   MRN: [id number]  HPI  Jodi Beasley is a 69 yo female with a CC of right wrist pain x 1.5 weeks.   1) Right wrist pain flare x 1.5 weeks- Has been going on for a few years. Happened once about 3 years ago when father was ill and she was doing a lot of lifting and physical work taking care of him. He has passed and her mother is now ill and she is doing a lot of work with her.   Right wrist medial side is painful, thumb is painful, describes as achy.  Not splinting, currently using OTC Aspercreme with lidocaine in it she reports. Advil is somewhat helpful.    Review of Systems  Constitutional: Negative for fever, chills, diaphoresis and fatigue.  Respiratory: Negative for chest tightness, shortness of breath and wheezing.   Cardiovascular: Negative for chest pain, palpitations and leg swelling.  Gastrointestinal: Negative for nausea, vomiting and diarrhea.  Musculoskeletal: Positive for arthralgias.       Right wrist  Skin: Negative for rash.  Neurological: Negative for dizziness, weakness, numbness and headaches.  Psychiatric/Behavioral: The patient is not nervous/anxious.    Past Medical History  Diagnosis Date  . Depression   . Chicken pox   . GERD (gastroesophageal reflux disease)   . Hay fever   . History of blood transfusion   . Pneumonia, organism unspecified   . Hypothyroid   . COPD (chronic obstructive pulmonary disease)   . History of syncope 2000    negative EP study  . History of hypercholesterolemia   . Arrhythmia   . Hyperlipidemia     intolerance to statins except Crestor.   . Coronary artery disease     NSTEMI in 09/2008. Cath : 80% RCA and 95% first diagonal. PCI and 2 DES placement  RCA and 1 DES to diagonal. Complicated by acute diagonal stent thrombosis . Treated by thrombectomy. Most recent nuclear stress test in 2010 showed no ischemia with normal EF.   Marland Kitchen Hypertension   . MI  (myocardial infarction)     History   Social History  . Marital Status: Widowed    Spouse Name: N/A  . Number of Children: N/A  . Years of Education: N/A   Occupational History  . Not on file.   Social History Main Topics  . Smoking status: Current Every Day Smoker -- 1.00 packs/day for 53 years    Types: Cigarettes    Start date: 11/19/1960  . Smokeless tobacco: Never Used  . Alcohol Use: 0.6 oz/week    1 Standard drinks or equivalent per week  . Drug Use: No  . Sexual Activity: Not on file   Other Topics Concern  . Not on file   Social History Narrative   Lives with mother in [place]. 2 cats 2 dogs in home.   Recently moved from [facility].   Custody of 44month old.    Past Surgical History  Procedure Laterality Date  . Tubal ligation    . Tonsillectomy    . Coronary angioplasty with stent placement  2009    [facility], drug-eluting mid RCA,prox diag;    Family History  Problem Relation Age of Onset  . Colon cancer Father   . Lung cancer Father     was a former smoker  . Cancer Father     lung  . Hypertension    .  Diabetes    . Hypertension Mother     Allergies  Allergen Reactions  . Benzocaine     Tongue swelling  . Darvon [Propoxyphene Hcl]     HA  . Monosodium Glutamate Diarrhea  . Neosporin [Neomycin-Bacitracin Zn-Polymyx]     blisters  . Nicoderm [Nicotine]     arrythmia    Current Outpatient Prescriptions on File Prior to Visit  Medication Sig Dispense Refill  . albuterol (PROAIR HFA) 108 (90 BASE) MCG/ACT inhaler Inhale 2 puffs into the lungs every 6 (six) hours as needed. 18 g 6  . aspirin 81 MG tablet Take 81 mg by mouth daily.    Marland Kitchen azelastine (ASTELIN) 0.1 % nasal spray Place 1 spray into both nostrils 2 (two) times daily. Use in each nostril as directed 30 mL 12  . Bee Pollen 1000 MG TABS Take 1 tablet by mouth daily.    . budesonide-formoterol (SYMBICORT) 160-4.5 MCG/ACT inhaler Inhale 2 puffs into the lungs 2 (two) times daily. 3  Inhaler 3  . buPROPion (WELLBUTRIN XL) 150 MG 24 hr tablet TAKE ONE TABLET BY MOUTH ONE TIME DAILY  30 tablet 2  . carisoprodol (SOMA) 350 MG tablet TAKE ONE TABLET BY MOUTH TWICE DAILY AS NEEDED for muscle spasms 60 tablet 0  . Cholecalciferol (VITAMIN D-3) 1000 UNITS CAPS Take 1 capsule by mouth daily.    . citalopram (CELEXA) 40 MG tablet Take 1 tablet (40 mg total) by mouth daily. 30 tablet 6  . diphenhydrAMINE (BENADRYL) 25 MG tablet Take 25 mg by mouth every 6 (six) hours as needed.    . fluticasone (FLONASE) 50 MCG/ACT nasal spray USE 2 SPRAYS IN EACH NOSTRIL EVERY DAY (Patient taking differently: USE 2 SPRAYS IN EACH NOSTRIL EVERY DAY as needed) 16 g 5  . levothyroxine (SYNTHROID, LEVOTHROID) 112 MCG tablet Take 1 tablet (112 mcg total) by mouth daily before breakfast. 30 tablet 11  . metoprolol (LOPRESSOR) 100 MG tablet TAKE 1 TABLET (100 MG TOTAL) BY MOUTH 2 (TWO) TIMES DAILY. 180 tablet 1  . Multiple Vitamin (MULTIVITAMIN) capsule Take 1 capsule by mouth daily.    . nitroGLYCERIN (NITROSTAT) 0.4 MG SL tablet Place 1 tablet (0.4 mg total) under the tongue every 5 (five) minutes as needed. 25 tablet 3  . Omega-3 Fatty Acids (FISH OIL) 1200 MG CAPS Take 1 capsule by mouth daily.    Marland Kitchen omeprazole (PRILOSEC) 20 MG capsule TAKE ONE CAPSULE BY MOUTH EVERY DAY 90 capsule 1  . rosuvastatin (CRESTOR) 40 MG tablet Take 1 tablet (40 mg total) by mouth daily. 30 tablet 5  . tiotropium (SPIRIVA) 18 MCG inhalation capsule Place 1 capsule (18 mcg total) into inhaler and inhale daily. 90 capsule 3  . UNABLE TO FIND Med Name: Sinus and Allergy relief PE Per box directions as needed     No current facility-administered medications on file prior to visit.       Objective:   Physical Exam  Constitutional: She is oriented to person, place, and time. She appears well-developed and well-nourished. No distress.  BP 110/60 mmHg  Pulse 60  Temp(Src) 97.6 F (36.4 C) (Oral)  Resp 16  Ht 5\' 4"  (1.626 m)   Wt 172 lb (78.019 kg)  BMI 29.51 kg/m2  SpO2 95%   HENT:  Head: Normocephalic and atraumatic.  Right Ear: External ear normal.  Left Ear: External ear normal.  Eyes: Right eye exhibits no discharge. Left eye exhibits no discharge. No scleral icterus.  Cardiovascular: Normal  rate, regular rhythm, normal heart sounds and intact distal pulses.  Exam reveals no gallop and no friction rub.   No murmur heard. Pulmonary/Chest: Effort normal and breath sounds normal. No respiratory distress. She has no wheezes. She has no rales. She exhibits no tenderness.  Musculoskeletal: Normal range of motion. She exhibits tenderness. She exhibits no edema.  Positive Finkelstein on right. Thumb and radial side of wrist tenderness to palpation and ROM.   Neurological: She is alert and oriented to person, place, and time. No cranial nerve deficit. She exhibits normal muscle tone. Coordination normal.  Skin: Skin is warm and dry. No rash noted. She is not diaphoretic.  Psychiatric: She has a normal mood and affect. Her behavior is normal. Judgment and thought content normal.      Assessment & Plan:

## 2015-01-18 NOTE — Assessment & Plan Note (Signed)
Worsening. Gave pt handout with instructions on how to care for tendonitis. Rest, ice, immobilization, and NSAIDs. Pt wanted wrist brace today. Universal right wrist/forearm was given to her and charge under charge capture. FU prn worsening/failure to improve.

## 2015-01-19 ENCOUNTER — Telehealth: Payer: Self-pay | Admitting: Internal Medicine

## 2015-01-19 NOTE — Telephone Encounter (Signed)
emmi emailed °

## 2015-01-20 DIAGNOSIS — M9901 Segmental and somatic dysfunction of cervical region: Secondary | ICD-10-CM | POA: Diagnosis not present

## 2015-01-20 DIAGNOSIS — M5033 Other cervical disc degeneration, cervicothoracic region: Secondary | ICD-10-CM | POA: Diagnosis not present

## 2015-01-20 DIAGNOSIS — M9902 Segmental and somatic dysfunction of thoracic region: Secondary | ICD-10-CM | POA: Diagnosis not present

## 2015-01-20 DIAGNOSIS — M5414 Radiculopathy, thoracic region: Secondary | ICD-10-CM | POA: Diagnosis not present

## 2015-01-26 ENCOUNTER — Other Ambulatory Visit: Payer: Self-pay | Admitting: Internal Medicine

## 2015-01-26 ENCOUNTER — Telehealth: Payer: Self-pay

## 2015-01-26 NOTE — Telephone Encounter (Signed)
Rx faxed by [facility]

## 2015-01-26 NOTE — Telephone Encounter (Signed)
PA for carisoprodol has been denied by insurance. Pharmacy notified of decision.

## 2015-01-26 NOTE — Telephone Encounter (Signed)
Received PA request from [employer] for Carisoprodol Rx. PA completed on cover my meds. Awaiting a response at this time.

## 2015-01-26 NOTE — Telephone Encounter (Signed)
Okay to refill? Last seen in December

## 2015-02-03 ENCOUNTER — Ambulatory Visit (INDEPENDENT_AMBULATORY_CARE_PROVIDER_SITE_OTHER): Payer: Medicare Other | Admitting: Psychology

## 2015-02-03 DIAGNOSIS — F4323 Adjustment disorder with mixed anxiety and depressed mood: Secondary | ICD-10-CM

## 2015-02-11 DIAGNOSIS — M654 Radial styloid tenosynovitis [de Quervain]: Secondary | ICD-10-CM | POA: Diagnosis not present

## 2015-02-17 DIAGNOSIS — M9902 Segmental and somatic dysfunction of thoracic region: Secondary | ICD-10-CM | POA: Diagnosis not present

## 2015-02-17 DIAGNOSIS — M9901 Segmental and somatic dysfunction of cervical region: Secondary | ICD-10-CM | POA: Diagnosis not present

## 2015-02-17 DIAGNOSIS — M5414 Radiculopathy, thoracic region: Secondary | ICD-10-CM | POA: Diagnosis not present

## 2015-02-17 DIAGNOSIS — M5033 Other cervical disc degeneration, cervicothoracic region: Secondary | ICD-10-CM | POA: Diagnosis not present

## 2015-02-18 ENCOUNTER — Ambulatory Visit (INDEPENDENT_AMBULATORY_CARE_PROVIDER_SITE_OTHER): Payer: Medicare Other | Admitting: Psychology

## 2015-02-18 DIAGNOSIS — F4323 Adjustment disorder with mixed anxiety and depressed mood: Secondary | ICD-10-CM | POA: Diagnosis not present

## 2015-02-23 ENCOUNTER — Other Ambulatory Visit: Payer: Self-pay | Admitting: Internal Medicine

## 2015-03-07 ENCOUNTER — Other Ambulatory Visit: Payer: Self-pay | Admitting: Internal Medicine

## 2015-03-11 ENCOUNTER — Ambulatory Visit (INDEPENDENT_AMBULATORY_CARE_PROVIDER_SITE_OTHER): Payer: Medicare Other | Admitting: Psychology

## 2015-03-11 DIAGNOSIS — F4323 Adjustment disorder with mixed anxiety and depressed mood: Secondary | ICD-10-CM | POA: Diagnosis not present

## 2015-03-17 DIAGNOSIS — M5033 Other cervical disc degeneration, cervicothoracic region: Secondary | ICD-10-CM | POA: Diagnosis not present

## 2015-03-17 DIAGNOSIS — M5414 Radiculopathy, thoracic region: Secondary | ICD-10-CM | POA: Diagnosis not present

## 2015-03-17 DIAGNOSIS — M9902 Segmental and somatic dysfunction of thoracic region: Secondary | ICD-10-CM | POA: Diagnosis not present

## 2015-03-17 DIAGNOSIS — M9901 Segmental and somatic dysfunction of cervical region: Secondary | ICD-10-CM | POA: Diagnosis not present

## 2015-03-23 ENCOUNTER — Other Ambulatory Visit: Payer: Self-pay | Admitting: Internal Medicine

## 2015-03-27 ENCOUNTER — Telehealth: Payer: Self-pay

## 2015-03-27 ENCOUNTER — Other Ambulatory Visit: Payer: Self-pay

## 2015-03-27 MED ORDER — ROSUVASTATIN CALCIUM 40 MG PO TABS: 40.0000 mg | ORAL_TABLET | Freq: Every day | ORAL | Status: DC

## 2015-03-27 NOTE — Telephone Encounter (Signed)
Patient call the Triage line requesting that her Crestor Prescription be faxed to [facility] and me prescription program again as they are stating they didn't receive it.  Researched and found the information needed. Re faxed to [employer].

## 2015-03-31 ENCOUNTER — Other Ambulatory Visit: Payer: Self-pay | Admitting: Internal Medicine

## 2015-03-31 DIAGNOSIS — Z1231 Encounter for screening mammogram for malignant neoplasm of breast: Secondary | ICD-10-CM

## 2015-04-14 DIAGNOSIS — M5414 Radiculopathy, thoracic region: Secondary | ICD-10-CM | POA: Diagnosis not present

## 2015-04-14 DIAGNOSIS — M5033 Other cervical disc degeneration, cervicothoracic region: Secondary | ICD-10-CM | POA: Diagnosis not present

## 2015-04-14 DIAGNOSIS — M9901 Segmental and somatic dysfunction of cervical region: Secondary | ICD-10-CM | POA: Diagnosis not present

## 2015-04-14 DIAGNOSIS — M9902 Segmental and somatic dysfunction of thoracic region: Secondary | ICD-10-CM | POA: Diagnosis not present

## 2015-04-15 ENCOUNTER — Ambulatory Visit: Payer: Medicare Other

## 2015-04-24 ENCOUNTER — Other Ambulatory Visit: Payer: Self-pay | Admitting: Internal Medicine

## 2015-04-27 ENCOUNTER — Other Ambulatory Visit: Payer: Self-pay | Admitting: Internal Medicine

## 2015-05-06 ENCOUNTER — Ambulatory Visit (INDEPENDENT_AMBULATORY_CARE_PROVIDER_SITE_OTHER): Payer: Medicare Other | Admitting: Pulmonary Disease

## 2015-05-06 ENCOUNTER — Encounter: Payer: Self-pay | Admitting: Pulmonary Disease

## 2015-05-06 VITALS — BP 122/80 | HR 57 | Ht 64.0 in | Wt 171.2 lb

## 2015-05-06 DIAGNOSIS — J449 Chronic obstructive pulmonary disease, unspecified: Secondary | ICD-10-CM | POA: Diagnosis not present

## 2015-05-06 NOTE — Assessment & Plan Note (Signed)
This has been a stable interval for Jodi Beasley. Unfortunately she continues to smoke. She is not interested in quitting despite my recommendation today.  Plan: Continue Symbicort and Spiriva Follow-up 6 months

## 2015-05-06 NOTE — Patient Instructions (Signed)
Keep taking your medications as you're doing Quit smoking Follow-up in 6 months or sooner if needed

## 2015-05-06 NOTE — Progress Notes (Signed)
Subjective:    Patient ID: Jodi Beasley, female    DOB: 1945/12/13, 69 y.o.   MRN: [id number]  Synopsis: Ms. Noguera established care with the [facility] pulmonary clinic in May 2013 for COPD. She simple spirometry at that time with a ratio of 71%, the flow volume loop consistent with obstruction and an FEV1 of 1.92 L or 77% predicted. She was followed previously by Dr. Efraim Kaufmann, a pulmonologist in the [place], [place] area. She had been treated for pneumonia 3 weeks prior to her initial visit.  She has smoked one half pack of cigarettes per day for 53 years and continues to smoke.  HPI Chief Complaint  Patient presents with  . Follow-up    Pt had breathing challenges during pollen season and  on high humidity days. No other concerns.   Tiarrah has been doing well. Still taking spiriva and symbicort.  Taking bee based health supplement called "bee caps" which is apparently bee pollen.  She's not sure where it is made. However she says that she thinks it is really helping because she has not had any trouble since she saw me last.  She is still smoking about 3/4 pack per days.  She is not interested in taking a smoking cessation class. Seh does not want to quit smoking.   Past Medical History  Diagnosis Date  . Depression   . Chicken pox   . GERD (gastroesophageal reflux disease)   . Hay fever   . History of blood transfusion   . Pneumonia, organism unspecified   . Hypothyroid   . COPD (chronic obstructive pulmonary disease)   . History of syncope 2000    negative EP study  . History of hypercholesterolemia   . Arrhythmia   . Hyperlipidemia     intolerance to statins except Crestor.   . Coronary artery disease     NSTEMI in 09/2008. Cath : 80% RCA and 95% first diagonal. PCI and 2 DES placement  RCA and 1 DES to diagonal. Complicated by acute diagonal stent thrombosis . Treated by thrombectomy. Most recent nuclear stress test in 2010 showed no ischemia with normal EF.   Marland Kitchen  Hypertension   . MI (myocardial infarction)     Review of Systems  Constitutional: Negative for fever, fatigue and unexpected weight change.  HENT: Negative for congestion, postnasal drip, rhinorrhea and sinus pressure.   Respiratory: Negative for cough, choking and shortness of breath.   Cardiovascular: Negative for chest pain and leg swelling.       Objective:   Physical Exam Filed Vitals:   05/06/15 1009  BP: 122/80  Pulse: 57  Height: 5\' 4"  (1.626 m)  Weight: 171 lb 3.2 oz (77.656 kg)  SpO2: 96%   room air  Gen: well appearing, no acute distress HEENT: NCAT, EOMi, OP clear,  PULM: Clear to auscultation bilaterally, good air movement CV: RRR, no mgr, no JVD AB: BS+, soft, nontender, no hsm Ext: warm, no edema, no clubbing, no cyanosis   04/09/2012 Simple Spirometry: Ratio 71%, FEV1 1.92L (77% pred); flow volume loop consistent with obstruction     Assessment & Plan:   COPD (chronic obstructive pulmonary disease) with chronic bronchitis This has been a stable interval for [name]. Unfortunately she continues to smoke. She is not interested in quitting despite my recommendation today.  Plan: Continue Symbicort and Spiriva Follow-up 6 months     Updated Medication List Outpatient Encounter Prescriptions as of 05/06/2015  Medication Sig  . albuterol (PROAIR HFA)  108 (90 BASE) MCG/ACT inhaler Inhale 2 puffs into the lungs every 6 (six) hours as needed.  Marland Kitchen aspirin 81 MG tablet Take 81 mg by mouth daily.  Marland Kitchen azelastine (ASTELIN) 0.1 % nasal spray Place 1 spray into both nostrils 2 (two) times daily. Use in each nostril as directed  . Bee Pollen 1000 MG TABS Take 1 tablet by mouth daily.  . budesonide-formoterol (SYMBICORT) 160-4.5 MCG/ACT inhaler Inhale 2 puffs into the lungs 2 (two) times daily.  Marland Kitchen buPROPion (WELLBUTRIN XL) 150 MG 24 hr tablet TAKE ONE TABLET BY MOUTH ONE TIME DAILY  . carisoprodol (SOMA) 350 MG tablet TAKE ONE TABLET BY MOUTH TWICE DAILY AS NEEDED for  muscle spasms   . Cholecalciferol (VITAMIN D-3) 1000 UNITS CAPS Take 1 capsule by mouth daily.  . citalopram (CELEXA) 40 MG tablet TAKE ONE TABLET BY MOUTH ONE TIME DAILY  . Coenzyme Q10 (COQ10) 400 MG CAPS Take 400 mg by mouth daily.  . diphenhydrAMINE (BENADRYL) 25 MG tablet Take 25 mg by mouth every 6 (six) hours as needed.  . fluticasone (FLONASE) 50 MCG/ACT nasal spray USE TWO SPRAYS EACH NOSTRIL EVERY DAY   . levothyroxine (SYNTHROID, LEVOTHROID) 112 MCG tablet TAKE ONE TABLET BY MOUTH ONE TIME DAILY BEFORE BREAKFAST  . metoprolol (LOPRESSOR) 100 MG tablet TAKE ONE TABLET BY MOUTH TWICE DAILY  . Multiple Vitamin (MULTIVITAMIN) capsule Take 1 capsule by mouth daily.  . nitroGLYCERIN (NITROSTAT) 0.4 MG SL tablet Place 1 tablet (0.4 mg total) under the tongue every 5 (five) minutes as needed.  . Omega-3 Fatty Acids (FISH OIL) 1200 MG CAPS Take 1 capsule by mouth daily.  Marland Kitchen omeprazole (PRILOSEC) 20 MG capsule TAKE ONE CAPSULE BY MOUTH ONE TIME DAILY   . rosuvastatin (CRESTOR) 40 MG tablet Take 1 tablet (40 mg total) by mouth daily.  Marland Kitchen tiotropium (SPIRIVA) 18 MCG inhalation capsule Place 1 capsule (18 mcg total) into inhaler and inhale daily.  Marland Kitchen UNABLE TO FIND Med Name: Sinus and Allergy relief PE Per box directions as needed   No facility-administered encounter medications on file as of 05/06/2015.

## 2015-05-08 ENCOUNTER — Ambulatory Visit (INDEPENDENT_AMBULATORY_CARE_PROVIDER_SITE_OTHER): Payer: Medicare Other | Admitting: Cardiovascular Disease

## 2015-05-08 ENCOUNTER — Encounter: Payer: Self-pay | Admitting: Cardiovascular Disease

## 2015-05-08 VITALS — BP 142/86 | HR 54 | Resp 16 | Ht 65.0 in | Wt 171.0 lb

## 2015-05-08 DIAGNOSIS — I10 Essential (primary) hypertension: Secondary | ICD-10-CM | POA: Diagnosis not present

## 2015-05-08 DIAGNOSIS — Z720 Tobacco use: Secondary | ICD-10-CM | POA: Diagnosis not present

## 2015-05-08 DIAGNOSIS — I2510 Atherosclerotic heart disease of native coronary artery without angina pectoris: Secondary | ICD-10-CM | POA: Diagnosis not present

## 2015-05-08 DIAGNOSIS — J449 Chronic obstructive pulmonary disease, unspecified: Secondary | ICD-10-CM | POA: Diagnosis not present

## 2015-05-08 MED ORDER — METOPROLOL TARTRATE 50 MG PO TABS: 50.0000 mg | ORAL_TABLET | Freq: Two times a day (BID) | ORAL | Status: DC

## 2015-05-08 NOTE — Progress Notes (Signed)
Patient ID: Jodi Beasley, female   DOB: [birth year], 69 y.o.   MRN: [id number]     Cardiology Office Note   Date:  05/10/2015   ID:  Jodi Beasley, DOB [birth year], MRN [id number]  PCP:  Rica Mast, MD  Cardiologist:   Sanda Klein, MD   Chief Complaint  Patient presents with  . Follow-up    Patient feels light headed and dizzy at times.      History of Present Illness: Jodi Beasley is a 69 y.o. female who presents for follow up of coronary artery disease and risk factors. Unfortunately she continues to smoke. She also thinks she will never quit smoking, a 50 year habit for her. She is on appropriate high-dose statin therapy and has generally well controlled blood pressure. She also has smoking-related COPD and her pulmonologist, Dr. Lake Bells, has also been trying to convince her to quit smoking. Her only complaint is occasional dizziness. EKG today shows sinus bradycardia and what is probably Mobitz type I second-degree sinoatrial block. She is on a fairly high-dose of metoprolol.  She has a remote history of coronary problems, previously received her care in [place]. She had a small non-ST segment elevation myocardial infarction and October of 2009 and underwent placement of 2 drug-eluting stents: to the right coronary artery into the diagonal artery respectively. Immediately following this procedure she had lateral ST segment elevation secondary to acute diagonal stent thrombosis. This was treated with thrombectomy and balloon angioplasty. In September of 2014 she had chest pain for the first time in a very long time. A followup nuclear vasodilator stress test was normal. She has normal left ventricular systolic function.  She has sciatica which limits her ability to exercise.  Past Medical History  Diagnosis Date  . Depression   . Chicken pox   . GERD (gastroesophageal reflux disease)   . Hay fever   . History of blood transfusion   . Pneumonia, organism unspecified     . Hypothyroid   . COPD (chronic obstructive pulmonary disease)   . History of syncope 2000    negative EP study  . History of hypercholesterolemia   . Arrhythmia   . Hyperlipidemia     intolerance to statins except Crestor.   . Coronary artery disease     NSTEMI in 09/2008. Cath : 80% RCA and 95% first diagonal. PCI and 2 DES placement  RCA and 1 DES to diagonal. Complicated by acute diagonal stent thrombosis . Treated by thrombectomy. Most recent nuclear stress test in 2010 showed no ischemia with normal EF.   Marland Kitchen Hypertension   . MI (myocardial infarction)     Past Surgical History  Procedure Laterality Date  . Tubal ligation    . Tonsillectomy    . Coronary angioplasty with stent placement  2009    [facility], drug-eluting mid RCA,prox diag;     Current Outpatient Prescriptions  Medication Sig Dispense Refill  . albuterol (PROAIR HFA) 108 (90 BASE) MCG/ACT inhaler Inhale 2 puffs into the lungs every 6 (six) hours as needed. 18 g 6  . aspirin 81 MG tablet Take 81 mg by mouth daily.    Marland Kitchen azelastine (ASTELIN) 0.1 % nasal spray Place 1 spray into both nostrils 2 (two) times daily. Use in each nostril as directed 30 mL 12  . Bee Pollen 1000 MG TABS Take 1 tablet by mouth daily.    . budesonide-formoterol (SYMBICORT) 160-4.5 MCG/ACT inhaler Inhale 2 puffs into the lungs 2 (two) times daily. 3 Inhaler 3  .  buPROPion (WELLBUTRIN XL) 150 MG 24 hr tablet TAKE ONE TABLET BY MOUTH ONE TIME DAILY 30 tablet 2  . carisoprodol (SOMA) 350 MG tablet TAKE ONE TABLET BY MOUTH TWICE DAILY AS NEEDED for muscle spasms  60 tablet 3  . Cholecalciferol (VITAMIN D-3) 1000 UNITS CAPS Take 1 capsule by mouth daily.    . citalopram (CELEXA) 40 MG tablet TAKE ONE TABLET BY MOUTH ONE TIME DAILY 30 tablet 2  . Coenzyme Q10 (COQ10) 400 MG CAPS Take 400 mg by mouth daily.    . diphenhydrAMINE (BENADRYL) 25 MG tablet Take 25 mg by mouth every 6 (six) hours as needed.    . fluticasone (FLONASE) 50 MCG/ACT nasal  spray USE TWO SPRAYS EACH NOSTRIL EVERY DAY  16 g 5  . levothyroxine (SYNTHROID, LEVOTHROID) 112 MCG tablet TAKE ONE TABLET BY MOUTH ONE TIME DAILY BEFORE BREAKFAST 30 tablet 5  . metoprolol (LOPRESSOR) 50 MG tablet Take 1 tablet (50 mg total) by mouth 2 (two) times daily. 30 tablet 11  . Multiple Vitamin (MULTIVITAMIN) capsule Take 1 capsule by mouth daily.    . nitroGLYCERIN (NITROSTAT) 0.4 MG SL tablet Place 1 tablet (0.4 mg total) under the tongue every 5 (five) minutes as needed. 25 tablet 3  . Omega-3 Fatty Acids (FISH OIL) 1200 MG CAPS Take 1 capsule by mouth daily.    Marland Kitchen omeprazole (PRILOSEC) 20 MG capsule TAKE ONE CAPSULE BY MOUTH ONE TIME DAILY  30 capsule 5  . rosuvastatin (CRESTOR) 40 MG tablet Take 1 tablet (40 mg total) by mouth daily. 30 tablet 5  . tiotropium (SPIRIVA) 18 MCG inhalation capsule Place 1 capsule (18 mcg total) into inhaler and inhale daily. 90 capsule 3  . UNABLE TO FIND Med Name: Sinus and Allergy relief PE Per box directions as needed     No current facility-administered medications for this visit.    Allergies:   Benzocaine; Darvon; Monosodium glutamate; Neosporin; and Nicoderm    Social History:  The patient  reports that she has been smoking Cigarettes.  She started smoking about 54 years ago. She has a 53 pack-year smoking history. She has never used smokeless tobacco. She reports that she drinks about 0.6 oz of alcohol per week. She reports that she does not use illicit drugs.   Family History:  The patient's family history includes Cancer in her father; Colon cancer in her father; Diabetes in an other family member; Hypertension in her mother and another family member; Lung cancer in her father.    ROS:  Please see the history of present illness.    Otherwise, review of systems positive for none.   All other systems are reviewed and negative.    PHYSICAL EXAM: VS:  BP 142/86 mmHg  Pulse 54  Resp 16  Ht 5\' 5"  (1.651 m)  Wt 77.565 kg (171 lb)  BMI  28.46 kg/m2 , BMI Body mass index is 28.46 kg/(m^2).  General: Alert, oriented x3, no distress Head: no evidence of trauma, PERRL, EOMI, no exophtalmos or lid lag, no myxedema, no xanthelasma; normal ears, nose and oropharynx Neck: normal jugular venous pulsations and no hepatojugular reflux; brisk carotid pulses without delay and no carotid bruits Chest: clear to auscultation, no signs of consolidation by percussion or palpation, normal fremitus, symmetrical and full respiratory excursions Cardiovascular: normal position and quality of the apical impulse, regular rhythm, normal first and second heart sounds, no murmurs, rubs or gallops Abdomen: no tenderness or distention, no masses by palpation, no abnormal  pulsatility or arterial bruits, normal bowel sounds, no hepatosplenomegaly Extremities: no clubbing, cyanosis or edema; 2+ radial, ulnar and brachial pulses bilaterally; 2+ right femoral, posterior tibial and dorsalis pedis pulses; 2+ left femoral, posterior tibial and dorsalis pedis pulses; no subclavian or femoral bruits Neurological: grossly nonfocal Psych: euthymic mood, full affect   EKG:  EKG is ordered today. The ekg ordered today demonstrates bradycardia with a background sinus mechanism and pauses that most likely due to second-degree Mobitz type I sinoatrial block abscess cannot exclude block the junctional beats)   Recent Labs: 11/25/2014: ALT 22; BUN 12; Creatinine 0.9; Hemoglobin 14.2; Platelets 287.0; Potassium 4.5; Sodium 132*; TSH 1.13    Lipid Panel    Component Value Date/Time   CHOL 151 11/25/2014 1141   TRIG 211.0* 11/25/2014 1141   HDL 43.90 11/25/2014 1141   CHOLHDL 3 11/25/2014 1141   VLDL 42.2* 11/25/2014 1141   LDLCALC 75 03/24/2014 1015   LDLDIRECT 77.1 11/25/2014 1141      Wt Readings from Last 3 Encounters:  05/08/15 77.565 kg (171 lb)  05/06/15 77.656 kg (171 lb 3.2 oz)  01/16/15 78.019 kg (172 lb)      ASSESSMENT AND PLAN:  Coronary artery  disease Currently asymptomatic. Her coronary risk factors are generally well addressed with the exception of ongoing tobacco abuse. She does not seem to be committed to smoking cessation. Dr. Lake Bells and I will continue our efforts to convince her of the importance of smoking cessation and help her achieve this goal.   Hypertension Well-controlled on beta blocker monotherapy in the recent past. Plan to reduce beta blocker and may have to introduce an alternative agent for blood pressure control   Hyperlipidemia Her LDL cholesterol is excellent when compared to her baseline. There has been a greater than 50% reduction. She still has mild residual hypertriglyceridemia. She prefers not to add another medication.  Sinoatrial block versus blocked junctional premature beats She states that she feels much better when she takes her beta blocker rather than when she misses it, but her bradycardia is becoming a little more prominent. Will try to decrease dose to 50 mg twice a day.  COPD   Current medicines are reviewed at length with the patient today.  The patient does not have concerns regarding medicines.  The following changes have been made:  Reduce metoprolol to 50 mg twice daily. Monitor blood pressure at home and call us if it is elevated.  Labs/ tests ordered today include:  Orders Placed This Encounter  Procedures  . EKG 12-Lead    Patient Instructions  Medication Instructions:  DECREASE Metoprolol to 50 mg twice daily. You may take a half tablet twice daily of your current prescription. A new prescription with the correct dose has been sent to your pharmacy electronically.  Follow-Up: Dr Sallyanne Kuster recommends that you schedule a follow-up appointment in 6 months.     Mikael Spray, MD  05/10/2015 11:51 AM    Sanda Klein, MD, [facility] [phone] office [phone] pager

## 2015-05-08 NOTE — Patient Instructions (Signed)
Medication Instructions:  DECREASE Metoprolol to 50 mg twice daily. You may take a half tablet twice daily of your current prescription. A new prescription with the correct dose has been sent to your pharmacy electronically.  Follow-Up: Dr Sallyanne Kuster recommends that you schedule a follow-up appointment in 6 months.

## 2015-05-10 ENCOUNTER — Encounter: Payer: Self-pay | Admitting: Cardiovascular Disease

## 2015-05-11 ENCOUNTER — Telehealth: Payer: Self-pay | Admitting: Cardiovascular Disease

## 2015-05-11 NOTE — Telephone Encounter (Signed)
Spoke to patient. She states recent dose reduction of metoprolol from 100mg  BID to 50mg  BID.  She reports soon after this, she felt "like crap", was dizzy, lightheaded, had episode where HR was "just racing", nausea.  She reports that this happened on Friday. On Saturday, she went back to her previous dose of 100mg  metoprolol BID. Reports feeling much better. She does state occasional dizziness historically at this dose, but she states she would much rather endure this than the flood of other symptoms she was having.  I advised this was OK - continue to monitor BPs, HR. Would send to Dr. Sallyanne Kuster for his review/advice.     Pt informs me she does not need a callback unless other advice recommended.

## 2015-05-11 NOTE — Telephone Encounter (Signed)
Pt Metoprolol dose was decreased,she did not do well with that. Pt is now back on 200 mg of Metoprolol a day.Please call.

## 2015-05-12 NOTE — Telephone Encounter (Signed)
OK, will stick to 100 mg BID dose. Please report any syncope/near-syncope

## 2015-05-13 DIAGNOSIS — M9901 Segmental and somatic dysfunction of cervical region: Secondary | ICD-10-CM | POA: Diagnosis not present

## 2015-05-13 DIAGNOSIS — M9902 Segmental and somatic dysfunction of thoracic region: Secondary | ICD-10-CM | POA: Diagnosis not present

## 2015-05-13 DIAGNOSIS — M5033 Other cervical disc degeneration, cervicothoracic region: Secondary | ICD-10-CM | POA: Diagnosis not present

## 2015-05-13 DIAGNOSIS — M5414 Radiculopathy, thoracic region: Secondary | ICD-10-CM | POA: Diagnosis not present

## 2015-05-27 ENCOUNTER — Other Ambulatory Visit: Payer: Self-pay | Admitting: Internal Medicine

## 2015-05-29 ENCOUNTER — Ambulatory Visit (INDEPENDENT_AMBULATORY_CARE_PROVIDER_SITE_OTHER): Payer: Medicare Other | Admitting: Internal Medicine

## 2015-05-29 ENCOUNTER — Encounter: Payer: Self-pay | Admitting: Internal Medicine

## 2015-05-29 VITALS — BP 118/72 | HR 59 | Temp 97.5°F | Ht 64.0 in | Wt 172.1 lb

## 2015-05-29 DIAGNOSIS — J449 Chronic obstructive pulmonary disease, unspecified: Secondary | ICD-10-CM

## 2015-05-29 DIAGNOSIS — Z720 Tobacco use: Secondary | ICD-10-CM

## 2015-05-29 DIAGNOSIS — I2510 Atherosclerotic heart disease of native coronary artery without angina pectoris: Secondary | ICD-10-CM | POA: Diagnosis not present

## 2015-05-29 DIAGNOSIS — I10 Essential (primary) hypertension: Secondary | ICD-10-CM | POA: Diagnosis not present

## 2015-05-29 DIAGNOSIS — E039 Hypothyroidism, unspecified: Secondary | ICD-10-CM

## 2015-05-29 DIAGNOSIS — Z780 Asymptomatic menopausal state: Secondary | ICD-10-CM

## 2015-05-29 LAB — COMPREHENSIVE METABOLIC PANEL
ALT: 17 U/L (ref 0–35)
AST: 24 U/L (ref 0–37)
Albumin: 4.1 g/dL (ref 3.5–5.2)
Alkaline Phosphatase: 70 U/L (ref 39–117)
BILIRUBIN TOTAL: 0.3 mg/dL (ref 0.2–1.2)
BUN: 10 mg/dL (ref 6–23)
CO2: 29 meq/L (ref 19–32)
CREATININE: 0.95 mg/dL (ref 0.40–1.20)
Calcium: 9.6 mg/dL (ref 8.4–10.5)
Chloride: 102 mEq/L (ref 96–112)
GFR: 62.15 mL/min (ref >60.00)
GLUCOSE: 80 mg/dL (ref 70–99)
Potassium: 4.4 mEq/L (ref 3.5–5.1)
Sodium: 134 mEq/L — ABNORMAL LOW (ref 135–145)
Total Protein: 7.1 g/dL (ref 6.0–8.3)

## 2015-05-29 LAB — TSH: TSH: 2.27 u[IU]/mL (ref 0.35–4.50)

## 2015-05-29 MED ORDER — METOPROLOL TARTRATE 100 MG PO TABS: 100.0000 mg | ORAL_TABLET | Freq: Two times a day (BID) | ORAL | Status: DC

## 2015-05-29 NOTE — Patient Instructions (Addendum)
Labs today.  Continue to work on limiting smoking.  Follow up in 6 months.

## 2015-05-29 NOTE — Assessment & Plan Note (Signed)
Will check TSH with labs. Continue Levothyroxine. 

## 2015-05-29 NOTE — Progress Notes (Signed)
Subjective:    Patient ID: Jodi Beasley, female    DOB: Oct 13, 1946, 69 y.o.   MRN: [id number]  HPI  69YO female presents for follow up.  COPD - On assistance program for Symbicort. Continues to smoke. No recent exacerbations.  Upset that her forms were faxed to wrong company for med assistance. Out of Crestor for a few weeks.  Seen by Lorane Gell for wrist pain. Treated with brace for wrist pain.  Used brace, no improvement. Seen by Dr. Tamala Julian. Had cortisone shot with some improvement. Every time has had an injection. Feels caused by overuse. Unhappy with care.  Recently had dose of Metoprolol increased to 100mg  bid. Tolerating well.  Cut back on smoking to less than one pack per day.  Past medical, surgical, family and social history per today's encounter.  Review of Systems  Constitutional: Negative for fever, chills, appetite change, fatigue and unexpected weight change.  Eyes: Negative for visual disturbance.  Respiratory: Negative for cough, chest tightness, shortness of breath and wheezing.   Cardiovascular: Negative for chest pain and leg swelling.  Gastrointestinal: Negative for nausea, vomiting, abdominal pain, diarrhea and constipation.  Musculoskeletal: Positive for arthralgias. Negative for myalgias.  Skin: Negative for color change and rash.  Hematological: Negative for adenopathy. Does not bruise/bleed easily.  Psychiatric/Behavioral: Negative for sleep disturbance and dysphoric mood. The patient is not nervous/anxious.        Objective:    BP 118/72 mmHg  Pulse 59  Temp(Src) 97.5 F (36.4 C) (Oral)  Ht 5\' 4"  (1.626 m)  Wt 172 lb 2 oz (78.075 kg)  BMI 29.53 kg/m2  SpO2 96% Physical Exam  Constitutional: She is oriented to person, place, and time. She appears well-developed and well-nourished. No distress.  HENT:  Head: Normocephalic and atraumatic.  Right Ear: External ear normal.  Left Ear: External ear normal.  Nose: Nose normal.  Mouth/Throat:  Oropharynx is clear and moist. No oropharyngeal exudate.  Eyes: Conjunctivae are normal. Pupils are equal, round, and reactive to light. Right eye exhibits no discharge. Left eye exhibits no discharge. No scleral icterus.  Neck: Normal range of motion. Neck supple. No tracheal deviation present. No thyromegaly present.  Cardiovascular: Normal rate, regular rhythm, normal heart sounds and intact distal pulses.  Exam reveals no gallop and no friction rub.   No murmur heard. Pulmonary/Chest: Effort normal. No accessory muscle usage. No respiratory distress. She has decreased breath sounds (prolonged expiration). She has no wheezes. She has no rhonchi. She has no rales. She exhibits no tenderness.  Musculoskeletal: Normal range of motion. She exhibits no edema or tenderness.  Lymphadenopathy:    She has no cervical adenopathy.  Neurological: She is alert and oriented to person, place, and time. No cranial nerve deficit. She exhibits normal muscle tone. Coordination normal.  Skin: Skin is warm and dry. No rash noted. She is not diaphoretic. No erythema. No pallor.  Psychiatric: She has a normal mood and affect. Her behavior is normal. Judgment and thought content normal.          Assessment & Plan:   Problem List Items Addressed This Visit      Unprioritized   COPD (chronic obstructive pulmonary disease) with chronic bronchitis    Encouraged smoking cessation. She is currently taking BeePollen as she believes this lowers her risk of bronchitis. Discussed risks of BeePollen including liver failure. Encouraged compliance with her inhaled bronchodilators.       Coronary artery disease   Relevant Medications  metoprolol (LOPRESSOR) 100 MG tablet   Hypertension - Primary    BP Readings from Last 3 Encounters:  05/29/15 118/72  05/08/15 142/86  05/06/15 122/80   BP well controlled. Continue current medication. Renal function with labs.      Relevant Medications   metoprolol  (LOPRESSOR) 100 MG tablet   Other Relevant Orders   Comprehensive metabolic panel   Hypothyroidism    Will check TSH with labs. Continue Levothyroxine.      Relevant Medications   metoprolol (LOPRESSOR) 100 MG tablet   Other Relevant Orders   TSH   Tobacco abuse    Strongly encouraged smoking cessation. Encouraged her to consider lung cancer screening with CT chest. She declines.       Other Visit Diagnoses    Postmenopausal estrogen deficiency        Relevant Orders    DG Bone Density        Return in about 6 months (around 11/28/2015) for Physical.

## 2015-05-29 NOTE — Assessment & Plan Note (Signed)
Strongly encouraged smoking cessation. Encouraged her to consider lung cancer screening with CT chest. She declines.

## 2015-05-29 NOTE — Assessment & Plan Note (Signed)
Encouraged smoking cessation. She is currently taking BeePollen as she believes this lowers her risk of bronchitis. Discussed risks of BeePollen including liver failure. Encouraged compliance with her inhaled bronchodilators.

## 2015-05-29 NOTE — Assessment & Plan Note (Signed)
BP Readings from Last 3 Encounters:  05/29/15 118/72  05/08/15 142/86  05/06/15 122/80   BP well controlled. Continue current medication. Renal function with labs.

## 2015-05-29 NOTE — Progress Notes (Signed)
Pre visit review using our clinic review tool, if applicable. No additional management support is needed unless otherwise documented below in the visit note. 

## 2015-06-10 DIAGNOSIS — M9901 Segmental and somatic dysfunction of cervical region: Secondary | ICD-10-CM | POA: Diagnosis not present

## 2015-06-10 DIAGNOSIS — M9902 Segmental and somatic dysfunction of thoracic region: Secondary | ICD-10-CM | POA: Diagnosis not present

## 2015-06-10 DIAGNOSIS — M5033 Other cervical disc degeneration, cervicothoracic region: Secondary | ICD-10-CM | POA: Diagnosis not present

## 2015-06-10 DIAGNOSIS — M5414 Radiculopathy, thoracic region: Secondary | ICD-10-CM | POA: Diagnosis not present

## 2015-07-03 ENCOUNTER — Other Ambulatory Visit: Payer: Self-pay | Admitting: *Deleted

## 2015-07-03 MED ORDER — CARISOPRODOL 350 MG PO TABS: ORAL_TABLET | ORAL | Status: DC

## 2015-07-03 NOTE — Telephone Encounter (Signed)
Fine to fill. 

## 2015-07-03 NOTE — Telephone Encounter (Signed)
Pt states that [facility] sent a refill request for her medication & it was refused & marked as "patient unknown" Please advise.

## 2015-07-06 DIAGNOSIS — L821 Other seborrheic keratosis: Secondary | ICD-10-CM | POA: Diagnosis not present

## 2015-07-06 DIAGNOSIS — L720 Epidermal cyst: Secondary | ICD-10-CM | POA: Diagnosis not present

## 2015-07-06 DIAGNOSIS — Z1283 Encounter for screening for malignant neoplasm of skin: Secondary | ICD-10-CM | POA: Diagnosis not present

## 2015-07-06 DIAGNOSIS — L578 Other skin changes due to chronic exposure to nonionizing radiation: Secondary | ICD-10-CM | POA: Diagnosis not present

## 2015-07-06 DIAGNOSIS — L814 Other melanin hyperpigmentation: Secondary | ICD-10-CM | POA: Diagnosis not present

## 2015-07-06 DIAGNOSIS — D229 Melanocytic nevi, unspecified: Secondary | ICD-10-CM | POA: Diagnosis not present

## 2015-07-07 DIAGNOSIS — M5414 Radiculopathy, thoracic region: Secondary | ICD-10-CM | POA: Diagnosis not present

## 2015-07-07 DIAGNOSIS — M9901 Segmental and somatic dysfunction of cervical region: Secondary | ICD-10-CM | POA: Diagnosis not present

## 2015-07-07 DIAGNOSIS — M9902 Segmental and somatic dysfunction of thoracic region: Secondary | ICD-10-CM | POA: Diagnosis not present

## 2015-07-07 DIAGNOSIS — M5033 Other cervical disc degeneration, cervicothoracic region: Secondary | ICD-10-CM | POA: Diagnosis not present

## 2015-07-08 ENCOUNTER — Ambulatory Visit
Admission: RE | Admit: 2015-07-08 | Discharge: 2015-07-08 | Disposition: A | Discharge status: Home / Self Care | Payer: Medicare Other | Source: Ambulatory Visit | Attending: Internal Medicine | Admitting: Internal Medicine

## 2015-07-08 DIAGNOSIS — Z780 Asymptomatic menopausal state: Secondary | ICD-10-CM

## 2015-07-08 DIAGNOSIS — M8580 Other specified disorders of bone density and structure, unspecified site: Secondary | ICD-10-CM | POA: Insufficient documentation

## 2015-07-08 DIAGNOSIS — M85852 Other specified disorders of bone density and structure, left thigh: Secondary | ICD-10-CM | POA: Diagnosis not present

## 2015-07-15 ENCOUNTER — Telehealth: Payer: Self-pay | Admitting: Internal Medicine

## 2015-07-15 NOTE — Telephone Encounter (Signed)
Pt called wanting to know if you would take her mother (ann berkley 73yr) on as a NEW PT.Marland Kitchen Please advise

## 2015-07-15 NOTE — Telephone Encounter (Signed)
I would recommend that she see one of the new providers.

## 2015-07-15 NOTE — Telephone Encounter (Signed)
Left vm for patient to return my call. °

## 2015-07-16 NOTE — Telephone Encounter (Signed)
Notified patient's daughter of this. Patient's daughter states that she has scheduled an appt with a provider at [facility].

## 2015-07-20 ENCOUNTER — Encounter: Payer: Self-pay | Admitting: Internal Medicine

## 2015-07-20 ENCOUNTER — Ambulatory Visit (INDEPENDENT_AMBULATORY_CARE_PROVIDER_SITE_OTHER): Payer: Medicare Other | Admitting: Internal Medicine

## 2015-07-20 VITALS — BP 125/73 | HR 63 | Temp 97.9°F | Ht 64.0 in | Wt 172.0 lb

## 2015-07-20 DIAGNOSIS — I2510 Atherosclerotic heart disease of native coronary artery without angina pectoris: Secondary | ICD-10-CM | POA: Diagnosis not present

## 2015-07-20 DIAGNOSIS — J441 Chronic obstructive pulmonary disease with (acute) exacerbation: Secondary | ICD-10-CM | POA: Diagnosis not present

## 2015-07-20 DIAGNOSIS — M8580 Other specified disorders of bone density and structure, unspecified site: Secondary | ICD-10-CM | POA: Diagnosis not present

## 2015-07-20 DIAGNOSIS — M810 Age-related osteoporosis without current pathological fracture: Secondary | ICD-10-CM | POA: Insufficient documentation

## 2015-07-20 MED ORDER — ALENDRONATE SODIUM 70 MG PO TABS: 70.0000 mg | ORAL_TABLET | ORAL | Status: DC

## 2015-07-20 MED ORDER — AZITHROMYCIN 250 MG PO TABS: ORAL_TABLET | ORAL | Status: DC

## 2015-07-20 MED ORDER — PREDNISONE 10 MG PO TABS: ORAL_TABLET | ORAL | Status: DC

## 2015-07-20 NOTE — Patient Instructions (Addendum)
Take Vit D 1000-2000units daily.  Continue dietary calcium.  Start Fosamax 70mg  weekly to help strengthen bones.  Start Prednisone taper and Azithromycin to treat respiratory infection.  Call if symptoms are not improving.

## 2015-07-20 NOTE — Assessment & Plan Note (Signed)
Symptoms and exam most c/w COPD exacerbation. Will start Prednisone taper and Azithromycin. Continue Albuterol and Symbicort. Follow up if symptoms not improving.

## 2015-07-20 NOTE — Assessment & Plan Note (Signed)
Reviewed bone density testing with pt. T-score -1.2. Recommended adequate dietary calcium and supplemental Vit D 1000-2000units daily. Will check Vit D with next labs. Encouraged physical activity.  Start Fosamax 70mg  weekly. Discussed potential side effects of this medication. Follow up prn and plan repeat bone density in 2018.

## 2015-07-20 NOTE — Progress Notes (Signed)
Subjective:    Patient ID: Jodi Beasley, female    DOB: [birth year], 69 y.o.   MRN: [id number]  HPI  69YO female presents for follow up of bone density testing.  Osteopenia - Recent bone density testing showed T-score -1.2. 10 year risk fracture 23%. Currently taking Vit D and Ca. May have taken medication for bone density in the past, cannot recall.  Sinus congestion - Dry cough over last week or so. Sinus pressure, pain. Feels more short of breath. Taking Benadryl, using inhalers. No fever, chills. No chest pain.   Past Medical History  Diagnosis Date  . Depression   . Chicken pox   . GERD (gastroesophageal reflux disease)   . Hay fever   . History of blood transfusion   . Pneumonia, organism unspecified   . Hypothyroid   . COPD (chronic obstructive pulmonary disease)   . History of syncope 2000    negative EP study  . History of hypercholesterolemia   . Arrhythmia   . Hyperlipidemia     intolerance to statins except Crestor.   . Coronary artery disease     NSTEMI in 09/2008. Cath : 80% RCA and 95% first diagonal. PCI and 2 DES placement  RCA and 1 DES to diagonal. Complicated by acute diagonal stent thrombosis . Treated by thrombectomy. Most recent nuclear stress test in 2010 showed no ischemia with normal EF.   Marland Kitchen Hypertension   . MI (myocardial infarction)    Family History  Problem Relation Age of Onset  . Colon cancer Father   . Lung cancer Father     was a former smoker  . Cancer Father     lung  . Hypertension    . Diabetes    . Hypertension Mother    Past Surgical History  Procedure Laterality Date  . Tubal ligation    . Tonsillectomy    . Coronary angioplasty with stent placement  2009    [facility], drug-eluting mid RCA,prox diag;   Social History   Social History  . Marital Status: Single    Spouse Name: N/A  . Number of Children: N/A  . Years of Education: N/A   Social History Main Topics  . Smoking status: Current Every Day Smoker --  1.00 packs/day for 53 years    Types: Cigarettes    Start date: 11/19/1960  . Smokeless tobacco: Never Used  . Alcohol Use: 0.6 oz/week    1 Standard drinks or equivalent per week  . Drug Use: No  . Sexual Activity: Not Asked   Other Topics Concern  . None   Social History Narrative   Lives with mother in [place]. 2 cats 2 dogs in home.   Recently moved from [facility].   Custody of 46month old.    Review of Systems  Constitutional: Negative for fever, chills and unexpected weight change.  HENT: Positive for congestion and sinus pressure. Negative for ear discharge, ear pain, facial swelling, hearing loss, mouth sores, nosebleeds, postnasal drip, rhinorrhea, sneezing, sore throat, tinnitus, trouble swallowing and voice change.   Eyes: Negative for pain, discharge, redness and visual disturbance.  Respiratory: Positive for cough, shortness of breath and wheezing. Negative for chest tightness and stridor.   Cardiovascular: Negative for chest pain, palpitations and leg swelling.  Musculoskeletal: Negative for myalgias, arthralgias, neck pain and neck stiffness.  Skin: Negative for color change and rash.  Neurological: Negative for dizziness, weakness, light-headedness and headaches.  Hematological: Negative for adenopathy.  Objective:    BP 125/73 mmHg  Pulse 63  Temp(Src) 97.9 F (36.6 C) (Oral)  Ht 5\' 4"  (1.626 m)  Wt 172 lb (78.019 kg)  BMI 29.51 kg/m2  SpO2 97% Physical Exam  Constitutional: She is oriented to person, place, and time. She appears well-developed and well-nourished. No distress.  HENT:  Head: Normocephalic and atraumatic.  Right Ear: External ear normal.  Left Ear: External ear normal.  Nose: Nose normal.  Mouth/Throat: Oropharynx is clear and moist. No oropharyngeal exudate.  Eyes: Conjunctivae are normal. Pupils are equal, round, and reactive to light. Right eye exhibits no discharge. Left eye exhibits no discharge. No scleral icterus.  Neck:  Normal range of motion. Neck supple. No tracheal deviation present. No thyromegaly present.  Cardiovascular: Normal rate, regular rhythm, normal heart sounds and intact distal pulses.  Exam reveals no gallop and no friction rub.   No murmur heard. Pulmonary/Chest: Effort normal. No accessory muscle usage. No tachypnea. No respiratory distress. She has decreased breath sounds (prolonged expiration). She has no wheezes. She has rhonchi. She has no rales. She exhibits no tenderness.  Musculoskeletal: Normal range of motion. She exhibits no edema or tenderness.  Lymphadenopathy:    She has no cervical adenopathy.  Neurological: She is alert and oriented to person, place, and time. No cranial nerve deficit. She exhibits normal muscle tone. Coordination normal.  Skin: Skin is warm and dry. No rash noted. She is not diaphoretic. No erythema. No pallor.  Psychiatric: She has a normal mood and affect. Her behavior is normal. Judgment and thought content normal.          Assessment & Plan:   Problem List Items Addressed This Visit      Unprioritized   Acute exacerbation of chronic obstructive pulmonary disease (COPD)    Symptoms and exam most c/w COPD exacerbation. Will start Prednisone taper and Azithromycin. Continue Albuterol and Symbicort. Follow up if symptoms not improving.      Relevant Medications   predniSONE (DELTASONE) 10 MG tablet   azithromycin (ZITHROMAX Z-PAK) 250 MG tablet   Osteopenia - Primary    Reviewed bone density testing with pt. T-score -1.2. Recommended adequate dietary calcium and supplemental Vit D 1000-2000units daily. Will check Vit D with next labs. Encouraged physical activity.  Start Fosamax 70mg  weekly. Discussed potential side effects of this medication. Follow up prn and plan repeat bone density in 2018.          Return in about 4 weeks (around 08/17/2015), or if symptoms worsen or fail to improve, for Recheck.

## 2015-07-20 NOTE — Progress Notes (Signed)
Pre visit review using our clinic review tool, if applicable. No additional management support is needed unless otherwise documented below in the visit note. 

## 2015-07-24 ENCOUNTER — Telehealth: Payer: Self-pay

## 2015-07-24 DIAGNOSIS — R05 Cough: Secondary | ICD-10-CM

## 2015-07-24 DIAGNOSIS — R059 Cough, unspecified: Secondary | ICD-10-CM

## 2015-07-24 MED ORDER — HYDROCOD POLST-CPM POLST ER 10-8 MG/5ML PO SUER: 5.0000 mL | Freq: Two times a day (BID) | ORAL | Status: DC | PRN

## 2015-07-24 NOTE — Telephone Encounter (Signed)
If cough is persistent, we can print an Rx for Tussionex for her to pick up and order a CXR for [facility].

## 2015-07-24 NOTE — Telephone Encounter (Signed)
Patient called and stated that she had an appt with Dr. Gilford Rile recently for a cough. She states that Dr. Gilford Rile gave her a Z-Pac, and she was told that if she was not feeling better by today, then she needed to give [place] a call. Patient said that she is not feeling any better, and that she wants to know if there is any kind of cough medicine that Dr. Gilford Rile can send in for her.

## 2015-07-24 NOTE — Telephone Encounter (Signed)
Spoke with patient. She would like to come pick up the Tussionex today, and she was also wondering about a stronger antibiotic. She feels like the Zpac did not help her at all. Patient is okay with getting an CXR.

## 2015-07-27 ENCOUNTER — Ambulatory Visit (INDEPENDENT_AMBULATORY_CARE_PROVIDER_SITE_OTHER)
Admission: RE | Admit: 2015-07-27 | Discharge: 2015-07-27 | Disposition: A | Discharge status: Home / Self Care | Payer: Medicare Other | Source: Ambulatory Visit | Attending: Internal Medicine | Admitting: Internal Medicine

## 2015-07-27 DIAGNOSIS — R05 Cough: Secondary | ICD-10-CM | POA: Diagnosis not present

## 2015-07-27 DIAGNOSIS — R059 Cough, unspecified: Secondary | ICD-10-CM

## 2015-07-27 DIAGNOSIS — J449 Chronic obstructive pulmonary disease, unspecified: Secondary | ICD-10-CM | POA: Diagnosis not present

## 2015-08-06 ENCOUNTER — Ambulatory Visit: Payer: Medicare Other

## 2015-08-17 ENCOUNTER — Encounter: Payer: Self-pay | Admitting: Family Medicine

## 2015-08-17 ENCOUNTER — Ambulatory Visit (INDEPENDENT_AMBULATORY_CARE_PROVIDER_SITE_OTHER): Payer: Medicare Other | Admitting: Family Medicine

## 2015-08-17 VITALS — BP 128/76 | HR 81 | Temp 98.5°F | Ht 64.0 in | Wt 172.0 lb

## 2015-08-17 DIAGNOSIS — J441 Chronic obstructive pulmonary disease with (acute) exacerbation: Secondary | ICD-10-CM | POA: Diagnosis not present

## 2015-08-17 DIAGNOSIS — I2510 Atherosclerotic heart disease of native coronary artery without angina pectoris: Secondary | ICD-10-CM | POA: Diagnosis not present

## 2015-08-17 DIAGNOSIS — J0110 Acute frontal sinusitis, unspecified: Secondary | ICD-10-CM

## 2015-08-17 MED ORDER — HYDROCOD POLST-CPM POLST ER 10-8 MG/5ML PO SUER: 5.0000 mL | Freq: Two times a day (BID) | ORAL | Status: DC | PRN

## 2015-08-17 MED ORDER — PREDNISONE 50 MG PO TABS: 50.0000 mg | ORAL_TABLET | Freq: Every day | ORAL | Status: DC

## 2015-08-17 MED ORDER — AMOXICILLIN-POT CLAVULANATE 875-125 MG PO TABS: 1.0000 | ORAL_TABLET | Freq: Two times a day (BID) | ORAL | Status: DC

## 2015-08-17 NOTE — Patient Instructions (Addendum)
Nice to meet you. We will treat you for a sinus infection with augmentin. We will also place you on prednisone for COPD exacerbation.  If you develop shortness of breath, chest pain, fever, blood in your sputum, or feel poorly please seek medical attention.

## 2015-08-17 NOTE — Progress Notes (Signed)
Pre visit review using our clinic review tool, if applicable. No additional management support is needed unless otherwise documented below in the visit note. 

## 2015-08-18 DIAGNOSIS — J329 Chronic sinusitis, unspecified: Secondary | ICD-10-CM | POA: Insufficient documentation

## 2015-08-18 NOTE — Assessment & Plan Note (Signed)
Patient with persistent COPD exacerbation symptoms. Is somewhat improved from previously. Normal lung exam. Normal O2 sat. Well appearing. Will treat with another course of prednisone and augmentin for possible COPD component. Discussed obtaining a CXR, though patient declined this. Prescription for cough suppressant given. Given return precautions.

## 2015-08-18 NOTE — Assessment & Plan Note (Addendum)
Has symptoms consistent with sinusitis. Symptoms worsened from last visit. Recently treated with azithro though no improvement. Will treat with augmentin to provide more adequate sinusitis coverage. Given return precautions.

## 2015-08-18 NOTE — Progress Notes (Signed)
Patient ID: Jodi Beasley, female   DOB: [birth year], 69 y.o.   MRN: [id number]  [name], MD Phone: [phone]  Jodi Beasley is a 69 y.o. female who presents today for same day appointment.   Patient notes that she has had continued issues with her sinuses since her recent treatment for COPD exacerbation. Notes worsened sinus pressure and congestion. She notes she has a cough that is not productive. She notes she is still mildly short of breath. She feels the prednisone helped. She still has some wheezing. Denies fevers. Has been taking symbicort BID. Uses albuterol 1x/day. Had a CXR 07/27/15 with no acute changes. Notes the majority of her symptoms are now in her sinuses, though still with some COPD symptoms. No chest pain.   PMH: smoker.    ROS see HPI  Objective  Physical Exam Filed Vitals:   08/17/15 1353  BP: 128/76  Pulse: 81  Temp: 98.5 F (36.9 C)    Physical Exam  Constitutional: She is well-developed, well-nourished, and in no distress.  HENT:  Head: Normocephalic and atraumatic.  Right Ear: External ear normal.  Left Ear: External ear normal.  Mouth/Throat: No oropharyngeal exudate.  Normal TM bilaterally, posterior OP with mild erythema  Eyes: Conjunctivae are normal. Pupils are equal, round, and reactive to light.  Neck: Neck supple.  Cardiovascular: Normal rate, regular rhythm and normal heart sounds.  Exam reveals no gallop and no friction rub.   No murmur heard. Pulmonary/Chest: Effort normal and breath sounds normal. No respiratory distress. She has no wheezes. She has no rales.  Lymphadenopathy:    She has no cervical adenopathy.  Neurological: She is alert. Gait normal.  Skin: Skin is warm and dry. She is not diaphoretic.     Assessment/Plan: Please see individual problem list.  Sinusitis Has symptoms consistent with sinusitis. Symptoms worsened from last visit. Recently treated with azithro though no improvement. Will treat with augmentin to  provide more adequate sinusitis coverage. Given return precautions.   Acute exacerbation of chronic obstructive pulmonary disease (COPD) Patient with persistent COPD exacerbation symptoms. Is somewhat improved from previously. Normal lung exam. Normal O2 sat. Well appearing. Will treat with another course of prednisone and augmentin for possible COPD component. Discussed obtaining a CXR, though patient declined this. Prescription for cough suppressant given. Given return precautions.     Meds ordered this encounter  Medications  . predniSONE (DELTASONE) 50 MG tablet    Sig: Take 1 tablet (50 mg total) by mouth daily with breakfast.    Dispense:  5 tablet    Refill:  0  . amoxicillin-clavulanate (AUGMENTIN) 875-125 MG per tablet    Sig: Take 1 tablet by mouth 2 (two) times daily.    Dispense:  14 tablet    Refill:  0  . chlorpheniramine-HYDROcodone (TUSSIONEX PENNKINETIC ER) 10-8 MG/5ML SUER    Sig: Take 5 mLs by mouth every 12 (twelve) hours as needed for cough.    Dispense:  140 mL    Refill:  0   [name]

## 2015-08-31 DIAGNOSIS — M9902 Segmental and somatic dysfunction of thoracic region: Secondary | ICD-10-CM | POA: Diagnosis not present

## 2015-08-31 DIAGNOSIS — M5414 Radiculopathy, thoracic region: Secondary | ICD-10-CM | POA: Diagnosis not present

## 2015-08-31 DIAGNOSIS — M9901 Segmental and somatic dysfunction of cervical region: Secondary | ICD-10-CM | POA: Diagnosis not present

## 2015-08-31 DIAGNOSIS — M5033 Other cervical disc degeneration, cervicothoracic region: Secondary | ICD-10-CM | POA: Diagnosis not present

## 2015-09-02 ENCOUNTER — Other Ambulatory Visit: Payer: Self-pay | Admitting: Internal Medicine

## 2015-09-18 DIAGNOSIS — M25531 Pain in right wrist: Secondary | ICD-10-CM | POA: Diagnosis not present

## 2015-09-18 DIAGNOSIS — M654 Radial styloid tenosynovitis [de Quervain]: Secondary | ICD-10-CM | POA: Diagnosis not present

## 2015-09-19 DIAGNOSIS — Z23 Encounter for immunization: Secondary | ICD-10-CM | POA: Diagnosis not present

## 2015-09-22 ENCOUNTER — Ambulatory Visit (INDEPENDENT_AMBULATORY_CARE_PROVIDER_SITE_OTHER): Payer: Medicare Other | Admitting: Psychology

## 2015-09-22 DIAGNOSIS — F4323 Adjustment disorder with mixed anxiety and depressed mood: Secondary | ICD-10-CM

## 2015-09-28 ENCOUNTER — Other Ambulatory Visit: Payer: Self-pay | Admitting: Internal Medicine

## 2015-10-06 DIAGNOSIS — M5414 Radiculopathy, thoracic region: Secondary | ICD-10-CM | POA: Diagnosis not present

## 2015-10-06 DIAGNOSIS — M9902 Segmental and somatic dysfunction of thoracic region: Secondary | ICD-10-CM | POA: Diagnosis not present

## 2015-10-06 DIAGNOSIS — M5033 Other cervical disc degeneration, cervicothoracic region: Secondary | ICD-10-CM | POA: Diagnosis not present

## 2015-10-06 DIAGNOSIS — M9901 Segmental and somatic dysfunction of cervical region: Secondary | ICD-10-CM | POA: Diagnosis not present

## 2015-10-07 ENCOUNTER — Ambulatory Visit (INDEPENDENT_AMBULATORY_CARE_PROVIDER_SITE_OTHER): Payer: Medicare Other | Admitting: Psychology

## 2015-10-07 DIAGNOSIS — F4323 Adjustment disorder with mixed anxiety and depressed mood: Secondary | ICD-10-CM

## 2015-10-21 ENCOUNTER — Encounter: Payer: Self-pay | Admitting: Cardiovascular Disease

## 2015-10-21 ENCOUNTER — Ambulatory Visit (INDEPENDENT_AMBULATORY_CARE_PROVIDER_SITE_OTHER): Payer: Medicare Other | Admitting: Cardiovascular Disease

## 2015-10-21 VITALS — BP 116/72 | HR 54 | Ht 65.0 in | Wt 167.4 lb

## 2015-10-21 DIAGNOSIS — Z720 Tobacco use: Secondary | ICD-10-CM

## 2015-10-21 DIAGNOSIS — E7800 Pure hypercholesterolemia, unspecified: Secondary | ICD-10-CM | POA: Diagnosis not present

## 2015-10-21 DIAGNOSIS — I2510 Atherosclerotic heart disease of native coronary artery without angina pectoris: Secondary | ICD-10-CM

## 2015-10-21 DIAGNOSIS — J439 Emphysema, unspecified: Secondary | ICD-10-CM

## 2015-10-21 NOTE — Progress Notes (Signed)
Patient ID: Jodi Beasley, female   DOB: 03-26-1946, 69 y.o.   MRN: [id number]      Cardiology Office Note   Date:  10/21/2015   ID:  Ronnett Beaufort, DOB 03/20/1946, MRN [id number]  PCP:  Rica Mast, MD  Cardiologist:   Sanda Klein, MD   Chief Complaint  Patient presents with  . Follow-up    6 MONTHS:  No complaints of chest pain, edema or dizziness.  Occas. SOB.  Unable to decrease Metoprolol due to dizziness/equlibrium.      History of Present Illness: Jodi Beasley is a 69 y.o. female who presents for CAD, arrhythmia., HTN and hyperlipidemia.   since her last appointment she has not had problem's with chest pain despite the fact that she remains active. Her 59 year old mother has moved in with her and she has to take her to and from lots of doctors appointments and perform more housework than usual. She has not had problems with worsened wheezing and has not needed to use her rescue inhaler. Her nuclear stress test a few months ago was normal. We try to wean down her dose of metoprolol because of bradycardia and COPD, but she felt very poorly , likely because she had more atrial/junctional premature beats..  She denies dizziness or syncope or excessive fatigue. Her ECG today again shows sinus bradycardia with a 1.9 second pause that either represent second-degree SA block or a blocked junctional premature beat.  She continues to smoke and has no intention to quit.    Past Medical History  Diagnosis Date  . Depression   . Chicken pox   . GERD (gastroesophageal reflux disease)   . Hay fever   . History of blood transfusion   . Pneumonia, organism unspecified   . Hypothyroid   . COPD (chronic obstructive pulmonary disease) ([place])   . History of syncope 2000    negative EP study  . History of hypercholesterolemia   . Arrhythmia   . Hyperlipidemia     intolerance to statins except Crestor.   . Coronary artery disease     NSTEMI in 09/2008. Cath : 80% RCA and 95%  first diagonal. PCI and 2 DES placement  RCA and 1 DES to diagonal. Complicated by acute diagonal stent thrombosis . Treated by thrombectomy. Most recent nuclear stress test in 2010 showed no ischemia with normal EF.   Marland Kitchen Hypertension   . MI (myocardial infarction) [facility])     Past Surgical History  Procedure Laterality Date  . Tubal ligation    . Tonsillectomy    . Coronary angioplasty with stent placement  2009    [facility], drug-eluting mid RCA,prox diag;     Current Outpatient Prescriptions  Medication Sig Dispense Refill  . albuterol (PROAIR HFA) 108 (90 BASE) MCG/ACT inhaler Inhale 2 puffs into the lungs every 6 (six) hours as needed. 18 g 6  . aspirin 81 MG tablet Take 81 mg by mouth daily.    Marland Kitchen azelastine (ASTELIN) 0.1 % nasal spray Place 1 spray into both nostrils 2 (two) times daily. Use in each nostril as directed 30 mL 12  . Bee Pollen 1000 MG TABS Take 1 tablet by mouth daily.    . budesonide-formoterol (SYMBICORT) 160-4.5 MCG/ACT inhaler Inhale 2 puffs into the lungs 2 (two) times daily. 3 Inhaler 3  . buPROPion (WELLBUTRIN XL) 150 MG 24 hr tablet TAKE ONE TABLET BY MOUTH ONE TIME DAILY 30 tablet 5  . calcium citrate-vitamin D (CITRACAL+D) 315-200 MG-UNIT per tablet Take 1  tablet by mouth 2 (two) times daily.    . carisoprodol (SOMA) 350 MG tablet TAKE ONE TABLET BY MOUTH TWICE DAILY AS NEEDED for muscle spasms 60 tablet 3  . Cholecalciferol (VITAMIN D-3) 1000 UNITS CAPS Take 1 capsule by mouth daily.    . citalopram (CELEXA) 40 MG tablet TAKE ONE TABLET BY MOUTH ONE TIME DAILY 30 tablet 5  . Coenzyme Q10 (COQ10) 400 MG CAPS Take 400 mg by mouth daily.    . diphenhydrAMINE (BENADRYL) 25 MG tablet Take 25 mg by mouth every 6 (six) hours as needed.    . fluticasone (FLONASE) 50 MCG/ACT nasal spray USE TWO SPRAYS EACH NOSTRIL EVERY DAY  16 g 5  . levothyroxine (SYNTHROID, LEVOTHROID) 112 MCG tablet TAKE ONE TABLET BY MOUTH ONE TIME DAILY BEFORE BREAKFAST 30 tablet 5  .  metoprolol (LOPRESSOR) 100 MG tablet Take 1 tablet (100 mg total) by mouth 2 (two) times daily. 180 tablet 3  . Multiple Vitamin (MULTIVITAMIN) capsule Take 1 capsule by mouth daily.    . nitroGLYCERIN (NITROSTAT) 0.4 MG SL tablet Place 1 tablet (0.4 mg total) under the tongue every 5 (five) minutes as needed. 25 tablet 3  . Omega-3 Fatty Acids (FISH OIL) 1200 MG CAPS Take 1 capsule by mouth daily.    Marland Kitchen omeprazole (PRILOSEC) 20 MG capsule TAKE ONE CAPSULE BY MOUTH ONE TIME DAILY 30 capsule 7  . rosuvastatin (CRESTOR) 40 MG tablet Take 1 tablet (40 mg total) by mouth daily. 30 tablet 5  . tiotropium (SPIRIVA) 18 MCG inhalation capsule Place 1 capsule (18 mcg total) into inhaler and inhale daily. 90 capsule 3  . UNABLE TO FIND Med Name: Sinus and Allergy relief PE Per box directions as needed     No current facility-administered medications for this visit.    Allergies:   Benzocaine; Darvon; Monosodium glutamate; Neosporin; and Nicoderm    Social History:  The patient  reports that she has been smoking Cigarettes.  She started smoking about 54 years ago. She has a 53 pack-year smoking history. She has never used smokeless tobacco. She reports that she drinks about 0.6 oz of alcohol per week. She reports that she does not use illicit drugs.   Family History:  The patient's family history includes Cancer in her father; Colon cancer in her father; Diabetes in an other family member; Hypertension in her mother and another family member; Lung cancer in her father.    ROS:  Please see the history of present illness.    Otherwise, review of systems positive for none.   All other systems are reviewed and negative.    PHYSICAL EXAM: VS:  BP 116/72 mmHg  Pulse 54  Ht 5\' 5"  (1.651 m)  Wt 167 lb 6.4 oz (75.932 kg)  BMI 27.86 kg/m2 , BMI Body mass index is 27.86 kg/(m^2).  General: Alert, oriented x3, no distress Head: no evidence of trauma, PERRL, EOMI, no exophtalmos or lid lag, no myxedema, no  xanthelasma; normal ears, nose and oropharynx Neck: normal jugular venous pulsations and no hepatojugular reflux; brisk carotid pulses without delay and no carotid bruits Chest: clear to auscultation, no signs of consolidation by percussion or palpation, normal fremitus, symmetrical and full respiratory excursions Cardiovascular: normal position and quality of the apical impulse, regular rhythm, normal first and second heart sounds, no murmurs, rubs or gallops Abdomen: no tenderness or distention, no masses by palpation, no abnormal pulsatility or arterial bruits, normal bowel sounds, no hepatosplenomegaly Extremities: no clubbing, cyanosis  or edema; 2+ radial, ulnar and brachial pulses bilaterally; 2+ right femoral, posterior tibial and dorsalis pedis pulses; 2+ left femoral, posterior tibial and dorsalis pedis pulses; no subclavian or femoral bruits Neurological: grossly nonfocal Psych: euthymic mood, full affect   EKG:  EKG is ordered today. The ekg ordered today demonstrates  Normal sinus rhythm/mild sinus bradycardia with a 1.9 second pause. Unable to tell if this is secondary to sinoatrial block, second-degree or a blocked premature junctional beats. Otherwise ECG is normal. QTC 411 ms.   Recent Labs: 11/25/2014: Hemoglobin 14.2; Platelets 287.0 05/29/2015: ALT 17; BUN 10; Creatinine, Ser 0.95; Potassium 4.4; Sodium 134*; TSH 2.27    Lipid Panel    Component Value Date/Time   CHOL 151 11/25/2014 1141   TRIG 211.0* 11/25/2014 1141   HDL 43.90 11/25/2014 1141   CHOLHDL 3 11/25/2014 1141   VLDL 42.2* 11/25/2014 1141   LDLCALC 75 03/24/2014 1015   LDLDIRECT 77.1 11/25/2014 1141      Wt Readings from Last 3 Encounters:  10/21/15 167 lb 6.4 oz (75.932 kg)  08/17/15 172 lb (78.019 kg)  07/20/15 172 lb (78.019 kg)     ASSESSMENT AND PLAN:  Coronary artery disease Currently asymptomatic. Her coronary risk factors are generally well addressed with the exception of ongoing tobacco  abuse. She does not seem to be committed to smoking cessation.  Advised her again about the importance of smoking cessation, she politely acknowledged my concern but has no intentions to quit.  Hypertension Well-controlled on beta blocker monotherapy in the recent past.   Hyperlipidemia Her LDL cholesterol is excellent when compared to her baseline. There has been a greater than 50% reduction. She still has mild residual hypertriglyceridemia. She prefers not to add another medication.  Plans to have labs in December with Dr. Gilford Rile.  Sinoatrial block versus blocked junctional premature beats  she does not have symptoms of bradycardia and attempts to wean down the dose of beta blocker lead to worsening complaints. She feels better on the higher dose beta blocker.  COPD    Current medicines are reviewed at length with the patient today.  The patient does not have concerns regarding medicines.  The following changes have been made:  no change  Labs/ tests ordered today include:   Orders Placed This Encounter  Procedures  . EKG 12-Lead     Patient Instructions  Dr. Sallyanne Kuster recommends that you schedule a follow-up appointment in: ONE YEAR        SignedSanda Klein, MD  10/21/2015 1:24 PM    Sanda Klein, MD, [facility] [phone] office [phone] pager

## 2015-10-21 NOTE — Patient Instructions (Signed)
Dr. Croitoru recommends that you schedule a follow-up appointment in: ONE YEAR   

## 2015-10-26 ENCOUNTER — Other Ambulatory Visit: Payer: Self-pay | Admitting: Internal Medicine

## 2015-10-26 ENCOUNTER — Telehealth: Payer: Self-pay | Admitting: Family Medicine

## 2015-10-26 NOTE — Telephone Encounter (Signed)
The patient wants to change her care to Dr. Caryl Bis . Dr. Caryl Bis and Dr. Gilford Rile agreed to the change. The patient was informed if she were to switch to Dr. Caryl Bis that she could not switch back to Dr. Gilford Rile. The patient agreed and she is scheduled for a physical with Dr. Caryl Bis.

## 2015-10-27 DIAGNOSIS — M5414 Radiculopathy, thoracic region: Secondary | ICD-10-CM | POA: Diagnosis not present

## 2015-10-27 DIAGNOSIS — M5033 Other cervical disc degeneration, cervicothoracic region: Secondary | ICD-10-CM | POA: Diagnosis not present

## 2015-10-27 DIAGNOSIS — M9902 Segmental and somatic dysfunction of thoracic region: Secondary | ICD-10-CM | POA: Diagnosis not present

## 2015-10-27 DIAGNOSIS — M9901 Segmental and somatic dysfunction of cervical region: Secondary | ICD-10-CM | POA: Diagnosis not present

## 2015-11-04 ENCOUNTER — Ambulatory Visit: Payer: Medicare Other | Admitting: Psychology

## 2015-11-06 ENCOUNTER — Ambulatory Visit (INDEPENDENT_AMBULATORY_CARE_PROVIDER_SITE_OTHER): Payer: Medicare Other | Admitting: Pulmonary Disease

## 2015-11-06 ENCOUNTER — Encounter: Payer: Self-pay | Admitting: Pulmonary Disease

## 2015-11-06 VITALS — BP 124/60 | HR 60 | Ht 65.0 in | Wt 166.8 lb

## 2015-11-06 DIAGNOSIS — Z720 Tobacco use: Secondary | ICD-10-CM

## 2015-11-06 DIAGNOSIS — J449 Chronic obstructive pulmonary disease, unspecified: Secondary | ICD-10-CM | POA: Diagnosis not present

## 2015-11-06 DIAGNOSIS — I2510 Atherosclerotic heart disease of native coronary artery without angina pectoris: Secondary | ICD-10-CM

## 2015-11-06 DIAGNOSIS — F17200 Nicotine dependence, unspecified, uncomplicated: Secondary | ICD-10-CM

## 2015-11-08 NOTE — Progress Notes (Signed)
PROBLEMS: COPD - FEV1 1.92 (77% pred) in 2013 Smoker - little interest in quitting  INTERVAL HISTORY: Last seen by Dr [place] Bells 6 mos ago. No major changes or respiratory problems in the interim  SUBJ: Routine ROV for COPD. Still smokes 15 cigs/d. Has minimal exercise limitation and rarely uses albuterol rescue MDI. Struggled some during summer with hot, humid weather. No significant cough, sputum. No hemoptysis, CP, LE edema   OBJ: Filed Vitals:   11/06/15 1056  BP: 124/60  Pulse: 60  Height: 5\' 5"  (1.651 m)  Weight: 166 lb 12.8 oz (75.66 kg)  SpO2: 96%    Gen: WDWN in NAD HEENT: All WNL Neck: NO LAN, no JVD noted Lungs: full BS, normal percussion note throughout, no adventitious sounds Cardiovascular: Reg rate, normal rhythm, no M noted Abdomen: Soft, NT +BS Ext: no C/C/E   DATA: CXR 07/27/15: [facility]   IMPRESSION: Chronic obstructive pulmonary disease Smoker   PLAN: I counseled in detail the importance of smoking cessation and suggestive that she try just a single day without cigarettes to experience it Continue Symbicort and Spiriva with PRN albuterol ROV 3 months with PFTs   Wilhelmina Mcardle, MD [facility]

## 2015-11-17 DIAGNOSIS — M5033 Other cervical disc degeneration, cervicothoracic region: Secondary | ICD-10-CM | POA: Diagnosis not present

## 2015-11-17 DIAGNOSIS — M5414 Radiculopathy, thoracic region: Secondary | ICD-10-CM | POA: Diagnosis not present

## 2015-11-17 DIAGNOSIS — M9901 Segmental and somatic dysfunction of cervical region: Secondary | ICD-10-CM | POA: Diagnosis not present

## 2015-11-17 DIAGNOSIS — M9902 Segmental and somatic dysfunction of thoracic region: Secondary | ICD-10-CM | POA: Diagnosis not present

## 2015-11-18 ENCOUNTER — Ambulatory Visit: Payer: Medicare Other | Admitting: Psychology

## 2015-12-01 ENCOUNTER — Other Ambulatory Visit: Payer: Self-pay | Admitting: Family Medicine

## 2015-12-01 ENCOUNTER — Other Ambulatory Visit: Payer: Self-pay | Admitting: Internal Medicine

## 2015-12-01 NOTE — Telephone Encounter (Signed)
Advise

## 2015-12-01 NOTE — Telephone Encounter (Signed)
Please call this medicine in to the pharmacy. Please inform the patient that she needs follow-up to get further refills on this medicine.

## 2015-12-01 NOTE — Telephone Encounter (Signed)
Please advise 

## 2015-12-02 ENCOUNTER — Other Ambulatory Visit: Payer: Self-pay

## 2015-12-02 ENCOUNTER — Other Ambulatory Visit: Payer: Self-pay | Admitting: Internal Medicine

## 2015-12-02 MED ORDER — CARISOPRODOL 350 MG PO TABS: ORAL_TABLET | ORAL | Status: DC

## 2015-12-02 NOTE — Telephone Encounter (Signed)
Please a

## 2015-12-03 ENCOUNTER — Ambulatory Visit (INDEPENDENT_AMBULATORY_CARE_PROVIDER_SITE_OTHER): Payer: Medicare Other | Admitting: Family Medicine

## 2015-12-03 ENCOUNTER — Encounter: Payer: Self-pay | Admitting: Internal Medicine

## 2015-12-03 ENCOUNTER — Encounter: Payer: Self-pay | Admitting: Family Medicine

## 2015-12-03 VITALS — BP 134/86 | HR 66 | Temp 98.0°F | Ht 66.0 in | Wt 168.6 lb

## 2015-12-03 DIAGNOSIS — E7800 Pure hypercholesterolemia, unspecified: Secondary | ICD-10-CM

## 2015-12-03 DIAGNOSIS — Z1159 Encounter for screening for other viral diseases: Secondary | ICD-10-CM

## 2015-12-03 DIAGNOSIS — Z8639 Personal history of other endocrine, nutritional and metabolic disease: Secondary | ICD-10-CM

## 2015-12-03 DIAGNOSIS — F4323 Adjustment disorder with mixed anxiety and depressed mood: Secondary | ICD-10-CM

## 2015-12-03 DIAGNOSIS — Z720 Tobacco use: Secondary | ICD-10-CM

## 2015-12-03 DIAGNOSIS — E785 Hyperlipidemia, unspecified: Secondary | ICD-10-CM | POA: Diagnosis not present

## 2015-12-03 DIAGNOSIS — Z1231 Encounter for screening mammogram for malignant neoplasm of breast: Secondary | ICD-10-CM | POA: Diagnosis not present

## 2015-12-03 DIAGNOSIS — I2510 Atherosclerotic heart disease of native coronary artery without angina pectoris: Secondary | ICD-10-CM | POA: Diagnosis not present

## 2015-12-03 DIAGNOSIS — E039 Hypothyroidism, unspecified: Secondary | ICD-10-CM

## 2015-12-03 DIAGNOSIS — J309 Allergic rhinitis, unspecified: Secondary | ICD-10-CM

## 2015-12-03 DIAGNOSIS — M545 Low back pain, unspecified: Secondary | ICD-10-CM

## 2015-12-03 MED ORDER — ROSUVASTATIN CALCIUM 40 MG PO TABS: 40.0000 mg | ORAL_TABLET | Freq: Every day | ORAL | Status: DC

## 2015-12-03 NOTE — Patient Instructions (Signed)
Nice to see you. Please start Flonase 2 sprays each nostril daily. Monitor your nasal congestion and this is not improved with the Flonase let us know. I refilled her Crestor. Please keep follow-up with her psychiatrist and continue current medications for depression. Feelings of depression and anxiety become more stable he can consider decreasing her doses. We will check a hepatitis C test today and call with results. If you develop fevers, shortness of breath, chest pain, back pain, numbness, weakness, loss of bowel or bladder function, numbness between her legs, thoughts of harming herself or others, or any new or changing symptoms please seek medical attention.

## 2015-12-03 NOTE — Assessment & Plan Note (Signed)
Chronic issue. No recent exacerbations. No recent pain. Jodi Beasley has been refilled previously. We'll continue to monitor. Given return precautions.

## 2015-12-03 NOTE — Assessment & Plan Note (Signed)
Tolerating medication. We'll check lipid panel today. 

## 2015-12-03 NOTE — Assessment & Plan Note (Signed)
Stable on current medication regimen. She'll continue to follow with psychiatry. We'll continue Wellbutrin and Celexa. No SI or HI. Discussed that once her life situation is more stable with her mother we could consider tapering her off one of her medicines if she desires. Given return precautions.

## 2015-12-03 NOTE — Assessment & Plan Note (Signed)
No desire to quit. Declines screening CT scan of chest.

## 2015-12-03 NOTE — Assessment & Plan Note (Signed)
Mildly worsened recently. Symptoms are most consistent with allergic rhinitis. We will restart her on Flonase. If this is not beneficial could consider Singulair.

## 2015-12-03 NOTE — Progress Notes (Signed)
Patient ID: Shanasia Ibrahim, female   DOB: 1946/07/31, 69 y.o.   MRN: [id number]  [name], MD Phone: [phone]  Brice Potteiger is a 69 y.o. female who presents today for follow-up.  Allergic rhinitis: Patient notes that she has had increasing rhinorrhea, postnasal drip, and nasal congestion. She notes minimal cough with this. She notes this started since her mother turned the heat up in the house. Notes it at 40F now. She used her humidifier with minimal benefit. She has not had any fevers. She denies shortness of breath. She does not have productive cough. She's not having chest pain. Denies significant sinus pressure. She has not been taking a nasal steroid or a second-generation antihistamine. She's been taking Benadryl as needed. Some Mucinex as well. Minimal benefit with the Benadryl for short period of time.  HYPERLIPIDEMIA Symptoms Chest pain on exertion:  No   Leg claudication:   No Medications: Compliance- taking Crestor Right upper quadrant pain- no  Muscle aches- no Does know she takes enzyme co-Q 10 to help prevent muscle issues with this medicine.  Tobacco abuse: Patient notes she does not want to quit smoking. Notes she has tried multiple medications for this, has been hypnotized, and still has no desire to quit. She has smoked for 55 years. Her father had lung cancer. She does not want a screening CT scan of her chest.  History of back pain: Patient notes long time ago she had issues with pulled muscles in her back. She was on workers comp for a while. She's been on Soma for the past 20 years. Takes 1.5 tablets a day. Does not have back pain at this time. Has not had back pain in some time. Notes the Soma helped significantly. No numbness, weakness, saddle anesthesia, bowel or bladder incontinence, fever, or history of cancer.   Depression: Patient notes this is relatively well controlled on Wellbutrin and Celexa. She notes she does have moments. Notes some minimal anxiety.  Notes she lost 2 husbands in the last 15 years. She notes her depression and anxiety have worsened some in the last 6 months since her mom's moved into her house. She is seeing a psychiatrist. She has no SI or HI.  PMH:  Current smoker   ROS see history of present illness   Objective  Physical Exam Filed Vitals:   12/03/15 1259  BP: 134/86  Pulse: 66  Temp: 98 F (36.7 C)    Physical Exam  Constitutional: She is well-developed, well-nourished, and in no distress.  HENT:  Head: Normocephalic and atraumatic.  Right Ear: External ear normal.  Left Ear: External ear normal.  Mouth/Throat: Oropharynx is clear and moist. No oropharyngeal exudate.  TMs bilaterally, no tenderness of sinuses on percussion  Eyes: Conjunctivae are normal. Pupils are equal, round, and reactive to light.  Neck: Neck supple.  Cardiovascular: Normal rate, regular rhythm and normal heart sounds.  Exam reveals no gallop and no friction rub.   No murmur heard. Pulmonary/Chest: Effort normal and breath sounds normal. No respiratory distress. She has no wheezes. She has no rales.  Abdominal: Soft. Bowel sounds are normal. She exhibits no distension. There is no tenderness. There is no rebound and no guarding.  Musculoskeletal: She exhibits no edema.  Lymphadenopathy:    She has no cervical adenopathy.  Neurological: She is alert.  5 out of 5 strength in bilateral quads, hamstrings, plantar flexion, and dorsiflexion, sensation to light touch intact bilaterally extremities, 2+ patellar reflexes  Skin: Skin is warm  and dry. She is not diaphoretic.  Psychiatric:  Mood mildly depressed     Assessment/Plan: Please see individual problem list.  Hypercholesterolemia Tolerating medication. We'll check lipid panel today.  Back pain Chronic issue. No recent exacerbations. No recent pain. Manuela Neptune has been refilled previously. We'll continue to monitor. Given return precautions.  Allergic rhinitis Mildly worsened  recently. Symptoms are most consistent with allergic rhinitis. We will restart her on Flonase. If this is not beneficial could consider Singulair.  Adjustment disorder with mixed anxiety and depressed mood Stable on current medication regimen. She'll continue to follow with psychiatry. We'll continue Wellbutrin and Celexa. No SI or HI. Discussed that once her life situation is more stable with her mother we could consider tapering her off one of her medicines if she desires. Given return precautions.  Tobacco abuse No desire to quit. Declines screening CT scan of chest.    Orders Placed This Encounter  Procedures  . MM Digital Screening    Standing Status: Future     Number of Occurrences:      Standing Expiration Date: 02/01/2017    Order Specific Question:  Reason for Exam (SYMPTOM  OR DIAGNOSIS REQUIRED)    Answer:  screening mammogram    Order Specific Question:  Preferred imaging location?    Answer:  [facility]  . Comp Met (CM[facility])    Standing Status: Future     Number of Occurrences:      Standing Expiration Date: 12/02/2016  . Lipid Profile    Standing Status: Future     Number of Occurrences:      Standing Expiration Date: 12/02/2016  . HgB A1c    Standing Status: Future     Number of Occurrences:      Standing Expiration Date: 12/02/2016  . Hepatitis C Antibody    Standing Status: Future     Number of Occurrences:      Standing Expiration Date: 12/02/2016    Meds ordered this encounter  Medications  . rosuvastatin (CRESTOR) 40 MG tablet    Sig: Take 1 tablet (40 mg total) by mouth daily.    Dispense:  90 tablet    Refill:  3    Dragon voice recognition software was used during the dictation process of this note. If any phrases or words seem inappropriate it is likely secondary to the translation process being inefficient.  [name]

## 2015-12-03 NOTE — Progress Notes (Signed)
Pre visit review using our clinic review tool, if applicable. No additional management support is needed unless otherwise documented below in the visit note. 

## 2015-12-11 DIAGNOSIS — L720 Epidermal cyst: Secondary | ICD-10-CM | POA: Diagnosis not present

## 2015-12-16 ENCOUNTER — Ambulatory Visit (INDEPENDENT_AMBULATORY_CARE_PROVIDER_SITE_OTHER): Payer: Medicare Other | Admitting: Psychology

## 2015-12-16 DIAGNOSIS — F4323 Adjustment disorder with mixed anxiety and depressed mood: Secondary | ICD-10-CM

## 2015-12-17 ENCOUNTER — Ambulatory Visit: Payer: Medicare Other

## 2015-12-31 ENCOUNTER — Ambulatory Visit: Payer: Self-pay | Admitting: Family Medicine

## 2016-01-06 ENCOUNTER — Ambulatory Visit (INDEPENDENT_AMBULATORY_CARE_PROVIDER_SITE_OTHER): Payer: Medicare Other | Admitting: Psychology

## 2016-01-06 DIAGNOSIS — F4323 Adjustment disorder with mixed anxiety and depressed mood: Secondary | ICD-10-CM | POA: Diagnosis not present

## 2016-01-07 ENCOUNTER — Encounter: Payer: Self-pay | Admitting: Family Medicine

## 2016-01-07 DIAGNOSIS — L0291 Cutaneous abscess, unspecified: Secondary | ICD-10-CM | POA: Diagnosis not present

## 2016-01-07 DIAGNOSIS — L0390 Cellulitis, unspecified: Secondary | ICD-10-CM | POA: Diagnosis not present

## 2016-01-07 DIAGNOSIS — L720 Epidermal cyst: Secondary | ICD-10-CM | POA: Diagnosis not present

## 2016-01-07 DIAGNOSIS — R203 Hyperesthesia: Secondary | ICD-10-CM | POA: Diagnosis not present

## 2016-01-08 ENCOUNTER — Other Ambulatory Visit: Payer: Self-pay | Admitting: Family Medicine

## 2016-01-08 MED ORDER — ATORVASTATIN CALCIUM 40 MG PO TABS: 40.0000 mg | ORAL_TABLET | Freq: Every day | ORAL | Status: DC

## 2016-01-13 ENCOUNTER — Ambulatory Visit: Payer: Self-pay | Admitting: Family Medicine

## 2016-01-25 DIAGNOSIS — M5414 Radiculopathy, thoracic region: Secondary | ICD-10-CM | POA: Diagnosis not present

## 2016-01-25 DIAGNOSIS — M9901 Segmental and somatic dysfunction of cervical region: Secondary | ICD-10-CM | POA: Diagnosis not present

## 2016-01-25 DIAGNOSIS — M9902 Segmental and somatic dysfunction of thoracic region: Secondary | ICD-10-CM | POA: Diagnosis not present

## 2016-01-25 DIAGNOSIS — M5033 Other cervical disc degeneration, cervicothoracic region: Secondary | ICD-10-CM | POA: Diagnosis not present

## 2016-01-26 ENCOUNTER — Other Ambulatory Visit: Payer: Self-pay

## 2016-01-27 ENCOUNTER — Other Ambulatory Visit (INDEPENDENT_AMBULATORY_CARE_PROVIDER_SITE_OTHER): Payer: Medicare Other

## 2016-01-27 DIAGNOSIS — E039 Hypothyroidism, unspecified: Secondary | ICD-10-CM

## 2016-01-27 DIAGNOSIS — Z1159 Encounter for screening for other viral diseases: Secondary | ICD-10-CM | POA: Diagnosis not present

## 2016-01-27 DIAGNOSIS — Z8639 Personal history of other endocrine, nutritional and metabolic disease: Secondary | ICD-10-CM | POA: Diagnosis not present

## 2016-01-27 DIAGNOSIS — E785 Hyperlipidemia, unspecified: Secondary | ICD-10-CM

## 2016-01-27 LAB — COMPREHENSIVE METABOLIC PANEL
ALK PHOS: 81 U/L (ref 39–117)
ALT: 14 U/L (ref 0–35)
AST: 18 U/L (ref 0–37)
Albumin: 4.2 g/dL (ref 3.5–5.2)
BUN: 9 mg/dL (ref 6–23)
CALCIUM: 9.2 mg/dL (ref 8.4–10.5)
CO2: 23 mEq/L (ref 19–32)
Chloride: 99 mEq/L (ref 96–112)
Creatinine, Ser: 0.86 mg/dL (ref 0.40–1.20)
GFR: 69.58 mL/min (ref >60.00)
GLUCOSE: 134 mg/dL — AB (ref 70–99)
POTASSIUM: 4.4 meq/L (ref 3.5–5.1)
Sodium: 131 mEq/L — ABNORMAL LOW (ref 135–145)
TOTAL PROTEIN: 6.7 g/dL (ref 6.0–8.3)
Total Bilirubin: 0.5 mg/dL (ref 0.2–1.2)

## 2016-01-27 LAB — LIPID PANEL
CHOLESTEROL: 222 mg/dL — AB (ref 0–200)
HDL: 55.4 mg/dL (ref >39.00)
LDL Cholesterol: 133 mg/dL — ABNORMAL HIGH (ref 0–99)
NonHDL: 166.2
TRIGLYCERIDES: 164 mg/dL — AB (ref 0.0–149.0)
Total CHOL/HDL Ratio: 4
VLDL: 32.8 mg/dL (ref 0.0–40.0)

## 2016-01-27 LAB — HEMOGLOBIN A1C: Hgb A1c MFr Bld: 6.3 % (ref 4.6–6.5)

## 2016-01-27 LAB — TSH: TSH: 3.34 u[IU]/mL (ref 0.35–4.50)

## 2016-01-28 ENCOUNTER — Other Ambulatory Visit: Payer: Self-pay | Admitting: Family Medicine

## 2016-01-28 ENCOUNTER — Telehealth: Payer: Self-pay | Admitting: *Deleted

## 2016-01-28 DIAGNOSIS — L720 Epidermal cyst: Secondary | ICD-10-CM | POA: Diagnosis not present

## 2016-01-28 DIAGNOSIS — E871 Hypo-osmolality and hyponatremia: Secondary | ICD-10-CM

## 2016-01-28 LAB — HEPATITIS C ANTIBODY: HCV Ab: NEGATIVE

## 2016-01-28 NOTE — Telephone Encounter (Signed)
Patient wanted to [place] that she  scheduled her labs for 02/05/16

## 2016-01-28 NOTE — Telephone Encounter (Signed)
Noted  

## 2016-02-03 ENCOUNTER — Ambulatory Visit (INDEPENDENT_AMBULATORY_CARE_PROVIDER_SITE_OTHER): Payer: Medicare Other | Admitting: Psychology

## 2016-02-03 DIAGNOSIS — F4323 Adjustment disorder with mixed anxiety and depressed mood: Secondary | ICD-10-CM | POA: Diagnosis not present

## 2016-02-05 ENCOUNTER — Other Ambulatory Visit (INDEPENDENT_AMBULATORY_CARE_PROVIDER_SITE_OTHER): Payer: Medicare Other

## 2016-02-05 DIAGNOSIS — E871 Hypo-osmolality and hyponatremia: Secondary | ICD-10-CM

## 2016-02-05 LAB — BASIC METABOLIC PANEL
BUN: 9 mg/dL (ref 6–23)
CALCIUM: 9.5 mg/dL (ref 8.4–10.5)
CHLORIDE: 97 meq/L (ref 96–112)
CO2: 27 meq/L (ref 19–32)
Creatinine, Ser: 0.91 mg/dL (ref 0.40–1.20)
GFR: 65.18 mL/min (ref >60.00)
GLUCOSE: 99 mg/dL (ref 70–99)
Potassium: 5.1 mEq/L (ref 3.5–5.1)
SODIUM: 133 meq/L — AB (ref 135–145)

## 2016-02-08 ENCOUNTER — Ambulatory Visit (INDEPENDENT_AMBULATORY_CARE_PROVIDER_SITE_OTHER): Payer: Medicare Other | Admitting: Pulmonary Disease

## 2016-02-08 ENCOUNTER — Encounter: Payer: Self-pay | Admitting: Pulmonary Disease

## 2016-02-08 VITALS — BP 124/76 | HR 61 | Ht 64.0 in | Wt 165.4 lb

## 2016-02-08 DIAGNOSIS — J449 Chronic obstructive pulmonary disease, unspecified: Secondary | ICD-10-CM | POA: Diagnosis not present

## 2016-02-08 DIAGNOSIS — F17200 Nicotine dependence, unspecified, uncomplicated: Secondary | ICD-10-CM

## 2016-02-08 DIAGNOSIS — Z720 Tobacco use: Secondary | ICD-10-CM

## 2016-02-08 MED ORDER — UMECLIDINIUM BROMIDE 62.5 MCG/INH IN AEPB: 1.0000 | INHALATION_SPRAY | Freq: Every day | RESPIRATORY_TRACT | Status: AC

## 2016-02-10 ENCOUNTER — Ambulatory Visit: Payer: Self-pay | Admitting: Family Medicine

## 2016-02-12 NOTE — Progress Notes (Signed)
PROBLEMS: COPD - FEV1 1.92 (77% pred) in 2013 Smoker - presently 1/2 ppd  INTERVAL HISTORY: Tried varenicline for smoking cessation but was unable to tolerate  SUBJ: Routine ROV for COPD. Still smokes 10 cigs/d. Not using any maintenance meds due to cost and limited benefit. Has minimal exercise limitation and rarely uses albuterol rescue MDI. No significant cough, sputum. No hemoptysis, CP, LE edema   OBJ: Filed Vitals:   02/08/16 1114  BP: 124/76  Pulse: 61  Height: 5\' 4"  (1.626 m)  Weight: 165 lb 6.4 oz (75.025 kg)  SpO2: 96%    Gen: WDWN in NAD HEENT: All WNL Neck: NO LAN, no JVD noted Lungs: mildly diminished BS, normal percussion note no wheezes  Cardiovascular: Reg rate, normal rhythm, no M noted Abdomen: Soft, NT +BS Ext: no C/C/E   DATA: No new data   IMPRESSION: Chronic obstructive pulmonary disease Smoker   PLAN: I again counseled in detail re: importance of smoking cessation Retry Spiriva - we will try to obtain any assistance in affording this med that we can Cont PRN albuterol ROV 6 weeks with PFTs    Wilhelmina Mcardle, MD [facility]/CCM

## 2016-02-15 ENCOUNTER — Other Ambulatory Visit: Payer: Self-pay | Admitting: Surgical

## 2016-02-15 MED ORDER — CARISOPRODOL 350 MG PO TABS: ORAL_TABLET | ORAL | Status: DC

## 2016-02-15 NOTE — Telephone Encounter (Signed)
RX for [employer] sent to pharmacy

## 2016-02-17 ENCOUNTER — Other Ambulatory Visit: Payer: Self-pay | Admitting: Family Medicine

## 2016-02-17 ENCOUNTER — Encounter: Payer: Self-pay | Admitting: Family Medicine

## 2016-02-17 ENCOUNTER — Ambulatory Visit
Admission: RE | Admit: 2016-02-17 | Discharge: 2016-02-17 | Disposition: A | Discharge status: Home / Self Care | Payer: Medicare Other | Source: Ambulatory Visit | Attending: Family Medicine | Admitting: Family Medicine

## 2016-02-17 DIAGNOSIS — Z1231 Encounter for screening mammogram for malignant neoplasm of breast: Secondary | ICD-10-CM

## 2016-02-18 ENCOUNTER — Other Ambulatory Visit: Payer: Self-pay | Admitting: Family Medicine

## 2016-02-18 MED ORDER — TIOTROPIUM BROMIDE MONOHYDRATE 18 MCG IN CAPS: 18.0000 ug | ORAL_CAPSULE | Freq: Every day | RESPIRATORY_TRACT | Status: DC

## 2016-02-22 DIAGNOSIS — M9901 Segmental and somatic dysfunction of cervical region: Secondary | ICD-10-CM | POA: Diagnosis not present

## 2016-02-22 DIAGNOSIS — M5414 Radiculopathy, thoracic region: Secondary | ICD-10-CM | POA: Diagnosis not present

## 2016-02-22 DIAGNOSIS — M5033 Other cervical disc degeneration, cervicothoracic region: Secondary | ICD-10-CM | POA: Diagnosis not present

## 2016-02-22 DIAGNOSIS — M9902 Segmental and somatic dysfunction of thoracic region: Secondary | ICD-10-CM | POA: Diagnosis not present

## 2016-02-26 ENCOUNTER — Ambulatory Visit (INDEPENDENT_AMBULATORY_CARE_PROVIDER_SITE_OTHER): Payer: Medicare Other | Admitting: Psychology

## 2016-02-26 DIAGNOSIS — F4323 Adjustment disorder with mixed anxiety and depressed mood: Secondary | ICD-10-CM | POA: Diagnosis not present

## 2016-03-02 ENCOUNTER — Encounter: Payer: Self-pay | Admitting: Family Medicine

## 2016-03-02 ENCOUNTER — Ambulatory Visit (INDEPENDENT_AMBULATORY_CARE_PROVIDER_SITE_OTHER): Payer: Medicare Other | Admitting: Family Medicine

## 2016-03-02 VITALS — BP 136/82 | HR 68 | Temp 97.3°F | Ht 66.0 in | Wt 168.0 lb

## 2016-03-02 DIAGNOSIS — E871 Hypo-osmolality and hyponatremia: Secondary | ICD-10-CM | POA: Insufficient documentation

## 2016-03-02 DIAGNOSIS — E039 Hypothyroidism, unspecified: Secondary | ICD-10-CM | POA: Diagnosis not present

## 2016-03-02 DIAGNOSIS — L720 Epidermal cyst: Secondary | ICD-10-CM | POA: Diagnosis not present

## 2016-03-02 DIAGNOSIS — I10 Essential (primary) hypertension: Secondary | ICD-10-CM | POA: Diagnosis not present

## 2016-03-02 DIAGNOSIS — M79643 Pain in unspecified hand: Secondary | ICD-10-CM | POA: Insufficient documentation

## 2016-03-02 NOTE — Progress Notes (Signed)
Pre visit review using our clinic review tool, if applicable. No additional management support is needed unless otherwise documented below in the visit note. 

## 2016-03-02 NOTE — Assessment & Plan Note (Signed)
Well healing. No signs of infection. She will continue to monitor. Given return precautions.

## 2016-03-02 NOTE — Patient Instructions (Addendum)
Nice to see you. Continue to monitor the joint pain in your hands. You can continue current regimen. We will continue her current thyroid medication and blood pressure medication. Please continue to monitor the skin area on her back. If this changes please let us know. If you develop headache, nausea, vomiting, decreased awareness, or seizures please seek medical attention.  If you develop swelling, redness, drainage, or any new or changing symptoms please seek medical attention.

## 2016-03-02 NOTE — Assessment & Plan Note (Signed)
Well-controlled. Last TSH in the normal range. Continue current dosing of Synthroid.

## 2016-03-02 NOTE — Assessment & Plan Note (Signed)
Mild. Stable. Could be related to her Celexa. Discussed possibly discontinuing this and monitoring her sodium though the patient opted to continue to monitor sodium. Given return precautions.

## 2016-03-02 NOTE — Progress Notes (Signed)
Patient ID: Jodi Beasley, female   DOB: Apr 12, 1946, 70 y.o.   MRN: [id number]  [name], MD Phone: [phone]  Jodi Beasley is a 70 y.o. female who presents today for a follow-up.  Hand pain: Patient notes she has had discomfort in her bilateral hands specifically the PIP and DIP joints of her fingers for a long time. Notes recently they're bothering her a little more. No joint swelling or erythema. No injury to her hands. Neurologically intact. She's been eating cherry yogurt that she heard this might help and she has had some improvement.  Epidermal cyst: Patient notes she saw dermatologist and had a cyst on her back lanced. This occurred 6 weeks ago. She has had no pain since then. No drainage. She just wants somebody to take a look at it.  HYPOTHYROIDISM Disease Monitoring Weight changes: No  Skin Changes: No Palpitations: No Heat/Cold intolerance: Some heat intolerance, no cold intolerance  Medication Monitoring Compliance:  Taking Synthroid   Last TSH:   Lab Results  Component Value Date   TSH 3.34 01/27/2016   HYPERTENSION Disease Monitoring Home BP Monitoring no Chest pain- no    Dyspnea- no Medications Compliance-  taking metoprolol.      Edema- no  Mild hyponatremia has been noted on her lab work in the past. Celexa is the only medication of note that could be contributing to this. Has been stable recently.  PMH: Smoker  ROS see history of present illness  Objective  Physical Exam Filed Vitals:   03/02/16 1317  BP: 136/82  Pulse: 68  Temp: 97.3 F (36.3 C)    BP Readings from Last 3 Encounters:  03/02/16 136/82  02/08/16 124/76  12/03/15 134/86   Wt Readings from Last 3 Encounters:  03/02/16 168 lb (76.204 kg)  02/08/16 165 lb 6.4 oz (75.025 kg)  12/03/15 168 lb 9.6 oz (76.476 kg)    Physical Exam  Constitutional: She is well-developed, well-nourished, and in no distress.  HENT:  Head: Normocephalic and atraumatic.  Cardiovascular:  Normal rate, regular rhythm and normal heart sounds.  Exam reveals no gallop and no friction rub.   No murmur heard. Pulmonary/Chest: Effort normal and breath sounds normal. No respiratory distress. She has no wheezes. She has no rales.  Musculoskeletal:  Bilateral hands with no joint swelling or erythema, there is mild tenderness of a number of her PIP and DIP joints, full range of motion of her fingers and grip, no anatomic snuffbox tenderness  Neurological: She is alert. Gait normal.  5 out of 5 strength in bilateral grip, sensation to light touch intact in bilateral upper extremities, hands are warm and well-perfused  Skin: Skin is warm and dry. She is not diaphoretic.  Right mid back with well-healing lanced cyst with no tenderness palpation, there is a mild firmness underlying the area that is no erythema or fluctuance     Assessment/Plan: Please see individual problem list.  Hypertension At goal. We'll continue metoprolol.  Hypothyroidism Well-controlled. Last TSH in the normal range. Continue current dosing of Synthroid.  Epidermal cyst Well healing. No signs of infection. She will continue to monitor. Given return precautions.  Hand pain Pain in bilateral hands specifically PIP and DIP joints of her fingers. Suspect osteoarthritis is the cause. No joint swelling or erythema. Discussed Tylenol to help with discomfort. She can continue cherry yogurt. She will continue to monitor. Given return precautions.  Hyponatremia Mild. Stable. Could be related to her Celexa. Discussed possibly discontinuing this and monitoring  her sodium though the patient opted to continue to monitor sodium. Given return precautions.     [name], MD [place]

## 2016-03-02 NOTE — Assessment & Plan Note (Signed)
Pain in bilateral hands specifically PIP and DIP joints of her fingers. Suspect osteoarthritis is the cause. No joint swelling or erythema. Discussed Tylenol to help with discomfort. She can continue cherry yogurt. She will continue to monitor. Given return precautions.

## 2016-03-02 NOTE — Assessment & Plan Note (Signed)
At goal. We'll continue metoprolol.

## 2016-03-10 DIAGNOSIS — H16223 Keratoconjunctivitis sicca, not specified as Sjogren's, bilateral: Secondary | ICD-10-CM | POA: Diagnosis not present

## 2016-03-14 DIAGNOSIS — M9902 Segmental and somatic dysfunction of thoracic region: Secondary | ICD-10-CM | POA: Diagnosis not present

## 2016-03-14 DIAGNOSIS — M5033 Other cervical disc degeneration, cervicothoracic region: Secondary | ICD-10-CM | POA: Diagnosis not present

## 2016-03-14 DIAGNOSIS — M9901 Segmental and somatic dysfunction of cervical region: Secondary | ICD-10-CM | POA: Diagnosis not present

## 2016-03-14 DIAGNOSIS — M5414 Radiculopathy, thoracic region: Secondary | ICD-10-CM | POA: Diagnosis not present

## 2016-03-15 ENCOUNTER — Ambulatory Visit (INDEPENDENT_AMBULATORY_CARE_PROVIDER_SITE_OTHER): Payer: Medicare Other | Admitting: *Deleted

## 2016-03-15 DIAGNOSIS — J449 Chronic obstructive pulmonary disease, unspecified: Secondary | ICD-10-CM

## 2016-03-15 LAB — PULMONARY FUNCTION TEST
DL/VA % PRED: 58 %
DL/VA: 2.81 ml/min/mmHg/L
DLCO UNC % PRED: 53 %
DLCO UNC: 13.08 ml/min/mmHg
FEF 25-75 POST: 1.62 L/s
FEF 25-75 Pre: 0.86 L/sec
FEF2575-%Change-Post: 87 %
FEF2575-%Pred-Post: 82 %
FEF2575-%Pred-Pre: 44 %
FEV1-%CHANGE-POST: 30 %
FEV1-%PRED-PRE: 56 %
FEV1-%Pred-Post: 74 %
FEV1-Post: 1.71 L
FEV1-Pre: 1.31 L
FEV1FVC-%Change-Post: 13 %
FEV1FVC-%PRED-PRE: 81 %
FEV6-%Change-Post: 15 %
FEV6-%PRED-POST: 83 %
FEV6-%PRED-PRE: 72 %
FEV6-POST: 2.43 L
FEV6-PRE: 2.11 L
FEV6FVC-%CHANGE-POST: 0 %
FEV6FVC-%PRED-POST: 104 %
FEV6FVC-%PRED-PRE: 104 %
FVC-%Change-Post: 14 %
FVC-%PRED-PRE: 69 %
FVC-%Pred-Post: 80 %
FVC-POST: 2.43 L
FVC-PRE: 2.12 L
POST FEV6/FVC RATIO: 100 %
PRE FEV6/FVC RATIO: 100 %
Post FEV1/FVC ratio: 70 %
Pre FEV1/FVC ratio: 62 %

## 2016-03-15 NOTE — Progress Notes (Signed)
PFT performed today with nitrogen washout. 

## 2016-03-23 ENCOUNTER — Encounter: Payer: Self-pay | Admitting: Pulmonary Disease

## 2016-03-23 ENCOUNTER — Ambulatory Visit (INDEPENDENT_AMBULATORY_CARE_PROVIDER_SITE_OTHER): Payer: Medicare Other | Admitting: Pulmonary Disease

## 2016-03-23 VITALS — BP 122/68 | HR 67 | Ht 65.0 in | Wt 163.6 lb

## 2016-03-23 DIAGNOSIS — F17200 Nicotine dependence, unspecified, uncomplicated: Secondary | ICD-10-CM

## 2016-03-23 DIAGNOSIS — J449 Chronic obstructive pulmonary disease, unspecified: Secondary | ICD-10-CM

## 2016-03-23 DIAGNOSIS — Z720 Tobacco use: Secondary | ICD-10-CM | POA: Diagnosis not present

## 2016-03-23 MED ORDER — UMECLIDINIUM BROMIDE 62.5 MCG/INH IN AEPB: 1.0000 | INHALATION_SPRAY | Freq: Every day | RESPIRATORY_TRACT | Status: DC

## 2016-03-27 ENCOUNTER — Other Ambulatory Visit: Payer: Self-pay | Admitting: Internal Medicine

## 2016-03-27 MED ORDER — UMECLIDINIUM BROMIDE 62.5 MCG/INH IN AEPB: 1.0000 | INHALATION_SPRAY | Freq: Every day | RESPIRATORY_TRACT | Status: AC

## 2016-03-27 NOTE — Progress Notes (Signed)
PROBLEMS: COPD - FEV1 1.92 (77% pred) in 2013  PFTs 03/15/16: Moderate obstruction FEV1 1.31 liters (56% pred) > 1.71 liters (74% pred) Smoker - presently 1/2 - 1 ppd  INTERVAL HISTORY: No major events  SUBJ: Routine ROV for COPD. Still smokes "< 1 ppd". Still has not gotten approval for Spiriva. Continues to have minimal exercise limitation and rarely uses albuterol rescue MDI. No significant cough, sputum. No hemoptysis, CP, LE edema   OBJ: Filed Vitals:   03/23/16 1022  BP: 122/68  Pulse: 67  Height: 5\' 5"  (1.651 m)  Weight: 163 lb 9.6 oz (74.208 kg)  SpO2: 96%    Gen: WDWN in NAD HEENT: All WNL Neck: NO LAN, no JVD noted Lungs: mildly diminished BS, normal percussion note no wheezes  Cardiovascular: Reg rate, normal rhythm, no M noted Abdomen: Soft, NT +BS Ext: no C/C/E   DATA: No new data   IMPRESSION: Chronic obstructive pulmonary disease Smoker   PLAN: I again counseled in detail re: importance of smoking cessation. She wants to try the strategy of progressively postponing the first cigarette of the day  Trial of Incruse - sample and Rx provided Cont PRN albuterol ROV 6-8 weeks   Merton Border, MD PCCM service Mobile [phone] Pager [phone] 03/27/2016

## 2016-03-30 ENCOUNTER — Ambulatory Visit (INDEPENDENT_AMBULATORY_CARE_PROVIDER_SITE_OTHER): Payer: Medicare Other | Admitting: Psychology

## 2016-03-30 DIAGNOSIS — F4323 Adjustment disorder with mixed anxiety and depressed mood: Secondary | ICD-10-CM

## 2016-04-12 DIAGNOSIS — M5033 Other cervical disc degeneration, cervicothoracic region: Secondary | ICD-10-CM | POA: Diagnosis not present

## 2016-04-12 DIAGNOSIS — M9902 Segmental and somatic dysfunction of thoracic region: Secondary | ICD-10-CM | POA: Diagnosis not present

## 2016-04-12 DIAGNOSIS — M5414 Radiculopathy, thoracic region: Secondary | ICD-10-CM | POA: Diagnosis not present

## 2016-04-12 DIAGNOSIS — M9901 Segmental and somatic dysfunction of cervical region: Secondary | ICD-10-CM | POA: Diagnosis not present

## 2016-04-26 ENCOUNTER — Other Ambulatory Visit: Payer: Self-pay | Admitting: Internal Medicine

## 2016-04-26 ENCOUNTER — Other Ambulatory Visit: Payer: Self-pay | Admitting: Family Medicine

## 2016-04-27 ENCOUNTER — Ambulatory Visit (INDEPENDENT_AMBULATORY_CARE_PROVIDER_SITE_OTHER): Payer: Medicare Other | Admitting: Psychology

## 2016-04-27 DIAGNOSIS — F4323 Adjustment disorder with mixed anxiety and depressed mood: Secondary | ICD-10-CM

## 2016-04-27 NOTE — Telephone Encounter (Signed)
Please advise on refill.

## 2016-04-27 NOTE — Telephone Encounter (Signed)
Refill given

## 2016-05-09 ENCOUNTER — Ambulatory Visit: Payer: Self-pay | Admitting: Pulmonary Disease

## 2016-05-10 DIAGNOSIS — M5033 Other cervical disc degeneration, cervicothoracic region: Secondary | ICD-10-CM | POA: Diagnosis not present

## 2016-05-10 DIAGNOSIS — M9901 Segmental and somatic dysfunction of cervical region: Secondary | ICD-10-CM | POA: Diagnosis not present

## 2016-05-10 DIAGNOSIS — M5414 Radiculopathy, thoracic region: Secondary | ICD-10-CM | POA: Diagnosis not present

## 2016-05-10 DIAGNOSIS — M9902 Segmental and somatic dysfunction of thoracic region: Secondary | ICD-10-CM | POA: Diagnosis not present

## 2016-05-25 ENCOUNTER — Ambulatory Visit (INDEPENDENT_AMBULATORY_CARE_PROVIDER_SITE_OTHER): Payer: Medicare Other | Admitting: Psychology

## 2016-05-25 ENCOUNTER — Other Ambulatory Visit: Payer: Self-pay | Admitting: Internal Medicine

## 2016-05-25 DIAGNOSIS — F4323 Adjustment disorder with mixed anxiety and depressed mood: Secondary | ICD-10-CM

## 2016-05-30 DIAGNOSIS — M5033 Other cervical disc degeneration, cervicothoracic region: Secondary | ICD-10-CM | POA: Diagnosis not present

## 2016-05-30 DIAGNOSIS — M5414 Radiculopathy, thoracic region: Secondary | ICD-10-CM | POA: Diagnosis not present

## 2016-05-30 DIAGNOSIS — M9902 Segmental and somatic dysfunction of thoracic region: Secondary | ICD-10-CM | POA: Diagnosis not present

## 2016-05-30 DIAGNOSIS — M9901 Segmental and somatic dysfunction of cervical region: Secondary | ICD-10-CM | POA: Diagnosis not present

## 2016-06-01 ENCOUNTER — Other Ambulatory Visit: Payer: Self-pay | Admitting: Family Medicine

## 2016-06-01 NOTE — Telephone Encounter (Signed)
Is it OK to refill this

## 2016-06-01 NOTE — Telephone Encounter (Signed)
Ok to refill. She will need an in office visit for further refills.

## 2016-06-06 ENCOUNTER — Encounter: Payer: Self-pay | Admitting: Pulmonary Disease

## 2016-06-06 ENCOUNTER — Ambulatory Visit (INDEPENDENT_AMBULATORY_CARE_PROVIDER_SITE_OTHER): Payer: Medicare Other | Admitting: Pulmonary Disease

## 2016-06-06 VITALS — BP 126/74 | HR 72 | Ht 65.0 in | Wt 162.8 lb

## 2016-06-06 DIAGNOSIS — J449 Chronic obstructive pulmonary disease, unspecified: Secondary | ICD-10-CM

## 2016-06-06 DIAGNOSIS — F17200 Nicotine dependence, unspecified, uncomplicated: Secondary | ICD-10-CM

## 2016-06-06 DIAGNOSIS — Z720 Tobacco use: Secondary | ICD-10-CM | POA: Diagnosis not present

## 2016-06-13 ENCOUNTER — Encounter: Payer: Self-pay | Admitting: Pulmonary Disease

## 2016-06-13 NOTE — Progress Notes (Signed)
PROBLEMS: COPD - FEV1 1.92 (77% pred) in 2013  PFTs 03/15/16: Moderate obstruction FEV1 1.31 liters (56% pred) > 1.71 liters (74% pred) Smoker - presently vaping  INTERVAL HISTORY: No major events  SUBJ: Routine ROV for COPD. Not smoking cigarettes but is vaping. Minimal benefit rom tiotropium. Continues to have minimal exercise limitation and does not use albuterol rescue MDI. No significant cough, sputum. No hemoptysis, CP, LE edema   OBJ: Filed Vitals:   06/06/16 1016  BP: 126/74  Pulse: 72  Height: 5\' 5"  (1.651 m)  Weight: 162 lb 12.8 oz (73.846 kg)  SpO2: 95%    Gen: WDWN in NAD HEENT: All WNL Neck: NO LAN, no JVD noted Lungs: mildly diminished BS, normal percussion note no wheezes  Cardiovascular: Reg rate, normal rhythm, no M noted Abdomen: Soft, NT +BS Ext: no C/C/E   DATA: No new data   IMPRESSION: Mild COPD with minimal exercise limitation Smoker - presently vaping but not smoking cigarettes   PLAN: I again counseled in detail re: importance of smoking cessation. I explained that I think vaping is likely less harmful than cigarette smoking. I expressed my concern that she remains at risk for relapse and resuming smoking cigarettes.   Continue Spiriva if she believes she is benefiting Cont PRN albuterol ROV PRN   Merton Border, MD PCCM service Mobile [phone] Pager [phone] 06/13/2016

## 2016-06-17 ENCOUNTER — Ambulatory Visit (INDEPENDENT_AMBULATORY_CARE_PROVIDER_SITE_OTHER): Payer: Medicare Other | Admitting: Family Medicine

## 2016-06-17 VITALS — BP 120/78 | HR 62 | Temp 97.7°F | Ht 65.0 in | Wt 163.0 lb

## 2016-06-17 DIAGNOSIS — J0100 Acute maxillary sinusitis, unspecified: Secondary | ICD-10-CM

## 2016-06-17 MED ORDER — AMOXICILLIN-POT CLAVULANATE 875-125 MG PO TABS: 1.0000 | ORAL_TABLET | Freq: Two times a day (BID) | ORAL | Status: DC

## 2016-06-17 MED ORDER — AZELASTINE HCL 0.1 % NA SOLN: 1.0000 | Freq: Two times a day (BID) | NASAL | Status: DC

## 2016-06-17 NOTE — Progress Notes (Signed)
Patient ID: Jodi Beasley, female   DOB: [birth year], 70 y.o.   MRN: [id number]  [name], MD Phone: [phone]  Jodi Beasley is a 70 y.o. female who presents today for same-day visit.  Patient notes for the last several weeks she has had upper respiratory issues. Notes rhinorrhea, itchy watery eyes, progressive sinus pressure, nonproductive cough, and postnasal drip. Notes the pressure in her maxillary and frontal sinuses has progressively gotten worse. Has been using Flonase that has been some benefit. Benadryl has not been any benefit.  PMH: Smoking electronic cigarettes   ROS see history of present illness  Objective  Physical Exam Filed Vitals:   06/17/16 1306  BP: 120/78  Pulse: 62  Temp: 97.7 F (36.5 C)    BP Readings from Last 3 Encounters:  06/17/16 120/78  06/06/16 126/74  03/23/16 122/68   Wt Readings from Last 3 Encounters:  06/17/16 163 lb (73.936 kg)  06/06/16 162 lb 12.8 oz (73.846 kg)  03/23/16 163 lb 9.6 oz (74.208 kg)    Physical Exam  Constitutional: No distress.  HENT:  Head: Normocephalic and atraumatic.  Right Ear: External ear normal.  Left Ear: External ear normal.  Mouth/Throat: Oropharynx is clear and moist. No oropharyngeal exudate.  TMs normal, bilateral maxillary sinuses tender to percussion  Eyes: Conjunctivae are normal. Pupils are equal, round, and reactive to light.  Neck: Neck supple.  Cardiovascular: Normal rate, regular rhythm and normal heart sounds.   Pulmonary/Chest: Effort normal and breath sounds normal.  Lymphadenopathy:    She has no cervical adenopathy.  Neurological: She is alert. Gait normal.  Skin: Skin is warm and dry. She is not diaphoretic.     Assessment/Plan: Please see individual problem list.  Sinusitis Patient's symptoms most consistent with sinusitis versus allergic rhinitis. Concern given duration for bacterial sinus infection particularly with axillary sinus tenderness. We will cover for  bacterial infection with Augmentin. She will take a probiotic with this. We will refill her Astelin as well as this is been beneficial in the past. She'll continue Flonase. Should continue to monitor. She's given return precautions.    No orders of the defined types were placed in this encounter.    Meds ordered this encounter  Medications  . azelastine (ASTELIN) 0.1 % nasal spray    Sig: Place 1 spray into both nostrils 2 (two) times daily. Use in each nostril as directed    Dispense:  30 mL    Refill:  1  . amoxicillin-clavulanate (AUGMENTIN) 875-125 MG tablet    Sig: Take 1 tablet by mouth 2 (two) times daily.    Dispense:  14 tablet    Refill:  0    [name], MD [place]

## 2016-06-17 NOTE — Progress Notes (Signed)
Pre visit review using our clinic review tool, if applicable. No additional management support is needed unless otherwise documented below in the visit note. 

## 2016-06-17 NOTE — Assessment & Plan Note (Signed)
Patient's symptoms most consistent with sinusitis versus allergic rhinitis. Concern given duration for bacterial sinus infection particularly with axillary sinus tenderness. We will cover for bacterial infection with Augmentin. She will take a probiotic with this. We will refill her Astelin as well as this is been beneficial in the past. She'll continue Flonase. Should continue to monitor. She's given return precautions.

## 2016-06-17 NOTE — Patient Instructions (Signed)
Nice to see you. Your symptoms could be related to allergies or bacterial sinus infection. We will treat you with Augmentin. We will refill your Astelin. Please continue your Flonase. Please take a probiotic with the antibiotic. If you develop cough productive of blood, fevers, or shortness of breath, or any new or changing symptoms please seek medical attention.

## 2016-06-21 DIAGNOSIS — D1801 Hemangioma of skin and subcutaneous tissue: Secondary | ICD-10-CM | POA: Diagnosis not present

## 2016-06-21 DIAGNOSIS — Z1283 Encounter for screening for malignant neoplasm of skin: Secondary | ICD-10-CM | POA: Diagnosis not present

## 2016-06-21 DIAGNOSIS — L720 Epidermal cyst: Secondary | ICD-10-CM | POA: Diagnosis not present

## 2016-06-21 DIAGNOSIS — D225 Melanocytic nevi of trunk: Secondary | ICD-10-CM | POA: Diagnosis not present

## 2016-06-21 DIAGNOSIS — L821 Other seborrheic keratosis: Secondary | ICD-10-CM | POA: Diagnosis not present

## 2016-06-21 DIAGNOSIS — L812 Freckles: Secondary | ICD-10-CM | POA: Diagnosis not present

## 2016-06-22 ENCOUNTER — Ambulatory Visit (INDEPENDENT_AMBULATORY_CARE_PROVIDER_SITE_OTHER): Payer: Medicare Other | Admitting: Psychology

## 2016-06-22 DIAGNOSIS — F4323 Adjustment disorder with mixed anxiety and depressed mood: Secondary | ICD-10-CM

## 2016-06-28 ENCOUNTER — Other Ambulatory Visit: Payer: Self-pay | Admitting: Internal Medicine

## 2016-07-04 ENCOUNTER — Other Ambulatory Visit: Payer: Self-pay | Admitting: Family Medicine

## 2016-07-04 DIAGNOSIS — M5033 Other cervical disc degeneration, cervicothoracic region: Secondary | ICD-10-CM | POA: Diagnosis not present

## 2016-07-04 DIAGNOSIS — M5414 Radiculopathy, thoracic region: Secondary | ICD-10-CM | POA: Diagnosis not present

## 2016-07-04 DIAGNOSIS — M9902 Segmental and somatic dysfunction of thoracic region: Secondary | ICD-10-CM | POA: Diagnosis not present

## 2016-07-04 DIAGNOSIS — M9901 Segmental and somatic dysfunction of cervical region: Secondary | ICD-10-CM | POA: Diagnosis not present

## 2016-07-04 NOTE — Telephone Encounter (Signed)
RX faxed

## 2016-07-04 NOTE — Telephone Encounter (Signed)
Can we refill this? 

## 2016-07-04 NOTE — Telephone Encounter (Signed)
Refill given. Patient needs an office visit for follow-up on chronic medical issues for further refills of this medication.

## 2016-07-05 ENCOUNTER — Telehealth: Payer: Self-pay | Admitting: Pulmonary Disease

## 2016-07-05 NOTE — Telephone Encounter (Signed)
Pt will cont Spiriva HH and call insurance company and find out what will be covered in place of Symbicort and call me back.

## 2016-07-05 NOTE — Telephone Encounter (Signed)
Please advise on note below. Thanks.

## 2016-07-05 NOTE — Telephone Encounter (Signed)
Pt states she has not taken her Symbicort, due to she cant afford it. She states the assistance program has changed, and she cannot get help anymore. States she is having trouble breathing at night. Please call.

## 2016-07-05 NOTE — Telephone Encounter (Signed)
Per DS, give pt samples of Spiriva Respimat and have her to apply again through drug company for assistance.

## 2016-07-06 ENCOUNTER — Telehealth: Payer: Self-pay | Admitting: Pulmonary Disease

## 2016-07-06 NOTE — Telephone Encounter (Signed)
Pt calling stating she can't afford some of her breathing treatments 1. Symbicort  This one is Tier 3 2.Pro ait  This is also Tier 3 currently she would have to pay $35 each a month.  Pharmacy told her to ask for a tier exemption to bring it down to tier 2  This way it would be $7 a month [id number] Please call this number

## 2016-07-06 NOTE — Telephone Encounter (Signed)
Submitted Tier Exception form thru [employer] for Symbicort. [id number].  Will await response.

## 2016-07-06 NOTE — Telephone Encounter (Signed)
Pt requested Proair be sent in for Tier exception. Sent thru [facility]. Key: [id number]. Will await response.

## 2016-07-07 ENCOUNTER — Telehealth: Payer: Self-pay | Admitting: Pulmonary Disease

## 2016-07-07 MED ORDER — BUDESONIDE-FORMOTEROL FUMARATE 160-4.5 MCG/ACT IN AERO: 2.0000 | INHALATION_SPRAY | Freq: Two times a day (BID) | RESPIRATORY_TRACT | 6 refills | Status: DC

## 2016-07-07 NOTE — Telephone Encounter (Signed)
Tried to call pt to make sure she had no other symptoms but no answer. Please advise on what pt should take for cough. Tier exceptions on her Proair and Symbicort were denied. Pt has stated she can't afford Symbicort or Proair. She is still doing Spiriva HH.

## 2016-07-07 NOTE — Telephone Encounter (Signed)
Received papers that Tier exceptions had been denied for Proair and Symbicort. Tried to call pt but no answer. DS informed. Will call back.

## 2016-07-07 NOTE — Telephone Encounter (Signed)
Pt informed tier exception denied. Nothing further needed.

## 2016-07-07 NOTE — Telephone Encounter (Signed)
Pt calling stating she has a bad cough, been going on for a couple weeks Was not able to get her Symbicort, thinks it may be because of this Please advise.

## 2016-07-07 NOTE — Telephone Encounter (Signed)
Per DS, pt is to take OTC cough medicine such as Robitussin DM. Pt can come pick up samples of Symbicort. Pt informed. Samples left up front for pt to pick up. Nothing further needed.

## 2016-07-21 ENCOUNTER — Encounter: Payer: Self-pay | Admitting: Family Medicine

## 2016-07-21 ENCOUNTER — Ambulatory Visit (INDEPENDENT_AMBULATORY_CARE_PROVIDER_SITE_OTHER): Payer: Medicare Other

## 2016-07-21 ENCOUNTER — Ambulatory Visit (INDEPENDENT_AMBULATORY_CARE_PROVIDER_SITE_OTHER): Payer: Medicare Other | Admitting: Family Medicine

## 2016-07-21 VITALS — BP 118/66 | HR 68 | Temp 98.2°F | Wt 162.0 lb

## 2016-07-21 DIAGNOSIS — J441 Chronic obstructive pulmonary disease with (acute) exacerbation: Secondary | ICD-10-CM | POA: Diagnosis not present

## 2016-07-21 DIAGNOSIS — R0989 Other specified symptoms and signs involving the circulatory and respiratory systems: Secondary | ICD-10-CM | POA: Diagnosis not present

## 2016-07-21 MED ORDER — PREDNISONE 20 MG PO TABS: 40.0000 mg | ORAL_TABLET | Freq: Every day | ORAL | 0 refills | Status: DC

## 2016-07-21 MED ORDER — DOXYCYCLINE HYCLATE 100 MG PO TABS: 100.0000 mg | ORAL_TABLET | Freq: Two times a day (BID) | ORAL | 0 refills | Status: DC

## 2016-07-21 NOTE — Patient Instructions (Signed)
Nice to see you. We are going to treat you for a COPD exacerbation with prednisone and doxycycline. We will obtain a chest x-ray and call you with the results. If you develop chest pain, shortness of breath, cough productive of blood, fevers, or any new or changing symptoms please seek medical attention.

## 2016-07-21 NOTE — Progress Notes (Signed)
  [name], MD Phone: [phone]  Jodi Beasley is a 70 y.o. female who presents today for same-day visit.  Patient notes several days of cough that feels as though it is deep down. Productive of green clear sputum that is slightly more than normal. Some wheezing. Minimal shortness of breath. No fevers. No chest pain. Some rhinorrhea with this. Minimal nasal congestion though no other upper respiratory symptoms. No sick contacts. Has not been using her albuterol any more than usual.  PMH: Continues to use E-cigarettes   ROS see history of present illness  Objective  Physical Exam Vitals:   07/21/16 1308  BP: 118/66  Pulse: 68  Temp: 98.2 F (36.8 C)    BP Readings from Last 3 Encounters:  07/21/16 118/66  06/17/16 120/78  06/06/16 126/74   Wt Readings from Last 3 Encounters:  07/21/16 162 lb (73.5 kg)  06/17/16 163 lb (73.9 kg)  06/06/16 162 lb 12.8 oz (73.8 kg)    Physical Exam  Constitutional: No distress.  HENT:  Head: Normocephalic and atraumatic.  Mouth/Throat: Oropharynx is clear and moist. No oropharyngeal exudate.  Cerumen in bilateral ear canals  Eyes: Conjunctivae are normal. Pupils are equal, round, and reactive to light.  Neck: Neck supple.  Cardiovascular: Normal rate, regular rhythm and normal heart sounds.   Pulmonary/Chest: Effort normal. No respiratory distress. She has wheezes (mild end expiratory wheezes). She has no rales.  Lymphadenopathy:    She has no cervical adenopathy.  Neurological: She is alert. Gait normal.  Skin: Skin is warm and dry. She is not diaphoretic.     Assessment/Plan: Please see individual problem list.  Acute exacerbation of chronic obstructive pulmonary disease (COPD) Symptoms most consistent with COPD exacerbation with increased productive cough and mild shortness of breath. Oxygen saturation is in the normal range. Lung exam with mild wheezes. We will treat for COPD exacerbation with doxycycline and  prednisone. She can use her albuterol inhaler as needed. We will obtain a chest x-ray as well. She is given return precautions.   Orders Placed This Encounter  Procedures  . DG Chest 2 View    Standing Status:   Future    Standing Expiration Date:   09/20/2017    Order Specific Question:   Reason for Exam (SYMPTOM  OR DIAGNOSIS REQUIRED)    Answer:   cough, chest congestion, wheezing, history of COPD    Order Specific Question:   Preferred imaging location?    Answer:   [facility] ordered this encounter  Medications  . predniSONE (DELTASONE) 20 MG tablet    Sig: Take 2 tablets (40 mg total) by mouth daily with breakfast.    Dispense:  10 tablet    Refill:  0  . doxycycline (VIBRA-TABS) 100 MG tablet    Sig: Take 1 tablet (100 mg total) by mouth 2 (two) times daily.    Dispense:  14 tablet    Refill:  0    [name], MD [place]

## 2016-07-21 NOTE — Progress Notes (Signed)
Pre visit review using our clinic review tool, if applicable. No additional management support is needed unless otherwise documented below in the visit note. 

## 2016-07-21 NOTE — Assessment & Plan Note (Signed)
Symptoms most consistent with COPD exacerbation with increased productive cough and mild shortness of breath. Oxygen saturation is in the normal range. Lung exam with mild wheezes. We will treat for COPD exacerbation with doxycycline and prednisone. She can use her albuterol inhaler as needed. We will obtain a chest x-ray as well. She is given return precautions.

## 2016-08-01 DIAGNOSIS — M9901 Segmental and somatic dysfunction of cervical region: Secondary | ICD-10-CM | POA: Diagnosis not present

## 2016-08-01 DIAGNOSIS — M9902 Segmental and somatic dysfunction of thoracic region: Secondary | ICD-10-CM | POA: Diagnosis not present

## 2016-08-01 DIAGNOSIS — M5033 Other cervical disc degeneration, cervicothoracic region: Secondary | ICD-10-CM | POA: Diagnosis not present

## 2016-08-01 DIAGNOSIS — M5414 Radiculopathy, thoracic region: Secondary | ICD-10-CM | POA: Diagnosis not present

## 2016-08-03 ENCOUNTER — Other Ambulatory Visit: Payer: Self-pay

## 2016-08-05 ENCOUNTER — Telehealth: Payer: Self-pay | Admitting: Pulmonary Disease

## 2016-08-05 NOTE — Telephone Encounter (Signed)
Pt states she has been finished with Prednisone and Doxycycline x 1.5 weeks ago. States she wanted to make sure she didn't need to f/u. Informed pt if she wasn't feeling any better or it started again to contact the office for an acute visit. Pt agreed and states she is still doing well since PCP treated. Nothing further needed.

## 2016-08-05 NOTE — Telephone Encounter (Signed)
Patient was recently at [facility] office for acute visit.  She had copd exacerbation and chest xray.  Patient wants to know if she needs to be seen.  Please see pcp note at xray results .

## 2016-08-10 ENCOUNTER — Telehealth: Payer: Self-pay | Admitting: Pulmonary Disease

## 2016-08-10 DIAGNOSIS — M9901 Segmental and somatic dysfunction of cervical region: Secondary | ICD-10-CM | POA: Diagnosis not present

## 2016-08-10 DIAGNOSIS — M9902 Segmental and somatic dysfunction of thoracic region: Secondary | ICD-10-CM | POA: Diagnosis not present

## 2016-08-10 DIAGNOSIS — M5033 Other cervical disc degeneration, cervicothoracic region: Secondary | ICD-10-CM | POA: Diagnosis not present

## 2016-08-10 DIAGNOSIS — M5414 Radiculopathy, thoracic region: Secondary | ICD-10-CM | POA: Diagnosis not present

## 2016-08-10 NOTE — Telephone Encounter (Signed)
Called and spoke with pt and she is requesting to change providers from Dr. Alva Garnet to Dr. Lake Bells.  Please advise. thanks

## 2016-08-11 NOTE — Telephone Encounter (Signed)
OK by me 

## 2016-08-11 NOTE — Telephone Encounter (Signed)
Called and spoke with pt and she is aware of appt scheduled with [facility] on 10-11 at 1030.  Pt is aware.

## 2016-08-11 NOTE — Telephone Encounter (Signed)
Ok

## 2016-08-20 DIAGNOSIS — Z23 Encounter for immunization: Secondary | ICD-10-CM | POA: Diagnosis not present

## 2016-08-24 ENCOUNTER — Ambulatory Visit (INDEPENDENT_AMBULATORY_CARE_PROVIDER_SITE_OTHER): Payer: Medicare Other | Admitting: Psychology

## 2016-08-24 DIAGNOSIS — F4323 Adjustment disorder with mixed anxiety and depressed mood: Secondary | ICD-10-CM | POA: Diagnosis not present

## 2016-08-29 ENCOUNTER — Other Ambulatory Visit: Payer: Self-pay | Admitting: Family Medicine

## 2016-08-29 DIAGNOSIS — M9901 Segmental and somatic dysfunction of cervical region: Secondary | ICD-10-CM | POA: Diagnosis not present

## 2016-08-29 DIAGNOSIS — M9902 Segmental and somatic dysfunction of thoracic region: Secondary | ICD-10-CM | POA: Diagnosis not present

## 2016-08-29 DIAGNOSIS — M5033 Other cervical disc degeneration, cervicothoracic region: Secondary | ICD-10-CM | POA: Diagnosis not present

## 2016-08-29 DIAGNOSIS — M5414 Radiculopathy, thoracic region: Secondary | ICD-10-CM | POA: Diagnosis not present

## 2016-09-02 ENCOUNTER — Ambulatory Visit: Payer: Self-pay | Admitting: Family Medicine

## 2016-09-14 ENCOUNTER — Encounter: Payer: Self-pay | Admitting: Pulmonary Disease

## 2016-09-14 ENCOUNTER — Ambulatory Visit (INDEPENDENT_AMBULATORY_CARE_PROVIDER_SITE_OTHER): Payer: Medicare Other | Admitting: Pulmonary Disease

## 2016-09-14 VITALS — BP 124/66 | HR 62 | Ht 65.0 in | Wt 160.6 lb

## 2016-09-14 DIAGNOSIS — J449 Chronic obstructive pulmonary disease, unspecified: Secondary | ICD-10-CM | POA: Diagnosis not present

## 2016-09-14 MED ORDER — BUDESONIDE-FORMOTEROL FUMARATE 160-4.5 MCG/ACT IN AERO: 2.0000 | INHALATION_SPRAY | Freq: Two times a day (BID) | RESPIRATORY_TRACT | 0 refills | Status: DC

## 2016-09-14 MED ORDER — ALBUTEROL SULFATE (2.5 MG/3ML) 0.083% IN NEBU: 2.5000 mg | INHALATION_SOLUTION | Freq: Four times a day (QID) | RESPIRATORY_TRACT | 11 refills | Status: DC | PRN

## 2016-09-14 NOTE — Progress Notes (Signed)
Subjective:    Patient ID: Jodi Beasley, female    DOB: [birth year], 70 y.o.   MRN: [id number]  Synopsis: Jodi Beasley established care with the [facility] pulmonary clinic in May 2013 for COPD. She simple spirometry at that time with a ratio of 71%, the flow volume loop consistent with obstruction and an FEV1 of 1.92 L or 77% predicted. She was followed previously by Dr. Efraim Kaufmann, a pulmonologist in the [place], [place] area. She had been treated for pneumonia 3 weeks prior to her initial visit.  She has smoked one half pack of cigarettes per day for 53 years and continues to smoke.  HPI Chief Complaint  Patient presents with  . Follow-up    previous BQ pt switched to DS in [place], now switching back to BQ for COPD.  Pt c/o stable SOB with exertion, no other complaints today.     Jodi Beasley says that she has been doing OK, but she experienced an exacerbation of her COPD recently.  She had a deep cough and some dyspnea.  She was seen by her PCP and was given prednisone which made her more depressed (she says severely so), about 6 weeks ago.  She was coughing up mucus at the time.  She had a CXR which was fine.  She also took antibiotics which helped.    She says that she thinks that she is allergic to one of her cats.  This leads to increasing sinus congestion, this will lead to increased cough.    She is taking taking symbicort prn and spiriva daily.    Her breathing is doing OK in general.  She has some dyspnea with heavy activity like gardening and vacuuming and long distance walking (more than a mile).  She is vaping and she has cut down on cigarettes to a severe degree.  She is also taking something called CBD inhaled for her joint aches.  It helps.   Past Medical History:  Diagnosis Date  . Arrhythmia   . Chicken pox   . COPD (chronic obstructive pulmonary disease) ([place])   . Coronary artery disease    NSTEMI in 09/2008. Cath : 80% RCA and 95% first diagonal. PCI and 2 DES  placement  RCA and 1 DES to diagonal. Complicated by acute diagonal stent thrombosis . Treated by thrombectomy. Most recent nuclear stress test in 2010 showed no ischemia with normal EF.   Marland Kitchen Depression   . GERD (gastroesophageal reflux disease)   . Hay fever   . History of blood transfusion   . History of hypercholesterolemia   . History of syncope 2000   negative EP study  . Hyperlipidemia    intolerance to statins except Crestor.   . Hypertension   . Hypothyroid   . MI (myocardial infarction)   . Pneumonia, organism unspecified(486)     Review of Systems  Constitutional: Negative for fatigue, fever and unexpected weight change.  HENT: Negative for congestion, postnasal drip, rhinorrhea and sinus pressure.   Respiratory: Negative for cough, choking and shortness of breath.   Cardiovascular: Negative for chest pain and leg swelling.       Objective:   Physical Exam Vitals:   09/14/16 1025  BP: 124/66  BP Location: Right Arm  Cuff Size: Normal  Pulse: 62  SpO2: 96%  Weight: 160 lb 9.6 oz (72.8 kg)  Height: 5\' 5"  (1.651 m)   room air  Gen: well appearing, no acute distress HEENT: NCAT, EOMi, OP clear,  PULM: Clear  to auscultation bilaterally, decreased air movement CV: RRR, no mgr, no JVD AB: BS+, soft, nontender, no hsm Ext: warm, no edema, no clubbing, no cyanosis   04/09/2012 Simple Spirometry: Ratio 71%, FEV1 1.92L (77% pred); flow volume loop consistent with obstruction 03/2016 PFT> FEV1 1.73 L (74%, > 30% change with bronchodilator)     Assessment & Plan:   COPD (chronic obstructive pulmonary disease) with chronic bronchitis (HCC) She had an exacerbation this year but she has recovered since. She's been experiencing a bit more shortness of breath with exertion. Notably on her pulmonary function testing from this year she had a significant bronchodilator response. She did have some decline compared to her study in 2013 but not to a severe degree.  Plan: Prescribe  albuterol nebulized to use prior to heavy activity or as needed for shortness of breath Continue Symbicort and Spiriva Flu shot is up-to-date Follow-up 4 months  Tobacco abuse She was counseled at length today to stay off of cigarettes altogether but I was encouraged by the fact that she's been able to cut back dramatically through vaping. I did recommend that she try to stop all inhale substances altogether. Discuss lung cancer screening next visit.   Updated Medication List Outpatient Encounter Prescriptions as of 09/14/2016  Medication Sig Dispense Refill  . aspirin 81 MG tablet Take 81 mg by mouth daily.    Marland Kitchen atorvastatin (LIPITOR) 40 MG tablet Take 1 tablet (40 mg total) by mouth daily. 90 tablet 3  . budesonide-formoterol (SYMBICORT) 160-4.5 MCG/ACT inhaler Inhale 2 puffs into the lungs 2 (two) times daily. 1 Inhaler 6  . buPROPion (WELLBUTRIN XL) 150 MG 24 hr tablet TAKE 1 TABLET BY MOUTH DAILY 90 tablet 0  . carisoprodol (SOMA) 350 MG tablet TAKE 1 TABLET BY MOUTH TWICE DAILY AS NEEDED FOR MUSCLE SPASMS 60 tablet 0  . Cholecalciferol (VITAMIN D-3) 1000 UNITS CAPS Take 1 capsule by mouth daily.    . citalopram (CELEXA) 40 MG tablet TAKE 1 TABLET BY MOUTH EVERY DAY 90 tablet 0  . Coenzyme Q10 (COQ10) 400 MG CAPS Take 400 mg by mouth daily.    . diphenhydrAMINE (BENADRYL) 25 MG tablet Take 25 mg by mouth every 6 (six) hours as needed.    . fluticasone (FLONASE) 50 MCG/ACT nasal spray USE TWO SPRAYS EACH NOSTRIL EVERY DAY  (Patient taking differently: prn) 16 g 5  . levothyroxine (SYNTHROID, LEVOTHROID) 112 MCG tablet TAKE 1 TABLET BY MOUTH ONCE DAILY BEFORE BREAKFAST 90 tablet 3  . metoprolol (LOPRESSOR) 100 MG tablet TAKE 1 TABLET BY MOUTH TWICE DAILY 180 tablet 0  . Multiple Vitamin (MULTIVITAMIN) capsule Take 1 capsule by mouth daily.    . nitroGLYCERIN (NITROSTAT) 0.4 MG SL tablet Place 1 tablet (0.4 mg total) under the tongue every 5 (five) minutes as needed. 25 tablet 3  .  omeprazole (PRILOSEC) 20 MG capsule TAKE ONE CAPSULE BY MOUTH ONCE DAILY 90 capsule 3  . tiotropium (SPIRIVA) 18 MCG inhalation capsule Place 1 capsule (18 mcg total) into inhaler and inhale daily. 90 capsule 3  . UNABLE TO FIND Med Name: Sinus and Allergy relief PE Per box directions as needed    . albuterol (PROVENTIL) (2.5 MG/3ML) 0.083% nebulizer solution Take 3 mLs (2.5 mg total) by nebulization every 6 (six) hours as needed for wheezing or shortness of breath. 360 mL 11  . [DISCONTINUED] azelastine (ASTELIN) 0.1 % nasal spray Place 1 spray into both nostrils 2 (two) times daily. Use in each nostril as  directed (Patient not taking: Reported on 09/14/2016) 30 mL 1  . [DISCONTINUED] doxycycline (VIBRA-TABS) 100 MG tablet Take 1 tablet (100 mg total) by mouth 2 (two) times daily. (Patient not taking: Reported on 09/14/2016) 14 tablet 0  . [DISCONTINUED] predniSONE (DELTASONE) 20 MG tablet Take 2 tablets (40 mg total) by mouth daily with breakfast. (Patient not taking: Reported on 09/14/2016) 10 tablet 0   No facility-administered encounter medications on file as of 09/14/2016.

## 2016-09-14 NOTE — Assessment & Plan Note (Signed)
She had an exacerbation this year but she has recovered since. She's been experiencing a bit more shortness of breath with exertion. Notably on her pulmonary function testing from this year she had a significant bronchodilator response. She did have some decline compared to her study in 2013 but not to a severe degree.  Plan: Prescribe albuterol nebulized to use prior to heavy activity or as needed for shortness of breath Continue Symbicort and Spiriva Flu shot is up-to-date Follow-up 4 months

## 2016-09-14 NOTE — Assessment & Plan Note (Signed)
She was counseled at length today to stay off of cigarettes altogether but I was encouraged by the fact that she's been able to cut back dramatically through vaping. I did recommend that she try to stop all inhale substances altogether. Discuss lung cancer screening next visit.

## 2016-09-14 NOTE — Patient Instructions (Signed)
Use albuterol as needed for shortness of breath, chest tightness or wheezing You can use the albuterol 3-4 times a day as needed Keep taking Symbicort and Spiriva Stay off of cigarettes For your allergic rhinitis I recommend the following Use Neil Med rinses with distilled water at least twice per day using the instructions on the package. 1/2 hour after using the [facility] Med rinse, use Nasacort two puffs in each nostril once per day.  Remember that the Nasacort can take 1-2 weeks to work after regular use. Use generic zyrtec (cetirizine) every day.  If this doesn't help, then stop taking it and use chlorpheniramine-phenylephrine combination tablets.   We will see you back in 4 months or sooner if needed

## 2016-09-19 DIAGNOSIS — M5033 Other cervical disc degeneration, cervicothoracic region: Secondary | ICD-10-CM | POA: Diagnosis not present

## 2016-09-19 DIAGNOSIS — M9902 Segmental and somatic dysfunction of thoracic region: Secondary | ICD-10-CM | POA: Diagnosis not present

## 2016-09-19 DIAGNOSIS — M5414 Radiculopathy, thoracic region: Secondary | ICD-10-CM | POA: Diagnosis not present

## 2016-09-19 DIAGNOSIS — M9901 Segmental and somatic dysfunction of cervical region: Secondary | ICD-10-CM | POA: Diagnosis not present

## 2016-09-21 ENCOUNTER — Other Ambulatory Visit: Payer: Self-pay

## 2016-09-21 ENCOUNTER — Ambulatory Visit (INDEPENDENT_AMBULATORY_CARE_PROVIDER_SITE_OTHER): Payer: Medicare Other | Admitting: Psychology

## 2016-09-21 DIAGNOSIS — F4323 Adjustment disorder with mixed anxiety and depressed mood: Secondary | ICD-10-CM

## 2016-09-26 ENCOUNTER — Encounter: Payer: Self-pay | Admitting: Family Medicine

## 2016-09-26 ENCOUNTER — Ambulatory Visit (INDEPENDENT_AMBULATORY_CARE_PROVIDER_SITE_OTHER): Payer: Medicare Other | Admitting: Family Medicine

## 2016-09-26 DIAGNOSIS — I10 Essential (primary) hypertension: Secondary | ICD-10-CM | POA: Diagnosis not present

## 2016-09-26 DIAGNOSIS — E7800 Pure hypercholesterolemia, unspecified: Secondary | ICD-10-CM

## 2016-09-26 DIAGNOSIS — J441 Chronic obstructive pulmonary disease with (acute) exacerbation: Secondary | ICD-10-CM

## 2016-09-26 DIAGNOSIS — G8929 Other chronic pain: Secondary | ICD-10-CM

## 2016-09-26 DIAGNOSIS — M5441 Lumbago with sciatica, right side: Secondary | ICD-10-CM

## 2016-09-26 DIAGNOSIS — I2510 Atherosclerotic heart disease of native coronary artery without angina pectoris: Secondary | ICD-10-CM

## 2016-09-26 DIAGNOSIS — M7541 Impingement syndrome of right shoulder: Secondary | ICD-10-CM | POA: Insufficient documentation

## 2016-09-26 DIAGNOSIS — M5442 Lumbago with sciatica, left side: Secondary | ICD-10-CM

## 2016-09-26 NOTE — Assessment & Plan Note (Signed)
Chronic low back pain. Stable. Neurologically intact in her upper and lower extremities. She'll continue Soma. She'll continue to monitor.

## 2016-09-26 NOTE — Assessment & Plan Note (Signed)
At goal. Continue metoprolol. Follow up with cardiology next week.

## 2016-09-26 NOTE — Assessment & Plan Note (Signed)
Tolerating Lipitor. Continue Lipitor. 

## 2016-09-26 NOTE — Assessment & Plan Note (Signed)
Currently asymptomatic. Encouraged smoking cessation. She declines. She will see her cardiologist next week.

## 2016-09-26 NOTE — Progress Notes (Signed)
[name], MD Phone: [phone]  Jodi Beasley is a 70 y.o. female who presents today for follow-up.  HYPERTENSION Disease Monitoring: Blood pressure range-not checking Chest pain- no      Dyspnea- no Medications: Compliance- taking metoprolol   Edema- no  HYPERLIPIDEMIA Disease Monitoring: See symptoms for Hypertension Medications: Compliance- taking Lipitor Right upper quadrant pain- no  Muscle aches- no  Patient reports she is improved from the last time I saw her for COPD exacerbation. She reports at that time she had some congestion and tightness and had been vaping instead of smoking cigarettes. She notes she has stopped vaping and has not had any symptoms. He is still smoking cigarettes. She did take 2 nitroglycerin at that time and that was beneficial though has not had to take this in the last 6 weeks. She sees her cardiologist next week.  Right shoulder pain: Patient notes off and on for the last year she's had some discomfort in her right shoulder. Notes it hurts when she abducts her arm. No injury. She's been doing a lidocaine and Aspercreme rub with good benefit.  She notes chronic back pain as well is in her low back. In the past it would go out every 3-6 months. More recently she just has baseline mild low back discomfort. She notes this is well-controlled with Soma. Occasionally will get this radiating down her legs and that resolves with chiropractic intervention. She does note occasional numbness in her feet if she sits for too long though no other numbness. Notes no weakness, loss of bowel or bladder function, saddle anesthesia, or fevers.   PMH: Smoker   ROS see history of present illness  Objective  Physical Exam Vitals:   09/26/16 1447  BP: 110/64  Pulse: 66  Temp: 97.7 F (36.5 C)    BP Readings from Last 3 Encounters:  09/26/16 110/64  09/14/16 124/66  07/21/16 118/66   Wt Readings from Last 3 Encounters:  09/26/16 161 lb 6 oz (73.2 kg)   09/14/16 160 lb 9.6 oz (72.8 kg)  07/21/16 162 lb (73.5 kg)    Physical Exam  Constitutional: No distress.  Cardiovascular: Normal rate, regular rhythm and normal heart sounds.   Pulmonary/Chest: Effort normal and breath sounds normal.  Musculoskeletal:  No midline spine tenderness, no midline spine step-off, no muscular back tenderness, patient with symmetric shoulders, no tenderness of the bilateral shoulders, she has discomfort on active and passive internal and external rotation and abduction on the right, no discomfort on active and passive internal and external rotation and abduction on the left, negative empty can bilaterally  Neurological: She is alert. Gait normal.  5/5 strength in bilateral biceps, triceps, grip, quads, hamstrings, plantar and dorsiflexion, sensation to light touch intact in bilateral UE and LE, normal gait, 2+ patellar reflexes  Skin: Skin is warm and dry. She is not diaphoretic.     Assessment/Plan: Please see individual problem list.  Hypertension At goal. Continue metoprolol. Follow up with cardiology next week.  Acute exacerbation of chronic obstructive pulmonary disease (COPD) Resolved with treatment and discontinuing vaping. Encouraged her to quit smoking though she declined at this time. She'll continue to monitor.  Coronary artery disease Currently asymptomatic. Encouraged smoking cessation. She declines. She will see her cardiologist next week.  Back pain Chronic low back pain. Stable. Neurologically intact in her upper and lower extremities. She'll continue Soma. She'll continue to monitor.  Hypercholesterolemia Tolerating Lipitor. Continue Lipitor.  Rotator cuff impingement syndrome of right shoulder Symptoms most  consistent with rotator cuff impingement in the right shoulder. Discussed continuing lidocaine/Aspercreme. Exercises given to complete. Discussed doing an injection in the future if not improving with exercises. Could consider  physical therapy as well.   [name], MD [place]

## 2016-09-26 NOTE — Assessment & Plan Note (Signed)
Symptoms most consistent with rotator cuff impingement in the right shoulder. Discussed continuing lidocaine/Aspercreme. Exercises given to complete. Discussed doing an injection in the future if not improving with exercises. Could consider physical therapy as well.

## 2016-09-26 NOTE — Assessment & Plan Note (Signed)
Resolved with treatment and discontinuing vaping. Encouraged her to quit smoking though she declined at this time. She'll continue to monitor.

## 2016-09-26 NOTE — Progress Notes (Signed)
Pre visit review using our clinic review tool, if applicable. No additional management support is needed unless otherwise documented below in the visit note. 

## 2016-09-26 NOTE — Patient Instructions (Addendum)
Nice to see you. Review be some exercises to do for your shoulder. You can continue to follow with the chiropractor for your back and wrists. If you develop persistent numbness, or you develop weakness, numbness between her legs, loss of bowel or bladder function, fevers, or any new or changing symptoms please seek medical attention medially. Please consider quitting smoking. Please follow-up with your cardiologist next week. When you need a refill on [place] please let us know. If you develop chest pain or shortness breath please seek medical attention medially.   Impingement Syndrome, Rotator Cuff, Bursitis With Rehab Impingement syndrome is a condition that involves inflammation of the tendons of the rotator cuff and the subacromial bursa, that causes pain in the shoulder. The rotator cuff consists of four tendons and muscles that control much of the shoulder and upper arm function. The subacromial bursa is a fluid filled sac that helps reduce friction between the rotator cuff and one of the bones of the shoulder (acromion). Impingement syndrome is usually an overuse injury that causes swelling of the bursa (bursitis), swelling of the tendon (tendonitis), and/or a tear of the tendon (strain). Strains are classified into three categories. Grade 1 strains cause pain, but the tendon is not lengthened. Grade 2 strains include a lengthened ligament, due to the ligament being stretched or partially ruptured. With grade 2 strains there is still function, although the function may be decreased. Grade 3 strains include a complete tear of the tendon or muscle, and function is usually impaired. SYMPTOMS   Pain around the shoulder, often at the outer portion of the upper arm.  Pain that gets worse with shoulder function, especially when reaching overhead or lifting.  Sometimes, aching when not using the arm.  Pain that wakes you up at night.  Sometimes, tenderness, swelling, warmth, or redness over the  affected area.  Loss of strength.  Limited motion of the shoulder, especially reaching behind the back (to the back pocket or to unhook bra) or across your body.  Crackling sound (crepitation) when moving the arm.  Biceps tendon pain and inflammation (in the front of the shoulder). Worse when bending the elbow or lifting. CAUSES  Impingement syndrome is often an overuse injury, in which chronic (repetitive) motions cause the tendons or bursa to become inflamed. A strain occurs when a force is paced on the tendon or muscle that is greater than it can withstand. Common mechanisms of injury include: Stress from sudden increase in duration, frequency, or intensity of training.  Direct hit (trauma) to the shoulder.  Aging, erosion of the tendon with normal use.  Bony bump on shoulder (acromial spur). RISK INCREASES WITH:  Contact sports (football, wrestling, boxing).  Throwing sports (baseball, tennis, volleyball).  Weightlifting and bodybuilding.  Heavy labor.  Previous injury to the rotator cuff, including impingement.  Poor shoulder strength and flexibility.  Failure to warm up properly before activity.  Inadequate protective equipment.  Old age.  Bony bump on shoulder (acromial spur). PREVENTION   Warm up and stretch properly before activity.  Allow for adequate recovery between workouts.  Maintain physical fitness:  Strength, flexibility, and endurance.  Cardiovascular fitness.  Learn and use proper exercise technique. PROGNOSIS  If treated properly, impingement syndrome usually goes away within 6 weeks. Sometimes surgery is required.  RELATED COMPLICATIONS   Longer healing time if not properly treated, or if not given enough time to heal.  Recurring symptoms, that result in a chronic condition.  Shoulder stiffness, frozen shoulder, or  loss of motion.  Rotator cuff tendon tear.  Recurring symptoms, especially if activity is resumed too soon, with  overuse, with a direct blow, or when using poor technique. TREATMENT  Treatment first involves the use of ice and medicine, to reduce pain and inflammation. The use of strengthening and stretching exercises may help reduce pain with activity. These exercises may be performed at home or with a therapist. If non-surgical treatment is unsuccessful after more than 6 months, surgery may be advised. After surgery and rehabilitation, activity is usually possible in 3 months.  MEDICATION  If pain medicine is needed, nonsteroidal anti-inflammatory medicines (aspirin and ibuprofen), or other minor pain relievers (acetaminophen), are often advised.  Do not take pain medicine for 7 days before surgery.  Prescription pain relievers may be given, if your caregiver thinks they are needed. Use only as directed and only as much as you need.  Corticosteroid injections may be given by your caregiver. These injections should be reserved for the most serious cases, because they may only be given a certain number of times. HEAT AND COLD  Cold treatment (icing) should be applied for 10 to 15 minutes every 2 to 3 hours for inflammation and pain, and immediately after activity that aggravates your symptoms. Use ice packs or an ice massage.  Heat treatment may be used before performing stretching and strengthening activities prescribed by your caregiver, physical therapist, or athletic trainer. Use a heat pack or a warm water soak. SEEK MEDICAL CARE IF:   Symptoms get worse or do not improve in 4 to 6 weeks, despite treatment.  New, unexplained symptoms develop. (Drugs used in treatment may produce side effects.) EXERCISES  RANGE OF MOTION (ROM) AND STRETCHING EXERCISES - Impingement Syndrome (Rotator Cuff  Tendinitis, Bursitis) These exercises may help you when beginning to rehabilitate your injury. Your symptoms may go away with or without further involvement from your physician, physical therapist or athletic  trainer. While completing these exercises, remember:   Restoring tissue flexibility helps normal motion to return to the joints. This allows healthier, less painful movement and activity.  An effective stretch should be held for at least 30 seconds.  A stretch should never be painful. You should only feel a gentle lengthening or release in the stretched tissue. STRETCH - Flexion, Standing  Stand with good posture. With an underhand grip on your right / left hand, and an overhand grip on the opposite hand, grasp a broomstick or cane so that your hands are a little more than shoulder width apart.  Keeping your right / left elbow straight and shoulder muscles relaxed, push the stick with your opposite hand, to raise your right / left arm in front of your body and then overhead. Raise your arm until you feel a stretch in your right / left shoulder, but before you have increased shoulder pain.  Try to avoid shrugging your right / left shoulder as your arm rises, by keeping your shoulder blade tucked down and toward your mid-back spine. Hold for __________ seconds.  Slowly return to the starting position. Repeat __________ times. Complete this exercise __________ times per day. STRETCH - Abduction, Supine  Lie on your back. With an underhand grip on your right / left hand and an overhand grip on the opposite hand, grasp a broomstick or cane so that your hands are a little more than shoulder width apart.  Keeping your right / left elbow straight and your shoulder muscles relaxed, push the stick with your opposite  hand, to raise your right / left arm out to the side of your body and then overhead. Raise your arm until you feel a stretch in your right / left shoulder, but before you have increased shoulder pain.  Try to avoid shrugging your right / left shoulder as your arm rises, by keeping your shoulder blade tucked down and toward your mid-back spine. Hold for __________ seconds.  Slowly return to  the starting position. Repeat __________ times. Complete this exercise __________ times per day. ROM - Flexion, Active-Assisted  Lie on your back. You may bend your knees for comfort.  Grasp a broomstick or cane so your hands are about shoulder width apart. Your right / left hand should grip the end of the stick, so that your hand is positioned "thumbs-up," as if you were about to shake hands.  Using your healthy arm to lead, raise your right / left arm overhead, until you feel a gentle stretch in your shoulder. Hold for __________ seconds.  Use the stick to assist in returning your right / left arm to its starting position. Repeat __________ times. Complete this exercise __________ times per day.  ROM - Internal Rotation, Supine   Lie on your back on a firm surface. Place your right / left elbow about 60 degrees away from your side. Elevate your elbow with a folded towel, so that the elbow and shoulder are the same height.  Using a broomstick or cane and your strong arm, pull your right / left hand toward your body until you feel a gentle stretch, but no increase in your shoulder pain. Keep your shoulder and elbow in place throughout the exercise.  Hold for __________ seconds. Slowly return to the starting position. Repeat __________ times. Complete this exercise __________ times per day. STRETCH - Internal Rotation  Place your right / left hand behind your back, palm up.  Throw a towel or belt over your opposite shoulder. Grasp the towel with your right / left hand.  While keeping an upright posture, gently pull up on the towel, until you feel a stretch in the front of your right / left shoulder.  Avoid shrugging your right / left shoulder as your arm rises, by keeping your shoulder blade tucked down and toward your mid-back spine.  Hold for __________ seconds. Release the stretch, by lowering your healthy hand. Repeat __________ times. Complete this exercise __________ times per  day. ROM - Internal Rotation   Using an underhand grip, grasp a stick behind your back with both hands.  While standing upright with good posture, slide the stick up your back until you feel a mild stretch in the front of your shoulder.  Hold for __________ seconds. Slowly return to your starting position. Repeat __________ times. Complete this exercise __________ times per day.  STRETCH - Posterior Shoulder Capsule   Stand or sit with good posture. Grasp your right / left elbow and draw it across your chest, keeping it at the same height as your shoulder.  Pull your elbow, so your upper arm comes in closer to your chest. Pull until you feel a gentle stretch in the back of your shoulder.  Hold for __________ seconds. Repeat __________ times. Complete this exercise __________ times per day. STRENGTHENING EXERCISES - Impingement Syndrome (Rotator Cuff Tendinitis, Bursitis) These exercises may help you when beginning to rehabilitate your injury. They may resolve your symptoms with or without further involvement from your physician, physical therapist or athletic trainer. While completing these exercises,  remember:  Muscles can gain both the endurance and the strength needed for everyday activities through controlled exercises.  Complete these exercises as instructed by your physician, physical therapist or athletic trainer. Increase the resistance and repetitions only as guided.  You may experience muscle soreness or fatigue, but the pain or discomfort you are trying to eliminate should never worsen during these exercises. If this pain does get worse, stop and make sure you are following the directions exactly. If the pain is still present after adjustments, discontinue the exercise until you can discuss the trouble with your clinician.  During your recovery, avoid activity or exercises which involve actions that place your injured hand or elbow above your head or behind your back or head.  These positions stress the tissues which you are trying to heal. STRENGTH - Scapular Depression and Adduction   With good posture, sit on a firm chair. Support your arms in front of you, with pillows, arm rests, or on a table top. Have your elbows in line with the sides of your body.  Gently draw your shoulder blades down and toward your mid-back spine. Gradually increase the tension, without tensing the muscles along the top of your shoulders and the back of your neck.  Hold for __________ seconds. Slowly release the tension and relax your muscles completely before starting the next repetition.  After you have practiced this exercise, remove the arm support and complete the exercise in standing as well as sitting position. Repeat __________ times. Complete this exercise __________ times per day.  STRENGTH - Shoulder Abductors, Isometric  With good posture, stand or sit about 4-6 inches from a wall, with your right / left side facing the wall.  Bend your right / left elbow. Gently press your right / left elbow into the wall. Increase the pressure gradually, until you are pressing as hard as you can, without shrugging your shoulder or increasing any shoulder discomfort.  Hold for __________ seconds.  Release the tension slowly. Relax your shoulder muscles completely before you begin the next repetition. Repeat __________ times. Complete this exercise __________ times per day.  STRENGTH - External Rotators, Isometric  Keep your right / left elbow at your side and bend it 90 degrees.  Step into a door frame so that the outside of your right / left wrist can press against the door frame without your upper arm leaving your side.  Gently press your right / left wrist into the door frame, as if you were trying to swing the back of your hand away from your stomach. Gradually increase the tension, until you are pressing as hard as you can, without shrugging your shoulder or increasing any shoulder  discomfort.  Hold for __________ seconds.  Release the tension slowly. Relax your shoulder muscles completely before you begin the next repetition. Repeat __________ times. Complete this exercise __________ times per day.  STRENGTH - Supraspinatus   Stand or sit with good posture. Grasp a __________ weight, or an exercise band or tubing, so that your hand is "thumbs-up," like you are shaking hands.  Slowly lift your right / left arm in a "V" away from your thigh, diagonally into the space between your side and straight ahead. Lift your hand to shoulder height or as far as you can, without increasing any shoulder pain. At first, many people do not lift their hands above shoulder height.  Avoid shrugging your right / left shoulder as your arm rises, by keeping your shoulder blade tucked  down and toward your mid-back spine.  Hold for __________ seconds. Control the descent of your hand, as you slowly return to your starting position. Repeat __________ times. Complete this exercise __________ times per day.  STRENGTH - External Rotators  Secure a rubber exercise band or tubing to a fixed object (table, pole) so that it is at the same height as your right / left elbow when you are standing or sitting on a firm surface.  Stand or sit so that the secured exercise band is at your uninjured side.  Bend your right / left elbow 90 degrees. Place a folded towel or small pillow under your right / left arm, so that your elbow is a few inches away from your side.  Keeping the tension on the exercise band, pull it away from your body, as if pivoting on your elbow. Be sure to keep your body steady, so that the movement is coming only from your rotating shoulder.  Hold for __________ seconds. Release the tension in a controlled manner, as you return to the starting position. Repeat __________ times. Complete this exercise __________ times per day.  STRENGTH - Internal Rotators   Secure a rubber exercise  band or tubing to a fixed object (table, pole) so that it is at the same height as your right / left elbow when you are standing or sitting on a firm surface.  Stand or sit so that the secured exercise band is at your right / left side.  Bend your elbow 90 degrees. Place a folded towel or small pillow under your right / left arm so that your elbow is a few inches away from your side.  Keeping the tension on the exercise band, pull it across your body, toward your stomach. Be sure to keep your body steady, so that the movement is coming only from your rotating shoulder.  Hold for __________ seconds. Release the tension in a controlled manner, as you return to the starting position. Repeat __________ times. Complete this exercise __________ times per day.  STRENGTH - Scapular Protractors, Standing   Stand arms length away from a wall. Place your hands on the wall, keeping your elbows straight.  Begin by dropping your shoulder blades down and toward your mid-back spine.  To strengthen your protractors, keep your shoulder blades down, but slide them forward on your rib cage. It will feel as if you are lifting the back of your rib cage away from the wall. This is a subtle motion and can be challenging to complete. Ask your caregiver for further instruction, if you are not sure you are doing the exercise correctly.  Hold for __________ seconds. Slowly return to the starting position, resting the muscles completely before starting the next repetition. Repeat __________ times. Complete this exercise __________ times per day. STRENGTH - Scapular Protractors, Supine  Lie on your back on a firm surface. Extend your right / left arm straight into the air while holding a __________ weight in your hand.  Keeping your head and back in place, lift your shoulder off the floor.  Hold for __________ seconds. Slowly return to the starting position, and allow your muscles to relax completely before starting the  next repetition. Repeat __________ times. Complete this exercise __________ times per day. STRENGTH - Scapular Protractors, Quadruped  Get onto your hands and knees, with your shoulders directly over your hands (or as close as you can be, comfortably).  Keeping your elbows locked, lift the back of your rib  cage up into your shoulder blades, so your mid-back rounds out. Keep your neck muscles relaxed.  Hold this position for __________ seconds. Slowly return to the starting position and allow your muscles to relax completely before starting the next repetition. Repeat __________ times. Complete this exercise __________ times per day.  STRENGTH - Scapular Retractors  Secure a rubber exercise band or tubing to a fixed object (table, pole), so that it is at the height of your shoulders when you are either standing, or sitting on a firm armless chair.  With a palm down grip, grasp an end of the band in each hand. Straighten your elbows and lift your hands straight in front of you, at shoulder height. Step back, away from the secured end of the band, until it becomes tense.  Squeezing your shoulder blades together, draw your elbows back toward your sides, as you bend them. Keep your upper arms lifted away from your body throughout the exercise.  Hold for __________ seconds. Slowly ease the tension on the band, as you reverse the directions and return to the starting position. Repeat __________ times. Complete this exercise __________ times per day. STRENGTH - Shoulder Extensors   Secure a rubber exercise band or tubing to a fixed object (table, pole) so that it is at the height of your shoulders when you are either standing, or sitting on a firm armless chair.  With a thumbs-up grip, grasp an end of the band in each hand. Straighten your elbows and lift your hands straight in front of you, at shoulder height. Step back, away from the secured end of the band, until it becomes tense.  Squeezing  your shoulder blades together, pull your hands down to the sides of your thighs. Do not allow your hands to go behind you.  Hold for __________ seconds. Slowly ease the tension on the band, as you reverse the directions and return to the starting position. Repeat __________ times. Complete this exercise __________ times per day.  STRENGTH - Scapular Retractors and External Rotators   Secure a rubber exercise band or tubing to a fixed object (table, pole) so that it is at the height as your shoulders, when you are either standing, or sitting on a firm armless chair.  With a palm down grip, grasp an end of the band in each hand. Bend your elbows 90 degrees and lift your elbows to shoulder height, at your sides. Step back, away from the secured end of the band, until it becomes tense.  Squeezing your shoulder blades together, rotate your shoulders so that your upper arms and elbows remain stationary, but your fists travel upward to head height.  Hold for __________ seconds. Slowly ease the tension on the band, as you reverse the directions and return to the starting position. Repeat __________ times. Complete this exercise __________ times per day.  STRENGTH - Scapular Retractors and External Rotators, Rowing   Secure a rubber exercise band or tubing to a fixed object (table, pole) so that it is at the height of your shoulders, when you are either standing, or sitting on a firm armless chair.  With a palm down grip, grasp an end of the band in each hand. Straighten your elbows and lift your hands straight in front of you, at shoulder height. Step back, away from the secured end of the band, until it becomes tense.  Step 1: Squeeze your shoulder blades together. Bending your elbows, draw your hands to your chest, as if you are  rowing a boat. At the end of this motion, your hands and elbow should be at shoulder height and your elbows should be out to your sides.  Step 2: Rotate your shoulders, to  raise your hands above your head. Your forearms should be vertical and your upper arms should be horizontal.  Hold for __________ seconds. Slowly ease the tension on the band, as you reverse the directions and return to the starting position. Repeat __________ times. Complete this exercise __________ times per day.  STRENGTH - Scapular Depressors  Find a sturdy chair without wheels, such as a dining room chair.  Keeping your feet on the floor, and your hands on the chair arms, lift your bottom up from the seat, and lock your elbows.  Keeping your elbows straight, allow gravity to pull your body weight down. Your shoulders will rise toward your ears.  Raise your body against gravity by drawing your shoulder blades down your back, shortening the distance between your shoulders and ears. Although your feet should always maintain contact with the floor, your feet should progressively support less body weight, as you get stronger.  Hold for __________ seconds. In a controlled and slow manner, lower your body weight to begin the next repetition. Repeat __________ times. Complete this exercise __________ times per day.    This information is not intended to replace advice given to you by your health care provider. Make sure you discuss any questions you have with your health care provider.   Document Released: 11/21/2005 Document Revised: 12/12/2014 Document Reviewed: 03/05/2009 Elsevier Interactive Patient Education [employer].

## 2016-09-27 ENCOUNTER — Other Ambulatory Visit: Payer: Self-pay | Admitting: Family Medicine

## 2016-09-27 NOTE — Telephone Encounter (Signed)
Last refill 08/29/2016 #60, LAST ov 09/26/16, Ok to refill?

## 2016-09-30 ENCOUNTER — Other Ambulatory Visit: Payer: Self-pay | Admitting: Family Medicine

## 2016-09-30 MED ORDER — METOPROLOL TARTRATE 100 MG PO TABS: 100.0000 mg | ORAL_TABLET | Freq: Two times a day (BID) | ORAL | 2 refills | Status: DC

## 2016-09-30 MED ORDER — CITALOPRAM HYDROBROMIDE 40 MG PO TABS: 40.0000 mg | ORAL_TABLET | Freq: Every day | ORAL | 2 refills | Status: DC

## 2016-09-30 MED ORDER — BUPROPION HCL ER (XL) 150 MG PO TB24: 150.0000 mg | ORAL_TABLET | Freq: Every day | ORAL | 2 refills | Status: DC

## 2016-09-30 NOTE — Telephone Encounter (Signed)
Order placed

## 2016-09-30 NOTE — Telephone Encounter (Signed)
Last seen 09/28/16 scripts  previously filled by Dr Gilford Rile

## 2016-09-30 NOTE — Telephone Encounter (Signed)
Pt called requesting refills on metoprolol (LOPRESSOR) 100 MG tablet, citalopram (CELEXA) 40 MG tablet, and buPROPion (WELLBUTRIN XL) 150 MG 24 hr tablet. Pt is completely out of medication.  Pharmacy - [facility], [place] - [address]  Call pt @ [phone]

## 2016-10-04 ENCOUNTER — Ambulatory Visit: Payer: Medicare Other | Admitting: Cardiovascular Disease

## 2016-10-10 DIAGNOSIS — M5414 Radiculopathy, thoracic region: Secondary | ICD-10-CM | POA: Diagnosis not present

## 2016-10-10 DIAGNOSIS — M9902 Segmental and somatic dysfunction of thoracic region: Secondary | ICD-10-CM | POA: Diagnosis not present

## 2016-10-10 DIAGNOSIS — M5033 Other cervical disc degeneration, cervicothoracic region: Secondary | ICD-10-CM | POA: Diagnosis not present

## 2016-10-10 DIAGNOSIS — M9901 Segmental and somatic dysfunction of cervical region: Secondary | ICD-10-CM | POA: Diagnosis not present

## 2016-10-18 DIAGNOSIS — H25013 Cortical age-related cataract, bilateral: Secondary | ICD-10-CM | POA: Diagnosis not present

## 2016-10-18 DIAGNOSIS — H2513 Age-related nuclear cataract, bilateral: Secondary | ICD-10-CM | POA: Diagnosis not present

## 2016-10-18 DIAGNOSIS — H43813 Vitreous degeneration, bilateral: Secondary | ICD-10-CM | POA: Diagnosis not present

## 2016-10-18 DIAGNOSIS — H524 Presbyopia: Secondary | ICD-10-CM | POA: Diagnosis not present

## 2016-10-19 ENCOUNTER — Ambulatory Visit: Payer: Medicare Other | Admitting: Psychology

## 2016-11-01 ENCOUNTER — Telehealth: Payer: Self-pay | Admitting: *Deleted

## 2016-11-01 NOTE — Telephone Encounter (Signed)
Pt stated that she continues to have shoulder pain, she uses asper cream with lidocaine and icing throughout the day. She requested to have a Rx for prednisone to control the inflammation  Pt contact [place]

## 2016-11-01 NOTE — Telephone Encounter (Signed)
Patient needs to be evaluated if she would like to consider prednisone as a treatment. Thanks.

## 2016-11-01 NOTE — Telephone Encounter (Signed)
Please advise 

## 2016-11-02 NOTE — Telephone Encounter (Signed)
Patient is going to call back to schedule appointment

## 2016-11-09 ENCOUNTER — Ambulatory Visit (INDEPENDENT_AMBULATORY_CARE_PROVIDER_SITE_OTHER): Payer: Medicare Other

## 2016-11-09 ENCOUNTER — Ambulatory Visit (INDEPENDENT_AMBULATORY_CARE_PROVIDER_SITE_OTHER): Payer: Medicare Other | Admitting: Family Medicine

## 2016-11-09 ENCOUNTER — Encounter: Payer: Self-pay | Admitting: Family Medicine

## 2016-11-09 VITALS — BP 122/66 | HR 66 | Temp 98.6°F | Wt 160.0 lb

## 2016-11-09 DIAGNOSIS — M25511 Pain in right shoulder: Secondary | ICD-10-CM

## 2016-11-09 DIAGNOSIS — F4323 Adjustment disorder with mixed anxiety and depressed mood: Secondary | ICD-10-CM

## 2016-11-09 DIAGNOSIS — E039 Hypothyroidism, unspecified: Secondary | ICD-10-CM

## 2016-11-09 DIAGNOSIS — I2510 Atherosclerotic heart disease of native coronary artery without angina pectoris: Secondary | ICD-10-CM

## 2016-11-09 DIAGNOSIS — M7541 Impingement syndrome of right shoulder: Secondary | ICD-10-CM | POA: Diagnosis not present

## 2016-11-09 DIAGNOSIS — M19011 Primary osteoarthritis, right shoulder: Secondary | ICD-10-CM | POA: Diagnosis not present

## 2016-11-09 NOTE — Progress Notes (Signed)
Pre visit review using our clinic review tool, if applicable. No additional management support is needed unless otherwise documented below in the visit note. 

## 2016-11-09 NOTE — Assessment & Plan Note (Signed)
Symptoms and exam continued to be most consistent with rotator cuff impingement. She did have a fall with injury to her right shoulder and thus we will obtain an x-ray today. We will send to physical therapy. Once x-ray returns we'll determine the next step in management.

## 2016-11-09 NOTE — Assessment & Plan Note (Addendum)
Stable. Advised to check TSH today though she deferred until her physical in January. Continue Synthroid.

## 2016-11-09 NOTE — Patient Instructions (Signed)
Nice to see you.  We will get an x-ray of your shoulder. We'll get you to see physical therapy. We'll call you with the x-ray results. Please continue to see your psychiatrist. Jodi Beasley plan on checking lab work at her visit in January.

## 2016-11-09 NOTE — Progress Notes (Signed)
[name], MD Phone: [phone]  Jodi Beasley is a 70 y.o. female who presents today for follow-up.  Right shoulder pain: Patient was seen about 6 weeks ago for right shoulder discomfort. Was felt to be rotator cuff in nature. Today she reports she remembers that she fell several weeks prior to our evaluation previously. She did not think to mention this last time she was in the office. She notes with the fall she was unrolling a carpet and saw a bug and then jumped back into a chair. She then went to the ground. She hit her right shoulder on the chair. She notes no head injury or loss of consciousness. No other injury with this. She notes continued discomfort in her shoulder. Notes pain with abduction and external rotation. No numbness or weakness. She's been using lidocaine topically. She did not do the exercises as advised previously.  Anxiety/depression: Patient notes this is stable. Followed by psychiatry. Sees them once a month. Taking Wellbutrin and Celexa. No SI or HI.  Hypothyroidism: Taking Synthroid daily. Weight is relatively stable. No skin changes. Does note some heat intolerance though this is chronic. No cold intolerance.  PMH: Smoker   ROS see history of present illness  Objective  Physical Exam Vitals:   11/09/16 1112  BP: 122/66  Pulse: 66  Temp: 98.6 F (37 C)    BP Readings from Last 3 Encounters:  11/09/16 122/66  09/26/16 110/64  09/14/16 124/66   Wt Readings from Last 3 Encounters:  11/09/16 160 lb (72.6 kg)  09/26/16 161 lb 6 oz (73.2 kg)  09/14/16 160 lb 9.6 oz (72.8 kg)    Physical Exam  Constitutional: No distress.  HENT:  Head: Normocephalic and atraumatic.  Cardiovascular: Normal rate, regular rhythm and normal heart sounds.   Pulmonary/Chest: Effort normal and breath sounds normal.  Musculoskeletal:  Bilateral shoulders with no tenderness or palpated defects, right shoulder with discomfort on active abduction and external  rotation, no discomfort on active internal rotation, minimal discomfort on passive abduction, no discomfort on passive internal or external rotation, left shoulder with full range of motion actively and passively with no discomfort, positive empty can on the right, negative drop arm test bilaterally  Neurological: She is alert.  CN 2-12 intact, 5/5 strength in bilateral biceps, triceps, grip, quads, hamstrings, plantar and dorsiflexion, sensation to light touch intact in bilateral UE and LE, normal gait  Skin: Skin is warm and dry. She is not diaphoretic.     Assessment/Plan: Please see individual problem list.  Adjustment disorder with mixed anxiety and depressed mood Stable. She'll continue to see psychiatry. Continue Wellbutrin and Celexa.  Hypothyroidism Stable. Advised to check TSH today though she deferred until her physical in January. Continue Synthroid.  Rotator cuff impingement syndrome of right shoulder Symptoms and exam continued to be most consistent with rotator cuff impingement. She did have a fall with injury to her right shoulder and thus we will obtain an x-ray today. We will send to physical therapy. Once x-ray returns we'll determine the next step in management.   Orders Placed This Encounter  Procedures  . DG Shoulder Right    Standing Status:   Future    Number of Occurrences:   1    Standing Expiration Date:   01/10/2018    Order Specific Question:   Reason for Exam (SYMPTOM  OR DIAGNOSIS REQUIRED)    Answer:   right shoulder pain, fell and hit right shoulder 1.5 months ago  Order Specific Question:   Preferred imaging location?    Answer:   [facility]  . Ambulatory referral to Physical Therapy    Referral Priority:   Routine    Referral Type:   Physical Medicine    Referral Reason:   Specialty Services Required    Requested Specialty:   Physical Therapy    Number of Visits Requested:   1    [name], MD [place]

## 2016-11-09 NOTE — Assessment & Plan Note (Signed)
Stable. She'll continue to see psychiatry. Continue Wellbutrin and Celexa.

## 2016-11-16 ENCOUNTER — Ambulatory Visit: Payer: Medicare Other | Admitting: Psychology

## 2016-11-21 ENCOUNTER — Telehealth: Payer: Self-pay | Admitting: Family Medicine

## 2016-11-21 NOTE — Telephone Encounter (Signed)
Called patient to notify of labs

## 2016-11-21 NOTE — Telephone Encounter (Signed)
Pt called back returning your call in regards to x ray results. Thank you!  Call pt @ [phone]

## 2016-12-01 ENCOUNTER — Other Ambulatory Visit: Payer: Self-pay | Admitting: Family Medicine

## 2016-12-02 NOTE — Telephone Encounter (Signed)
Refilled 09/27/16. Pt last seen 11/21/16. Please advise?

## 2016-12-02 NOTE — Telephone Encounter (Signed)
faxed

## 2016-12-09 ENCOUNTER — Ambulatory Visit: Payer: Medicare Other | Admitting: Cardiovascular Disease

## 2016-12-12 ENCOUNTER — Ambulatory Visit: Payer: Medicare Other | Admitting: Physical Therapy

## 2016-12-14 ENCOUNTER — Ambulatory Visit: Payer: Self-pay

## 2016-12-15 ENCOUNTER — Encounter: Payer: Medicare Other | Admitting: Physical Therapy

## 2016-12-21 ENCOUNTER — Ambulatory Visit: Payer: Medicare Other | Admitting: Psychology

## 2016-12-21 ENCOUNTER — Encounter: Payer: Self-pay | Admitting: Family Medicine

## 2017-01-03 DIAGNOSIS — M5033 Other cervical disc degeneration, cervicothoracic region: Secondary | ICD-10-CM | POA: Diagnosis not present

## 2017-01-03 DIAGNOSIS — M9901 Segmental and somatic dysfunction of cervical region: Secondary | ICD-10-CM | POA: Diagnosis not present

## 2017-01-03 DIAGNOSIS — M9902 Segmental and somatic dysfunction of thoracic region: Secondary | ICD-10-CM | POA: Diagnosis not present

## 2017-01-03 DIAGNOSIS — M5414 Radiculopathy, thoracic region: Secondary | ICD-10-CM | POA: Diagnosis not present

## 2017-01-10 ENCOUNTER — Encounter: Payer: Self-pay | Admitting: Cardiovascular Disease

## 2017-01-10 ENCOUNTER — Ambulatory Visit (INDEPENDENT_AMBULATORY_CARE_PROVIDER_SITE_OTHER): Payer: Medicare Other | Admitting: Cardiovascular Disease

## 2017-01-10 VITALS — BP 130/84 | HR 58 | Ht 66.0 in | Wt 160.6 lb

## 2017-01-10 DIAGNOSIS — R002 Palpitations: Secondary | ICD-10-CM

## 2017-01-10 DIAGNOSIS — J4489 Other specified chronic obstructive pulmonary disease: Secondary | ICD-10-CM

## 2017-01-10 DIAGNOSIS — I2510 Atherosclerotic heart disease of native coronary artery without angina pectoris: Secondary | ICD-10-CM | POA: Diagnosis not present

## 2017-01-10 DIAGNOSIS — I10 Essential (primary) hypertension: Secondary | ICD-10-CM

## 2017-01-10 DIAGNOSIS — J449 Chronic obstructive pulmonary disease, unspecified: Secondary | ICD-10-CM

## 2017-01-10 DIAGNOSIS — E782 Mixed hyperlipidemia: Secondary | ICD-10-CM

## 2017-01-10 DIAGNOSIS — Z720 Tobacco use: Secondary | ICD-10-CM

## 2017-01-10 MED ORDER — NITROGLYCERIN 0.4 MG SL SUBL: 0.4000 mg | SUBLINGUAL_TABLET | SUBLINGUAL | 3 refills | Status: DC | PRN

## 2017-01-10 NOTE — Patient Instructions (Signed)
Dr Croitoru recommends that you schedule a follow-up appointment in 12 months. You will receive a reminder letter in the mail two months in advance. If you don't receive a letter, please call our office to schedule the follow-up appointment.  If you need a refill on your cardiac medications before your next appointment, please call your pharmacy. 

## 2017-01-10 NOTE — Progress Notes (Addendum)
Cardiology Office Note    Date:  01/12/2017   ID:  Jadden Prioleau, DOB 08-07-1946, MRN [id number]  PCP:  [name], MD  Cardiologist:   Sanda Klein, MD   Chief Complaint  Patient presents with  . Yearly visit    pt c/o SOB--COPD; no other Sx.    History of Present Illness:  Jodi Beasley is a 71 y.o. female with coronary disease, mild sinus node dysfunction, hypertension and hyperlipidemia returning for follow-up.  She has not had any major cardiovascular complaints during the last 12 months. She has intermittent wheezing and unfortunately continues to smoke a pack of cigarettes every day. She has not her orthopnea or PND and denies intermittent claudication, edema, angina pectoris, syncope or palpitations. Her fatigue has improved after we cut back on her beta blocker dose, but she had intolerable palpitations and asked to be placed on the higher dose of metoprolol again.  While taking statins her lipid profile has generally been within the desirable range, but the most recent assay was performed wash was temporarily off her medication (due to musculoskeletal complaints). Musculoskeletal problems appear to be related to rotator cuff impingement, not statin. She is back on the medication.  Roughly 3 years have passed since [name], her longtime partner and also on of our patients, passed away in their home.  She has coronary artery disease. In October 2009 she had a small NSTEMI and received 2 drug-eluting stents (RCA and diagonal artery). Immediately following this she had a small lateral STEMI due to acute diagonal stent thrombosis, treated with thrombectomy and balloon angioplasty. She had a normal nuclear stress test in 2014 and has normal left ventricular systolic function.  Past Medical History:  Diagnosis Date  . Arrhythmia   . Chicken pox   . COPD (chronic obstructive pulmonary disease) ([place])   . Coronary artery disease    NSTEMI in 09/2008. Cath : 80% RCA and  95% first diagonal. PCI and 2 DES placement  RCA and 1 DES to diagonal. Complicated by acute diagonal stent thrombosis . Treated by thrombectomy. Most recent nuclear stress test in 2010 showed no ischemia with normal EF.   Marland Kitchen Depression   . GERD (gastroesophageal reflux disease)   . Hay fever   . History of blood transfusion   . History of hypercholesterolemia   . History of syncope 2000   negative EP study  . Hyperlipidemia    intolerance to statins except Crestor.   . Hypertension   . Hypothyroid   . MI (myocardial infarction)   . Pneumonia, organism unspecified(486)     Past Surgical History:  Procedure Laterality Date  . CORONARY ANGIOPLASTY WITH STENT PLACEMENT  2009   [facility], drug-eluting mid RCA,prox diag;  . TONSILLECTOMY    . TUBAL LIGATION      Current Medications: Outpatient Medications Prior to Visit  Medication Sig Dispense Refill  . albuterol (PROVENTIL) (2.5 MG/3ML) 0.083% nebulizer solution Take 3 mLs (2.5 mg total) by nebulization every 6 (six) hours as needed for wheezing or shortness of breath. 360 mL 11  . aspirin 81 MG tablet Take 81 mg by mouth daily.    Marland Kitchen atorvastatin (LIPITOR) 40 MG tablet Take 1 tablet (40 mg total) by mouth daily. 90 tablet 3  . budesonide-formoterol (SYMBICORT) 160-4.5 MCG/ACT inhaler Inhale 2 puffs into the lungs 2 (two) times daily. 1 Inhaler 6  . budesonide-formoterol (SYMBICORT) 160-4.5 MCG/ACT inhaler Inhale 2 puffs into the lungs 2 (two) times daily. 1 Inhaler 0  .  buPROPion (WELLBUTRIN XL) 150 MG 24 hr tablet Take 1 tablet (150 mg total) by mouth daily. 90 tablet 2  . carisoprodol (SOMA) 350 MG tablet TAKE 1 TABLET BY MOUTH TWICE DAILY AS NEEDED FOR MUSCLE SPASMS 60 tablet 0  . Cholecalciferol (VITAMIN D-3) 1000 UNITS CAPS Take 1 capsule by mouth daily.    . citalopram (CELEXA) 40 MG tablet Take 1 tablet (40 mg total) by mouth daily. 90 tablet 2  . Coenzyme Q10 (COQ10) 400 MG CAPS Take 400 mg by mouth daily.    .  diphenhydrAMINE (BENADRYL) 25 MG tablet Take 25 mg by mouth every 6 (six) hours as needed.    . fluticasone (FLONASE) 50 MCG/ACT nasal spray USE TWO SPRAYS EACH NOSTRIL EVERY DAY  (Patient taking differently: prn) 16 g 5  . levothyroxine (SYNTHROID, LEVOTHROID) 112 MCG tablet TAKE 1 TABLET BY MOUTH ONCE DAILY BEFORE BREAKFAST 90 tablet 3  . metoprolol (LOPRESSOR) 100 MG tablet Take 1 tablet (100 mg total) by mouth 2 (two) times daily. 180 tablet 2  . Multiple Vitamin (MULTIVITAMIN) capsule Take 1 capsule by mouth daily.    Marland Kitchen omeprazole (PRILOSEC) 20 MG capsule TAKE ONE CAPSULE BY MOUTH ONCE DAILY 90 capsule 3  . tiotropium (SPIRIVA) 18 MCG inhalation capsule Place 1 capsule (18 mcg total) into inhaler and inhale daily. 90 capsule 3  . UNABLE TO FIND Med Name: Sinus and Allergy relief PE Per box directions as needed    . nitroGLYCERIN (NITROSTAT) 0.4 MG SL tablet Place 1 tablet (0.4 mg total) under the tongue every 5 (five) minutes as needed. 25 tablet 3   No facility-administered medications prior to visit.      Allergies:   Benzocaine; Darvon [propoxyphene hcl]; Monosodium glutamate; Neosporin [neomycin-bacitracin zn-polymyx]; Nicoderm [nicotine]; and Prednisone   Social History   Social History  . Marital status: Single    Spouse name: N/A  . Number of children: N/A  . Years of education: N/A   Social History Main Topics  . Smoking status: Current Every Day Smoker    Packs/day: 1.00    Years: 53.00    Types: E-cigarettes, Cigarettes    Start date: 11/19/1960  . Smokeless tobacco: Never Used     Comment: smoking 2 cigs daily, uses vape also 09/14/16  . Alcohol use 0.6 oz/week    1 Standard drinks or equivalent per week  . Drug use: No  . Sexual activity: Not Asked   Other Topics Concern  . None   Social History Narrative   Lives with mother in [place]. 2 cats 2 dogs in home.   Recently moved from [facility].   Custody of 50month old.     Family History:  The patient's  family history includes Cancer in her father; Colon cancer in her father; Hypertension in her mother; Lung cancer in her father.   ROS:   Please see the history of present illness.    ROS All other systems reviewed and are negative.   PHYSICAL EXAM:   VS:  BP 130/84 (BP Location: Left Arm, Patient Position: Sitting, Cuff Size: Normal)   Pulse (!) 58   Ht 5\' 6"  (1.676 m)   Wt 72.8 kg (160 lb 9.6 oz)   BMI 25.92 kg/m    GEN: Well nourished, well developed, in no acute distress  HEENT: normal  Neck: no JVD, carotid bruits, or masses Cardiac: RRR; no murmurs, rubs, or gallops,no edema  Respiratory:  clear to auscultation bilaterally, normal work of breathing  GI: soft, nontender, nondistended, + BS MS: no deformity or atrophy  Skin: warm and dry, no rash Neuro:  Alert and Oriented x 3, Strength and sensation are intact Psych: euthymic mood, full affect  Wt Readings from Last 3 Encounters:  01/10/17 72.8 kg (160 lb 9.6 oz)  11/09/16 72.6 kg (160 lb)  09/26/16 73.2 kg (161 lb 6 oz)      Studies/Labs Reviewed:   EKG:  EKG is ordered today.  The ekg ordered today demonstrates Mild sinus bradycardia, otherwise normal, QTC 441 ms  Recent Labs: 01/27/2016: ALT 14; TSH 3.34 02/05/2016: BUN 9; Creatinine, Ser 0.91; Potassium 5.1; Sodium 133   Lipid Panel    Component Value Date/Time   CHOL 222 (H) 01/27/2016 1001   TRIG 164.0 (H) 01/27/2016 1001   HDL 55.40 01/27/2016 1001   CHOLHDL 4 01/27/2016 1001   VLDL 32.8 01/27/2016 1001   LDLCALC 133 (H) 01/27/2016 1001   LDLDIRECT 77.1 11/25/2014 1141    Additional studies/ records that were reviewed today include:  Notes from Dr. Caryl Bis   ASSESSMENT:    1. Essential hypertension   2. Coronary artery disease involving native coronary artery of native heart without angina pectoris   3. Mixed hyperlipidemia   4. Palpitations   5. COPD (chronic obstructive pulmonary disease) with chronic bronchitis ([place])   6. Tobacco abuse       PLAN:  In order of problems listed above:  1. HTN: Adequate control. 2. CAD: Asymptomatic. History of stents in the right coronary artery and diagonal artery in 2009. Normal nuclear stress test in 2014. Most of her risk factors are addressed with the exception of smoking 3. HLP: Back on statin, which has controlled her LDL cholesterol well in the past. Very mild hypertriglyceridemia does not warrant polypharmacy. 4. Palpitations: Previous tracings suggested possible sinoatrial block, versus blocked junctional beats. She is mildly bradycardic but attempts to wean down her beta blocker led to worsening symptoms. TSH was normal last year. 5. COPD: FEV1 in April 2017 was 1.3 L/56% of predicted. Make sure she understands the distinction between the long-acting beta agonist (which should be used schedule twice daily) and her rescue inhaler (albuterol). She reports that often takes the Symbicort only once daily, never more than twice daily. 6. Smoking: She states firmly that she cannot quit smoking and has no plans to try.    Medication Adjustments/Labs and Tests Ordered: Current medicines are reviewed at length with the patient today.  Concerns regarding medicines are outlined above.  Medication changes, Labs and Tests ordered today are listed in the Patient Instructions below. Patient Instructions  Dr Sallyanne Kuster recommends that you schedule a follow-up appointment in 12 months. You will receive a reminder letter in the mail two months in advance. If you don't receive a letter, please call our office to schedule the follow-up appointment.  If you need a refill on your cardiac medications before your next appointment, please call your pharmacy.    Signed, Sanda Klein, MD  01/12/2017 2:50 PM    [address], [place], [place]  [address] Phone: [phone]; Fax: [phone]

## 2017-01-12 ENCOUNTER — Other Ambulatory Visit: Payer: Self-pay | Admitting: Family Medicine

## 2017-01-12 NOTE — Telephone Encounter (Signed)
Refilled 12/02/16. Pt last seen 11/09/16. Please advise? 

## 2017-01-13 NOTE — Telephone Encounter (Signed)
Faxed to [facility].

## 2017-01-18 ENCOUNTER — Ambulatory Visit: Payer: Self-pay | Admitting: Pulmonary Disease

## 2017-01-23 ENCOUNTER — Telehealth: Payer: Self-pay | Admitting: Family Medicine

## 2017-01-23 MED ORDER — OSELTAMIVIR PHOSPHATE 75 MG PO CAPS: 75.0000 mg | ORAL_CAPSULE | Freq: Every day | ORAL | 0 refills | Status: DC

## 2017-01-23 NOTE — Telephone Encounter (Signed)
Patient advised of below  

## 2017-01-23 NOTE — Telephone Encounter (Signed)
Patient has COPD wants script for Tamiflu she message below. Patient is not having any symptoms.  Please advise .

## 2017-01-23 NOTE — Telephone Encounter (Signed)
Tamiflu sent to pharmacy. Please let the patient know she is due for lab work as well and I believe was to have a physical some time around now. Thanks.

## 2017-01-23 NOTE — Telephone Encounter (Signed)
Pt called and stated that granddaughter was diagnosed this morning with the flu. Was advised by granddaughters doctor to call to try and get a prescription for tamiflu because of lung issues. Please advise, thank you.  Call pt @ [phone]

## 2017-01-26 ENCOUNTER — Ambulatory Visit (INDEPENDENT_AMBULATORY_CARE_PROVIDER_SITE_OTHER): Payer: Medicare Other | Admitting: Psychology

## 2017-01-26 DIAGNOSIS — F4323 Adjustment disorder with mixed anxiety and depressed mood: Secondary | ICD-10-CM

## 2017-01-31 DIAGNOSIS — M5033 Other cervical disc degeneration, cervicothoracic region: Secondary | ICD-10-CM | POA: Diagnosis not present

## 2017-01-31 DIAGNOSIS — M9901 Segmental and somatic dysfunction of cervical region: Secondary | ICD-10-CM | POA: Diagnosis not present

## 2017-01-31 DIAGNOSIS — M5414 Radiculopathy, thoracic region: Secondary | ICD-10-CM | POA: Diagnosis not present

## 2017-01-31 DIAGNOSIS — M9902 Segmental and somatic dysfunction of thoracic region: Secondary | ICD-10-CM | POA: Diagnosis not present

## 2017-02-06 ENCOUNTER — Other Ambulatory Visit: Payer: Self-pay | Admitting: Family Medicine

## 2017-02-06 DIAGNOSIS — Z1231 Encounter for screening mammogram for malignant neoplasm of breast: Secondary | ICD-10-CM

## 2017-02-08 ENCOUNTER — Telehealth: Payer: Self-pay | Admitting: Family Medicine

## 2017-02-08 MED ORDER — ATORVASTATIN CALCIUM 40 MG PO TABS: 40.0000 mg | ORAL_TABLET | Freq: Every day | ORAL | 0 refills | Status: DC

## 2017-02-08 NOTE — Telephone Encounter (Signed)
rx sent to pharmacy

## 2017-02-08 NOTE — Telephone Encounter (Signed)
Pt called and is requesting a refill on her atorvastatin (LIPITOR) 40 MG tablet. She is completely out. Please advise, thank you!  Pharmacy - [facility] [place], [place] [address]

## 2017-02-15 ENCOUNTER — Ambulatory Visit (INDEPENDENT_AMBULATORY_CARE_PROVIDER_SITE_OTHER): Payer: Medicare Other

## 2017-02-15 VITALS — BP 122/68 | HR 56 | Temp 98.0°F | Resp 14 | Ht 66.0 in | Wt 162.8 lb

## 2017-02-15 DIAGNOSIS — Z0000 Encounter for general adult medical examination without abnormal findings: Secondary | ICD-10-CM

## 2017-02-15 NOTE — Progress Notes (Signed)
Subjective:   Jodi Beasley is a 71 y.o. female who presents for Medicare Annual (Subsequent) preventive examination.  Review of Systems:  No ROS.  Medicare Wellness Visit.  Cardiac Risk Factors include: advanced age (>64men, >76 women);hypertension     Objective:     Vitals: BP 122/68 (BP Location: Left Arm, Patient Position: Sitting, Cuff Size: Normal)   Pulse (!) 56   Temp 98 F (36.7 C) (Oral)   Resp 14   Ht 5\' 6"  (1.676 m)   Wt 162 lb 12.8 oz (73.8 kg)   SpO2 96%   BMI 26.28 kg/m   Body mass index is 26.28 kg/m.   Tobacco History  Smoking Status  . Current Every Day Smoker  . Packs/day: 1.00  . Years: 53.00  . Types: E-cigarettes, Cigarettes  . Start date: 11/19/1960  Smokeless Tobacco  . Never Used    Comment: smoking 2 cigs daily, uses vape also 09/14/16     Ready to quit: No Counseling given: Yes   Past Medical History:  Diagnosis Date  . Arrhythmia   . Chicken pox   . COPD (chronic obstructive pulmonary disease) ([place])   . Coronary artery disease    NSTEMI in 09/2008. Cath : 80% RCA and 95% first diagonal. PCI and 2 DES placement  RCA and 1 DES to diagonal. Complicated by acute diagonal stent thrombosis . Treated by thrombectomy. Most recent nuclear stress test in 2010 showed no ischemia with normal EF.   Marland Kitchen Depression   . GERD (gastroesophageal reflux disease)   . Hay fever   . History of blood transfusion   . History of hypercholesterolemia   . History of syncope 2000   negative EP study  . Hyperlipidemia    intolerance to statins except Crestor.   . Hypertension   . Hypothyroid   . MI (myocardial infarction)   . Pneumonia, organism unspecified(486)    Past Surgical History:  Procedure Laterality Date  . CORONARY ANGIOPLASTY WITH STENT PLACEMENT  2009   [facility], drug-eluting mid RCA,prox diag;  . TONSILLECTOMY    . TUBAL LIGATION     Family History  Problem Relation Age of Onset  . Colon cancer Father   . Lung cancer Father       was a former smoker  . Cancer Father     lung  . Hypertension Mother   . Hypertension    . Diabetes     History  Sexual Activity  . Sexual activity: Yes    Outpatient Encounter Prescriptions as of 02/15/2017  Medication Sig  . aspirin 81 MG tablet Take 81 mg by mouth daily.  Marland Kitchen atorvastatin (LIPITOR) 40 MG tablet Take 1 tablet (40 mg total) by mouth daily.  . budesonide-formoterol (SYMBICORT) 160-4.5 MCG/ACT inhaler Inhale 2 puffs into the lungs 2 (two) times daily.  Marland Kitchen buPROPion (WELLBUTRIN XL) 150 MG 24 hr tablet Take 1 tablet (150 mg total) by mouth daily.  . carisoprodol (SOMA) 350 MG tablet TAKE 1 TABLET BY MOUTH TWICE DAILY AS NEEDED FOR MUSCLE SPASMS  . Cholecalciferol (VITAMIN D-3) 1000 UNITS CAPS Take 1 capsule by mouth daily.  . citalopram (CELEXA) 40 MG tablet Take 1 tablet (40 mg total) by mouth daily.  . Coenzyme Q10 (COQ10) 400 MG CAPS Take 400 mg by mouth daily.  . diphenhydrAMINE (BENADRYL) 25 MG tablet Take 25 mg by mouth every 6 (six) hours as needed.  . fluticasone (FLONASE) 50 MCG/ACT nasal spray USE TWO SPRAYS EACH NOSTRIL EVERY DAY  (  Patient taking differently: prn)  . levothyroxine (SYNTHROID, LEVOTHROID) 112 MCG tablet TAKE 1 TABLET BY MOUTH ONCE DAILY BEFORE BREAKFAST  . metoprolol (LOPRESSOR) 100 MG tablet Take 1 tablet (100 mg total) by mouth 2 (two) times daily.  . Multiple Vitamin (MULTIVITAMIN) capsule Take 1 capsule by mouth daily.  . nitroGLYCERIN (NITROSTAT) 0.4 MG SL tablet Place 1 tablet (0.4 mg total) under the tongue every 5 (five) minutes as needed.  Marland Kitchen omeprazole (PRILOSEC) 20 MG capsule TAKE ONE CAPSULE BY MOUTH ONCE DAILY  . tiotropium (SPIRIVA HANDIHALER) 18 MCG inhalation capsule Place 18 mcg into inhaler and inhale daily.  Marland Kitchen UNABLE TO FIND Med Name: Sinus and Allergy relief PE Per box directions as needed  . [DISCONTINUED] budesonide-formoterol (SYMBICORT) 160-4.5 MCG/ACT inhaler Inhale 2 puffs into the lungs 2 (two) times daily.  .  [DISCONTINUED] oseltamivir (TAMIFLU) 75 MG capsule Take 1 capsule (75 mg total) by mouth daily.  . [DISCONTINUED] tiotropium (SPIRIVA) 18 MCG inhalation capsule Place 1 capsule (18 mcg total) into inhaler and inhale daily.  Marland Kitchen albuterol (PROVENTIL) (2.5 MG/3ML) 0.083% nebulizer solution Take 3 mLs (2.5 mg total) by nebulization every 6 (six) hours as needed for wheezing or shortness of breath. (Patient not taking: Reported on 02/15/2017)   No facility-administered encounter medications on file as of 02/15/2017.     Activities of Daily Living In your present state of health, do you have any difficulty performing the following activities: 02/15/2017  Hearing? N  Vision? N  Difficulty concentrating or making decisions? N  Walking or climbing stairs? N  Dressing or bathing? N  Doing errands, shopping? N  Preparing Food and eating ? N  Using the Toilet? N  In the past six months, have you accidently leaked urine? Y  Do you have problems with loss of bowel control? N  Managing your Medications? N  Managing your Finances? N  Housekeeping or managing your Housekeeping? N  Some recent data might be hidden    Patient Care Team: Leone Haven, MD as PCP - General (Family Medicine)    Assessment:    This is a routine wellness examination for [name]. The goal of the wellness visit is to assist the patient how to close the gaps in care and create a preventative care plan for the patient.   Taking calcium VIT D3 as appropriate/Osteoporosis risk reviewed.  Medications reviewed; taking without issues or barriers.  Safety issues reviewed; smoke and carbon monoxide detectors in the home. No firearms in the home.  Wears seatbelts when driving or riding with others. Patient does wear sunscreen or protective clothing when in direct sunlight. No violence in the home.  Patient is alert, normal appearance, oriented to person/place/and time. Correctly identified the president of the [place], recall of  3/3 objects, and performing simple calculations.  Patient displays appropriate judgement and can read correct time from watch face.  No new identified risk were noted.  No failures at ADL's or IADL's.   BMI- discussed the importance of a healthy diet, water intake and exercise. Educational material provided.   Diet: Breakfast: Yogurt, fruit Lunch: Lean meat, vegetables Dinner: Yogurt, toast Daily fluid intake: 2 cups of caffeine, 4 cups of water  HTN- followed by PCP.  Dental- every six months.  Dr. Marilynne Halsted.  Eye- Visual acuity not assessed per patient preference since they have regular follow up with the ophthalmologist.  Wears corrective lenses.  Sleep patterns- Sleeps 8 hours at night.  Wakes feeling rested.  TDAP vaccine  deferred per patient preference.    Health maintenance gaps- closed.  Patient Concerns: Recent referral for shoulder pain is no longer needed.  She cancelled the appointment and states her shoulder feels much better after no longer lifting cases of bottled water.    Exercise Activities and Dietary recommendations Current Exercise Habits: The patient does not participate in regular exercise at present  Goals    . Increase physical activity      Fall Risk Fall Risk  02/15/2017 08/03/2016 11/25/2014 11/19/2013 12/10/2012  Falls in the past year? Yes No No No No  Number falls in past yr: 2 or more - - - -  Injury with Fall? Yes - - - -  Follow up Falls prevention discussed - - - -   Depression Screen PHQ 2/9 Scores 02/15/2017 12/03/2015 11/25/2014 11/19/2013  PHQ - 2 Score 2 2 0 0  PHQ- 9 Score 3 4 - -     Cognitive Function MMSE - Mini Mental State Exam 02/15/2017  Orientation to time 5  Orientation to Place 5  Registration 3  Attention/ Calculation 5  Recall 3  Language- name 2 objects 2  Language- repeat 1  Language- follow 3 step command 3  Language- read & follow direction 1  Write a sentence 1  Copy design 1  Total score 30          Immunization History  Administered Date(s) Administered  . Influenza Split 10/22/2012, 09/20/2014, 09/21/2015, 08/15/2016  . Influenza Whole 10/17/2013  . Influenza-Unspecified 09/05/2015  . Pneumococcal Conjugate-13 12/06/2007  . Pneumococcal Polysaccharide-23 11/19/2013  . Td 05/05/2010  . Zoster 04/20/2011   Screening Tests Health Maintenance  Topic Date Due  . TETANUS/TDAP  03/05/2019 (Originally 04/04/1965)  . MAMMOGRAM  02/16/2018  . COLONOSCOPY  04/19/2020  . INFLUENZA VACCINE  Addressed  . DEXA SCAN  Completed  . Hepatitis C Screening  Completed  . PNA vac Low Risk Adult  Completed      Plan:    End of life planning; Advance aging; Advanced directives discussed. Copy of current HCPOA/Living Will requested.    Medicare Attestation I have personally reviewed: The patient's medical and social history Their use of alcohol, tobacco or illicit drugs-not ready to quit smoking. Their current medications and supplements The patient's functional ability including ADLs,fall risks, home safety risks, cognitive, and hearing and visual impairment Diet and physical activities-healthy diet.  Plans to start horse back riding. Evidence for depression-currently in counseling.  The patient's weight, height, BMI, and visual acuity have been recorded in the chart.  I have made referrals and provided education to the patient based on review of the above and I have provided the patient with a written personalized care plan for preventive services.    During the course of the visit the patient was educated and counseled about the following appropriate screening and preventive services:   Vaccines to include Pneumoccal, Influenza, Hepatitis B, Td, Zostavax, HCV  Colorectal cancer screening-UTD  Bone density screening-UTD  Glaucoma screening-annual eye exam  Mammography-UTD  Nutrition counseling   Patient Instructions (the written plan) was given to the patient.    Varney Biles, LPN  [id number]

## 2017-02-15 NOTE — Patient Instructions (Addendum)
  Jodi Beasley , Thank you for taking time to come for your Medicare Wellness Visit. I appreciate your ongoing commitment to your health goals. Please review the following plan we discussed and let me know if I can assist you in the future.   Follow up with Dr. Caryl Bis as needed.    Bring a copy of your [place] and/or Living Will to be scanned into chart.  Have a great day!  These are the goals we discussed: Goals    . Increase physical activity       This is a list of the screening recommended for you and due dates:  Health Maintenance  Topic Date Due  . Tetanus Vaccine  03/05/2019*  . Mammogram  02/16/2018  . Colon Cancer Screening  04/19/2020  . Flu Shot  Addressed  . DEXA scan (bone density measurement)  Completed  .  Hepatitis C: One time screening is recommended by Center for Disease Control  (CDC) for  adults born from [birth year] through 1964.   Completed  . Pneumonia vaccines  Completed  *Topic was postponed. The date shown is not the original due date.

## 2017-02-19 NOTE — Progress Notes (Signed)
I have reviewed the above note and agree.  Raymie Giammarco, M.D.  

## 2017-02-21 DIAGNOSIS — M5033 Other cervical disc degeneration, cervicothoracic region: Secondary | ICD-10-CM | POA: Diagnosis not present

## 2017-02-21 DIAGNOSIS — M9902 Segmental and somatic dysfunction of thoracic region: Secondary | ICD-10-CM | POA: Diagnosis not present

## 2017-02-21 DIAGNOSIS — M9901 Segmental and somatic dysfunction of cervical region: Secondary | ICD-10-CM | POA: Diagnosis not present

## 2017-02-21 DIAGNOSIS — M5414 Radiculopathy, thoracic region: Secondary | ICD-10-CM | POA: Diagnosis not present

## 2017-02-23 ENCOUNTER — Ambulatory Visit (INDEPENDENT_AMBULATORY_CARE_PROVIDER_SITE_OTHER): Payer: Medicare Other | Admitting: Family Medicine

## 2017-02-23 ENCOUNTER — Encounter: Payer: Self-pay | Admitting: Family Medicine

## 2017-02-23 ENCOUNTER — Other Ambulatory Visit: Payer: Self-pay | Admitting: Family Medicine

## 2017-02-23 VITALS — BP 126/80 | HR 63 | Temp 98.6°F | Wt 162.6 lb

## 2017-02-23 DIAGNOSIS — W19XXXA Unspecified fall, initial encounter: Secondary | ICD-10-CM

## 2017-02-23 DIAGNOSIS — J4489 Other specified chronic obstructive pulmonary disease: Secondary | ICD-10-CM

## 2017-02-23 DIAGNOSIS — E039 Hypothyroidism, unspecified: Secondary | ICD-10-CM | POA: Diagnosis not present

## 2017-02-23 DIAGNOSIS — E785 Hyperlipidemia, unspecified: Secondary | ICD-10-CM

## 2017-02-23 DIAGNOSIS — I2510 Atherosclerotic heart disease of native coronary artery without angina pectoris: Secondary | ICD-10-CM | POA: Diagnosis not present

## 2017-02-23 DIAGNOSIS — J449 Chronic obstructive pulmonary disease, unspecified: Secondary | ICD-10-CM | POA: Diagnosis not present

## 2017-02-23 DIAGNOSIS — F4323 Adjustment disorder with mixed anxiety and depressed mood: Secondary | ICD-10-CM

## 2017-02-23 LAB — LIPID PANEL
Cholesterol: 249 mg/dL — ABNORMAL HIGH (ref 0–200)
HDL: 54.4 mg/dL (ref >39.00)
NONHDL: 194.88
Total CHOL/HDL Ratio: 5
Triglycerides: 222 mg/dL — ABNORMAL HIGH (ref 0.0–149.0)
VLDL: 44.4 mg/dL — ABNORMAL HIGH (ref 0.0–40.0)

## 2017-02-23 LAB — TSH: TSH: 5.69 u[IU]/mL — ABNORMAL HIGH (ref 0.35–4.50)

## 2017-02-23 LAB — COMPREHENSIVE METABOLIC PANEL
ALT: 20 U/L (ref 0–35)
AST: 21 U/L (ref 0–37)
Albumin: 4.4 g/dL (ref 3.5–5.2)
Alkaline Phosphatase: 82 U/L (ref 39–117)
BUN: 10 mg/dL (ref 6–23)
CHLORIDE: 102 meq/L (ref 96–112)
CO2: 27 meq/L (ref 19–32)
Calcium: 9.7 mg/dL (ref 8.4–10.5)
Creatinine, Ser: 0.84 mg/dL (ref 0.40–1.20)
GFR: 71.27 mL/min (ref >60.00)
GLUCOSE: 92 mg/dL (ref 70–99)
POTASSIUM: 4.9 meq/L (ref 3.5–5.1)
Sodium: 135 mEq/L (ref 135–145)
Total Bilirubin: 0.5 mg/dL (ref 0.2–1.2)
Total Protein: 7 g/dL (ref 6.0–8.3)

## 2017-02-23 LAB — LDL CHOLESTEROL, DIRECT: LDL DIRECT: 153 mg/dL

## 2017-02-23 NOTE — Assessment & Plan Note (Signed)
Asymptomatic. Encouraged smoking cessation. She declined. She'll continue metoprolol, Lipitor, and aspirin. Lab work today.

## 2017-02-23 NOTE — Patient Instructions (Signed)
Nice to see you. Please monitor your knee and please try to be careful when walking. We'll check lab work and contact you with the results.

## 2017-02-23 NOTE — Assessment & Plan Note (Signed)
Patient is doing quite well on Spiriva. Has not been using Symbicort and has had no increase in symptoms. No albuterol use. She'll continue Spiriva and continue to follow with pulmonology.

## 2017-02-23 NOTE — Progress Notes (Signed)
  [name], MD Phone: [phone]  Jodi Beasley is a 71 y.o. female who presents today for f/u.  COPD: Patient is currently taking Spiriva. She notes minimal cough at times. No wheezing. No shortness of breath. Has not been using albuterol at all. She stopped using the Symbicort and since then has not had any issues with bronchitis.  CAD: Followed by cardiology. Currently taking Lipitor and metoprolol. Also on aspirin. No chest pain or shortness of breath.  Anxiety/depression: Patient notes no depression. Notes her mother died recently and she is coping fairly well with this. She notes she just feels as though she is cruising before making any decisions. She sees a therapist once a month and is on Wellbutrin.  She reports shortly after her mother died she tripped over something on the floor and fell and hit her knee on the left side and also landed on the left side of her face. She notes no loss of consciousness. No headaches. Notes the knee is minimally sore. No pain to her face at this time.  PMH: Smoker   ROS see history of present illness  Objective  Physical Exam Vitals:   02/23/17 1059  BP: 126/80  Pulse: 63  Temp: 98.6 F (37 C)    BP Readings from Last 3 Encounters:  02/23/17 126/80  02/15/17 122/68  01/10/17 130/84   Wt Readings from Last 3 Encounters:  02/23/17 162 lb 9.6 oz (73.8 kg)  02/15/17 162 lb 12.8 oz (73.8 kg)  01/10/17 160 lb 9.6 oz (72.8 kg)    Physical Exam  Constitutional: No distress.  HENT:  Head: Normocephalic and atraumatic.  Cerumen in bilateral ears, irrigation attempted by CMA though cerumen remained  Cardiovascular: Normal rate, regular rhythm and normal heart sounds.   Pulmonary/Chest: Effort normal and breath sounds normal.  Musculoskeletal:  Left knee with minimal soreness of the skin overlying the patella, no bony defects noted, no joint line tenderness, no swelling, no warmth, no erythema, no ligamentous laxity, negative  McMurray's, no palpable abnormalities of the forehead or eye sockets, no tenderness of the forehead or eye sockets  Neurological: She is alert. Gait normal.  CN 2-12 intact, 5/5 strength in bilateral biceps, triceps, grip, quads, hamstrings, plantar and dorsiflexion, sensation to light touch intact in bilateral UE and LE  Skin: Skin is warm and dry. She is not diaphoretic.     Assessment/Plan: Please see individual problem list.  Coronary artery disease Asymptomatic. Encouraged smoking cessation. She declined. She'll continue metoprolol, Lipitor, and aspirin. Lab work today.  COPD (chronic obstructive pulmonary disease) with chronic bronchitis ([place]) Patient is doing quite well on Spiriva. Has not been using Symbicort and has had no increase in symptoms. No albuterol use. She'll continue Spiriva and continue to follow with pulmonology.  Adjustment disorder with mixed anxiety and depressed mood Pretty stable at this time. She'll continue to follow with the therapist. Continue current medications.  Fall Patient with mechanical fall several weeks ago. Seems to be recovering well. Mild soreness of her knee. No bony defects of her forehead or her eye sockets. Neurologically intact. She'll continue to monitor.  Encouraged debrox for cerumen.  Orders Placed This Encounter  Procedures  . Comp Met (CMET)  . TSH  . Lipid Profile    [name], MD [place]

## 2017-02-23 NOTE — Assessment & Plan Note (Signed)
Pretty stable at this time. She'll continue to follow with the therapist. Continue current medications.

## 2017-02-23 NOTE — Progress Notes (Signed)
Pre visit review using our clinic review tool, if applicable. No additional management support is needed unless otherwise documented below in the visit note. 

## 2017-02-23 NOTE — Assessment & Plan Note (Addendum)
Patient with mechanical fall several weeks ago. Seems to be recovering well. Mild soreness of her knee. No bony defects of her forehead or her eye sockets. Neurologically intact. She'll continue to monitor.

## 2017-02-24 ENCOUNTER — Ambulatory Visit: Payer: Medicare Other | Admitting: Psychology

## 2017-02-24 NOTE — Telephone Encounter (Signed)
Last OV 02/23/17 last filled 01/13/17 60 0rf

## 2017-02-24 NOTE — Telephone Encounter (Signed)
faxed

## 2017-02-28 ENCOUNTER — Other Ambulatory Visit: Payer: Self-pay | Admitting: Family Medicine

## 2017-02-28 DIAGNOSIS — E785 Hyperlipidemia, unspecified: Secondary | ICD-10-CM

## 2017-02-28 MED ORDER — ATORVASTATIN CALCIUM 80 MG PO TABS: 80.0000 mg | ORAL_TABLET | Freq: Every day | ORAL | 3 refills | Status: DC

## 2017-03-01 ENCOUNTER — Ambulatory Visit: Payer: Self-pay | Admitting: Pulmonary Disease

## 2017-03-09 ENCOUNTER — Ambulatory Visit
Admission: RE | Admit: 2017-03-09 | Discharge: 2017-03-09 | Disposition: A | Discharge status: Home / Self Care | Payer: Medicare Other | Source: Ambulatory Visit | Attending: Family Medicine | Admitting: Family Medicine

## 2017-03-09 DIAGNOSIS — Z1231 Encounter for screening mammogram for malignant neoplasm of breast: Secondary | ICD-10-CM | POA: Diagnosis not present

## 2017-03-21 DIAGNOSIS — M9902 Segmental and somatic dysfunction of thoracic region: Secondary | ICD-10-CM | POA: Diagnosis not present

## 2017-03-21 DIAGNOSIS — M9901 Segmental and somatic dysfunction of cervical region: Secondary | ICD-10-CM | POA: Diagnosis not present

## 2017-03-21 DIAGNOSIS — M5414 Radiculopathy, thoracic region: Secondary | ICD-10-CM | POA: Diagnosis not present

## 2017-03-21 DIAGNOSIS — M5033 Other cervical disc degeneration, cervicothoracic region: Secondary | ICD-10-CM | POA: Diagnosis not present

## 2017-03-25 ENCOUNTER — Emergency Department: Payer: Medicare Other

## 2017-03-25 ENCOUNTER — Emergency Department
Admission: EM | Admit: 2017-03-25 | Discharge: 2017-03-26 | Disposition: A | Discharge status: Home / Self Care | Payer: Medicare Other | Attending: Emergency Medicine | Admitting: Emergency Medicine

## 2017-03-25 ENCOUNTER — Encounter: Payer: Self-pay | Admitting: Emergency Medicine

## 2017-03-25 DIAGNOSIS — R6889 Other general symptoms and signs: Secondary | ICD-10-CM | POA: Diagnosis not present

## 2017-03-25 DIAGNOSIS — E039 Hypothyroidism, unspecified: Secondary | ICD-10-CM | POA: Insufficient documentation

## 2017-03-25 DIAGNOSIS — M5432 Sciatica, left side: Secondary | ICD-10-CM

## 2017-03-25 DIAGNOSIS — I2510 Atherosclerotic heart disease of native coronary artery without angina pectoris: Secondary | ICD-10-CM | POA: Insufficient documentation

## 2017-03-25 DIAGNOSIS — I10 Essential (primary) hypertension: Secondary | ICD-10-CM | POA: Insufficient documentation

## 2017-03-25 DIAGNOSIS — I252 Old myocardial infarction: Secondary | ICD-10-CM | POA: Insufficient documentation

## 2017-03-25 DIAGNOSIS — Z79899 Other long term (current) drug therapy: Secondary | ICD-10-CM | POA: Diagnosis not present

## 2017-03-25 DIAGNOSIS — J449 Chronic obstructive pulmonary disease, unspecified: Secondary | ICD-10-CM | POA: Insufficient documentation

## 2017-03-25 DIAGNOSIS — F17210 Nicotine dependence, cigarettes, uncomplicated: Secondary | ICD-10-CM | POA: Diagnosis not present

## 2017-03-25 DIAGNOSIS — M545 Low back pain: Secondary | ICD-10-CM | POA: Diagnosis present

## 2017-03-25 DIAGNOSIS — M5442 Lumbago with sciatica, left side: Secondary | ICD-10-CM | POA: Diagnosis not present

## 2017-03-25 MED ORDER — HYDROMORPHONE HCL 1 MG/ML IJ SOLN
1.0000 mg | Freq: Once | INTRAMUSCULAR | Status: AC
  Administered 2017-03-25: 1 mg via INTRAVENOUS
  Filled 2017-03-25: qty 1

## 2017-03-25 MED ORDER — KETOROLAC TROMETHAMINE 30 MG/ML IJ SOLN
30.0000 mg | Freq: Once | INTRAMUSCULAR | Status: AC
  Administered 2017-03-25: 30 mg via INTRAVENOUS
  Filled 2017-03-25: qty 1

## 2017-03-25 MED ORDER — OXYCODONE-ACETAMINOPHEN 7.5-325 MG PO TABS: 1.0000 | ORAL_TABLET | ORAL | 0 refills | Status: DC | PRN

## 2017-03-25 MED ORDER — OXYCODONE-ACETAMINOPHEN 5-325 MG PO TABS
2.0000 | ORAL_TABLET | Freq: Once | ORAL | Status: AC
  Administered 2017-03-25: 2 via ORAL
  Filled 2017-03-25: qty 2

## 2017-03-25 NOTE — ED Provider Notes (Signed)
[facility] Emergency Department Provider Note       Time seen: ----------------------------------------- 9:13 PM on 03/25/2017 -----------------------------------------     I have reviewed the triage vital signs and the nursing notes.   HISTORY   Chief Complaint No chief complaint on file.    HPI Jodi Beasley is a 71 y.o. female who presents to the ED for radicular low back pain. Patient brought by EMS from home for acute on chronic low back pain. Patient reports his been worse for 2 days with radiation down the left leg posteriorly. Patient denies fevers, chills, vomiting or diarrhea. Patient denies loss of bowel or bladder function, denies numbness or weakness. Patient states she was seen earlier at an urgent care and given IM Toradol and Decadron without significant improvement.   Past Medical History:  Diagnosis Date  . Arrhythmia   . Chicken pox   . COPD (chronic obstructive pulmonary disease) ([place])   . Coronary artery disease    NSTEMI in 09/2008. Cath : 80% RCA and 95% first diagonal. PCI and 2 DES placement  RCA and 1 DES to diagonal. Complicated by acute diagonal stent thrombosis . Treated by thrombectomy. Most recent nuclear stress test in 2010 showed no ischemia with normal EF.   Marland Kitchen Depression   . GERD (gastroesophageal reflux disease)   . Hay fever   . History of blood transfusion   . History of hypercholesterolemia   . History of syncope 2000   negative EP study  . Hyperlipidemia    intolerance to statins except Crestor.   . Hypertension   . Hypothyroid   . MI (myocardial infarction) ([place])   . Pneumonia, organism unspecified(486)     Patient Active Problem List   Diagnosis Date Noted  . Fall 02/23/2017  . Rotator cuff impingement syndrome of right shoulder 09/26/2016  . Epidermal cyst 03/02/2016  . Hand pain 03/02/2016  . Hyponatremia 03/02/2016  . Hypercholesterolemia 10/21/2015  . Osteopenia 07/20/2015  . De Quervain's  tenosynovitis, right 01/16/2015  . Adjustment disorder with mixed anxiety and depressed mood 11/19/2013  . Back pain 03/07/2013  . Hypothyroidism 01/24/2013  . Other and unspecified hyperlipidemia 01/24/2013  . Allergic rhinitis 01/21/2013  . Anxiety 07/19/2012  . Hypertension   . Screening for skin cancer 04/19/2012  . Coronary artery disease 04/19/2012  . Atrophic vaginitis 04/19/2012  . COPD (chronic obstructive pulmonary disease) with chronic bronchitis ([place]) 04/09/2012  . Tobacco abuse 04/09/2012    Past Surgical History:  Procedure Laterality Date  . CORONARY ANGIOPLASTY WITH STENT PLACEMENT  2009   [facility], drug-eluting mid RCA,prox diag;  . TONSILLECTOMY    . TUBAL LIGATION      Allergies Benzocaine; Darvon [propoxyphene hcl]; Monosodium glutamate; Neosporin [neomycin-bacitracin zn-polymyx]; Nicoderm [nicotine]; and Prednisone  Social History Social History  Substance Use Topics  . Smoking status: Current Every Day Smoker    Packs/day: 1.00    Years: 53.00    Types: E-cigarettes, Cigarettes    Start date: 11/19/1960  . Smokeless tobacco: Never Used     Comment: smoking 2 cigs daily, uses vape also 09/14/16  . Alcohol use 0.6 oz/week    1 Standard drinks or equivalent per week    Review of Systems Constitutional: Negative for fever. Cardiovascular: Negative for chest pain. Respiratory: Negative for shortness of breath. Gastrointestinal: Negative for abdominal pain, vomiting and diarrhea. Genitourinary: Negative for dysuria. Musculoskeletal: Positive for back pain Skin: Negative for rash. Neurological: Negative for headaches, focal weakness or numbness.  10-point ROS otherwise negative.  ____________________________________________   PHYSICAL EXAM:  VITAL SIGNS: ED Triage Vitals  Enc Vitals Group     BP      Pulse      Resp      Temp      Temp src      SpO2      Weight      Height      Head Circumference      Peak Flow      Pain Score       Pain Loc      Pain Edu?      Excl. in [place]?     Constitutional: Alert and oriented. Well appearing and in no distress. Eyes: Conjunctivae are normal. PERRL. Normal extraocular movements. Cardiovascular: Normal rate, regular rhythm. No murmurs, rubs, or gallops. Respiratory: Normal respiratory effort without tachypnea nor retractions. Breath sounds are clear and equal bilaterally. No wheezes/rales/rhonchi. Gastrointestinal: Soft and nontender. Normal bowel sounds Musculoskeletal: Nontender with normal range of motion in extremities. No lower extremity tenderness nor edema. Positive straight leg raise examination on the left. Patient describes S1 radicular pain in the left leg Neurologic:  Normal speech and language. No gross focal neurologic deficits are appreciated. Normal motor and sensory examination of the legs Skin:  Skin is warm, dry and intact. No rash noted. Psychiatric: Mood and affect are normal. Speech and behavior are normal.  ___________________________________________  ED COURSE:  Pertinent labs & imaging results that were available during my care of the patient were reviewed by me and considered in my medical decision making (see chart for details). Patient presents for sciatica, we will assess with imaging as indicated.   Procedures ____________________________________________   RADIOLOGY Images were viewed by me  LS-spine series  IMPRESSION: 1. No evidence of acute fracture or subluxation along the lumbar spine. 2. Grade 1 anterolisthesis of L4 on L5, with underlying facet disease. 3. Scattered aortic atherosclerosis. ____________________________________________  FINAL ASSESSMENT AND PLAN  Sciatica  Plan: Patient's labs and imaging were dictated above. Patient had presented for radicular low back pain that was acute on chronic. She did see improvement in her pain after IV narcotics. She is encouraged to continue taking her steroids as prescribed. Our refer  her to spine surgery for outpatient follow-up. She would likely benefit from steroid injections.   Earleen Newport, MD   Note: This note was generated in part or whole with voice recognition software. Voice recognition is usually quite accurate but there are transcription errors that can and very often do occur. I apologize for any typographical errors that were not detected and corrected.     Earleen Newport, MD 03/25/17 2234

## 2017-03-25 NOTE — ED Triage Notes (Signed)
EMS pt to rm 24 from home with report of chronic low back pain  That has been worse for  2 days. No new injury. Pt seen earlier in the day at [facility] and given IM ketorlac and decadron.

## 2017-03-27 ENCOUNTER — Emergency Department
Admission: EM | Admit: 2017-03-27 | Discharge: 2017-03-27 | Disposition: A | Discharge status: Home / Self Care | Payer: Medicare Other | Attending: Emergency Medicine | Admitting: Emergency Medicine

## 2017-03-27 ENCOUNTER — Other Ambulatory Visit: Payer: Self-pay | Admitting: Family Medicine

## 2017-03-27 DIAGNOSIS — M5416 Radiculopathy, lumbar region: Secondary | ICD-10-CM | POA: Insufficient documentation

## 2017-03-27 DIAGNOSIS — F17210 Nicotine dependence, cigarettes, uncomplicated: Secondary | ICD-10-CM | POA: Diagnosis not present

## 2017-03-27 DIAGNOSIS — G8929 Other chronic pain: Secondary | ICD-10-CM | POA: Insufficient documentation

## 2017-03-27 DIAGNOSIS — M545 Low back pain, unspecified: Secondary | ICD-10-CM

## 2017-03-27 DIAGNOSIS — I2510 Atherosclerotic heart disease of native coronary artery without angina pectoris: Secondary | ICD-10-CM | POA: Diagnosis not present

## 2017-03-27 DIAGNOSIS — Z7982 Long term (current) use of aspirin: Secondary | ICD-10-CM | POA: Diagnosis not present

## 2017-03-27 DIAGNOSIS — Z79899 Other long term (current) drug therapy: Secondary | ICD-10-CM | POA: Diagnosis not present

## 2017-03-27 DIAGNOSIS — E039 Hypothyroidism, unspecified: Secondary | ICD-10-CM | POA: Insufficient documentation

## 2017-03-27 DIAGNOSIS — I10 Essential (primary) hypertension: Secondary | ICD-10-CM | POA: Diagnosis not present

## 2017-03-27 DIAGNOSIS — J449 Chronic obstructive pulmonary disease, unspecified: Secondary | ICD-10-CM | POA: Insufficient documentation

## 2017-03-27 DIAGNOSIS — M549 Dorsalgia, unspecified: Secondary | ICD-10-CM | POA: Diagnosis not present

## 2017-03-27 MED ORDER — ONDANSETRON 4 MG PO TBDP: 4.0000 mg | ORAL_TABLET | Freq: Four times a day (QID) | ORAL | 0 refills | Status: DC | PRN

## 2017-03-27 MED ORDER — METOCLOPRAMIDE HCL 10 MG PO TABS
10.0000 mg | ORAL_TABLET | Freq: Once | ORAL | Status: AC
  Administered 2017-03-27: 10 mg via ORAL
  Filled 2017-03-27: qty 1

## 2017-03-27 MED ORDER — KETOROLAC TROMETHAMINE 60 MG/2ML IM SOLN
30.0000 mg | Freq: Once | INTRAMUSCULAR | Status: AC
  Administered 2017-03-27: 30 mg via INTRAMUSCULAR
  Filled 2017-03-27: qty 2

## 2017-03-27 MED ORDER — ORPHENADRINE CITRATE 30 MG/ML IJ SOLN
60.0000 mg | INTRAMUSCULAR | Status: AC
  Administered 2017-03-27: 60 mg via INTRAMUSCULAR
  Filled 2017-03-27: qty 2

## 2017-03-27 NOTE — Discharge Instructions (Signed)
You are experiencing pain due to your underlying degenerative disk disease. Take your previously prescribed medicines as directed. Follow-up with Dr. Caryl Bis and/or Dr. Lacinda Axon for further treatment.

## 2017-03-27 NOTE — ED Triage Notes (Signed)
Arrives via [facility].  Seen last week through ED for same complaint.  Given RX for pain, patient has not filled RX.

## 2017-03-27 NOTE — ED Notes (Signed)
Pt verbalized understanding of discharge instructions. NAD at this time. 

## 2017-03-27 NOTE — ED Triage Notes (Signed)
Pt wheeled to triage, states has been to UC and this ED for back pain, told it's sciatica. States L side low back pain that goes down hip and L knee. States told to follow up with neuro doc but hasn't been. States "I couldn't even pick up my oxy because of the pain."

## 2017-03-28 ENCOUNTER — Telehealth: Payer: Self-pay | Admitting: *Deleted

## 2017-03-28 NOTE — Telephone Encounter (Signed)
Last OV 02/23/17 last filled 02/24/17 60 0rf

## 2017-03-28 NOTE — Telephone Encounter (Signed)
Patient is scheduled for follow up

## 2017-03-28 NOTE — Telephone Encounter (Signed)
Please give a time and day to place pt for a ER follow up.  Pt contact [phone]

## 2017-03-30 DIAGNOSIS — M545 Low back pain: Secondary | ICD-10-CM | POA: Diagnosis not present

## 2017-03-31 ENCOUNTER — Ambulatory Visit (INDEPENDENT_AMBULATORY_CARE_PROVIDER_SITE_OTHER): Payer: Medicare Other | Admitting: Family Medicine

## 2017-03-31 ENCOUNTER — Encounter: Payer: Self-pay | Admitting: Family Medicine

## 2017-03-31 DIAGNOSIS — G8929 Other chronic pain: Secondary | ICD-10-CM

## 2017-03-31 DIAGNOSIS — I2510 Atherosclerotic heart disease of native coronary artery without angina pectoris: Secondary | ICD-10-CM

## 2017-03-31 DIAGNOSIS — M5441 Lumbago with sciatica, right side: Secondary | ICD-10-CM

## 2017-03-31 DIAGNOSIS — R21 Rash and other nonspecific skin eruption: Secondary | ICD-10-CM | POA: Insufficient documentation

## 2017-03-31 DIAGNOSIS — I10 Essential (primary) hypertension: Secondary | ICD-10-CM | POA: Diagnosis not present

## 2017-03-31 DIAGNOSIS — M5442 Lumbago with sciatica, left side: Secondary | ICD-10-CM

## 2017-03-31 DIAGNOSIS — F4323 Adjustment disorder with mixed anxiety and depressed mood: Secondary | ICD-10-CM

## 2017-03-31 MED ORDER — OXYCODONE-ACETAMINOPHEN 7.5-325 MG PO TABS: 1.0000 | ORAL_TABLET | ORAL | 0 refills | Status: DC | PRN

## 2017-03-31 NOTE — Progress Notes (Addendum)
Jodi Rumps, MD Phone: [phone]  Jodi Beasley is a 71 y.o. female who presents today for f/u.  Back pain: Patient was seen in the emergency room over the weekend on 2 occasions after pulling something in her left low back. She went to urgent care and received 2 injections that did not help. She then went to the emergency room and received 2 injections of Dilaudid and a muscle relaxer which provided some benefit. They gave her some Percocet to take which has been beneficial though she is out of this medication. She notes she went back to the emergency room several days later to get more shots and to get something to settle her stomach. She saw a neurosurgeon yesterday who advised physical therapy and a cortisone shot in her spine. He provided her with a short term prescription for narcotics. She reports she has been taking Percocet 7.5-325 mg tablets every 4 or so hours for severe pain.  She notes a rash on her left inner thigh that does not itch. It does burn some. Has been present since Sunday. She had been using heating pad in the area. She has been using Vaseline in the area and has had improvement in her symptoms.  She reports depression. She is angry that she's not able to get the medicine she needs to be able to treat her pain based on the new laws that are in place in [place]. She reports she just wants some relief. She did circle 2 on the PHQ 9 question regarding thoughts that she be better off dead or of hurting herself in some way. I clarified this with the patient and she reported the following: she reports NO suicidal ideation and has NO intent or plan to harm herself. She does have thoughts that she would be better off without having the pain though has NO thoughts that she would be better off dead.  PMH: smoker   ROS see history of present illness  Objective  Physical Exam Vitals:   03/31/17 0910  BP: (!) 144/98  Pulse: 71  Temp: 98.4 F (36.9 C)    BP  Readings from Last 3 Encounters:  03/31/17 (!) 144/98  03/27/17 (!) 162/90  03/26/17 (!) 156/80   Wt Readings from Last 3 Encounters:  03/31/17 162 lb 3.2 oz (73.6 kg)  03/27/17 162 lb (73.5 kg)  03/25/17 162 lb (73.5 kg)    Physical Exam  Constitutional: No distress.  Cardiovascular: Normal rate, regular rhythm and normal heart sounds.   Pulmonary/Chest: Effort normal and breath sounds normal.  Musculoskeletal: She exhibits no edema.  No midline spine tenderness, no midline spine step-off, there is mild tenderness over the left SI joint  Neurological: She is alert. Gait normal.  5 out of 5 strength bilateral quads, hamstrings, plantar flexion, and dorsiflexion, sensation to light touch intact in bilateral lower extremities  Skin: She is not diaphoretic.  No evidence of rash on her thighs or inguinal region     Assessment/Plan: Please see individual problem list.  Hypertension Suspect elevated related to her pain. She'll continue to monitor.  Back pain Acute on chronic exacerbation of her back pain. She is neurologically intact in her lower extremities. She has been unable to get relief with anything but narcotics. Suspect she'll benefit from seeing physiatry to consider injections in her back. Given her level of discomfort I discussed a short course of Percocet. She was advised that she would not receive any more Percocet than was prescribed today. The  [place] drug database was reviewed and the patient has not had any inappropriate refills of controlled substances. She will work on getting in to see physiatry sometime next week. She is given return precautions.  Adjustment disorder with mixed anxiety and depressed mood Her mood has worsened surrounding her recent issues with low back pain. I had a long discussion with her regarding the last question on the PHQ 9. She reports she is not suicidal and is not having any thoughts of harming herself. She has no intent or plan to  harm herself. She is just angry that she is unable to get the medications that she needs to help treat her back pain. We contracted for safety and she advised me that she would not harm herself. I discussed that if she did develop thoughts of harming herself or developed an intent or plan to harm herself she should be evaluated in the emergency room. She'll continue her current medications. I advised her to see her psychiatrist.  Rash No evidence of rash on exam. She'll continue to monitor.   No orders of the defined types were placed in this encounter.   Meds ordered this encounter  Medications  . oxyCODONE-acetaminophen (PERCOCET) 7.5-325 MG tablet    Sig: Take 1 tablet by mouth every 4 (four) hours as needed for severe pain.    Dispense:  30 tablet    Refill:  0    Jodi Rumps, MD [place]

## 2017-03-31 NOTE — Progress Notes (Signed)
Pre visit review using our clinic review tool, if applicable. No additional management support is needed unless otherwise documented below in the visit note. 

## 2017-03-31 NOTE — Assessment & Plan Note (Addendum)
Acute on chronic exacerbation of her back pain. She is neurologically intact in her lower extremities. She has been unable to get relief with anything but narcotics. Suspect she'll benefit from seeing physiatry to consider injections in her back. Given her level of discomfort I discussed a short course of Percocet. She was advised that she would not receive any more Percocet than was prescribed today. The [facility] drug database was reviewed and the patient has not had any inappropriate refills of controlled substances. She will work on getting in to see physiatry sometime next week. She is given return precautions.

## 2017-03-31 NOTE — Patient Instructions (Signed)
Nice to see you. We'll provide you with a very short course of medication for your back pain. You should not mix this with your muscle relaxer. You need to get set up for a cortisone shot as advised by the spinal physician. Monitor the area where you felt the rash was. If this recurs let us know. You should see your therapist. I suspect much of how you're feeling is related to being in pain and not being able to get any relief. We did contract for safety today. If you develop thoughts of harming your self, or seeing back pain, numbness, weakness, loss of bowel or bladder function, numbness between her legs, or any new or changing symptoms please seek medical attention immediately.

## 2017-03-31 NOTE — Assessment & Plan Note (Signed)
No evidence of rash on exam. She'll continue to monitor.

## 2017-03-31 NOTE — Assessment & Plan Note (Addendum)
Her mood has worsened surrounding her recent issues with low back pain. I had a long discussion with her regarding the last question on the PHQ 9. She reports she is not suicidal and is not having any thoughts of harming herself. She has no intent or plan to harm herself. She is just angry that she is unable to get the medications that she needs to help treat her back pain. We contracted for safety and she advised me that she would not harm herself. I discussed that if she did develop thoughts of harming herself or developed an intent or plan to harm herself she should be evaluated in the emergency room. She'll continue her current medications. I advised her to see her psychiatrist.

## 2017-03-31 NOTE — Assessment & Plan Note (Signed)
Suspect elevated related to her pain. She'll continue to monitor.

## 2017-04-01 NOTE — ED Provider Notes (Signed)
[facility] Emergency Department Provider Note ____________________________________________  Time seen: 1156  I have reviewed the triage vital signs and the nursing notes.  HISTORY  Chief Complaint  Back Pain  HPI Jodi Beasley is a 71 y.o. female presents to the ED via EMS from home, for evaluation of left sciatica pain. She notes nausea & vomiting as well as ongoing pain. She dosed a leftover Vicodin and her prednisone prior to arrival.  She was Seen here in the ED last week with the same complaint. She is given a prescription for narcotic pain medicine but reports she has not filled that prescription. She was also referred to neurosurgery for further evaluation; she has not called for a follow-up. She reports she was seen at the local urgent care just prior to this visit. She was placed on prednisone and given a injection of ketorolac in the urgent care. She presents for continued pain and disability. She denies any bladder or bowel incontinence, foot drop, or leg weakness.  Past Medical History:  Diagnosis Date  . Arrhythmia   . Chicken pox   . COPD (chronic obstructive pulmonary disease) ([place])   . Coronary artery disease    NSTEMI in 09/2008. Cath : 80% RCA and 95% first diagonal. PCI and 2 DES placement  RCA and 1 DES to diagonal. Complicated by acute diagonal stent thrombosis . Treated by thrombectomy. Most recent nuclear stress test in 2010 showed no ischemia with normal EF.   Marland Kitchen Depression   . GERD (gastroesophageal reflux disease)   . Hay fever   . History of blood transfusion   . History of hypercholesterolemia   . History of syncope 2000   negative EP study  . Hyperlipidemia    intolerance to statins except Crestor.   . Hypertension   . Hypothyroid   . MI (myocardial infarction) ([place])   . Pneumonia, organism unspecified(486)     Patient Active Problem List   Diagnosis Date Noted  . Rash 03/31/2017  . Fall 02/23/2017  . Rotator cuff  impingement syndrome of right shoulder 09/26/2016  . Epidermal cyst 03/02/2016  . Hand pain 03/02/2016  . Hyponatremia 03/02/2016  . Hypercholesterolemia 10/21/2015  . Osteopenia 07/20/2015  . De Quervain's tenosynovitis, right 01/16/2015  . Adjustment disorder with mixed anxiety and depressed mood 11/19/2013  . Back pain 03/07/2013  . Hypothyroidism 01/24/2013  . Other and unspecified hyperlipidemia 01/24/2013  . Allergic rhinitis 01/21/2013  . Anxiety 07/19/2012  . Hypertension   . Screening for skin cancer 04/19/2012  . Coronary artery disease 04/19/2012  . Atrophic vaginitis 04/19/2012  . COPD (chronic obstructive pulmonary disease) with chronic bronchitis ([place]) 04/09/2012  . Tobacco abuse 04/09/2012    Past Surgical History:  Procedure Laterality Date  . CORONARY ANGIOPLASTY WITH STENT PLACEMENT  2009   [facility], drug-eluting mid RCA,prox diag;  . TONSILLECTOMY    . TUBAL LIGATION      Prior to Admission medications   Medication Sig Start Date End Date Taking? Authorizing Provider  albuterol (PROVENTIL) (2.5 MG/3ML) 0.083% nebulizer solution Take 3 mLs (2.5 mg total) by nebulization every 6 (six) hours as needed for wheezing or shortness of breath. 09/14/16   Juanito Doom, MD  aspirin 81 MG tablet Take 81 mg by mouth daily.    Historical Provider, MD  atorvastatin (LIPITOR) 80 MG tablet Take 1 tablet (80 mg total) by mouth daily. 02/28/17   Leone Haven, MD  budesonide-formoterol [facility]) 160-4.5 MCG/ACT inhaler Inhale 2 puffs into  the lungs 2 (two) times daily. 09/14/16   Juanito Doom, MD  buPROPion (WELLBUTRIN XL) 150 MG 24 hr tablet Take 1 tablet (150 mg total) by mouth daily. 09/30/16   Leone Haven, MD  carisoprodol (SOMA) 350 MG tablet TAKE 1 TABLET BY MOUTH TWICE DAILY AS NEEDED FOR MUSCLE SPASMS 03/28/17   Leone Haven, MD  Cholecalciferol (VITAMIN D-3) 1000 UNITS CAPS Take 1 capsule by mouth daily.    Historical Provider, MD   citalopram (CELEXA) 40 MG tablet Take 1 tablet (40 mg total) by mouth daily. 09/30/16   Leone Haven, MD  Coenzyme Q10 (COQ10) 400 MG CAPS Take 400 mg by mouth daily.    Historical Provider, MD  diphenhydrAMINE (BENADRYL) 25 MG tablet Take 25 mg by mouth every 6 (six) hours as needed.    Historical Provider, MD  fluticasone (FLONASE) 50 MCG/ACT nasal spray USE TWO SPRAYS EACH NOSTRIL EVERY DAY  Patient taking differently: prn 02/23/15   Jackolyn Confer, MD  levothyroxine (SYNTHROID, LEVOTHROID) 112 MCG tablet TAKE 1 TABLET BY MOUTH ONCE DAILY BEFORE BREAKFAST 04/26/16   Leone Haven, MD  metoprolol (LOPRESSOR) 100 MG tablet Take 1 tablet (100 mg total) by mouth 2 (two) times daily. 09/30/16   Leone Haven, MD  Multiple Vitamin (MULTIVITAMIN) capsule Take 1 capsule by mouth daily.    Historical Provider, MD  nitroGLYCERIN (NITROSTAT) 0.4 MG SL tablet Place 1 tablet (0.4 mg total) under the tongue every 5 (five) minutes as needed. 01/10/17   Mihai Croitoru, MD  omeprazole (PRILOSEC) 20 MG capsule TAKE ONE CAPSULE BY MOUTH ONCE DAILY 05/25/16   Leone Haven, MD  ondansetron (ZOFRAN ODT) 4 MG disintegrating tablet Take 1 tablet (4 mg total) by mouth every 6 (six) hours as needed for nausea or vomiting. 03/27/17   Dannielle Karvonen Namiah Dunnavant, PA-C  oxyCODONE-acetaminophen (PERCOCET) 7.5-325 MG tablet Take 1 tablet by mouth every 4 (four) hours as needed for severe pain. 03/31/17 03/31/18  Leone Haven, MD  tiotropium (SPIRIVA HANDIHALER) 18 MCG inhalation capsule Place 18 mcg into inhaler and inhale daily.    Historical Provider, MD  UNABLE TO FIND Med Name: Sinus and Allergy relief PE Per box directions as needed    Historical Provider, MD    Allergies Benzocaine; Darvon [propoxyphene hcl]; Monosodium glutamate; Neosporin [neomycin-bacitracin zn-polymyx]; Nicoderm [nicotine]; and Prednisone  Family History  Problem Relation Age of Onset  . Colon cancer Father   . Lung cancer Father      was a former smoker  . Cancer Father     lung  . Hypertension Mother   . Hypertension    . Diabetes      Social History Social History  Substance Use Topics  . Smoking status: Current Every Day Smoker    Packs/day: 1.00    Years: 53.00    Types: E-cigarettes, Cigarettes    Start date: 11/19/1960  . Smokeless tobacco: Never Used     Comment: smoking 2 cigs daily, uses vape also 09/14/16  . Alcohol use 0.6 oz/week    1 Standard drinks or equivalent per week    Review of Systems  Constitutional: Negative for fever. Eyes: Negative for visual changes. ENT: Negative for sore throat. Cardiovascular: Negative for chest pain. Respiratory: Negative for shortness of breath. Gastrointestinal: Negative for abdominal pain, vomiting and diarrhea. Genitourinary: Negative for dysuria. Musculoskeletal: Positive for back pain. Skin: Negative for rash. Neurological: Negative for headaches, focal weakness or numbness. ____________________________________________  PHYSICAL EXAM:  VITAL SIGNS: ED Triage Vitals  Enc Vitals Group     BP 03/27/17 1120 (!) 171/95     Pulse Rate 03/27/17 1120 72     Resp 03/27/17 1120 20     Temp 03/27/17 1120 97.6 F (36.4 C)     Temp Source 03/27/17 1120 Oral     SpO2 03/27/17 1120 92 %     Weight 03/27/17 1122 162 lb (73.5 kg)     Height 03/27/17 1122 5\' 6"  (1.676 m)     Head Circumference --      Peak Flow --      Pain Score 03/27/17 1120 9     Pain Loc --      Pain Edu? --      Excl. in [place]? --     Constitutional: Alert and oriented. Well appearing and in no distress. Head: Normocephalic and atraumatic. Cardiovascular: Normal rate, regular rhythm. Normal distal pulses. Respiratory: Normal respiratory effort. No wheezes/rales/rhonchi. Gastrointestinal: Soft and nontender. No distention. Musculoskeletal: normal spinal alignment without Midline tenderness, deformity, spasm, or step-off. Negative supine straight leg raise on exam. Nontender  with normal range of motion in all extremities.  Neurologic:  Normal gross sensation. Normal LE DTRs bilaterally. Normal speech and language. No gross focal neurologic deficits are appreciated. Skin:  Skin is warm, dry and intact. No rash noted. Psychiatric: Mood and affect are normal. Patient exhibits appropriate insight and judgment. ____________________________________________  PROCEDURES  Toradol 30 mg IM Reglan 10 mg PO Norflex 60 mg IM _____________________________________________  RADIOLOGY  LS Spine (03/25/17)  IMPRESSION: 1. No evidence of acute fracture or subluxation along the lumbar spine. 2. Grade 1 anterolisthesis of L4 on L5, with underlying facet disease. 3. Scattered aortic atherosclerosis. ____________________________________________  INITIAL IMPRESSION / ASSESSMENT AND PLAN / ED COURSE  Patient with an acute flare for chronic low back pain and chronic lumbar radicular symptoms. Her x-ray shows L4-L5 anterolisthesis and facet arthropathy. This is likely the source of some of her radicular symptoms. She is discharged at this time with instructions to follow-up with neurosurgery for evaluation and management. She is advised also fill her previously written prescriptions and dose as directed. She reports improvement of her symptoms following ED medication administration. She is in agreement with the plan and will follow up with both her primary care provider and neuro surgery for further evaluation and management. ____________________________________________  FINAL CLINICAL IMPRESSION(S) / ED DIAGNOSES  Final diagnoses:  Chronic left-sided low back pain without sciatica  Lumbar radiculopathy      Melvenia Needles, PA-C 04/01/17 [place], MD 04/05/17 2047

## 2017-04-03 ENCOUNTER — Ambulatory Visit: Payer: Medicare Other | Admitting: Psychology

## 2017-04-04 ENCOUNTER — Telehealth: Payer: Self-pay | Admitting: Family Medicine

## 2017-04-04 MED ORDER — OXYCODONE-ACETAMINOPHEN 7.5-325 MG PO TABS: 1.0000 | ORAL_TABLET | ORAL | 0 refills | Status: DC | PRN

## 2017-04-04 NOTE — Telephone Encounter (Signed)
Noted  

## 2017-04-04 NOTE — Telephone Encounter (Signed)
Pt called about needing some more pain medication pt states she needs some relief. Pt is taking 1 pill every three hours and she's taking volume as well. Pt wants to know what else to do? Please advise?  Call pt @ [phone]. Thank You!

## 2017-04-04 NOTE — Telephone Encounter (Signed)
Pt was called and informed to come by office to pick up rx at the front desk.

## 2017-04-04 NOTE — Telephone Encounter (Signed)
Patient is requesting refill on Percocet 5/325 mg taking 1 pill every 3 hours prescribed 1 every 4 hours.   She is not scheduled for injection 5/31.   Patient wants to know what she can to know what she can do in the meantime.  Advised patient not to take more medication than prescribed.   Please advise.

## 2017-04-04 NOTE — Telephone Encounter (Signed)
Please see if the percocet provides any relief. If it does I will refill the percocet and she needs to stick to taking this every 4 hours. If it does not provide relief there is no need to refill it. Please see if she has scheduled her MRI lumbar spine through the neurosurgeons office. Thanks.

## 2017-04-04 NOTE — Telephone Encounter (Signed)
Patient states it did give her life and she will take it every 4 hours, she is still waiting on her mri to be scheduled

## 2017-04-04 NOTE — Telephone Encounter (Signed)
Please advise 

## 2017-04-05 ENCOUNTER — Other Ambulatory Visit: Payer: Self-pay | Admitting: Neurosurgery

## 2017-04-05 DIAGNOSIS — M545 Low back pain: Secondary | ICD-10-CM

## 2017-04-10 ENCOUNTER — Other Ambulatory Visit (INDEPENDENT_AMBULATORY_CARE_PROVIDER_SITE_OTHER): Payer: Medicare Other

## 2017-04-10 DIAGNOSIS — E785 Hyperlipidemia, unspecified: Secondary | ICD-10-CM

## 2017-04-10 LAB — LDL CHOLESTEROL, DIRECT: LDL DIRECT: 92 mg/dL

## 2017-04-10 LAB — HEPATIC FUNCTION PANEL
ALBUMIN: 4.3 g/dL (ref 3.5–5.2)
ALT: 17 U/L (ref 0–35)
AST: 18 U/L (ref 0–37)
Alkaline Phosphatase: 69 U/L (ref 39–117)
Bilirubin, Direct: 0.1 mg/dL (ref 0.0–0.3)
TOTAL PROTEIN: 7.3 g/dL (ref 6.0–8.3)
Total Bilirubin: 0.4 mg/dL (ref 0.2–1.2)

## 2017-04-10 NOTE — Telephone Encounter (Signed)
I cannot refill ([facility] is out).

## 2017-04-10 NOTE — Telephone Encounter (Signed)
Pt called this morning stating she needs another Rx refill for percocet. Please advise?  Call pt @ [phone]. Thank you!

## 2017-04-10 NOTE — Telephone Encounter (Addendum)
Patient advised of below , she stated she  didn't understand why script  couldn't be filled.  I explained to her why that it was a controlled substance and Dr Lacinda Axon didn't feel comfortable filling script .   Patient stated it wasn't right that she couldn't get script filled.   Patient stated that she would suffer all week due to pain until her provider got back.   I explained to patient  Provider still  may not fill when got he back . See below.

## 2017-04-10 NOTE — Addendum Note (Signed)
Addended by: Johna Sheriff on: 04/10/2017 08:42 AM   Modules accepted: Orders

## 2017-04-10 NOTE — Telephone Encounter (Signed)
MRI  L-Spine scheduled 04/21/17  Scheduled for L-spine injection 05/04/17 Dr Sharlet Salina

## 2017-04-10 NOTE — Telephone Encounter (Signed)
Left voice mail to call back 

## 2017-04-17 ENCOUNTER — Telehealth: Payer: Self-pay

## 2017-04-17 DIAGNOSIS — M5033 Other cervical disc degeneration, cervicothoracic region: Secondary | ICD-10-CM | POA: Diagnosis not present

## 2017-04-17 DIAGNOSIS — M5414 Radiculopathy, thoracic region: Secondary | ICD-10-CM | POA: Diagnosis not present

## 2017-04-17 DIAGNOSIS — M9902 Segmental and somatic dysfunction of thoracic region: Secondary | ICD-10-CM | POA: Diagnosis not present

## 2017-04-17 DIAGNOSIS — M9901 Segmental and somatic dysfunction of cervical region: Secondary | ICD-10-CM | POA: Diagnosis not present

## 2017-04-17 NOTE — Telephone Encounter (Signed)
-----   Message from Leone Haven, MD sent at 04/17/2017  8:28 AM EDT ----- Please let the patient know that her LDL is significantly improved from prior. She should continue the Lipitor. Thanks.

## 2017-04-17 NOTE — Telephone Encounter (Signed)
Left message to return call 

## 2017-04-17 NOTE — Telephone Encounter (Signed)
Pt called back returning your call. Pt asks if you can call her in the afternoon after 1.   Call pt @ [phone]. Thank you!

## 2017-04-18 ENCOUNTER — Ambulatory Visit: Payer: Medicare Other

## 2017-04-18 NOTE — Telephone Encounter (Signed)
Left message to notify

## 2017-04-19 DIAGNOSIS — M5033 Other cervical disc degeneration, cervicothoracic region: Secondary | ICD-10-CM | POA: Diagnosis not present

## 2017-04-19 DIAGNOSIS — M9901 Segmental and somatic dysfunction of cervical region: Secondary | ICD-10-CM | POA: Diagnosis not present

## 2017-04-19 DIAGNOSIS — M5414 Radiculopathy, thoracic region: Secondary | ICD-10-CM | POA: Diagnosis not present

## 2017-04-19 DIAGNOSIS — M9902 Segmental and somatic dysfunction of thoracic region: Secondary | ICD-10-CM | POA: Diagnosis not present

## 2017-04-20 ENCOUNTER — Ambulatory Visit (INDEPENDENT_AMBULATORY_CARE_PROVIDER_SITE_OTHER): Payer: Medicare Other | Admitting: Psychology

## 2017-04-20 DIAGNOSIS — F4323 Adjustment disorder with mixed anxiety and depressed mood: Secondary | ICD-10-CM

## 2017-04-20 DIAGNOSIS — M9902 Segmental and somatic dysfunction of thoracic region: Secondary | ICD-10-CM | POA: Diagnosis not present

## 2017-04-20 DIAGNOSIS — M5414 Radiculopathy, thoracic region: Secondary | ICD-10-CM | POA: Diagnosis not present

## 2017-04-20 DIAGNOSIS — M9901 Segmental and somatic dysfunction of cervical region: Secondary | ICD-10-CM | POA: Diagnosis not present

## 2017-04-20 DIAGNOSIS — M5033 Other cervical disc degeneration, cervicothoracic region: Secondary | ICD-10-CM | POA: Diagnosis not present

## 2017-04-21 ENCOUNTER — Ambulatory Visit
Admission: RE | Admit: 2017-04-21 | Discharge: 2017-04-21 | Disposition: A | Discharge status: Home / Self Care | Payer: Medicare Other | Source: Ambulatory Visit | Attending: Neurosurgery | Admitting: Neurosurgery

## 2017-04-21 DIAGNOSIS — M545 Low back pain: Secondary | ICD-10-CM | POA: Diagnosis not present

## 2017-04-21 DIAGNOSIS — M48061 Spinal stenosis, lumbar region without neurogenic claudication: Secondary | ICD-10-CM | POA: Insufficient documentation

## 2017-04-21 DIAGNOSIS — M4316 Spondylolisthesis, lumbar region: Secondary | ICD-10-CM | POA: Diagnosis not present

## 2017-04-21 DIAGNOSIS — M47816 Spondylosis without myelopathy or radiculopathy, lumbar region: Secondary | ICD-10-CM | POA: Insufficient documentation

## 2017-04-21 DIAGNOSIS — M47897 Other spondylosis, lumbosacral region: Secondary | ICD-10-CM | POA: Insufficient documentation

## 2017-04-24 ENCOUNTER — Other Ambulatory Visit: Payer: Self-pay | Admitting: Family Medicine

## 2017-04-24 DIAGNOSIS — M9901 Segmental and somatic dysfunction of cervical region: Secondary | ICD-10-CM | POA: Diagnosis not present

## 2017-04-24 DIAGNOSIS — M5033 Other cervical disc degeneration, cervicothoracic region: Secondary | ICD-10-CM | POA: Diagnosis not present

## 2017-04-24 DIAGNOSIS — M5414 Radiculopathy, thoracic region: Secondary | ICD-10-CM | POA: Diagnosis not present

## 2017-04-24 DIAGNOSIS — M9902 Segmental and somatic dysfunction of thoracic region: Secondary | ICD-10-CM | POA: Diagnosis not present

## 2017-04-24 NOTE — Telephone Encounter (Signed)
This was refilled last month, please advise, thanks

## 2017-04-25 ENCOUNTER — Ambulatory Visit: Payer: Self-pay | Admitting: Pulmonary Disease

## 2017-04-25 DIAGNOSIS — M5033 Other cervical disc degeneration, cervicothoracic region: Secondary | ICD-10-CM | POA: Diagnosis not present

## 2017-04-25 DIAGNOSIS — M9901 Segmental and somatic dysfunction of cervical region: Secondary | ICD-10-CM | POA: Diagnosis not present

## 2017-04-25 DIAGNOSIS — M9902 Segmental and somatic dysfunction of thoracic region: Secondary | ICD-10-CM | POA: Diagnosis not present

## 2017-04-25 DIAGNOSIS — M5414 Radiculopathy, thoracic region: Secondary | ICD-10-CM | POA: Diagnosis not present

## 2017-04-26 DIAGNOSIS — M5033 Other cervical disc degeneration, cervicothoracic region: Secondary | ICD-10-CM | POA: Diagnosis not present

## 2017-04-26 DIAGNOSIS — M9902 Segmental and somatic dysfunction of thoracic region: Secondary | ICD-10-CM | POA: Diagnosis not present

## 2017-04-26 DIAGNOSIS — L239 Allergic contact dermatitis, unspecified cause: Secondary | ICD-10-CM | POA: Diagnosis not present

## 2017-04-26 DIAGNOSIS — M9901 Segmental and somatic dysfunction of cervical region: Secondary | ICD-10-CM | POA: Diagnosis not present

## 2017-04-26 DIAGNOSIS — M5414 Radiculopathy, thoracic region: Secondary | ICD-10-CM | POA: Diagnosis not present

## 2017-04-27 ENCOUNTER — Ambulatory Visit (INDEPENDENT_AMBULATORY_CARE_PROVIDER_SITE_OTHER): Payer: Medicare Other | Admitting: Family Medicine

## 2017-04-27 ENCOUNTER — Encounter: Payer: Self-pay | Admitting: Family Medicine

## 2017-04-27 DIAGNOSIS — M5442 Lumbago with sciatica, left side: Secondary | ICD-10-CM

## 2017-04-27 DIAGNOSIS — I2510 Atherosclerotic heart disease of native coronary artery without angina pectoris: Secondary | ICD-10-CM | POA: Diagnosis not present

## 2017-04-27 DIAGNOSIS — G8929 Other chronic pain: Secondary | ICD-10-CM

## 2017-04-27 DIAGNOSIS — M5441 Lumbago with sciatica, right side: Secondary | ICD-10-CM | POA: Diagnosis not present

## 2017-04-27 NOTE — Patient Instructions (Signed)
Nice to see you. I am glad your back pain is better. Please continue to monitor this and see the chiropractor. If it is not continuing to do well I would proceed with the injection next week. If it worsens or you develop any new symptoms please seek medical attention.

## 2017-04-27 NOTE — Assessment & Plan Note (Signed)
Back pain is resolved. Significantly improved with her chiropractor. She'll continue to monitor. Advised if she's not having any pain next week she really shouldn't have the injection as there is no benefit to it. She'll call when she is ready to do physical therapy.

## 2017-04-27 NOTE — Progress Notes (Signed)
  [name], MD Phone: [phone]  Jodi Beasley is a 71 y.o. female who presents today for follow-up.  Low back pain: Patient notes no back pain now. She went back to her chiropractor and they've gotten her back in line. She had a MRI which did reveal some DDD and anterolisthesis and retrolisthesis. Had one area of moderate spinal stenosis. Is scheduled for an injection next week in her back though she is unsure if she is going to do this if she's not having any pain. She notes no numbness, weakness, incontinence, or saddle anesthesia. She's not quite ready to do physical therapy. She's not taking any narcotics currently.  ROS see history of present illness  Objective  Physical Exam Vitals:   04/27/17 1119  BP: 128/80  Pulse: 68  Temp: 97.7 F (36.5 C)    BP Readings from Last 3 Encounters:  04/27/17 128/80  03/31/17 (!) 144/98  03/27/17 (!) 162/90   Wt Readings from Last 3 Encounters:  04/27/17 159 lb 12.8 oz (72.5 kg)  03/31/17 162 lb 3.2 oz (73.6 kg)  03/27/17 162 lb (73.5 kg)    Physical Exam  Constitutional: No distress.  Cardiovascular: Normal rate, regular rhythm and normal heart sounds.   Pulmonary/Chest: Effort normal and breath sounds normal.  Musculoskeletal: She exhibits no edema.  No midline spine tenderness, no midline spine step-off, no muscular back tenderness  Neurological: She is alert. Gait normal.  5/5 strength bilateral quads, hamstrings, plantar flexion, and dorsiflexion, sensation to light touch intact in bilateral lower extremities, 2+ patellar reflexes  Skin: She is not diaphoretic.     Assessment/Plan: Please see individual problem list.  Back pain Back pain is resolved. Significantly improved with her chiropractor. She'll continue to monitor. Advised if she's not having any pain next week she really shouldn't have the injection as there is no benefit to it. She'll call when she is ready to do physical therapy.   [name],  MD [place]

## 2017-04-28 DIAGNOSIS — M5033 Other cervical disc degeneration, cervicothoracic region: Secondary | ICD-10-CM | POA: Diagnosis not present

## 2017-04-28 DIAGNOSIS — M5414 Radiculopathy, thoracic region: Secondary | ICD-10-CM | POA: Diagnosis not present

## 2017-04-28 DIAGNOSIS — M9901 Segmental and somatic dysfunction of cervical region: Secondary | ICD-10-CM | POA: Diagnosis not present

## 2017-04-28 DIAGNOSIS — M9902 Segmental and somatic dysfunction of thoracic region: Secondary | ICD-10-CM | POA: Diagnosis not present

## 2017-05-02 DIAGNOSIS — M9901 Segmental and somatic dysfunction of cervical region: Secondary | ICD-10-CM | POA: Diagnosis not present

## 2017-05-02 DIAGNOSIS — M9902 Segmental and somatic dysfunction of thoracic region: Secondary | ICD-10-CM | POA: Diagnosis not present

## 2017-05-02 DIAGNOSIS — M5033 Other cervical disc degeneration, cervicothoracic region: Secondary | ICD-10-CM | POA: Diagnosis not present

## 2017-05-02 DIAGNOSIS — M5414 Radiculopathy, thoracic region: Secondary | ICD-10-CM | POA: Diagnosis not present

## 2017-05-03 ENCOUNTER — Other Ambulatory Visit: Payer: Self-pay | Admitting: Family Medicine

## 2017-05-03 DIAGNOSIS — M9902 Segmental and somatic dysfunction of thoracic region: Secondary | ICD-10-CM | POA: Diagnosis not present

## 2017-05-03 DIAGNOSIS — M9901 Segmental and somatic dysfunction of cervical region: Secondary | ICD-10-CM | POA: Diagnosis not present

## 2017-05-03 DIAGNOSIS — M5414 Radiculopathy, thoracic region: Secondary | ICD-10-CM | POA: Diagnosis not present

## 2017-05-03 DIAGNOSIS — M5033 Other cervical disc degeneration, cervicothoracic region: Secondary | ICD-10-CM | POA: Diagnosis not present

## 2017-05-05 DIAGNOSIS — M9901 Segmental and somatic dysfunction of cervical region: Secondary | ICD-10-CM | POA: Diagnosis not present

## 2017-05-05 DIAGNOSIS — M9902 Segmental and somatic dysfunction of thoracic region: Secondary | ICD-10-CM | POA: Diagnosis not present

## 2017-05-05 DIAGNOSIS — M5033 Other cervical disc degeneration, cervicothoracic region: Secondary | ICD-10-CM | POA: Diagnosis not present

## 2017-05-05 DIAGNOSIS — M5414 Radiculopathy, thoracic region: Secondary | ICD-10-CM | POA: Diagnosis not present

## 2017-05-08 DIAGNOSIS — M9901 Segmental and somatic dysfunction of cervical region: Secondary | ICD-10-CM | POA: Diagnosis not present

## 2017-05-08 DIAGNOSIS — M5033 Other cervical disc degeneration, cervicothoracic region: Secondary | ICD-10-CM | POA: Diagnosis not present

## 2017-05-08 DIAGNOSIS — M5414 Radiculopathy, thoracic region: Secondary | ICD-10-CM | POA: Diagnosis not present

## 2017-05-08 DIAGNOSIS — M9902 Segmental and somatic dysfunction of thoracic region: Secondary | ICD-10-CM | POA: Diagnosis not present

## 2017-05-10 DIAGNOSIS — M9901 Segmental and somatic dysfunction of cervical region: Secondary | ICD-10-CM | POA: Diagnosis not present

## 2017-05-10 DIAGNOSIS — M9902 Segmental and somatic dysfunction of thoracic region: Secondary | ICD-10-CM | POA: Diagnosis not present

## 2017-05-10 DIAGNOSIS — M5414 Radiculopathy, thoracic region: Secondary | ICD-10-CM | POA: Diagnosis not present

## 2017-05-10 DIAGNOSIS — M5033 Other cervical disc degeneration, cervicothoracic region: Secondary | ICD-10-CM | POA: Diagnosis not present

## 2017-05-12 DIAGNOSIS — M5033 Other cervical disc degeneration, cervicothoracic region: Secondary | ICD-10-CM | POA: Diagnosis not present

## 2017-05-12 DIAGNOSIS — M9901 Segmental and somatic dysfunction of cervical region: Secondary | ICD-10-CM | POA: Diagnosis not present

## 2017-05-12 DIAGNOSIS — M9902 Segmental and somatic dysfunction of thoracic region: Secondary | ICD-10-CM | POA: Diagnosis not present

## 2017-05-12 DIAGNOSIS — M5414 Radiculopathy, thoracic region: Secondary | ICD-10-CM | POA: Diagnosis not present

## 2017-05-21 ENCOUNTER — Other Ambulatory Visit: Payer: Self-pay | Admitting: Family Medicine

## 2017-05-22 DIAGNOSIS — M5414 Radiculopathy, thoracic region: Secondary | ICD-10-CM | POA: Diagnosis not present

## 2017-05-22 DIAGNOSIS — M9901 Segmental and somatic dysfunction of cervical region: Secondary | ICD-10-CM | POA: Diagnosis not present

## 2017-05-22 DIAGNOSIS — M5033 Other cervical disc degeneration, cervicothoracic region: Secondary | ICD-10-CM | POA: Diagnosis not present

## 2017-05-22 DIAGNOSIS — M9902 Segmental and somatic dysfunction of thoracic region: Secondary | ICD-10-CM | POA: Diagnosis not present

## 2017-05-24 ENCOUNTER — Ambulatory Visit (INDEPENDENT_AMBULATORY_CARE_PROVIDER_SITE_OTHER): Payer: Medicare Other | Admitting: Psychology

## 2017-05-24 DIAGNOSIS — F4323 Adjustment disorder with mixed anxiety and depressed mood: Secondary | ICD-10-CM

## 2017-05-29 ENCOUNTER — Other Ambulatory Visit: Payer: Self-pay | Admitting: Family Medicine

## 2017-05-30 ENCOUNTER — Telehealth: Payer: Self-pay

## 2017-05-30 DIAGNOSIS — M5033 Other cervical disc degeneration, cervicothoracic region: Secondary | ICD-10-CM | POA: Diagnosis not present

## 2017-05-30 DIAGNOSIS — M9902 Segmental and somatic dysfunction of thoracic region: Secondary | ICD-10-CM | POA: Diagnosis not present

## 2017-05-30 DIAGNOSIS — M9901 Segmental and somatic dysfunction of cervical region: Secondary | ICD-10-CM | POA: Diagnosis not present

## 2017-05-30 DIAGNOSIS — M5414 Radiculopathy, thoracic region: Secondary | ICD-10-CM | POA: Diagnosis not present

## 2017-05-30 NOTE — Telephone Encounter (Signed)
Last refill was on 04/24/17, #60 no refills, please advise, thanks

## 2017-05-30 NOTE — Telephone Encounter (Signed)
PA denied, under part D medicare due to medical condition of dorsalgia unspecific, back pain. They have preferred alternatives of flexeril and celebrex, which per her chart she has not tried. I did tell them that she was stable on this drug since 2013.    Please advise, thanks

## 2017-05-30 NOTE — Telephone Encounter (Signed)
PA received from Pharmacy, called [facility], completed over the phone with Clara.  Pended to the pharmacy to review, PA #[id number].  thanks

## 2017-05-30 NOTE — Telephone Encounter (Signed)
Please inform patient. Please see if she is willing to try an alternative or if she has tried alternatives prior to now. Thanks.

## 2017-06-06 DIAGNOSIS — M5414 Radiculopathy, thoracic region: Secondary | ICD-10-CM | POA: Diagnosis not present

## 2017-06-06 DIAGNOSIS — M5033 Other cervical disc degeneration, cervicothoracic region: Secondary | ICD-10-CM | POA: Diagnosis not present

## 2017-06-06 DIAGNOSIS — M9902 Segmental and somatic dysfunction of thoracic region: Secondary | ICD-10-CM | POA: Diagnosis not present

## 2017-06-06 DIAGNOSIS — M9901 Segmental and somatic dysfunction of cervical region: Secondary | ICD-10-CM | POA: Diagnosis not present

## 2017-06-20 DIAGNOSIS — M5414 Radiculopathy, thoracic region: Secondary | ICD-10-CM | POA: Diagnosis not present

## 2017-06-20 DIAGNOSIS — M9901 Segmental and somatic dysfunction of cervical region: Secondary | ICD-10-CM | POA: Diagnosis not present

## 2017-06-20 DIAGNOSIS — M5033 Other cervical disc degeneration, cervicothoracic region: Secondary | ICD-10-CM | POA: Diagnosis not present

## 2017-06-20 DIAGNOSIS — M9902 Segmental and somatic dysfunction of thoracic region: Secondary | ICD-10-CM | POA: Diagnosis not present

## 2017-06-21 DIAGNOSIS — H6123 Impacted cerumen, bilateral: Secondary | ICD-10-CM | POA: Diagnosis not present

## 2017-06-21 DIAGNOSIS — H903 Sensorineural hearing loss, bilateral: Secondary | ICD-10-CM | POA: Diagnosis not present

## 2017-06-22 ENCOUNTER — Ambulatory Visit: Payer: Medicare Other | Admitting: Psychology

## 2017-06-26 ENCOUNTER — Other Ambulatory Visit: Payer: Self-pay | Admitting: Family Medicine

## 2017-06-27 ENCOUNTER — Ambulatory Visit (INDEPENDENT_AMBULATORY_CARE_PROVIDER_SITE_OTHER): Payer: Medicare Other | Admitting: Pulmonary Disease

## 2017-06-27 ENCOUNTER — Encounter: Payer: Self-pay | Admitting: Pulmonary Disease

## 2017-06-27 VITALS — BP 126/70 | HR 65 | Ht 65.0 in | Wt 160.6 lb

## 2017-06-27 DIAGNOSIS — J432 Centrilobular emphysema: Secondary | ICD-10-CM | POA: Diagnosis not present

## 2017-06-27 DIAGNOSIS — I2510 Atherosclerotic heart disease of native coronary artery without angina pectoris: Secondary | ICD-10-CM

## 2017-06-27 DIAGNOSIS — F17200 Nicotine dependence, unspecified, uncomplicated: Secondary | ICD-10-CM

## 2017-06-27 MED ORDER — FLUTICASONE FUROATE-VILANTEROL 100-25 MCG/INH IN AEPB: 1.0000 | INHALATION_SPRAY | Freq: Every day | RESPIRATORY_TRACT | 0 refills | Status: DC

## 2017-06-27 MED ORDER — ALBUTEROL SULFATE HFA 108 (90 BASE) MCG/ACT IN AERS: 1.0000 | INHALATION_SPRAY | Freq: Four times a day (QID) | RESPIRATORY_TRACT | 5 refills | Status: DC | PRN

## 2017-06-27 MED ORDER — FLUTICASONE FUROATE-VILANTEROL 100-25 MCG/INH IN AEPB: 1.0000 | INHALATION_SPRAY | Freq: Every day | RESPIRATORY_TRACT | 5 refills | Status: DC

## 2017-06-27 NOTE — Patient Instructions (Signed)
Tobacco abuse: I strongly recommend that you quit smoking  For your COPD: I recommend you continue taking Breo and Spiriva Stop taking Symbicort Use ProAir or albuterol nebulizer as needed for chest tightness, wheezing and dyspnea  We will see you back in 6 months or sooner if needed

## 2017-06-27 NOTE — Progress Notes (Signed)
Subjective:    Patient ID: Jodi Beasley, female    DOB: 1946/07/07, 71 y.o.   MRN: [id number]  Synopsis: Jodi Beasley established care with the [facility] pulmonary clinic in May 2013 for COPD. She simple spirometry at that time with a ratio of 71%, the flow volume loop consistent with obstruction and an FEV1 of 1.92 L or 77% predicted. She was followed previously by Dr. Efraim Kaufmann, a pulmonologist in the [place], [place] area. She had been treated for pneumonia 3 weeks prior to her initial visit.  She has smoked one half pack of cigarettes per day for 53 years and continues to smoke.  HPI Chief Complaint  Patient presents with  . Follow-up    pt c/o sob with exertion, PND, prod cough with clear mucus.  states this is worse in warm weather.  CAT: 5.    Jodi Beasley has "made a few changes" in that she has decided to continue smoking cigarettes and quit taking her COPD medicines.  She says that she was having repeated episodes of bronchitis and she thought this was due to her Symbicort and Flonase.  She has only used Spiriva periodically and she has not used her albuterol nebulizer.  She only has dyspnea when she is exercising a lot in the heat.  However she is not limited by dyspnea.  She coughs from time to time when she has sinus congestion and a runny nose.  She has been taking an over the counter  Generic antihistamine.    She is taking CBD oil drops for joint pain which she says helps.  It helps her sinus disease at all.    Past Medical History:  Diagnosis Date  . Arrhythmia   . Chicken pox   . COPD (chronic obstructive pulmonary disease) ([place])   . Coronary artery disease    NSTEMI in 09/2008. Cath : 80% RCA and 95% first diagonal. PCI and 2 DES placement  RCA and 1 DES to diagonal. Complicated by acute diagonal stent thrombosis . Treated by thrombectomy. Most recent nuclear stress test in 2010 showed no ischemia with normal EF.   Marland Kitchen Depression   . GERD (gastroesophageal reflux  disease)   . Hay fever   . History of blood transfusion   . History of hypercholesterolemia   . History of syncope 2000   negative EP study  . Hyperlipidemia    intolerance to statins except Crestor.   . Hypertension   . Hypothyroid   . MI (myocardial infarction) ([place])   . Pneumonia, organism unspecified(486)     Review of Systems  Constitutional: Negative for fatigue, fever and unexpected weight change.  HENT: Negative for congestion, postnasal drip, rhinorrhea and sinus pressure.   Respiratory: Negative for cough, choking and shortness of breath.   Cardiovascular: Negative for chest pain and leg swelling.       Objective:   Physical Exam Vitals:   06/27/17 1144  BP: 126/70  Pulse: 65  SpO2: 95%  Weight: 160 lb 9.6 oz (72.8 kg)  Height: 5\' 5"  (1.651 m)   room air  Gen: well appearing HENT: OP clear, TM's clear, neck supple PULM: CTA B, normal percussion CV: RRR, no mgr, trace edema GI: BS+, soft, nontender Derm: no cyanosis or rash Psyche: normal mood and affect   PFT: 04/09/2012 Simple Spirometry: Ratio 71%, FEV1 1.92L (77% pred); flow volume loop consistent with obstruction 03/2016 PFT> FEV1 1.73 L (74%, > 30% change with bronchodilator)  Records from her PCP reviewed from  May were she was seen for back pain. Physical therapy as recommended per     Assessment & Plan:   No diagnosis found.  Discussion: Unfortunately Jodi Beasley has decided to continue smoking cigarettes and stop all of her controller medicines for COPD. She thinks that this is the fix for her recurrent episodes of bronchitis. I told her on several different occasions in many different ways today that she is incorrect in this line of thinking and she needs to stop smoking cigarettes and in fact take her medicines again. I asked her to stop taking Symbicort as she's concerned that this is contributing to her bronchitis and we will replace it with Breo.  Plan: Tobacco abuse: I strongly recommend that  you quit smoking  For your COPD: I recommend you continue taking Breo and Spiriva Stop taking Symbicort Use ProAir or albuterol nebulizer as needed for chest tightness, wheezing and dyspnea  We will see you back in 6 months or sooner if needed   Updated Medication List Outpatient Encounter Prescriptions as of 06/27/2017  Medication Sig  . albuterol (PROVENTIL) (2.5 MG/3ML) 0.083% nebulizer solution Take 3 mLs (2.5 mg total) by nebulization every 6 (six) hours as needed for wheezing or shortness of breath.  Marland Kitchen aspirin 81 MG tablet Take 81 mg by mouth daily.  Marland Kitchen atorvastatin (LIPITOR) 80 MG tablet Take 1 tablet (80 mg total) by mouth daily.  Marland Kitchen buPROPion (WELLBUTRIN XL) 150 MG 24 hr tablet TAKE 1 TABLET(150 MG) BY MOUTH DAILY  . carisoprodol (SOMA) 350 MG tablet TAKE 1 TABLET BY MOUTH TWICE DAILY AS NEEDED FOR MUSCLE SPASMS  . Cholecalciferol (VITAMIN D-3) 1000 UNITS CAPS Take 1 capsule by mouth daily.  . citalopram (CELEXA) 40 MG tablet TAKE 1 TABLET(40 MG) BY MOUTH DAILY  . Coenzyme Q10 (COQ10) 400 MG CAPS Take 400 mg by mouth daily.  Marland Kitchen levothyroxine (SYNTHROID, LEVOTHROID) 112 MCG tablet TAKE 1 TABLET BY MOUTH EVERY DAY BEFORE BREAKFAST  . metoprolol tartrate (LOPRESSOR) 100 MG tablet TAKE 1 TABLET(100 MG) BY MOUTH TWICE DAILY  . Multiple Vitamin (MULTIVITAMIN) capsule Take 1 capsule by mouth daily.  . nitroGLYCERIN (NITROSTAT) 0.4 MG SL tablet Place 1 tablet (0.4 mg total) under the tongue every 5 (five) minutes as needed.  Marland Kitchen omeprazole (PRILOSEC) 20 MG capsule TAKE 1 CAPSULE BY MOUTH EVERY DAY  . UNABLE TO FIND Med Name: Sinus and Allergy relief PE Per box directions as needed  . tiotropium (SPIRIVA HANDIHALER) 18 MCG inhalation capsule Place 18 mcg into inhaler and inhale daily.  . [DISCONTINUED] budesonide-formoterol (SYMBICORT) 160-4.5 MCG/ACT inhaler Inhale 2 puffs into the lungs 2 (two) times daily. (Patient not taking: Reported on 06/27/2017)  . [DISCONTINUED] diphenhydrAMINE  (BENADRYL) 25 MG tablet Take 25 mg by mouth every 6 (six) hours as needed.  . [DISCONTINUED] fluticasone (FLONASE) 50 MCG/ACT nasal spray USE TWO SPRAYS EACH NOSTRIL EVERY DAY  (Patient not taking: Reported on 06/27/2017)  . [DISCONTINUED] ondansetron (ZOFRAN ODT) 4 MG disintegrating tablet Take 1 tablet (4 mg total) by mouth every 6 (six) hours as needed for nausea or vomiting. (Patient not taking: Reported on 06/27/2017)   No facility-administered encounter medications on file as of 06/27/2017.

## 2017-07-06 ENCOUNTER — Other Ambulatory Visit: Payer: Self-pay | Admitting: Family Medicine

## 2017-07-06 DIAGNOSIS — M5414 Radiculopathy, thoracic region: Secondary | ICD-10-CM | POA: Diagnosis not present

## 2017-07-06 DIAGNOSIS — M5033 Other cervical disc degeneration, cervicothoracic region: Secondary | ICD-10-CM | POA: Diagnosis not present

## 2017-07-06 DIAGNOSIS — M9901 Segmental and somatic dysfunction of cervical region: Secondary | ICD-10-CM | POA: Diagnosis not present

## 2017-07-06 DIAGNOSIS — M9902 Segmental and somatic dysfunction of thoracic region: Secondary | ICD-10-CM | POA: Diagnosis not present

## 2017-07-06 NOTE — Telephone Encounter (Signed)
Last OV 04/27/17 last filled 05/30/17 60 0rf

## 2017-07-06 NOTE — Telephone Encounter (Signed)
faxed

## 2017-07-10 DIAGNOSIS — L812 Freckles: Secondary | ICD-10-CM | POA: Diagnosis not present

## 2017-07-10 DIAGNOSIS — Z1283 Encounter for screening for malignant neoplasm of skin: Secondary | ICD-10-CM | POA: Diagnosis not present

## 2017-07-10 DIAGNOSIS — L820 Inflamed seborrheic keratosis: Secondary | ICD-10-CM | POA: Diagnosis not present

## 2017-07-10 DIAGNOSIS — D1801 Hemangioma of skin and subcutaneous tissue: Secondary | ICD-10-CM | POA: Diagnosis not present

## 2017-07-10 DIAGNOSIS — L821 Other seborrheic keratosis: Secondary | ICD-10-CM | POA: Diagnosis not present

## 2017-07-10 DIAGNOSIS — R238 Other skin changes: Secondary | ICD-10-CM | POA: Diagnosis not present

## 2017-07-10 DIAGNOSIS — B078 Other viral warts: Secondary | ICD-10-CM | POA: Diagnosis not present

## 2017-07-10 DIAGNOSIS — D225 Melanocytic nevi of trunk: Secondary | ICD-10-CM | POA: Diagnosis not present

## 2017-07-14 DIAGNOSIS — M9901 Segmental and somatic dysfunction of cervical region: Secondary | ICD-10-CM | POA: Diagnosis not present

## 2017-07-14 DIAGNOSIS — M5414 Radiculopathy, thoracic region: Secondary | ICD-10-CM | POA: Diagnosis not present

## 2017-07-14 DIAGNOSIS — M9902 Segmental and somatic dysfunction of thoracic region: Secondary | ICD-10-CM | POA: Diagnosis not present

## 2017-07-14 DIAGNOSIS — M5033 Other cervical disc degeneration, cervicothoracic region: Secondary | ICD-10-CM | POA: Diagnosis not present

## 2017-07-24 DIAGNOSIS — M9902 Segmental and somatic dysfunction of thoracic region: Secondary | ICD-10-CM | POA: Diagnosis not present

## 2017-07-24 DIAGNOSIS — M5414 Radiculopathy, thoracic region: Secondary | ICD-10-CM | POA: Diagnosis not present

## 2017-07-24 DIAGNOSIS — M5033 Other cervical disc degeneration, cervicothoracic region: Secondary | ICD-10-CM | POA: Diagnosis not present

## 2017-07-24 DIAGNOSIS — M9901 Segmental and somatic dysfunction of cervical region: Secondary | ICD-10-CM | POA: Diagnosis not present

## 2017-07-26 ENCOUNTER — Encounter: Payer: Self-pay | Admitting: Family Medicine

## 2017-07-26 ENCOUNTER — Ambulatory Visit (INDEPENDENT_AMBULATORY_CARE_PROVIDER_SITE_OTHER): Payer: Medicare Other | Admitting: Family Medicine

## 2017-07-26 DIAGNOSIS — I2510 Atherosclerotic heart disease of native coronary artery without angina pectoris: Secondary | ICD-10-CM

## 2017-07-26 DIAGNOSIS — J0100 Acute maxillary sinusitis, unspecified: Secondary | ICD-10-CM | POA: Diagnosis not present

## 2017-07-26 DIAGNOSIS — J329 Chronic sinusitis, unspecified: Secondary | ICD-10-CM | POA: Insufficient documentation

## 2017-07-26 MED ORDER — AMOXICILLIN-POT CLAVULANATE 875-125 MG PO TABS: 1.0000 | ORAL_TABLET | Freq: Two times a day (BID) | ORAL | 0 refills | Status: DC

## 2017-07-26 NOTE — Progress Notes (Signed)
  [name], MD Phone: [phone]  Jodi Beasley is a 71 y.o. female who presents today for same-day visit.  Patient notes for the last 10 days she's had sinus and nasal congestion. She's blowing green mucus out of her nose. She has some chest congestion now. Coughing up clear/green mucus. Some shortness of breath earlier this week which responded to albuterol. Occurred when she was coughing. She's been taking Coricidin and Mucinex. No fevers.  PMH: Smoker   ROS see history of present illness  Objective  Physical Exam Vitals:   07/26/17 1110  BP: 120/80  Pulse: (!) 59  Temp: 98.9 F (37.2 C)  SpO2: 95%    BP Readings from Last 3 Encounters:  07/26/17 120/80  06/27/17 126/70  04/27/17 128/80   Wt Readings from Last 3 Encounters:  07/26/17 160 lb 12.8 oz (72.9 kg)  06/27/17 160 lb 9.6 oz (72.8 kg)  04/27/17 159 lb 12.8 oz (72.5 kg)    Physical Exam  Constitutional: No distress.  HENT:  Head: Normocephalic and atraumatic.  Mouth/Throat: Oropharynx is clear and moist. No oropharyngeal exudate.  Normal TMs  Eyes: Pupils are equal, round, and reactive to light. Conjunctivae are normal.  Cardiovascular: Normal rate, regular rhythm and normal heart sounds.   Pulmonary/Chest: Effort normal and breath sounds normal.  Neurological: She is alert.  Skin: Skin is warm and dry. She is not diaphoretic.     Assessment/Plan: Please see individual problem list.  Sinusitis Patient with likely acute bacterial sinusitis given persistence of symptoms over 7 days. She has a benign lung exam. She does have a history of COPD though no wheezing. Discussed trying prednisone in addition to Augmentin though she wanted to stick just with the antibiotic. She'll continue her albuterol inhaler. If not starting to improve by the end of the week she will contact us to consider prednisone. She is given return precautions.   No orders of the defined types were placed in this  encounter.   Meds ordered this encounter  Medications  . amoxicillin-clavulanate (AUGMENTIN) 875-125 MG tablet    Sig: Take 1 tablet by mouth 2 (two) times daily.    Dispense:  14 tablet    Refill:  0   [name], MD [place]

## 2017-07-26 NOTE — Patient Instructions (Signed)
Nice to see you. We will treat you with Augmentin for sinusitis. If you develop cough productive of blood, worsening breathing issues, fevers, or any new or changing symptoms please seek medical attention immediately.

## 2017-07-26 NOTE — Assessment & Plan Note (Signed)
Patient with likely acute bacterial sinusitis given persistence of symptoms over 7 days. She has a benign lung exam. She does have a history of COPD though no wheezing. Discussed trying prednisone in addition to Augmentin though she wanted to stick just with the antibiotic. She'll continue her albuterol inhaler. If not starting to improve by the end of the week she will contact us to consider prednisone. She is given return precautions.

## 2017-08-01 ENCOUNTER — Other Ambulatory Visit: Payer: Self-pay | Admitting: Family Medicine

## 2017-08-08 DIAGNOSIS — M9901 Segmental and somatic dysfunction of cervical region: Secondary | ICD-10-CM | POA: Diagnosis not present

## 2017-08-08 DIAGNOSIS — M5033 Other cervical disc degeneration, cervicothoracic region: Secondary | ICD-10-CM | POA: Diagnosis not present

## 2017-08-08 DIAGNOSIS — M9903 Segmental and somatic dysfunction of lumbar region: Secondary | ICD-10-CM | POA: Diagnosis not present

## 2017-08-08 DIAGNOSIS — M5416 Radiculopathy, lumbar region: Secondary | ICD-10-CM | POA: Diagnosis not present

## 2017-08-14 ENCOUNTER — Other Ambulatory Visit: Payer: Self-pay | Admitting: Family Medicine

## 2017-08-23 ENCOUNTER — Encounter: Payer: Self-pay | Admitting: Family Medicine

## 2017-08-23 ENCOUNTER — Ambulatory Visit (INDEPENDENT_AMBULATORY_CARE_PROVIDER_SITE_OTHER): Payer: Medicare Other | Admitting: Family Medicine

## 2017-08-23 VITALS — BP 116/68 | HR 55 | Temp 98.2°F | Wt 161.4 lb

## 2017-08-23 DIAGNOSIS — Z23 Encounter for immunization: Secondary | ICD-10-CM

## 2017-08-23 DIAGNOSIS — E039 Hypothyroidism, unspecified: Secondary | ICD-10-CM | POA: Diagnosis not present

## 2017-08-23 DIAGNOSIS — J449 Chronic obstructive pulmonary disease, unspecified: Secondary | ICD-10-CM | POA: Diagnosis not present

## 2017-08-23 DIAGNOSIS — F4323 Adjustment disorder with mixed anxiety and depressed mood: Secondary | ICD-10-CM | POA: Diagnosis not present

## 2017-08-23 DIAGNOSIS — I2510 Atherosclerotic heart disease of native coronary artery without angina pectoris: Secondary | ICD-10-CM

## 2017-08-23 LAB — TSH: TSH: 0.96 u[IU]/mL (ref 0.35–4.50)

## 2017-08-23 NOTE — Assessment & Plan Note (Signed)
Improved from prior. She'll continue Wellbutrin and Celexa. Continue to monitor.

## 2017-08-23 NOTE — Patient Instructions (Signed)
Nice to see you. Please continue to monitor your COPD and breathing. Please monitor your anxiety and depression as well. If this worsens please let us know. We'll check a TSH today.

## 2017-08-23 NOTE — Assessment & Plan Note (Signed)
Stable. Continue current inhalers. Continue to follow with pulmonology. Urged smoking cessation.

## 2017-08-23 NOTE — Progress Notes (Signed)
  [name], MD Phone: [phone]  Jodi Beasley is a 71 y.o. female who presents today for follow-up.  COPD: Patient notes this is stable. She does have occasional dyspnea though this is not often. Typically occurs when she is vacuuming and there is dust or when she is outside and there is dust or pollen. No wheezing. Occasional cough. Stable on inhalers.  Hypothyroidism: Taking Synthroid. Due for TSH. Previously slightly elevated.  CAD: Taking Lipitor, aspirin, and metoprolol. Has used nitroglycerin once in the last 9 months. No chest pain. Her cholesterol responded quite well to the Lipitor.  Anxiety/depression: Notes she is still grieving from the loss of a family member prior to losing her mother. She's okay following the death of her mother given that it was a long illness. She takes Wellbutrin and Celexa. She is getting back to meditation and traveling. No SI or HI.  She got a new hearing aid which has been of benefit. Her shoulders are also not bothering her anymore. She takes 2 ibuprofen before going to bed.  PMH: Smoker. Not interested in quitting.   ROS see history of present illness  Objective  Physical Exam Vitals:   08/23/17 1039  BP: 116/68  Pulse: (!) 55  Temp: 98.2 F (36.8 C)  SpO2: 95%    BP Readings from Last 3 Encounters:  08/23/17 116/68  07/26/17 120/80  06/27/17 126/70   Wt Readings from Last 3 Encounters:  08/23/17 161 lb 6.4 oz (73.2 kg)  07/26/17 160 lb 12.8 oz (72.9 kg)  06/27/17 160 lb 9.6 oz (72.8 kg)    Physical Exam  Constitutional: No distress.  Cardiovascular: Normal rate, regular rhythm and normal heart sounds.   Pulmonary/Chest: Effort normal and breath sounds normal.  Musculoskeletal: She exhibits no edema.  Neurological: She is alert. Gait normal.  Skin: Skin is warm and dry. She is not diaphoretic.     Assessment/Plan: Please see individual problem list.  Coronary artery disease Asymptomatic. Continue current  medications. Discussed that ibuprofen does put her at risk for some cardiac issues. Encouraged to limit use of this.  COPD (chronic obstructive pulmonary disease) with chronic bronchitis (HCC) Stable. Continue current inhalers. Continue to follow with pulmonology. Urged smoking cessation.  Hypothyroidism Check TSH.  Adjustment disorder with mixed anxiety and depressed mood Improved from prior. She'll continue Wellbutrin and Celexa. Continue to monitor.  Monitor shoulder discomfort. Monitor hearing with hearing aids.  Orders Placed This Encounter  Procedures  . Flu vaccine HIGH DOSE PF  . TSH   [name], MD [place]

## 2017-08-23 NOTE — Assessment & Plan Note (Signed)
Check TSH 

## 2017-08-23 NOTE — Assessment & Plan Note (Addendum)
Asymptomatic. Continue current medications. Discussed that ibuprofen does put her at risk for some cardiac issues. Encouraged to limit use of this.

## 2017-08-29 ENCOUNTER — Other Ambulatory Visit: Payer: Self-pay | Admitting: Family Medicine

## 2017-08-29 NOTE — Telephone Encounter (Signed)
Please advise for refill, last fill was 07/06/17, #60 no refills, last OV was 08/23/17, thanks

## 2017-09-05 DIAGNOSIS — M5033 Other cervical disc degeneration, cervicothoracic region: Secondary | ICD-10-CM | POA: Diagnosis not present

## 2017-09-05 DIAGNOSIS — M9901 Segmental and somatic dysfunction of cervical region: Secondary | ICD-10-CM | POA: Diagnosis not present

## 2017-09-05 DIAGNOSIS — M9903 Segmental and somatic dysfunction of lumbar region: Secondary | ICD-10-CM | POA: Diagnosis not present

## 2017-09-05 DIAGNOSIS — M5416 Radiculopathy, lumbar region: Secondary | ICD-10-CM | POA: Diagnosis not present

## 2017-09-13 NOTE — Addendum Note (Signed)
Addended by: Johna Sheriff on: 09/13/2017 10:56 AM   Modules accepted: Orders

## 2017-09-25 ENCOUNTER — Ambulatory Visit (INDEPENDENT_AMBULATORY_CARE_PROVIDER_SITE_OTHER): Payer: Medicare Other | Admitting: Psychology

## 2017-09-25 ENCOUNTER — Other Ambulatory Visit: Payer: Self-pay | Admitting: Family Medicine

## 2017-09-25 DIAGNOSIS — F4323 Adjustment disorder with mixed anxiety and depressed mood: Secondary | ICD-10-CM

## 2017-10-10 DIAGNOSIS — M5033 Other cervical disc degeneration, cervicothoracic region: Secondary | ICD-10-CM | POA: Diagnosis not present

## 2017-10-10 DIAGNOSIS — M9901 Segmental and somatic dysfunction of cervical region: Secondary | ICD-10-CM | POA: Diagnosis not present

## 2017-10-10 DIAGNOSIS — M5416 Radiculopathy, lumbar region: Secondary | ICD-10-CM | POA: Diagnosis not present

## 2017-10-10 DIAGNOSIS — M9903 Segmental and somatic dysfunction of lumbar region: Secondary | ICD-10-CM | POA: Diagnosis not present

## 2017-10-23 ENCOUNTER — Ambulatory Visit: Payer: Medicare Other | Admitting: Psychology

## 2017-10-23 ENCOUNTER — Other Ambulatory Visit: Payer: Self-pay | Admitting: Family Medicine

## 2017-10-25 ENCOUNTER — Other Ambulatory Visit: Payer: Self-pay

## 2017-10-25 ENCOUNTER — Telehealth: Payer: Self-pay | Admitting: Family Medicine

## 2017-10-25 MED ORDER — CARISOPRODOL 350 MG PO TABS: ORAL_TABLET | ORAL | 0 refills | Status: DC

## 2017-10-25 NOTE — Telephone Encounter (Signed)
Please advise 

## 2017-10-25 NOTE — Telephone Encounter (Signed)
Last OV 08/23/17 last filled 10/24/17 by Kerin Salen under Dr.Sonnenebrg but this has to be printed, signed and faxed.

## 2017-10-25 NOTE — Telephone Encounter (Signed)
It was printed and approved by [facility] under DR.Sonnenebrg, this needs to be approved and printed by a different physician

## 2017-10-25 NOTE — Telephone Encounter (Signed)
rx ok'd for soma bid prn #60 no refills.  Looks like she is taking regularly.

## 2017-10-25 NOTE — Telephone Encounter (Signed)
faxed

## 2017-10-25 NOTE — Telephone Encounter (Signed)
Copied from [place] [id number]. Topic: Inquiry >> Oct 25, 2017 11:35 AM Oliver Pila B wrote: Reason for CRM: PT called b/c she contacted the pharmacy and the pharmacy said that her carisoprodol was denied and wants to know why, please contact and advise pt

## 2017-10-30 ENCOUNTER — Other Ambulatory Visit: Payer: Self-pay | Admitting: Family Medicine

## 2017-10-30 DIAGNOSIS — M9903 Segmental and somatic dysfunction of lumbar region: Secondary | ICD-10-CM | POA: Diagnosis not present

## 2017-10-30 DIAGNOSIS — M9901 Segmental and somatic dysfunction of cervical region: Secondary | ICD-10-CM | POA: Diagnosis not present

## 2017-10-30 DIAGNOSIS — M5416 Radiculopathy, lumbar region: Secondary | ICD-10-CM | POA: Diagnosis not present

## 2017-10-30 DIAGNOSIS — M5033 Other cervical disc degeneration, cervicothoracic region: Secondary | ICD-10-CM | POA: Diagnosis not present

## 2017-10-31 DIAGNOSIS — M9903 Segmental and somatic dysfunction of lumbar region: Secondary | ICD-10-CM | POA: Diagnosis not present

## 2017-10-31 DIAGNOSIS — M5033 Other cervical disc degeneration, cervicothoracic region: Secondary | ICD-10-CM | POA: Diagnosis not present

## 2017-10-31 DIAGNOSIS — M9901 Segmental and somatic dysfunction of cervical region: Secondary | ICD-10-CM | POA: Diagnosis not present

## 2017-10-31 DIAGNOSIS — M5416 Radiculopathy, lumbar region: Secondary | ICD-10-CM | POA: Diagnosis not present

## 2017-11-02 DIAGNOSIS — M5416 Radiculopathy, lumbar region: Secondary | ICD-10-CM | POA: Diagnosis not present

## 2017-11-02 DIAGNOSIS — M5033 Other cervical disc degeneration, cervicothoracic region: Secondary | ICD-10-CM | POA: Diagnosis not present

## 2017-11-02 DIAGNOSIS — M9903 Segmental and somatic dysfunction of lumbar region: Secondary | ICD-10-CM | POA: Diagnosis not present

## 2017-11-02 DIAGNOSIS — M9901 Segmental and somatic dysfunction of cervical region: Secondary | ICD-10-CM | POA: Diagnosis not present

## 2017-11-06 ENCOUNTER — Other Ambulatory Visit: Payer: Self-pay

## 2017-11-06 ENCOUNTER — Encounter: Payer: Self-pay | Admitting: Family Medicine

## 2017-11-06 ENCOUNTER — Ambulatory Visit (INDEPENDENT_AMBULATORY_CARE_PROVIDER_SITE_OTHER): Payer: Medicare Other | Admitting: Family Medicine

## 2017-11-06 VITALS — BP 126/84 | HR 63 | Temp 97.9°F | Wt 160.6 lb

## 2017-11-06 DIAGNOSIS — M5033 Other cervical disc degeneration, cervicothoracic region: Secondary | ICD-10-CM | POA: Diagnosis not present

## 2017-11-06 DIAGNOSIS — M79643 Pain in unspecified hand: Secondary | ICD-10-CM | POA: Diagnosis not present

## 2017-11-06 DIAGNOSIS — G8929 Other chronic pain: Secondary | ICD-10-CM

## 2017-11-06 DIAGNOSIS — M5442 Lumbago with sciatica, left side: Secondary | ICD-10-CM

## 2017-11-06 DIAGNOSIS — I2510 Atherosclerotic heart disease of native coronary artery without angina pectoris: Secondary | ICD-10-CM | POA: Diagnosis not present

## 2017-11-06 DIAGNOSIS — M9903 Segmental and somatic dysfunction of lumbar region: Secondary | ICD-10-CM | POA: Diagnosis not present

## 2017-11-06 DIAGNOSIS — M9901 Segmental and somatic dysfunction of cervical region: Secondary | ICD-10-CM | POA: Diagnosis not present

## 2017-11-06 DIAGNOSIS — M5416 Radiculopathy, lumbar region: Secondary | ICD-10-CM | POA: Diagnosis not present

## 2017-11-06 MED ORDER — HYDROCODONE-ACETAMINOPHEN 5-325 MG PO TABS: 1.0000 | ORAL_TABLET | Freq: Four times a day (QID) | ORAL | 0 refills | Status: DC | PRN

## 2017-11-06 NOTE — Assessment & Plan Note (Signed)
Exacerbation of chronic issue.  Patient is somewhat limited in what she can take for her back pain given prior cardiac issues.  She will continue going to the chiropractor.  Her left leg does appear to be about half an inch longer than the right leg.  We will refer to sports medicine to consider heel lifts.  She will discontinue Motrin.  Discussed limiting Tylenol to 3000 mg/day or less.  Trial of Vicodin for back pain.  Discussed that this would not be a chronic medication.

## 2017-11-06 NOTE — Patient Instructions (Signed)
Nice to see you. We will try a short trial of Vicodin for discomfort.  Will refer you to sports medicine. If you have worsening pain please be evaluated.  Please limit your Tylenol use to less than 3000 mg daily.

## 2017-11-06 NOTE — Assessment & Plan Note (Signed)
Suspect osteoarthritis.  Short course of Vicodin for pain for her back will likely provide some benefit.

## 2017-11-06 NOTE — Progress Notes (Signed)
Jodi Rumps, MD Phone: [phone]  Jodi Beasley is a 71 y.o. female who presents today for follow-up.  Patient notes low back pain exacerbation starting on Thanksgiving.  Has been going on for over a week.  She tried lidocaine for her back with little benefit.  She has also been taking Motrin which makes her stomach upset.  She has been taking Tylenol 625 mg every 2 hours intermittently.  Has also been using lidocaine.  More than advised from over-the-counter packaging.  She has been going to the chiropractor for adjustments daily.  She notes that has been helping.  Pain is not quite as bad as it was previously though she has not been able to get any relief.  She denies numbness, weakness, loss of bowel or bladder function, and saddle anesthesia.  Notes she feels as though it is related to 1 of her legs being shorter than the other.  Does note her knuckles bilaterally have been bothering her in her hands.  They ache all the time.  There is stiff.  She has been slathering lidocaine ointment on them.  Social History   Tobacco Use  Smoking Status Current Every Day Smoker  . Packs/day: 1.00  . Years: 53.00  . Pack years: 53.00  . Types: E-cigarettes, Cigarettes  . Start date: 11/19/1960  Smokeless Tobacco Never Used  Tobacco Comment   down to 0.75ppd     ROS see history of present illness  Objective  Physical Exam Vitals:   11/06/17 1425  BP: 126/84  Pulse: 63  Temp: 97.9 F (36.6 C)  SpO2: 98%    BP Readings from Last 3 Encounters:  11/06/17 126/84  08/23/17 116/68  07/26/17 120/80   Wt Readings from Last 3 Encounters:  11/06/17 160 lb 9.6 oz (72.8 kg)  08/23/17 161 lb 6.4 oz (73.2 kg)  07/26/17 160 lb 12.8 oz (72.9 kg)    Physical Exam  Constitutional: No distress.  Cardiovascular: Normal rate, regular rhythm and normal heart sounds.  Pulmonary/Chest: Effort normal.  Musculoskeletal: She exhibits no edema.  No midline spine tenderness, no midline spine  step-off, no muscular back tenderness, bilateral hands with no joint swelling, tenderness, warmth, or erythema  Neurological: She is alert.  5/5 strength bilateral quads, hamstrings, plantar flexion, and dorsiflexion, sensation light touch intact bilateral lower extremities  Skin: Skin is warm and dry. She is not diaphoretic.  No skin changes overlying lumbar spine     Assessment/Plan: Please see individual problem list.  Back pain Exacerbation of chronic issue.  Patient is somewhat limited in what she can take for her back pain given prior cardiac issues.  She will continue going to the chiropractor.  Her left leg does appear to be about half an inch longer than the right leg.  We will refer to sports medicine to consider heel lifts.  She will discontinue Motrin.  Discussed limiting Tylenol to 3000 mg/day or less.  Trial of Vicodin for back pain.  Discussed that this would not be a chronic medication.  Hand pain Suspect osteoarthritis.  Short course of Vicodin for pain for her back will likely provide some benefit.   [name] was seen today for back pain.  Diagnoses and all orders for this visit:  Chronic bilateral low back pain with left-sided sciatica -     Ambulatory referral to Sports Medicine  Pain of hand, unspecified laterality  Other orders -     HYDROcodone-acetaminophen (NORCO/VICODIN) 5-325 MG tablet; Take 1 tablet by mouth every 6 (  six) hours as needed for moderate pain.  Drowsiness precautions given regarding Vicodin.  She will not take with the [facility].  Orders Placed This Encounter  Procedures  . Ambulatory referral to Sports Medicine    Referral Priority:   Routine    Referral Type:   Consultation    Number of Visits Requested:   1    Meds ordered this encounter  Medications  . HYDROcodone-acetaminophen (NORCO/VICODIN) 5-325 MG tablet    Sig: Take 1 tablet by mouth every 6 (six) hours as needed for moderate pain.    Dispense:  20 tablet    Refill:  0      Jodi Rumps, MD [place]

## 2017-11-07 DIAGNOSIS — M5033 Other cervical disc degeneration, cervicothoracic region: Secondary | ICD-10-CM | POA: Diagnosis not present

## 2017-11-07 DIAGNOSIS — M5416 Radiculopathy, lumbar region: Secondary | ICD-10-CM | POA: Diagnosis not present

## 2017-11-07 DIAGNOSIS — M9901 Segmental and somatic dysfunction of cervical region: Secondary | ICD-10-CM | POA: Diagnosis not present

## 2017-11-07 DIAGNOSIS — M9903 Segmental and somatic dysfunction of lumbar region: Secondary | ICD-10-CM | POA: Diagnosis not present

## 2017-11-08 DIAGNOSIS — M9901 Segmental and somatic dysfunction of cervical region: Secondary | ICD-10-CM | POA: Diagnosis not present

## 2017-11-08 DIAGNOSIS — M5416 Radiculopathy, lumbar region: Secondary | ICD-10-CM | POA: Diagnosis not present

## 2017-11-08 DIAGNOSIS — M9903 Segmental and somatic dysfunction of lumbar region: Secondary | ICD-10-CM | POA: Diagnosis not present

## 2017-11-08 DIAGNOSIS — M5033 Other cervical disc degeneration, cervicothoracic region: Secondary | ICD-10-CM | POA: Diagnosis not present

## 2017-11-09 ENCOUNTER — Other Ambulatory Visit: Payer: Self-pay | Admitting: Family Medicine

## 2017-11-09 ENCOUNTER — Encounter: Payer: Self-pay | Admitting: Family Medicine

## 2017-11-10 ENCOUNTER — Telehealth: Payer: Self-pay | Admitting: Family Medicine

## 2017-11-10 DIAGNOSIS — M5416 Radiculopathy, lumbar region: Secondary | ICD-10-CM | POA: Diagnosis not present

## 2017-11-10 DIAGNOSIS — M9903 Segmental and somatic dysfunction of lumbar region: Secondary | ICD-10-CM | POA: Diagnosis not present

## 2017-11-10 DIAGNOSIS — M5033 Other cervical disc degeneration, cervicothoracic region: Secondary | ICD-10-CM | POA: Diagnosis not present

## 2017-11-10 DIAGNOSIS — M9901 Segmental and somatic dysfunction of cervical region: Secondary | ICD-10-CM | POA: Diagnosis not present

## 2017-11-10 MED ORDER — HYDROCODONE-ACETAMINOPHEN 5-325 MG PO TABS: 1.0000 | ORAL_TABLET | Freq: Four times a day (QID) | ORAL | 0 refills | Status: DC | PRN

## 2017-11-10 NOTE — Telephone Encounter (Signed)
Patient calling checking status. Call back [phone]

## 2017-11-10 NOTE — Telephone Encounter (Signed)
Patient notified

## 2017-11-10 NOTE — Telephone Encounter (Signed)
Please contact the patient regarding her mychart message. I will provide with a short term vicodin refill. If not improving further she will need to be re-evaluated next week. Thanks.

## 2017-11-15 ENCOUNTER — Ambulatory Visit: Payer: Self-pay

## 2017-11-15 ENCOUNTER — Telehealth: Payer: Self-pay

## 2017-11-15 DIAGNOSIS — M9903 Segmental and somatic dysfunction of lumbar region: Secondary | ICD-10-CM | POA: Diagnosis not present

## 2017-11-15 DIAGNOSIS — M5416 Radiculopathy, lumbar region: Secondary | ICD-10-CM | POA: Diagnosis not present

## 2017-11-15 DIAGNOSIS — M9901 Segmental and somatic dysfunction of cervical region: Secondary | ICD-10-CM | POA: Diagnosis not present

## 2017-11-15 DIAGNOSIS — M5033 Other cervical disc degeneration, cervicothoracic region: Secondary | ICD-10-CM | POA: Diagnosis not present

## 2017-11-15 NOTE — Telephone Encounter (Signed)
Noted.  Patient should see the surgeon tomorrow and be reevaluated by our nurse practitioner depending on what the surgeon advises.

## 2017-11-15 NOTE — Telephone Encounter (Signed)
Pt has h/o severe back pain. Pt called to report that she was running low on pain medicine and needed to know what she needed to do. Pt has an appointment with  Neurosurgeon (Dr. Cari Caraway with [facility]) tomorrow. Made pt appt with Clarene Reamer NP for Friday 12/14 @ 0830. No appts available for [employer].   Care advice given.  Reason for Disposition . [1] Pain radiates into the thigh or further down the leg AND [2] one leg  Answer Assessment - Initial Assessment Questions 1. ONSET: "When did the pain begin?"  Week of Thanksgiving 2. LOCATION: "Where does it hurt?" (upper, mid or lower back)     Lower back  Cannot lie or sit on left side 3. SEVERITY: "How bad is the pain?"  (e.g., Scale 1-10; mild, moderate, or severe)   - MILD (1-3): doesn't interfere with normal activities    - MODERATE (4-7): interferes with normal activities or awakens from sleep    - SEVERE (8-10): excruciating pain, unable to do any normal activities      8/10 4. PATTERN: "Is the pain constant?" (e.g., yes, no; constant, intermittent)      yes 5. RADIATION: "Does the pain shoot into your legs or elsewhere?"     Constant pain that stops at her knee 6. CAUSE:  "What do you think is causing the back pain?"      Says the vertebrae has shifted (L2 and L3) 7. BACK OVERUSE:  "Any recent lifting of heavy objects, strenuous work or exercise?"     no 8. MEDICATIONS: "What have you taken so far for the pain?" (e.g., nothing, acetaminophen, NSAIDS)     Prescription pain med, aspercreme with lidocaine 9. NEUROLOGIC SYMPTOMS: "Do you have any weakness, numbness, or problems with bowel/bladder control?"     no 10. OTHER SYMPTOMS: "Do you have any other symptoms?" (e.g., fever, abdominal pain, burning with urination, blood in urine)       no 11. PREGNANCY: "Is there any chance you are pregnant?" (e.g., yes, no; LMP)       n/a  Protocols used: BACK PAIN-A-AH

## 2017-11-15 NOTE — Telephone Encounter (Signed)
Copied from [place] [id number]. Topic: Appointment Scheduling - Scheduling Inquiry for Clinic >> Nov 14, 2017  9:06 AM Ether Griffins B wrote: Reason for CRM: pt needing to be seen for follow up with back pain. Dr. Caryl Bis told pt to follow up end of the week or first of the week if its no better

## 2017-11-15 NOTE — Telephone Encounter (Signed)
fyi

## 2017-11-16 DIAGNOSIS — M9903 Segmental and somatic dysfunction of lumbar region: Secondary | ICD-10-CM | POA: Diagnosis not present

## 2017-11-16 DIAGNOSIS — M9901 Segmental and somatic dysfunction of cervical region: Secondary | ICD-10-CM | POA: Diagnosis not present

## 2017-11-16 DIAGNOSIS — M545 Low back pain: Secondary | ICD-10-CM | POA: Diagnosis not present

## 2017-11-16 DIAGNOSIS — M5033 Other cervical disc degeneration, cervicothoracic region: Secondary | ICD-10-CM | POA: Diagnosis not present

## 2017-11-16 DIAGNOSIS — M5416 Radiculopathy, lumbar region: Secondary | ICD-10-CM | POA: Diagnosis not present

## 2017-11-17 ENCOUNTER — Encounter: Payer: Self-pay | Admitting: Family Medicine

## 2017-11-17 ENCOUNTER — Ambulatory Visit (INDEPENDENT_AMBULATORY_CARE_PROVIDER_SITE_OTHER): Payer: Medicare Other | Admitting: Family Medicine

## 2017-11-17 VITALS — BP 124/82 | HR 77 | Temp 98.2°F | Wt 158.5 lb

## 2017-11-17 DIAGNOSIS — Z720 Tobacco use: Secondary | ICD-10-CM | POA: Diagnosis not present

## 2017-11-17 DIAGNOSIS — M5442 Lumbago with sciatica, left side: Secondary | ICD-10-CM

## 2017-11-17 DIAGNOSIS — G8929 Other chronic pain: Secondary | ICD-10-CM | POA: Diagnosis not present

## 2017-11-17 DIAGNOSIS — I2510 Atherosclerotic heart disease of native coronary artery without angina pectoris: Secondary | ICD-10-CM | POA: Diagnosis not present

## 2017-11-17 MED ORDER — HYDROCODONE-ACETAMINOPHEN 5-325 MG PO TABS: 1.0000 | ORAL_TABLET | Freq: Four times a day (QID) | ORAL | 0 refills | Status: DC | PRN

## 2017-11-17 NOTE — Patient Instructions (Signed)
It was a pleasure to see you today  Please call your surgeon's office and request referral to pain management  Please follow up with Dr. Caryl Bis

## 2017-11-17 NOTE — Progress Notes (Signed)
Subjective:    Patient ID: Jodi Beasley, female    DOB: 1946/05/18, 71 y.o.   MRN: [id number]  HPI This is a 71 yo female who presents today with continued back pain, Has been seen multiple times for this, most recently by [facility] specialist yesterday (Dr. Izora Ribas) who has recommended L4-5 transforaminal lumbar interbody fusion pending smoking cessation x 30 days and cardiology clearance. She plans to quit smoking around Jan. 1 and has called her cardiologist for appointment.   Today she presents requesting pain medication. She has been taking 3-4 Norco 5-325 daily. Was given #20 11/10/17. Also occasionally takes [place] which she has taken for years. According to patient, she is going to be referred to pain management by her surgeon, she needs to call his assistant today for referral. Her surgeon declined giving her pain medication because PCP had been prescribing, she was unable to get in with PCP for several weeks and is concerned about being without any pain medication over the holidays. Pain is a constant ache, norco "takes the edge off." She has been told not to take NSAIDs because of her heart. Had been taking large doses but did not really help.   Has tried to quit smoking multiple times in past, unable to use patch due to skin irritation, can't chew gum due to dental work, has tried hypnosis x 2. She is anticipating a very hard Christmas as her mother passed away earlier this year.    Past Medical History:  Diagnosis Date  . Arrhythmia   . Chicken pox   . COPD (chronic obstructive pulmonary disease) ([place])   . Coronary artery disease    NSTEMI in 09/2008. Cath : 80% RCA and 95% first diagonal. PCI and 2 DES placement  RCA and 1 DES to diagonal. Complicated by acute diagonal stent thrombosis . Treated by thrombectomy. Most recent nuclear stress test in 2010 showed no ischemia with normal EF.   Mar[place] Kitchen Depression   . GERD (gastroesophageal reflux disease)   . Hay fever   . History of blood  transfusion   . History of hypercholesterolemia   . History of syncope 2000   negative EP study  . Hyperlipidemia    intolerance to statins except Crestor.   . Hypertension   . Hypothyroid   . MI (myocardial infarction) ([place])   . Pneumonia, organism unspecified(486)    Past Surgical History:  Procedure Laterality Date  . CORONARY ANGIOPLASTY WITH STENT PLACEMENT  2009   [facility], drug-eluting mid RCA,prox diag;  . TONSILLECTOMY    . TUBAL LIGATION     Family History  Problem Relation Age of Onset  . Colon cancer Father   . Lung cancer Father        was a former smoker  . Cancer Father        lung  . Hypertension Mother   . Hypertension Unknown   . Diabetes Unknown    Social History   Tobacco Use  . Smoking status: Current Every Day Smoker    Packs/day: 1.00    Years: 53.00    Pack years: 53.00    Types: E-cigarettes, Cigarettes    Start date: 11/19/1960  . Smokeless tobacco: Never Used  . Tobacco comment: down to 0.75ppd  Substance Use Topics  . Alcohol use: Yes    Alcohol/week: 0.6 oz    Types: 1 Standard drinks or equivalent per week  . Drug use: No      Review of Systems Per HPI  Objective:   Physical Exam  Constitutional: She is oriented to person, place, and time. She appears well-developed and well-nourished. No distress.  Eyes: Conjunctivae are normal.  Cardiovascular: Normal rate.  Pulmonary/Chest: Effort normal.  Neurological: She is alert and oriented to person, place, and time.  Skin: She is not diaphoretic.  Psychiatric: She has a normal mood and affect. Her behavior is normal. Judgment and thought content normal.  Vitals reviewed.     BP 124/82   Pulse 77   Temp 98.2 F (36.8 C) (Oral)   Wt 158 lb 8 oz (71.9 kg)   SpO2 98%   BMI 26.38 kg/m  Wt Readings from Last 3 Encounters:  11/17/17 158 lb 8 oz (71.9 kg)  11/06/17 160 lb 9.6 oz (72.8 kg)  08/23/17 161 lb 6.4 oz (73.2 kg)       Assessment & Plan:  1. Chronic  bilateral low back pain with left-sided sciatica - discussed need for long term management of pain and encouraged her to get immediate referral for pain management from her surgeon's office. It will likely be at least 6-8 weeks at a minimum before she is able to have surgery.  - I agreed to provide small amount of hydrocodone-acetaminophen on a one time basis and told her that she will need to follow up with PCP until she can get into pain management - HYDROcodone-acetaminophen (NORCO/VICODIN) 5-325 MG tablet; Take 1 tablet by mouth every 6 (six) hours as needed for moderate pain.  Dispense: 20 tablet; Refill: 0 - she will continue chiropractic care as well as ice  2. Tobacco abuse disorder - encouraged her to consider sooner smoking cessation   Clarene Reamer, FNP-BC  [facility] [place]  11/17/2017 9:18 AM

## 2017-11-17 NOTE — Progress Notes (Signed)
Patient states she did call and left a message with nurse to make an appointment.   Patient states she would like an rx for her hydrocodone to have to fill during christmas because she will run out next Thursday.

## 2017-11-20 DIAGNOSIS — M5033 Other cervical disc degeneration, cervicothoracic region: Secondary | ICD-10-CM | POA: Diagnosis not present

## 2017-11-20 DIAGNOSIS — M5416 Radiculopathy, lumbar region: Secondary | ICD-10-CM | POA: Diagnosis not present

## 2017-11-20 DIAGNOSIS — M9903 Segmental and somatic dysfunction of lumbar region: Secondary | ICD-10-CM | POA: Diagnosis not present

## 2017-11-20 DIAGNOSIS — M9901 Segmental and somatic dysfunction of cervical region: Secondary | ICD-10-CM | POA: Diagnosis not present

## 2017-11-22 ENCOUNTER — Ambulatory Visit: Payer: Self-pay | Admitting: Podiatry

## 2017-11-22 ENCOUNTER — Telehealth: Payer: Self-pay | Admitting: Family Medicine

## 2017-11-22 DIAGNOSIS — G8929 Other chronic pain: Secondary | ICD-10-CM

## 2017-11-22 DIAGNOSIS — M9901 Segmental and somatic dysfunction of cervical region: Secondary | ICD-10-CM | POA: Diagnosis not present

## 2017-11-22 DIAGNOSIS — M5033 Other cervical disc degeneration, cervicothoracic region: Secondary | ICD-10-CM | POA: Diagnosis not present

## 2017-11-22 DIAGNOSIS — M5416 Radiculopathy, lumbar region: Secondary | ICD-10-CM | POA: Diagnosis not present

## 2017-11-22 DIAGNOSIS — M5442 Lumbago with sciatica, left side: Principal | ICD-10-CM

## 2017-11-22 DIAGNOSIS — M9903 Segmental and somatic dysfunction of lumbar region: Secondary | ICD-10-CM | POA: Diagnosis not present

## 2017-11-22 MED ORDER — HYDROCODONE-ACETAMINOPHEN 5-325 MG PO TABS
1.0000 | ORAL_TABLET | Freq: Four times a day (QID) | ORAL | 0 refills | Status: DC | PRN
Start: 2017-11-22 — End: 2017-12-12

## 2017-11-22 NOTE — Telephone Encounter (Signed)
Copied from [place]. Topic: Quick Communication - See Telephone Encounter >> Nov 22, 2017 10:26 AM Percell Belt A wrote: CRM for notification. See Telephone encounter for:  pt called in and said that she has a pain management appt jan 8th and would like to have a refill on her pain meds until she can get in with pain management?       11/22/17.

## 2017-11-22 NOTE — Telephone Encounter (Signed)
Please advise 

## 2017-11-22 NOTE — Telephone Encounter (Signed)
Patient notified rx is at front desk

## 2017-11-22 NOTE — Telephone Encounter (Signed)
Can be refilled, though she will need to keep the appointment with pain management and further management of her pain medication will need to come from them. Thanks.

## 2017-11-24 DIAGNOSIS — M5033 Other cervical disc degeneration, cervicothoracic region: Secondary | ICD-10-CM | POA: Diagnosis not present

## 2017-11-24 DIAGNOSIS — M9903 Segmental and somatic dysfunction of lumbar region: Secondary | ICD-10-CM | POA: Diagnosis not present

## 2017-11-24 DIAGNOSIS — M5416 Radiculopathy, lumbar region: Secondary | ICD-10-CM | POA: Diagnosis not present

## 2017-11-24 DIAGNOSIS — M9901 Segmental and somatic dysfunction of cervical region: Secondary | ICD-10-CM | POA: Diagnosis not present

## 2017-11-27 DIAGNOSIS — M5033 Other cervical disc degeneration, cervicothoracic region: Secondary | ICD-10-CM | POA: Diagnosis not present

## 2017-11-27 DIAGNOSIS — M9903 Segmental and somatic dysfunction of lumbar region: Secondary | ICD-10-CM | POA: Diagnosis not present

## 2017-11-27 DIAGNOSIS — M5416 Radiculopathy, lumbar region: Secondary | ICD-10-CM | POA: Diagnosis not present

## 2017-11-27 DIAGNOSIS — M9901 Segmental and somatic dysfunction of cervical region: Secondary | ICD-10-CM | POA: Diagnosis not present

## 2017-11-30 DIAGNOSIS — M9903 Segmental and somatic dysfunction of lumbar region: Secondary | ICD-10-CM | POA: Diagnosis not present

## 2017-11-30 DIAGNOSIS — M5033 Other cervical disc degeneration, cervicothoracic region: Secondary | ICD-10-CM | POA: Diagnosis not present

## 2017-11-30 DIAGNOSIS — M9901 Segmental and somatic dysfunction of cervical region: Secondary | ICD-10-CM | POA: Diagnosis not present

## 2017-11-30 DIAGNOSIS — M5416 Radiculopathy, lumbar region: Secondary | ICD-10-CM | POA: Diagnosis not present

## 2017-12-04 DIAGNOSIS — M9903 Segmental and somatic dysfunction of lumbar region: Secondary | ICD-10-CM | POA: Diagnosis not present

## 2017-12-04 DIAGNOSIS — M5416 Radiculopathy, lumbar region: Secondary | ICD-10-CM | POA: Diagnosis not present

## 2017-12-04 DIAGNOSIS — M9901 Segmental and somatic dysfunction of cervical region: Secondary | ICD-10-CM | POA: Diagnosis not present

## 2017-12-04 DIAGNOSIS — M5033 Other cervical disc degeneration, cervicothoracic region: Secondary | ICD-10-CM | POA: Diagnosis not present

## 2017-12-07 DIAGNOSIS — M9903 Segmental and somatic dysfunction of lumbar region: Secondary | ICD-10-CM | POA: Diagnosis not present

## 2017-12-07 DIAGNOSIS — M5416 Radiculopathy, lumbar region: Secondary | ICD-10-CM | POA: Diagnosis not present

## 2017-12-07 DIAGNOSIS — M9901 Segmental and somatic dysfunction of cervical region: Secondary | ICD-10-CM | POA: Diagnosis not present

## 2017-12-07 DIAGNOSIS — M5033 Other cervical disc degeneration, cervicothoracic region: Secondary | ICD-10-CM | POA: Diagnosis not present

## 2017-12-11 DIAGNOSIS — M5416 Radiculopathy, lumbar region: Secondary | ICD-10-CM | POA: Diagnosis not present

## 2017-12-11 DIAGNOSIS — M9901 Segmental and somatic dysfunction of cervical region: Secondary | ICD-10-CM | POA: Diagnosis not present

## 2017-12-11 DIAGNOSIS — M5033 Other cervical disc degeneration, cervicothoracic region: Secondary | ICD-10-CM | POA: Diagnosis not present

## 2017-12-11 DIAGNOSIS — M9903 Segmental and somatic dysfunction of lumbar region: Secondary | ICD-10-CM | POA: Diagnosis not present

## 2017-12-12 ENCOUNTER — Other Ambulatory Visit: Payer: Self-pay

## 2017-12-12 ENCOUNTER — Ambulatory Visit: Payer: Medicare Other | Attending: Nurse Practitioner | Admitting: Nurse Practitioner

## 2017-12-12 ENCOUNTER — Encounter: Payer: Self-pay | Admitting: Nurse Practitioner

## 2017-12-12 VITALS — BP 147/85 | HR 57 | Temp 98.3°F | Resp 16 | Ht 65.0 in | Wt 157.0 lb

## 2017-12-12 DIAGNOSIS — E039 Hypothyroidism, unspecified: Secondary | ICD-10-CM | POA: Diagnosis not present

## 2017-12-12 DIAGNOSIS — M654 Radial styloid tenosynovitis [de Quervain]: Secondary | ICD-10-CM | POA: Insufficient documentation

## 2017-12-12 DIAGNOSIS — M79605 Pain in left leg: Secondary | ICD-10-CM | POA: Diagnosis not present

## 2017-12-12 DIAGNOSIS — G894 Chronic pain syndrome: Secondary | ICD-10-CM | POA: Insufficient documentation

## 2017-12-12 DIAGNOSIS — Z7982 Long term (current) use of aspirin: Secondary | ICD-10-CM | POA: Diagnosis not present

## 2017-12-12 DIAGNOSIS — Z79899 Other long term (current) drug therapy: Secondary | ICD-10-CM | POA: Diagnosis not present

## 2017-12-12 DIAGNOSIS — F4323 Adjustment disorder with mixed anxiety and depressed mood: Secondary | ICD-10-CM | POA: Insufficient documentation

## 2017-12-12 DIAGNOSIS — E7800 Pure hypercholesterolemia, unspecified: Secondary | ICD-10-CM | POA: Insufficient documentation

## 2017-12-12 DIAGNOSIS — M48061 Spinal stenosis, lumbar region without neurogenic claudication: Secondary | ICD-10-CM | POA: Insufficient documentation

## 2017-12-12 DIAGNOSIS — M5136 Other intervertebral disc degeneration, lumbar region: Secondary | ICD-10-CM | POA: Diagnosis not present

## 2017-12-12 DIAGNOSIS — I10 Essential (primary) hypertension: Secondary | ICD-10-CM | POA: Diagnosis not present

## 2017-12-12 DIAGNOSIS — M5442 Lumbago with sciatica, left side: Secondary | ICD-10-CM

## 2017-12-12 DIAGNOSIS — I252 Old myocardial infarction: Secondary | ICD-10-CM | POA: Insufficient documentation

## 2017-12-12 DIAGNOSIS — I2510 Atherosclerotic heart disease of native coronary artery without angina pectoris: Secondary | ICD-10-CM | POA: Insufficient documentation

## 2017-12-12 DIAGNOSIS — K219 Gastro-esophageal reflux disease without esophagitis: Secondary | ICD-10-CM | POA: Insufficient documentation

## 2017-12-12 DIAGNOSIS — Z79891 Long term (current) use of opiate analgesic: Secondary | ICD-10-CM | POA: Diagnosis not present

## 2017-12-12 DIAGNOSIS — G8929 Other chronic pain: Secondary | ICD-10-CM | POA: Diagnosis not present

## 2017-12-12 DIAGNOSIS — M545 Low back pain: Secondary | ICD-10-CM | POA: Diagnosis present

## 2017-12-12 DIAGNOSIS — F329 Major depressive disorder, single episode, unspecified: Secondary | ICD-10-CM | POA: Diagnosis not present

## 2017-12-12 DIAGNOSIS — I700 Atherosclerosis of aorta: Secondary | ICD-10-CM | POA: Insufficient documentation

## 2017-12-12 DIAGNOSIS — F17210 Nicotine dependence, cigarettes, uncomplicated: Secondary | ICD-10-CM | POA: Insufficient documentation

## 2017-12-12 DIAGNOSIS — M25562 Pain in left knee: Secondary | ICD-10-CM | POA: Diagnosis present

## 2017-12-12 DIAGNOSIS — M899 Disorder of bone, unspecified: Secondary | ICD-10-CM | POA: Diagnosis not present

## 2017-12-12 DIAGNOSIS — J449 Chronic obstructive pulmonary disease, unspecified: Secondary | ICD-10-CM | POA: Diagnosis not present

## 2017-12-12 DIAGNOSIS — M5441 Lumbago with sciatica, right side: Secondary | ICD-10-CM

## 2017-12-12 DIAGNOSIS — Z789 Other specified health status: Secondary | ICD-10-CM | POA: Diagnosis not present

## 2017-12-12 DIAGNOSIS — M8580 Other specified disorders of bone density and structure, unspecified site: Secondary | ICD-10-CM | POA: Diagnosis not present

## 2017-12-12 NOTE — Progress Notes (Signed)
Safety precautions to be maintained throughout the outpatient stay will include: orient to surroundings, keep bed in low position, maintain call bell within reach at all times, provide assistance with transfer out of bed and ambulation.  

## 2017-12-12 NOTE — Patient Instructions (Signed)

## 2017-12-12 NOTE — Progress Notes (Signed)
Patient's Name: Jodi Beasley  MRN: [id number]  Referring Provider: Meade Maw, MD  DOB: March 16, 1946  PCP: Leone Haven, MD  DOS: 12/12/2017  Note by: Dionisio David NP  Service setting: Ambulatory outpatient  Specialty: Interventional Pain Management  Location: [facility] (AMB) Pain Management Facility    Patient type: New Patient    Primary Reason(s) for Visit: Initial Patient Evaluation CC: Back Pain (lower) and Knee Pain (left)  HPI  [name] is a 72 y.o. year old, female patient, who comes today for an initial evaluation. She has COPD (chronic obstructive pulmonary disease) with chronic bronchitis ([place]); Tobacco abuse; Coronary artery disease; Atrophic vaginitis; Hypertension; Allergic rhinitis; Hypothyroidism; Other and unspecified hyperlipidemia; Back pain; Adjustment disorder with mixed anxiety and depressed mood; De Quervain's tenosynovitis, right; Osteopenia; Hypercholesterolemia; Epidermal cyst; Hand pain; Hyponatremia; Rotator cuff impingement syndrome of right shoulder; Fall; Rash; Sinusitis; Chronic low back pain (Primary Area of Pain) (Bilateral) (L>R); Chronic pain of lower extremity (Secondary Area of Pain) (L); Long term current use of opiate analgesic; Chronic pain syndrome; Disorder of bone, unspecified; Other long term (current) drug therapy; and Other specified health status on their problem list.. Her primarily concern today is the Back Pain (lower) and Knee Pain (left)  Pain Assessment: Location: Lower Back Radiating: down side of left leg to knee Onset: More than a month ago Duration: Chronic pain Quality: Aching, Radiating Severity: 0 /10 (self-reported pain score)  Note: Reported level is compatible with observation.                          Effect on ADL: rest, goes to chir to align vert.  Timing: Constant Modifying factors: Chiropactor,medications  Onset and Duration: Sudden Cause of pain: fall Severity: Getting better Timing: Not influenced by the time  of the day Aggravating Factors: Bending, Climbing, Kneeling, Lifiting, Motion, Prolonged sitting, Squatting, Stooping  and Twisting Alleviating Factors: Medications, Resting, Warm showers or baths and Chiropractic manipulations Associated Problems: Depression and Pain that wakes patient up Quality of Pain: Agonizing, Constant, Disabling, Dreadful, Horrible, Sickening and Uncomfortable Previous Examinations or Tests: MRI scan, Neurological evaluation and Chiropractic evaluation Previous Treatments: Chiropractic manipulations and Narcotic medications  The patient comes into the clinics today for the first time for a chronic pain management evaluation. According to the patient her primary area of pain is in her lower back. She admits that she suffered a fall last January. She admits that the pain did not start until April. She admits that the low back pain is intermittent. She has been seen by chiropractor weekly for 3 week intervals. She admits that this is very effective for her pain management. She denies any previous surgery and is not interested in any surgery. She denies any previous interventional therapy or physical therapy. She has had a recent MRI.  Her second area of pain is in her lower extremities. She admits that is on the left side only. She states the pain radiates down into her knee. She denies any numbness tingling or weakness. She states that it is just pain only.   Today I took the time to provide the patient with information regarding this pain practice. The patient was informed that the practice is divided into two sections: an interventional pain management section, as well as a completely separate and distinct medication management section. I explained that there are procedure days for interventional therapies, and evaluation days for follow-ups and medication management. Because of the amount of documentation  required during both, they are kept separated. This means that there is  the possibility that she may be scheduled for a procedure on one day, and medication management the next. I have also informed her that because of staffing and facility limitations, this practice will no longer take patients for medication management only. To illustrate the reasons for this, I gave the patient the example of surgeons, and how inappropriate it would be to refer a patient to his/her care, just to write for the post-surgical antibiotics on a surgery done by a different surgeon.   Because interventional pain management is part of the board-certified specialty for the doctors, the patient was informed that joining this practice means that they are open to any and all interventional therapies. I made it clear that this does not mean that they will be forced to have any procedures done. What this means is that I believe interventional therapies to be essential part of the diagnosis and proper management of chronic pain conditions. Therefore, patients not interested in these interventional alternatives will be better served under the care of a different practitioner.  The patient was also made aware of my Comprehensive Pain Management Safety Guidelines where by joining this practice, they limit all of their nerve blocks and joint injections to those done by our practice, for as long as we are retained to manage their care. Historic Controlled Substance Pharmacotherapy Review  PMP and historical list of controlled substances: Hydrocodone/acetaminophen 5/325 mg, Carisoprodol 350 mg, oxycodone/acetaminophen 7.5/325 mg, diazepam 5 mg, hydrocodone Chlorphen suspension, hydrocodone/acetaminophen 5/300 mg, Highest opioid analgesic regimen found: Oxycodone/acetaminophen 7.5/325 every 4 hours (fill date 04/13/2017) oxycodone 45 mg per day Most recent opioid analgesic: Hydrocodone/acetaminophen 5/325 mg 4 times daily Current opioid analgesics: None Highest recorded MME/day: 67.5 mg/day MME/day:  81m/day Medications: The patient did not bring the medication(s) to the appointment, as requested in our "New Patient Package" Pharmacodynamics: Desired effects: Analgesia: The patient reports >50% benefit. Reported improvement in function: The patient reports medication allows her to accomplish basic ADLs. Clinically meaningful improvement in function (CMIF): Sustained CMIF goals met Perceived effectiveness: Described as relatively effective, allowing for increase in activities of daily living (ADL) Undesirable effects: Side-effects or Adverse reactions: None reported Historical Monitoring: The patient  reports that she does not use drugs. List of all UDS Test(s): No results found for: MDMA, COCAINSCRNUR, PCPSCRNUR, PCPQUANT, CANNABQUANT, THCU, EClackamasList of all Serum Drug Screening Test(s):  No results found for: AMPHSCRSER, BARBSCRSER, BENZOSCRSER, COCAINSCRSER, PCPSCRSER, PCPQUANT, THCSCRSER, CANNABQUANT, OPIATESCRSER, OXYSCRSER, PROPOXSCRSER Historical Background Evaluation: [place] PDMP: Six (6) year initial data search conducted.             [place] Department of public safety, offender search: (Editor, commissioningInformation) Non-contributory Risk Assessment Profile: Aberrant behavior: None observed or detected today Risk factors for fatal opioid overdose: None identified today Fatal overdose hazard ratio (HR): Calculation deferred Non-fatal overdose hazard ratio (HR): Calculation deferred Risk of opioid abuse or dependence: 0.7-3.0% with doses ? 36 MME/day and 6.1-26% with doses ? 120 MME/day. Substance use disorder (SUD) risk level: Pending results of Medical Psychology Evaluation for SUD Opioid risk tool (ORT) (Total Score): 5  ORT Scoring interpretation table:  Score <3 = Low Risk for SUD  Score between 4-7 = Moderate Risk for SUD  Score >8 = High Risk for Opioid Abuse   PHQ-2 Depression Scale:  Total score: 0  PHQ-2 Scoring interpretation table: (Score and probability of major depressive  disorder)  Score 0 = No depression  Score 1 = 15.4% Probability  Score 2 = 21.1% Probability  Score 3 = 38.4% Probability  Score 4 = 45.5% Probability  Score 5 = 56.4% Probability  Score 6 = 78.6% Probability   PHQ-9 Depression Scale:  Total score: 0  PHQ-9 Scoring interpretation table:  Score 0-4 = No depression  Score 5-9 = Mild depression  Score 10-14 = Moderate depression  Score 15-19 = Moderately severe depression  Score 20-27 = Severe depression (2.4 times higher risk of SUD and 2.89 times higher risk of overuse)   Pharmacologic Plan: Pending ordered tests and/or consults  Meds  The patient has a current medication list which includes the following prescription(s): albuterol, aspirin, atorvastatin, bupropion, carisoprodol, vitamin d-3, citalopram, coq10, fluticasone furoate-vilanterol, levothyroxine, metoprolol tartrate, multivitamin, nitroglycerin, omeprazole, and UNABLE TO FIND.  Current Outpatient Medications on File Prior to Visit  Medication Sig  . albuterol (PROAIR HFA) 108 (90 Base) MCG/ACT inhaler Inhale 1-2 puffs into the lungs every 6 (six) hours as needed for wheezing or shortness of breath.  Marland Kitchen aspirin 81 MG tablet Take 81 mg by mouth daily.  Marland Kitchen atorvastatin (LIPITOR) 80 MG tablet Take 1 tablet (80 mg total) by mouth daily.  Marland Kitchen buPROPion (WELLBUTRIN XL) 150 MG 24 hr tablet TAKE 1 TABLET(150 MG) BY MOUTH DAILY  . carisoprodol (SOMA) 350 MG tablet TAKE 1 TABLET BY MOUTH TWICE DAILY AS NEEDED FOR MUSCLE SPASMS  . Cholecalciferol (VITAMIN D-3) 1000 UNITS CAPS Take 1 capsule by mouth daily.  . citalopram (CELEXA) 40 MG tablet TAKE 1 TABLET(40 MG) BY MOUTH DAILY  . Coenzyme Q10 (COQ10) 400 MG CAPS Take 400 mg by mouth daily.  . fluticasone furoate-vilanterol (BREO ELLIPTA) 100-25 MCG/INH AEPB Inhale 1 puff into the lungs daily.  Marland Kitchen levothyroxine (SYNTHROID, LEVOTHROID) 112 MCG tablet TAKE 1 TABLET BY MOUTH EVERY DAY BEFORE BREAKFAST  . metoprolol tartrate (LOPRESSOR) 100 MG  tablet TAKE 1 TABLET(100 MG) BY MOUTH TWICE DAILY  . Multiple Vitamin (MULTIVITAMIN) capsule Take 1 capsule by mouth daily.  . nitroGLYCERIN (NITROSTAT) 0.4 MG SL tablet Place 1 tablet (0.4 mg total) under the tongue every 5 (five) minutes as needed.  Marland Kitchen omeprazole (PRILOSEC) 20 MG capsule TAKE 1 CAPSULE BY MOUTH EVERY DAY  . UNABLE TO FIND Med Name: Sinus and Allergy relief PE Per box directions as needed   No current facility-administered medications on file prior to visit.    Imaging Review  Shoulder Imaging:  Shoulder-R DG:  Results for orders placed in visit on 11/09/16  DG Shoulder Right   Narrative CLINICAL DATA:  Right shoulder pain. History of fall with a blow to the right shoulder 1.5 months ago. Initial encounter.  EXAM: RIGHT SHOULDER - 2+ VIEW  COMPARISON:  None.  FINDINGS: No acute bony or joint abnormality is identified. Mild acromioclavicular degenerative change is seen. 0.8 cm in diameter sclerotic lesion in the metaphysis of the humerus as appears most consistent with a benign enchondroma.  IMPRESSION: No acute abnormality.  Mild acromioclavicular osteoarthritis.   Electronically Signed   By: Inge Rise M.D.   On: 11/09/2016 15:27     Lumbosacral Imaging: Lumbar MR wo contrast:  Results for orders placed during the hospital encounter of 04/21/17  MR LUMBAR SPINE WO CONTRAST   Narrative CLINICAL DATA:  Low back pain  EXAM: MRI LUMBAR SPINE WITHOUT CONTRAST  TECHNIQUE: Multiplanar, multisequence MR imaging of the lumbar spine was performed. No intravenous contrast was administered.  COMPARISON:  Lumbar spine radiographs 03/25/2017  FINDINGS:  Segmentation:  Normal  Alignment: 5 mm anterolisthesis L4-5. Slight retrolisthesis L1-2 and L2-3  Vertebrae:  Negative for fracture or mass.  Normal bone marrow.  Conus medullaris: Extends to the L1-2 level and appears normal.  Paraspinal and other soft tissues: Left renal cyst 1 x 2 cm.  No retroperitoneal mass or adenopathy  Disc levels:  L1-2:  Disc bulging and mild facet degeneration without stenosis  L2-3: Mild disc degeneration and disc bulging. Mild facet degeneration without significant stenosis  L3-4: Mild disc bulging and moderate facet degeneration. No significant spinal stenosis.  L4-5: 5 mm anterolisthesis. Severe facet degeneration. Diffuse bulging of the disc. Moderate spinal stenosis. Moderate subarticular and foraminal stenosis bilaterally  L5-S1: Mild disc bulging. Bilateral facet degeneration. No significant stenosis.  IMPRESSION: 5 mm anterolisthesis L4-5. Moderate spinal stenosis. Moderate subarticular and foraminal stenosis bilaterally. Severe facet degeneration.  Mild disc and facet degeneration at L2-3, L3-4, L5-S1 without significant stenosis.   Electronically Signed   By: Franchot Gallo M.D.   On: 04/21/2017 13:18    Lumbar DG 2-3 views:  Results for orders placed during the hospital encounter of 03/25/17  DG Lumbar Spine 2-3 Views   Narrative CLINICAL DATA:  Chronic lower back pain, acutely worsened. Initial encounter.  EXAM: LUMBAR SPINE - 2-3 VIEW  COMPARISON:  None.  FINDINGS: There is no evidence of acute fracture or subluxation. There is grade 1 anterolisthesis of L4 on L5, with underlying facet disease. Vertebral bodies demonstrate normal height. Intervertebral disc spaces are preserved.  The visualized bowel gas pattern is unremarkable in appearance; air and stool are noted within the colon. The sacroiliac joints are within normal limits. Scattered calcification is seen along the abdominal aorta.  IMPRESSION: 1. No evidence of acute fracture or subluxation along the lumbar spine. 2. Grade 1 anterolisthesis of L4 on L5, with underlying facet disease. 3. Scattered aortic atherosclerosis.   Electronically Signed   By: Garald Balding M.D.   On: 03/25/2017 22:12     Note: Available results from prior  imaging studies were reviewed.        ROS  Cardiovascular History: Heart trouble, Daily Aspirin intake, Heart attack and Heart catheterization Pulmonary or Respiratory History: Smoking Neurological History: No reported neurological signs or symptoms such as seizures, abnormal skin sensations, urinary and/or fecal incontinence, being born with an abnormal open spine and/or a tethered spinal cord Review of Past Neurological Studies: No results found for this or any previous visit. Psychological-Psychiatric History: Anxiousness and Depressed Gastrointestinal History: No reported gastrointestinal signs or symptoms such as vomiting or evacuating blood, reflux, heartburn, alternating episodes of diarrhea and constipation, inflamed or scarred liver, or pancreas or irrregular and/or infrequent bowel movements Genitourinary History: No reported renal or genitourinary signs or symptoms such as difficulty voiding or producing urine, peeing blood, non-functioning kidney, kidney stones, difficulty emptying the bladder, difficulty controlling the flow of urine, or chronic kidney disease Hematological History: No reported hematological signs or symptoms such as prolonged bleeding, low or poor functioning platelets, bruising or bleeding easily, hereditary bleeding problems, low energy levels due to low hemoglobin or being anemic Endocrine History: High thyroid Rheumatologic History: No reported rheumatological signs and symptoms such as fatigue, joint pain, tenderness, swelling, redness, heat, stiffness, decreased range of motion, with or without associated rash Musculoskeletal History: Negative for myasthenia gravis, muscular dystrophy, multiple sclerosis or malignant hyperthermia Work History: Retired  Allergies  Ms. Jann is allergic to benzocaine; darvon [propoxyphene hcl]; monosodium glutamate; neosporin [neomycin-bacitracin zn-polymyx]; nicoderm [nicotine]; and  prednisone.  Laboratory Chemistry   Inflammation Markers No results found for: CRP, ESRSEDRATE (CRP: Acute Phase) (ESR: Chronic Phase) Renal Function Markers Lab Results  Component Value Date   BUN 10 02/23/2017   CREATININE 0.84 02/23/2017   GFRAA >60 07/26/2013   GFRNONAA >60 07/26/2013   Hepatic Function Markers Lab Results  Component Value Date   AST 18 04/10/2017   ALT 17 04/10/2017   ALBUMIN 4.3 04/10/2017   ALKPHOS 69 04/10/2017   HCVAB NEGATIVE 01/27/2016   Electrolytes Lab Results  Component Value Date   NA 135 02/23/2017   K 4.9 02/23/2017   CL 102 02/23/2017   CALCIUM 9.7 02/23/2017   Neuropathy Markers No results found for: [id number] Bone Pathology Markers Lab Results  Component Value Date   ALKPHOS 69 04/10/2017   CALCIUM 9.7 02/23/2017   Coagulation Parameters Lab Results  Component Value Date   PLT 287.0 11/25/2014   Cardiovascular Markers Lab Results  Component Value Date   HGB 14.2 11/25/2014   HCT 42.9 11/25/2014   Note: Lab results reviewed.  [place]  Drug: Ms. Leisey  reports that she does not use drugs. Alcohol:  reports that she drinks about 0.6 oz of alcohol per week. Tobacco:  reports that she has been smoking e-cigarettes and cigarettes.  She started smoking about 57 years ago. She has a 53.00 pack-year smoking history. she has never used smokeless tobacco. Medical:  has a past medical history of Arrhythmia, Chicken pox, COPD (chronic obstructive pulmonary disease) ([place]), Coronary artery disease, Depression, GERD (gastroesophageal reflux disease), Hay fever, History of blood transfusion, History of hypercholesterolemia, History of syncope (2000), Hyperlipidemia, Hypertension, Hypothyroid, MI (myocardial infarction) ([place]), and Pneumonia, organism unspecified(486). Family: family history includes Cancer in her father; Colon cancer in her father; Diabetes in her unknown relative; Hypertension in her mother and unknown relative; Lung cancer in her father.  Past Surgical  History:  Procedure Laterality Date  . CORONARY ANGIOPLASTY WITH STENT PLACEMENT  2009   [facility], drug-eluting mid RCA,prox diag;  . TONSILLECTOMY    . TUBAL LIGATION     Active Ambulatory Problems    Diagnosis Date Noted  . COPD (chronic obstructive pulmonary disease) with chronic bronchitis ([place]) 04/09/2012  . Tobacco abuse 04/09/2012  . Coronary artery disease 04/19/2012  . Atrophic vaginitis 04/19/2012  . Hypertension   . Allergic rhinitis 01/21/2013  . Hypothyroidism 01/24/2013  . Other and unspecified hyperlipidemia 01/24/2013  . Back pain 03/07/2013  . Adjustment disorder with mixed anxiety and depressed mood 11/19/2013  . De Quervain's tenosynovitis, right 01/16/2015  . Osteopenia 07/20/2015  . Hypercholesterolemia 10/21/2015  . Epidermal cyst 03/02/2016  . Hand pain 03/02/2016  . Hyponatremia 03/02/2016  . Rotator cuff impingement syndrome of right shoulder 09/26/2016  . Fall 02/23/2017  . Rash 03/31/2017  . Sinusitis 07/26/2017  . Chronic low back pain (Primary Area of Pain) (Bilateral) (L>R) 12/12/2017  . Chronic pain of lower extremity (Secondary Area of Pain) (L) 12/12/2017  . Long term current use of opiate analgesic 12/12/2017  . Chronic pain syndrome 12/12/2017  . Disorder of bone, unspecified 12/12/2017  . Other long term (current) drug therapy 12/12/2017  . Other specified health status 12/12/2017   Resolved Ambulatory Problems    Diagnosis Date Noted  . Pneumonia 04/09/2012  . COPD (chronic obstructive pulmonary disease) ([place]) 04/19/2012  . Hypothyroidism 04/19/2012  . Hyperlipidemia LDL goal < 70 04/19/2012  . Screening for skin cancer 04/19/2012  . Hyperlipidemia   . Anxiety 07/19/2012  .  Medicare annual wellness visit, initial 10/22/2012  . Sciatic pain 03/29/2013  . Medicare annual wellness visit, subsequent 11/19/2013  . Acute bronchitis 11/20/2013  . Sinusitis 02/07/2014  . Acute exacerbation of chronic obstructive pulmonary disease  (COPD) ([place]) 11/11/2014  . Sinusitis 08/18/2015   Past Medical History:  Diagnosis Date  . Arrhythmia   . Chicken pox   . COPD (chronic obstructive pulmonary disease) ([place])   . Coronary artery disease   . Depression   . GERD (gastroesophageal reflux disease)   . Hay fever   . History of blood transfusion   . History of hypercholesterolemia   . History of syncope 2000  . Hyperlipidemia   . Hypertension   . Hypothyroid   . MI (myocardial infarction) ([place])   . Pneumonia, organism unspecified(486)    Constitutional Exam  General appearance: Well nourished, well developed, and well hydrated. In no apparent acute distress Vitals:   12/12/17 1002  BP: (!) 147/85  Pulse: (!) 57  Resp: 16  Temp: 98.3 F (36.8 C)  SpO2: 99%  Weight: 157 lb (71.2 kg)  Height: '5\' 5"'  (1.651 m)   BMI Assessment: Estimated body mass index is 26.13 kg/m as calculated from the following:   Height as of this encounter: '5\' 5"'  (1.651 m).   Weight as of this encounter: 157 lb (71.2 kg).  BMI interpretation table: BMI level Category Range association with higher incidence of chronic pain  <18 kg/m2 Underweight   18.5-24.9 kg/m2 Ideal body weight   25-29.9 kg/m2 Overweight Increased incidence by 20%  30-34.9 kg/m2 Obese (Class I) Increased incidence by 68%  35-39.9 kg/m2 Severe obesity (Class II) Increased incidence by 136%  >40 kg/m2 Extreme obesity (Class III) Increased incidence by 254%   BMI Readings from Last 4 Encounters:  12/12/17 26.13 kg/m  11/17/17 26.38 kg/m  11/06/17 26.73 kg/m  08/23/17 26.86 kg/m   Wt Readings from Last 4 Encounters:  12/12/17 157 lb (71.2 kg)  11/17/17 158 lb 8 oz (71.9 kg)  11/06/17 160 lb 9.6 oz (72.8 kg)  08/23/17 161 lb 6.4 oz (73.2 kg)  Psych/Mental status: Alert, oriented x 3 (person, place, & time)       Eyes: PERLA Respiratory: No evidence of acute respiratory distress  Cervical Spine Exam  Inspection: No masses, redness, or swelling Alignment:  Symmetrical Functional ROM: Unrestricted ROM      Stability: No instability detected Muscle strength & Tone: Functionally intact Sensory: Unimpaired Palpation: No palpable anomalies              Upper Extremity (UE) Exam    Side: Right upper extremity  Side: Left upper extremity  Inspection: No masses, redness, swelling, or asymmetry. No contractures  Inspection: No masses, redness, swelling, or asymmetry. No contractures  Functional ROM: Unrestricted ROM          Functional ROM: Unrestricted ROM          Muscle strength & Tone: Functionally intact  Muscle strength & Tone: Functionally intact  Sensory: Unimpaired  Sensory: Unimpaired  Palpation: No palpable anomalies              Palpation: No palpable anomalies              Specialized Test(s): Deferred         Specialized Test(s): Deferred          Thoracic Spine Exam  Inspection: No masses, redness, or swelling Alignment: Symmetrical Functional ROM: Unrestricted ROM Stability: No instability detected Sensory: Unimpaired  Muscle strength & Tone: No palpable anomalies  Lumbar Spine Exam  Inspection: No masses, redness, or swelling Alignment: Symmetrical Functional ROM: Unrestricted ROM      Stability: No instability detected Muscle strength & Tone: Functionally intact Sensory: Unimpaired Palpation: Complains of area being tender to palpation       Provocative Tests: Lumbar Hyperextension and rotation test: Positive on the left for facet joint pain. Patrick's Maneuver: Positive for left-sided S-I arthralgia              Gait & Posture Assessment  Ambulation: Unassisted Gait: Relatively normal for age and body habitus Posture: WNL   Lower Extremity Exam    Side: Right lower extremity  Side: Left lower extremity  Inspection: No masses, redness, swelling, or asymmetry. No contractures  Inspection: No masses, redness, swelling, or asymmetry. No contractures  Functional ROM: Unrestricted ROM          Functional ROM: Unrestricted  ROM          Muscle strength & Tone: Able to Toe-walk & Heel-walk without problems  Muscle strength & Tone: Able to Toe-walk & Heel-walk without problems  Sensory: Unimpaired  Sensory: Unimpaired  Palpation: No palpable anomalies  Palpation: No palpable anomalies   Assessment  Primary Diagnosis & Pertinent Problem List: The primary encounter diagnosis was Chronic bilateral low back pain with left-sided sciatica. Diagnoses of Chronic pain of left lower extremity, Chronic pain syndrome, Long term current use of opiate analgesic, Disorder of bone, unspecified, Other long term (current) drug therapy, and Other specified health status were also pertinent to this visit.  Visit Diagnosis: 1. Chronic bilateral low back pain with left-sided sciatica   2. Chronic pain of left lower extremity   3. Chronic pain syndrome   4. Long term current use of opiate analgesic   5. Disorder of bone, unspecified   6. Other long term (current) drug therapy   7. Other specified health status    Plan of Care  Initial treatment plan:  Please be advised that as per protocol, today's visit has been an evaluation only. We have not taken over the patient's controlled substance management.  Problem-specific plan: No problem-specific Assessment & Plan notes found for this encounter.  Ordered Lab-work, Procedure(s), Referral(s), & Consult(s): Orders Placed This Encounter  Procedures  . Compliance Drug Analysis, Ur  . Comp. Metabolic Panel (12)  . Magnesium  . Vitamin B12  . Sedimentation rate  . 25-Hydroxyvitamin D Lcms D2+D3  . C-reactive protein  . Ambulatory referral to Psychology   Pharmacotherapy: Medications ordered:  No orders of the defined types were placed in this encounter.  Medications administered during this visit: Candee Hoon had no medications administered during this visit.   Pharmacotherapy under consideration:  Opioid Analgesics: The patient was informed that there is no guarantee  that she would be a candidate for opioid analgesics. The decision will be made following CDC guidelines. This decision will be based on the results of diagnostic studies, as well as Ms. Lorman risk profile.  Membrane stabilizer: To be determined at a later time Muscle relaxant: To be determined at a later time NSAID: To be determined at a later time Other analgesic(s): To be determined at a later time   Interventional therapies under consideration: Ms. Caldwell was informed that there is no guarantee that she would be a candidate for interventional therapies. The decision will be based on the results of diagnostic studies, as well as Ms. Lansdowne risk profile.  Possible procedure(s): Diagnostic  left LESI Diagnostic bilateral lumbar facet nerve block Possible bilateral lumbar facet RFA    Provider-requested follow-up: Return for 2nd Visit, w/ Dr. Dossie Arbour, after [facility] eval.  Future Appointments  Date Time Provider [place]  12/20/2017 10:00 AM Velora Heckler [facility]  12/20/2017 10:30 AM Garrel Ridgel, DPM [facility]  12/29/2017 11:30 AM Juanito Doom, MD [facility] None  01/29/2018  1:15 PM Leone Haven, MD [facility]  02/16/2018 11:00 AM O'Brien-Blaney, Denisa L, LPN [facility]  1/61/0960  9:40 AM Croitoru, Dani Gobble, MD [facility]    Primary Care Physician: Leone Haven, MD Location: [facility] Facility Note by:  Date: 12/12/2017; Time: 3:47 PM  Pain Score Disclaimer: We use the NRS-11 scale. This is a self-reported, subjective measurement of pain severity with only modest accuracy. It is used primarily to identify changes within a particular patient. It must be understood that outpatient pain scales are significantly less accurate that those used for research, where they can be applied under ideal controlled circumstances with minimal exposure to variables. In reality, the score is likely to be a  combination of pain intensity and pain affect, where pain affect describes the degree of emotional arousal or changes in action readiness caused by the sensory experience of pain. Factors such as social and work situation, setting, emotional state, anxiety levels, expectation, and prior pain experience may influence pain perception and show large inter-individual differences that may also be affected by time variables.  Patient instructions provided during this appointment: Patient Instructions    ____________________________________________________________________________________________  Appointment Policy Summary  It is our goal and responsibility to provide the medical community with assistance in the evaluation and management of patients with chronic pain. Unfortunately our resources are limited. Because we do not have an unlimited amount of time, or available appointments, we are required to closely monitor and manage their use. The following rules exist to maximize their use:  Patient's responsibilities: 1. Punctuality:  At what time should I arrive? You should be physically present in our office 30 minutes before your scheduled appointment. Your scheduled appointment is with your assigned healthcare provider. However, it takes 5-10 minutes to be "checked-in", and another 15 minutes for the nurses to do the admission. If you arrive to our office at the time you were given for your appointment, you will end up being at least 20-25 minutes late to your appointment with the provider. 2. Tardiness:  What happens if I arrive only a few minutes after my scheduled appointment time? You will need to reschedule your appointment. The cutoff is your appointment time. This is why it is so important that you arrive at least 30 minutes before that appointment. If you have an appointment scheduled for 10:00 AM and you arrive at 10:01, you will be required to reschedule your appointment.  3. Plan ahead:   Always assume that you will encounter traffic on your way in. Plan for it. If you are dependent on a driver, make sure they understand these rules and the need to arrive early. 4. Other appointments and responsibilities:  Avoid scheduling any other appointments before or after your pain clinic appointments.  5. Be prepared:  Write down everything that you need to discuss with your healthcare provider and give this information to the admitting nurse. Write down the medications that you will need refilled. Bring your pills and bottles (even the empty ones), to all of your appointments, except for those where a procedure is scheduled.  6. No children or pets:  Find someone to take care of them. It is not appropriate to bring them in. 7. Scheduling changes:  We request "advanced notification" of any changes or cancellations. 8. Advanced notification:  Defined as a time period of more than 24 hours prior to the originally scheduled appointment. This allows for the appointment to be offered to other patients. 9. Rescheduling:  When a visit is rescheduled, it will require the cancellation of the original appointment. For this reason they both fall within the category of "Cancellations".  10. Cancellations:  They require advanced notification. Any cancellation less than 24 hours before the  appointment will be recorded as a "No Show". 11. No Show:  Defined as an unkept appointment where the patient failed to notify or declare to the practice their intention or inability to keep the appointment.  Corrective process for repeat offenders:  1. Tardiness: Three (3) episodes of rescheduling due to late arrivals will be recorded as one (1) "No Show". 2. Cancellation or reschedule: Three (3) cancellations or rescheduling will be recorded as one (1) "No Show". 3. "No Shows": Three (3) "No Shows" within a 12 month period will result in discharge from the  practice.  ____________________________________________________________________________________________   ____________________________________________________________________________________________  Pain Scale  Introduction: The pain score used by this practice is the Verbal Numerical Rating Scale (VNRS-11). This is an 11-point scale. It is for adults and children 10 years or older. There are significant differences in how the pain score is reported, used, and applied. Forget everything you learned in the past and learn this scoring system.  General Information: The scale should reflect your current level of pain. Unless you are specifically asked for the level of your worst pain, or your average pain. If you are asked for one of these two, then it should be understood that it is over the past 24 hours.  Basic Activities of Daily Living (ADL): Personal hygiene, dressing, eating, transferring, and using restroom.  Instructions: Most patients tend to report their level of pain as a combination of two factors, their physical pain and their psychosocial pain. This last one is also known as "suffering" and it is reflection of how physical pain affects you socially and psychologically. From now on, report them separately. From this point on, when asked to report your pain level, report only your physical pain. Use the following table for reference.  Pain Clinic Pain Levels (0-5/10)  Pain Level Score  Description  No Pain 0   Mild pain 1 Nagging, annoying, but does not interfere with basic activities of daily living (ADL). Patients are able to eat, bathe, get dressed, toileting (being able to get on and off the toilet and perform personal hygiene functions), transfer (move in and out of bed or a chair without assistance), and maintain continence (able to control bladder and bowel functions). Blood pressure and heart rate are unaffected. A normal heart rate for a healthy adult ranges from 60 to 100 bpm  (beats per minute).   Mild to moderate pain 2 Noticeable and distracting. Impossible to hide from other people. More frequent flare-ups. Still possible to adapt and function close to normal. It can be very annoying and may have occasional stronger flare-ups. With discipline, patients may get used to it and adapt.   Moderate pain 3 Interferes significantly with activities of daily living (ADL). It becomes difficult to feed, bathe, get dressed, get on and off the toilet or to perform personal hygiene functions. Difficult to  get in and out of bed or a chair without assistance. Very distracting. With effort, it can be ignored when deeply involved in activities.   Moderately severe pain 4 Impossible to ignore for more than a few minutes. With effort, patients may still be able to manage work or participate in some social activities. Very difficult to concentrate. Signs of autonomic nervous system discharge are evident: dilated pupils (mydriasis); mild sweating (diaphoresis); sleep interference. Heart rate becomes elevated (>115 bpm). Diastolic blood pressure (lower number) rises above 100 mmHg. Patients find relief in laying down and not moving.   Severe pain 5 Intense and extremely unpleasant. Associated with frowning face and frequent crying. Pain overwhelms the senses.  Ability to do any activity or maintain social relationships becomes significantly limited. Conversation becomes difficult. Pacing back and forth is common, as getting into a comfortable position is nearly impossible. Pain wakes you up from deep sleep. Physical signs will be obvious: pupillary dilation; increased sweating; goosebumps; brisk reflexes; cold, clammy hands and feet; nausea, vomiting or dry heaves; loss of appetite; significant sleep disturbance with inability to fall asleep or to remain asleep. When persistent, significant weight loss is observed due to the complete loss of appetite and sleep deprivation.  Blood pressure and heart  rate becomes significantly elevated. Caution: If elevated blood pressure triggers a pounding headache associated with blurred vision, then the patient should immediately seek attention at an urgent or emergency care unit, as these may be signs of an impending stroke.    Emergency Department Pain Levels (6-10/10)  Emergency Room Pain 6 Severely limiting. Requires emergency care and should not be seen or managed at an outpatient pain management facility. Communication becomes difficult and requires great effort. Assistance to reach the emergency department may be required. Facial flushing and profuse sweating along with potentially dangerous increases in heart rate and blood pressure will be evident.   Distressing pain 7 Self-care is very difficult. Assistance is required to transport, or use restroom. Assistance to reach the emergency department will be required. Tasks requiring coordination, such as bathing and getting dressed become very difficult.   Disabling pain 8 Self-care is no longer possible. At this level, pain is disabling. The individual is unable to do even the most "basic" activities such as walking, eating, bathing, dressing, transferring to a bed, or toileting. Fine motor skills are lost. It is difficult to think clearly.   Incapacitating pain 9 Pain becomes incapacitating. Thought processing is no longer possible. Difficult to remember your own name. Control of movement and coordination are lost.   The worst pain imaginable 10 At this level, most patients pass out from pain. When this level is reached, collapse of the autonomic nervous system occurs, leading to a sudden drop in blood pressure and heart rate. This in turn results in a temporary and dramatic drop in blood flow to the brain, leading to a loss of consciousness. Fainting is one of the body's self defense mechanisms. Passing out puts the brain in a calmed state and causes it to shut down for a while, in order to begin the  healing process.    Summary: 1. Refer to this scale when providing [place] with your pain level. 2. Be accurate and careful when reporting your pain level. This will help with your care. 3. Over-reporting your pain level will lead to loss of credibility. 4. Even a level of 1/10 means that there is pain and will be treated at our facility. 5. High, inaccurate reporting will  be documented as "Symptom Exaggeration", leading to loss of credibility and suspicions of possible secondary gains such as obtaining more narcotics, or wanting to appear disabled, for fraudulent reasons. 6. Only pain levels of 5 or below will be seen at our facility. 7. Pain levels of 6 and above will be sent to the Emergency Department and the appointment cancelled. ____________________________________________________________________________________________

## 2017-12-13 DIAGNOSIS — M9901 Segmental and somatic dysfunction of cervical region: Secondary | ICD-10-CM | POA: Diagnosis not present

## 2017-12-13 DIAGNOSIS — M9903 Segmental and somatic dysfunction of lumbar region: Secondary | ICD-10-CM | POA: Diagnosis not present

## 2017-12-13 DIAGNOSIS — M5416 Radiculopathy, lumbar region: Secondary | ICD-10-CM | POA: Diagnosis not present

## 2017-12-13 DIAGNOSIS — M5033 Other cervical disc degeneration, cervicothoracic region: Secondary | ICD-10-CM | POA: Diagnosis not present

## 2017-12-15 ENCOUNTER — Other Ambulatory Visit: Payer: Self-pay | Admitting: Family Medicine

## 2017-12-15 DIAGNOSIS — M9903 Segmental and somatic dysfunction of lumbar region: Secondary | ICD-10-CM | POA: Diagnosis not present

## 2017-12-15 DIAGNOSIS — M5033 Other cervical disc degeneration, cervicothoracic region: Secondary | ICD-10-CM | POA: Diagnosis not present

## 2017-12-15 DIAGNOSIS — M9901 Segmental and somatic dysfunction of cervical region: Secondary | ICD-10-CM | POA: Diagnosis not present

## 2017-12-15 DIAGNOSIS — M5416 Radiculopathy, lumbar region: Secondary | ICD-10-CM | POA: Diagnosis not present

## 2017-12-15 LAB — COMPLIANCE DRUG ANALYSIS, UR

## 2017-12-17 LAB — COMP. METABOLIC PANEL (12)
A/G RATIO: 1.9 (ref 1.2–2.2)
ALBUMIN: 4.3 g/dL (ref 3.5–4.8)
ALK PHOS: 92 IU/L (ref 39–117)
AST: 20 IU/L (ref 0–40)
BILIRUBIN TOTAL: 0.4 mg/dL (ref 0.0–1.2)
BUN/Creatinine Ratio: 12 (ref 12–28)
BUN: 9 mg/dL (ref 8–27)
CHLORIDE: 100 mmol/L (ref 96–106)
Calcium: 9.5 mg/dL (ref 8.7–10.3)
Creatinine, Ser: 0.77 mg/dL (ref 0.57–1.00)
GFR calc Af Amer: 90 mL/min/{1.73_m2} (ref >59)
GFR, EST NON AFRICAN AMERICAN: 78 mL/min/{1.73_m2} (ref >59)
GLOBULIN, TOTAL: 2.3 g/dL (ref 1.5–4.5)
Glucose: 90 mg/dL (ref 65–99)
POTASSIUM: 4.7 mmol/L (ref 3.5–5.2)
SODIUM: 137 mmol/L (ref 134–144)
Total Protein: 6.6 g/dL (ref 6.0–8.5)

## 2017-12-17 LAB — 25-HYDROXY VITAMIN D LCMS D2+D3
25-Hydroxy, Vitamin D-2: <1 ng/mL
25-Hydroxy, Vitamin D-3: 49 ng/mL
25-Hydroxy, Vitamin D: 49 ng/mL

## 2017-12-17 LAB — VITAMIN B12: VITAMIN B 12: 334 pg/mL (ref 232–1245)

## 2017-12-17 LAB — SEDIMENTATION RATE: Sed Rate: 23 mm/hr (ref 0–40)

## 2017-12-17 LAB — MAGNESIUM: Magnesium: 2.1 mg/dL (ref 1.6–2.3)

## 2017-12-17 LAB — C-REACTIVE PROTEIN: CRP: 0.6 mg/L (ref 0.0–4.9)

## 2017-12-18 ENCOUNTER — Other Ambulatory Visit: Payer: Self-pay

## 2017-12-18 ENCOUNTER — Other Ambulatory Visit: Payer: Self-pay | Admitting: Family Medicine

## 2017-12-18 DIAGNOSIS — M9901 Segmental and somatic dysfunction of cervical region: Secondary | ICD-10-CM | POA: Diagnosis not present

## 2017-12-18 DIAGNOSIS — M9903 Segmental and somatic dysfunction of lumbar region: Secondary | ICD-10-CM | POA: Diagnosis not present

## 2017-12-18 DIAGNOSIS — M5033 Other cervical disc degeneration, cervicothoracic region: Secondary | ICD-10-CM | POA: Diagnosis not present

## 2017-12-18 DIAGNOSIS — M5416 Radiculopathy, lumbar region: Secondary | ICD-10-CM | POA: Diagnosis not present

## 2017-12-18 MED ORDER — CARISOPRODOL 350 MG PO TABS: ORAL_TABLET | ORAL | 0 refills | Status: DC

## 2017-12-18 NOTE — Telephone Encounter (Signed)
Last OV 11/06/17 last filled by Dr.Scott 10/25/17 60 [id number]

## 2017-12-18 NOTE — Telephone Encounter (Signed)
Okay to refill? Last written on: 10/25/17 #60 with no refills.  LOV: 11/06/17 NOV: 01/29/18

## 2017-12-19 DIAGNOSIS — F3341 Major depressive disorder, recurrent, in partial remission: Secondary | ICD-10-CM | POA: Diagnosis not present

## 2017-12-19 NOTE — Telephone Encounter (Signed)
faxed

## 2017-12-20 ENCOUNTER — Ambulatory Visit: Payer: Self-pay | Admitting: Podiatry

## 2017-12-20 ENCOUNTER — Other Ambulatory Visit: Payer: Self-pay | Admitting: Orthotics

## 2017-12-25 DIAGNOSIS — M9901 Segmental and somatic dysfunction of cervical region: Secondary | ICD-10-CM | POA: Diagnosis not present

## 2017-12-25 DIAGNOSIS — M5033 Other cervical disc degeneration, cervicothoracic region: Secondary | ICD-10-CM | POA: Diagnosis not present

## 2017-12-25 DIAGNOSIS — M5416 Radiculopathy, lumbar region: Secondary | ICD-10-CM | POA: Diagnosis not present

## 2017-12-25 DIAGNOSIS — M9903 Segmental and somatic dysfunction of lumbar region: Secondary | ICD-10-CM | POA: Diagnosis not present

## 2017-12-25 NOTE — Progress Notes (Signed)
Patient's Name: Jodi Beasley  MRN: [id number]  Referring Provider: Leone Haven, MD  DOB: 1946-02-05  PCP: Leone Haven, MD  DOS: 12/27/2017  Note by: Gaspar Cola, MD  Service setting: Ambulatory outpatient  Specialty: Interventional Pain Management  Location: [facility] (AMB) Pain Management Facility    Patient type: Established   Primary Reason(s) for Visit: Encounter for evaluation before starting new chronic pain management plan of care (Level of risk: moderate) CC: Back Pain (lower)  HPI  Jodi Beasley is a 72 y.o. year old, female patient, who comes today for a follow-up evaluation to review the test results and decide on a treatment plan. She has COPD (chronic obstructive pulmonary disease) with chronic bronchitis ([place]); Tobacco abuse; Coronary artery disease; Atrophic vaginitis; Hypertension; Allergic rhinitis; Hypothyroidism; Other and unspecified hyperlipidemia; Pharmacologic therapy; Adjustment disorder with mixed anxiety and depressed mood; De Quervain's tenosynovitis (Right); Osteopenia; Hypercholesterolemia; Epidermal cyst; Hand pain; Hyponatremia; Rotator cuff impingement syndrome (Right); Fall; Rash; Sinusitis; Chronic low back pain (Primary Area of Pain) (Bilateral) (L>R); Chronic lower extremity pain (Secondary Area of Pain) (Left); Long term current use of opiate analgesic; Chronic pain syndrome; Disorder of skeletal system; Other long term (current) drug therapy; Other specified health status; Problems influencing health status; Osteoarthritis of AC (acromioclavicular) joint (Right); Grade 1 Anterolisthesis of L4 on L5; DDD (degenerative disc disease), lumbar; Lumbar spondylosis; Lumbar central spinal stenosis (L4-5); Lumbar foraminal stenosis (L4-5) (Bilateral); Lumbar lateral recess stenosis (L4-5) (Bilateral); Osteoarthritis of lumbar facet joint (Bilateral); and Lumbar facet syndrome (Bilateral) (L>R) on their problem list. Her primarily concern today is the Back Pain  (lower)  Pain Assessment: Location: Lower, Left Back Radiating: down  side of leg to knee Onset: More than a month ago Duration: Chronic pain Quality: Aching("crimpling") Severity: 0-No pain/10 (self-reported pain score)  Note: Reported level is compatible with observation.                         When using our objective Pain Scale, levels between 6 and 10/10 are said to belong in an emergency room, as it progressively worsens from a 6/10, described as severely limiting, requiring emergency care not usually available at an outpatient pain management facility. At a 6/10 level, communication becomes difficult and requires great effort. Assistance to reach the emergency department may be required. Facial flushing and profuse sweating along with potentially dangerous increases in heart rate and blood pressure will be evident. Effect on ADL: ADL's when pt has back pain Timing:   Modifying factors: Chiropactor, medications  Jodi Beasley comes in today for a follow-up visit after her initial evaluation on 12/12/2017. Today we went over the results of her tests. These were explained in "Layman's terms". During today's appointment we went over my diagnostic impression, as well as the proposed treatment plan.  According to the patient her primary area of pain is in her lower back. She admits that she suffered a fall last January. She admits that the pain did not start until April. She admits that the low back pain is intermittent. She has been seen by chiropractor weekly for 3 week intervals. She admits that this is very effective for her pain management. She denies any previous surgery and is not interested in any surgery. She denies any previous interventional therapy or physical therapy. She has had a recent MRI.  Her second area of pain is in her lower extremities. She admits that is on the left side only. She states the  pain radiates down into her knee. She denies any numbness tingling or weakness. She  states that it is just pain only.  In considering the treatment plan options, Jodi Beasley was reminded that I no longer take patients for medication management only. I asked her to let me know if she had no intention of taking advantage of the interventional therapies, so that we could make arrangements to provide this space to someone interested. I also made it clear that undergoing interventional therapies for the purpose of getting pain medications is very inappropriate on the part of a patient, and it will not be tolerated in this practice. This type of behavior would suggest true addiction and therefore it requires referral to an addiction specialist.   Further details on both, my assessment(s), as well as the proposed treatment plan, please see below.  Controlled Substance Pharmacotherapy Assessment REMS (Risk Evaluation and Mitigation Strategy)  Analgesic: None Highest recorded MME/day: 67.5 mg/day MME/day: 0 mg/day Pill Count: None expected due to no prior prescriptions written by our practice. Jodi Specking, RN  12/27/2017 11:52 AM  Sign at close encounter Safety precautions to be maintained throughout the outpatient stay will include: orient to surroundings, keep bed in low position, maintain call bell within reach at all times, provide assistance with transfer out of bed and ambulation.    Pharmacokinetics: Liberation and absorption (onset of action): WNL Distribution (time to peak effect): WNL Metabolism and excretion (duration of action): WNL         Pharmacodynamics: Desired effects: Analgesia: [name] reports >50% benefit. Functional ability: Patient reports that medication allows her to accomplish basic ADLs Clinically meaningful improvement in function (CMIF): Sustained CMIF goals met Perceived effectiveness: Described as relatively effective, allowing for increase in activities of daily living (ADL) Undesirable effects: Side-effects or Adverse reactions: None  reported Monitoring: [place] PMP: Online review of the past 63-monthperiod previously conducted. Not applicable at this point since we have not taken over the patient's medication management yet. List of other Serum/Urine Drug Screening Test(s):  No results found for: AMPHSCRSER, BARBSCRSER, BENZOSCRSER, COCAINSCRSER, COCAINSCRNUR, [place] THCSCRSER, THCU, [place]omitaList of all UDS test(s) done:  Lab Results  Component Value Date   SUMMARY FINAL 12/12/2017   Last UDS on record: Summary  Date Value Ref Range Status  12/12/2017 FINAL  Final    Comment:    ==================================================================== TOXASSURE COMP DRUG ANALYSIS,UR ==================================================================== Specimen Alert Note:  Urinary creatinine is low; ability to detect some drugs may be compromised.  Interpret results with caution. ==================================================================== Test                             Result       Flag       Units Drug Present and Declared for Prescription Verification   Meprobamate                    PRESENT      EXPECTED    Source of meprobamate is most commonly as a metabolite of    carisoprodol, but it is also available as a prescription    medication. Source of carisoprodol is a scheduled prescription    medication.   Bupropion                      PRESENT      EXPECTED   Hydroxybupropion  PRESENT      EXPECTED    Hydroxybupropion is an expected metabolite of bupropion.   Citalopram                     PRESENT      EXPECTED   Desmethylcitalopram            PRESENT      EXPECTED    Desmethylcitalopram is an expected metabolite of citalopram or    the enantiomeric form, escitalopram.   Metoprolol                     PRESENT      EXPECTED Drug Absent but Declared for Prescription Verification   Salicylate                     Not Detected UNEXPECTED     Aspirin, as indicated in the declared medication list, is not    always detected even when used as directed. ==================================================================== Test                      Result    Flag   Units      Ref Range   Creatinine              17        L      mg/dL      >=20 ==================================================================== Declared Medications:  The flagging and interpretation on this report are based on the  following declared medications.  Unexpected results may arise from  inaccuracies in the declared medications.  **Note: The testing scope of this panel includes these medications:  Bupropion (Wellbutrin)  Carisoprodol (Soma)  Citalopram (Celexa)  Metoprolol (Lopressor)  **Note: The testing scope of this panel does not include small to  moderate amounts of these reported medications:  Aspirin (Aspirin 81)  **Note: The testing scope of this panel does not include following  reported medications:  Albuterol  Atorvastatin  Cholecalciferol  Fluticasone (Breo)  Levothyroxine  Multivitamin  Nitroglycerin (Nitrostat)  Omeprazole (Prilosec)  Ubiquinone (CoQ10)  Undefined Miscellaneous Drug  Vilanterol (Breo) ==================================================================== For clinical consultation, please call [phone]. ====================================================================    UDS interpretation: No unexpected findings.          Medication Assessment Form: Patient introduced to form today Treatment compliance: Treatment may start today if patient agrees with proposed plan. Evaluation of compliance is not applicable at this point Risk Assessment Profile: Aberrant behavior: See initial evaluations. None observed or detected today Comorbid factors increasing risk of overdose: See initial evaluation. No additional risks detected today Medical Psychology Evaluation: Please see scanned results in medical  record. Opioid Risk Tool - 12/27/17 1158      Family History of Substance Abuse   Alcohol  Negative    Illegal Drugs  Positive Female    Rx Drugs  Negative      Personal History of Substance Abuse   Alcohol  Negative    Illegal Drugs  Negative    Rx Drugs  Negative      Total Score   Opioid Risk Tool Scoring  2    Opioid Risk Interpretation  Low Risk      ORT Scoring interpretation table:  Score <3 = Low Risk for SUD  Score between 4-7 = Moderate Risk for SUD  Score >8 = High Risk for Opioid Abuse   Risk Mitigation Strategies:  Patient opioid safety counseling: Completed today. Counseling  provided to patient as per "Patient Counseling Document". Document signed by patient, attesting to counseling and understanding Patient-Prescriber Agreement (PPA): Obtained today.  Controlled substance notification to other providers: Written and sent today.  Pharmacologic Plan: Today we may be taking over the patient's pharmacological regimen. See below.             Laboratory Chemistry  Inflammation Markers (CRP: Acute Phase) (ESR: Chronic Phase) Lab Results  Component Value Date   CRP 0.6 12/12/2017   ESRSEDRATE 23 12/12/2017                 Renal Function Markers Lab Results  Component Value Date   BUN 9 12/12/2017   CREATININE 0.77 12/12/2017   GFRAA 90 12/12/2017   GFRNONAA 78 12/12/2017                 Hepatic Function Markers Lab Results  Component Value Date   AST 20 12/12/2017   ALT 17 04/10/2017   ALBUMIN 4.3 12/12/2017   ALKPHOS 92 12/12/2017   HCVAB NEGATIVE 01/27/2016                 Electrolytes Lab Results  Component Value Date   NA 137 12/12/2017   K 4.7 12/12/2017   CL 100 12/12/2017   CALCIUM 9.5 12/12/2017   MG 2.1 12/12/2017                 Neuropathy Markers Lab Results  Component Value Date   VITAMINB12 334 12/12/2017   HGBA1C 6.3 01/27/2016                 Bone Pathology Markers Lab Results  Component Value Date   25OHVITD1 49  12/12/2017   25OHVITD2 <1.0 12/12/2017   25OHVITD3 49 12/12/2017                 Coagulation Parameters Lab Results  Component Value Date   PLT 287.0 11/25/2014                 Cardiovascular Markers Lab Results  Component Value Date   CKTOTAL 127 07/26/2013   CKMB 2.3 07/26/2013   TROPONINI < 0.02 07/26/2013   HGB 14.2 11/25/2014   HCT 42.9 11/25/2014                 Note: Lab results reviewed.  Recent Diagnostic Imaging Review  Shoulder Imaging: Shoulder-R DG:  Results for orders placed in visit on 11/09/16  DG Shoulder Right   Narrative CLINICAL DATA:  Right shoulder pain. History of fall with a blow to the right shoulder 1.5 months ago. Initial encounter.  EXAM: RIGHT SHOULDER - 2+ VIEW  COMPARISON:  None.  FINDINGS: No acute bony or joint abnormality is identified. Mild acromioclavicular degenerative change is seen. 0.8 cm in diameter sclerotic lesion in the metaphysis of the humerus as appears most consistent with a benign enchondroma.  IMPRESSION: No acute abnormality.  Mild acromioclavicular osteoarthritis.   Electronically Signed   By: Inge Rise M.D.   On: 11/09/2016 15:27    Lumbosacral Imaging: Lumbar MR wo contrast:  Results for orders placed during the hospital encounter of 04/21/17  MR LUMBAR SPINE WO CONTRAST   Narrative CLINICAL DATA:  Low back pain  EXAM: MRI LUMBAR SPINE WITHOUT CONTRAST  TECHNIQUE: Multiplanar, multisequence MR imaging of the lumbar spine was performed. No intravenous contrast was administered.  COMPARISON:  Lumbar spine radiographs 03/25/2017  FINDINGS: Segmentation:  Normal  Alignment: 5 mm anterolisthesis L4-5.  Slight retrolisthesis L1-2 and L2-3  Vertebrae:  Negative for fracture or mass.  Normal bone marrow.  Conus medullaris: Extends to the L1-2 level and appears normal.  Paraspinal and other soft tissues: Left renal cyst 1 x 2 cm. No retroperitoneal mass or adenopathy  Disc  levels:  L1-2:  Disc bulging and mild facet degeneration without stenosis  L2-3: Mild disc degeneration and disc bulging. Mild facet degeneration without significant stenosis  L3-4: Mild disc bulging and moderate facet degeneration. No significant spinal stenosis.  L4-5: 5 mm anterolisthesis. Severe facet degeneration. Diffuse bulging of the disc. Moderate spinal stenosis. Moderate subarticular and foraminal stenosis bilaterally  L5-S1: Mild disc bulging. Bilateral facet degeneration. No significant stenosis.  IMPRESSION: 5 mm anterolisthesis L4-5. Moderate spinal stenosis. Moderate subarticular and foraminal stenosis bilaterally. Severe facet degeneration.  Mild disc and facet degeneration at L2-3, L3-4, L5-S1 without significant stenosis.   Electronically Signed   By: Franchot Gallo M.D.   On: 04/21/2017 13:18    Lumbar DG 2-3 views:  Results for orders placed during the hospital encounter of 03/25/17  DG Lumbar Spine 2-3 Views   Narrative CLINICAL DATA:  Chronic lower back pain, acutely worsened. Initial encounter.  EXAM: LUMBAR SPINE - 2-3 VIEW  COMPARISON:  None.  FINDINGS: There is no evidence of acute fracture or subluxation. There is grade 1 anterolisthesis of L4 on L5, with underlying facet disease. Vertebral bodies demonstrate normal height. Intervertebral disc spaces are preserved.  The visualized bowel gas pattern is unremarkable in appearance; air and stool are noted within the colon. The sacroiliac joints are within normal limits. Scattered calcification is seen along the abdominal aorta.  IMPRESSION: 1. No evidence of acute fracture or subluxation along the lumbar spine. 2. Grade 1 anterolisthesis of L4 on L5, with underlying facet disease. 3. Scattered aortic atherosclerosis.   Electronically Signed   By: Garald Balding M.D.   On: 03/25/2017 22:12    Complexity Note: Imaging results reviewed. Results shared with Ms. Graybill, using  Layman's terms.                         Meds   Current Outpatient Medications:  .  albuterol (PROAIR HFA) 108 (90 Base) MCG/ACT inhaler, Inhale 1-2 puffs into the lungs every 6 (six) hours as needed for wheezing or shortness of breath., Disp: 1 Inhaler, Rfl: 5 .  aspirin 81 MG tablet, Take 81 mg by mouth daily., Disp: , Rfl:  .  atorvastatin (LIPITOR) 80 MG tablet, Take 1 tablet (80 mg total) by mouth daily., Disp: 90 tablet, Rfl: 3 .  buPROPion (WELLBUTRIN XL) 150 MG 24 hr tablet, TAKE 1 TABLET(150 MG) BY MOUTH DAILY, Disp: 90 tablet, Rfl: 0 .  carisoprodol (SOMA) 350 MG tablet, TAKE 1 TABLET BY MOUTH TWICE DAILY AS NEEDED FOR MUSCLE SPASMS, Disp: 60 tablet, Rfl: 0 .  Cholecalciferol (VITAMIN D-3) 1000 UNITS CAPS, Take 1 capsule by mouth daily., Disp: , Rfl:  .  citalopram (CELEXA) 40 MG tablet, TAKE 1 TABLET(40 MG) BY MOUTH DAILY, Disp: 90 tablet, Rfl: 0 .  Coenzyme Q10 (COQ10) 400 MG CAPS, Take 400 mg by mouth daily., Disp: , Rfl:  .  fluticasone furoate-vilanterol (BREO ELLIPTA) 100-25 MCG/INH AEPB, Inhale 1 puff into the lungs daily., Disp: 2 each, Rfl: 0 .  levothyroxine (SYNTHROID, LEVOTHROID) 112 MCG tablet, TAKE 1 TABLET BY MOUTH EVERY DAY BEFORE BREAKFAST, Disp: 90 tablet, Rfl: 2 .  metoprolol tartrate (LOPRESSOR) 100 MG tablet,  TAKE 1 TABLET(100 MG) BY MOUTH TWICE DAILY, Disp: 180 tablet, Rfl: 0 .  Multiple Vitamin (MULTIVITAMIN) capsule, Take 1 capsule by mouth daily., Disp: , Rfl:  .  nitroGLYCERIN (NITROSTAT) 0.4 MG SL tablet, Place 1 tablet (0.4 mg total) under the tongue every 5 (five) minutes as needed., Disp: 25 tablet, Rfl: 3 .  omeprazole (PRILOSEC) 20 MG capsule, TAKE 1 CAPSULE BY MOUTH EVERY DAY, Disp: 90 capsule, Rfl: 0 .  UNABLE TO FIND, Med Name: Sinus and Allergy relief PE Per box directions as needed, Disp: , Rfl:  .  predniSONE (DELTASONE) 20 MG tablet, Take 3 tab(s) in the morning x 3 days, then 2 tab(s) x 3 days, followed by 1 tab x 3 days., Disp: 21 tablet, Rfl:  1  ROS  Constitutional: Denies any fever or chills Gastrointestinal: No reported hemesis, hematochezia, vomiting, or acute GI distress Musculoskeletal: Denies any acute onset joint swelling, redness, loss of ROM, or weakness Neurological: No reported episodes of acute onset apraxia, aphasia, dysarthria, agnosia, amnesia, paralysis, loss of coordination, or loss of consciousness  Allergies  Ms. Wadleigh is allergic to benzocaine; darvon [propoxyphene hcl]; monosodium glutamate; neosporin [neomycin-bacitracin zn-polymyx]; nicoderm [nicotine]; and prednisone.  [place]  Drug: Ms. Garretson  reports that she does not use drugs. Alcohol:  reports that she drinks about 0.6 oz of alcohol per week. Tobacco:  reports that she has been smoking e-cigarettes and cigarettes.  She started smoking about 57 years ago. She has a 53.00 pack-year smoking history. she has never used smokeless tobacco. Medical:  has a past medical history of Arrhythmia, Chicken pox, COPD (chronic obstructive pulmonary disease) ([place]), Coronary artery disease, Depression, GERD (gastroesophageal reflux disease), Hay fever, History of blood transfusion, History of hypercholesterolemia, History of syncope (2000), Hyperlipidemia, Hypertension, Hypothyroid, MI (myocardial infarction) ([place]), and Pneumonia, organism unspecified(486). Surgical: Ms. Fini  has a past surgical history that includes Tubal ligation; Tonsillectomy; and Coronary angioplasty with stent (2009). Family: family history includes Cancer in her father; Colon cancer in her father; Diabetes in her unknown relative; Hypertension in her mother and unknown relative; Lung cancer in her father.  Constitutional Exam  General appearance: Well nourished, well developed, and well hydrated. In no apparent acute distress Vitals:   12/27/17 1151  BP: 129/75  Pulse: 62  Resp: 16  Temp: (!) 97 F (36.1 C)  SpO2: 97%  Weight: 155 lb (70.3 kg)  Height: '5\' 5"'  (1.651 m)   BMI  Assessment: Estimated body mass index is 25.79 kg/m as calculated from the following:   Height as of this encounter: '5\' 5"'  (1.651 m).   Weight as of this encounter: 155 lb (70.3 kg).  BMI interpretation table: BMI level Category Range association with higher incidence of chronic pain  <18 kg/m2 Underweight   18.5-24.9 kg/m2 Ideal body weight   25-29.9 kg/m2 Overweight Increased incidence by 20%  30-34.9 kg/m2 Obese (Class I) Increased incidence by 68%  35-39.9 kg/m2 Severe obesity (Class II) Increased incidence by 136%  >40 kg/m2 Extreme obesity (Class III) Increased incidence by 254%   BMI Readings from Last 4 Encounters:  12/27/17 25.79 kg/m  12/12/17 26.13 kg/m  11/17/17 26.38 kg/m  11/06/17 26.73 kg/m   Wt Readings from Last 4 Encounters:  12/27/17 155 lb (70.3 kg)  12/12/17 157 lb (71.2 kg)  11/17/17 158 lb 8 oz (71.9 kg)  11/06/17 160 lb 9.6 oz (72.8 kg)  Psych/Mental status: Alert, oriented x 3 (person, place, & time)  Eyes: PERLA Respiratory: No evidence of acute respiratory distress  Cervical Spine Area Exam  Skin & Axial Inspection: No masses, redness, edema, swelling, or associated skin lesions Alignment: Symmetrical Functional ROM: Unrestricted ROM      Stability: No instability detected Muscle Tone/Strength: Functionally intact. No obvious neuro-muscular anomalies detected. Sensory (Neurological): Unimpaired Palpation: No palpable anomalies              Upper Extremity (UE) Exam    Side: Right upper extremity  Side: Left upper extremity  Skin & Extremity Inspection: Skin color, temperature, and hair growth are WNL. No peripheral edema or cyanosis. No masses, redness, swelling, asymmetry, or associated skin lesions. No contractures.  Skin & Extremity Inspection: Skin color, temperature, and hair growth are WNL. No peripheral edema or cyanosis. No masses, redness, swelling, asymmetry, or associated skin lesions. No contractures.  Functional ROM:  Unrestricted ROM          Functional ROM: Unrestricted ROM          Muscle Tone/Strength: Functionally intact. No obvious neuro-muscular anomalies detected.  Muscle Tone/Strength: Functionally intact. No obvious neuro-muscular anomalies detected.  Sensory (Neurological): Unimpaired          Sensory (Neurological): Unimpaired          Palpation: No palpable anomalies              Palpation: No palpable anomalies              Specialized Test(s): Deferred         Specialized Test(s): Deferred          Thoracic Spine Area Exam  Skin & Axial Inspection: No masses, redness, or swelling Alignment: Symmetrical Functional ROM: Unrestricted ROM Stability: No instability detected Muscle Tone/Strength: Functionally intact. No obvious neuro-muscular anomalies detected. Sensory (Neurological): Unimpaired Muscle strength & Tone: No palpable anomalies  Lumbar Spine Area Exam  Skin & Axial Inspection: No masses, redness, or swelling Alignment: Symmetrical Functional ROM: Unrestricted ROM      Stability: No instability detected Muscle Tone/Strength: Functionally intact. No obvious neuro-muscular anomalies detected. Sensory (Neurological): Unimpaired Palpation: No palpable anomalies       Provocative Tests: Lumbar Hyperextension and rotation test: evaluation deferred today       Lumbar Lateral bending test: evaluation deferred today       Patrick's Maneuver: evaluation deferred today                    Gait & Posture Assessment  Ambulation: Unassisted Gait: Relatively normal for age and body habitus Posture: WNL   Lower Extremity Exam    Side: Right lower extremity  Side: Left lower extremity  Skin & Extremity Inspection: Skin color, temperature, and hair growth are WNL. No peripheral edema or cyanosis. No masses, redness, swelling, asymmetry, or associated skin lesions. No contractures.  Skin & Extremity Inspection: Skin color, temperature, and hair growth are WNL. No peripheral edema or cyanosis.  No masses, redness, swelling, asymmetry, or associated skin lesions. No contractures.  Functional ROM: Unrestricted ROM          Functional ROM: Unrestricted ROM          Muscle Tone/Strength: Functionally intact. No obvious neuro-muscular anomalies detected.  Muscle Tone/Strength: Functionally intact. No obvious neuro-muscular anomalies detected.  Sensory (Neurological): Unimpaired  Sensory (Neurological): Unimpaired  Palpation: No palpable anomalies  Palpation: No palpable anomalies   Assessment & Plan  Primary Diagnosis & Pertinent Problem List: The primary encounter diagnosis was Chronic pain syndrome.  Diagnoses of Chronic low back pain (Primary Area of Pain) (Bilateral) (L>R), DDD (degenerative disc disease), lumbar, Grade 1 Anterolisthesis of L4 on L5, Osteoarthritis of lumbar facet joint (Bilateral), Lumbar facet syndrome (Bilateral) (L>R), Chronic lower extremity pain (Secondary Area of Pain) (Left), Lumbar spondylosis, Lumbar central spinal stenosis (L4-5), Lumbar foraminal stenosis (L4-5) (Bilateral), Lumbar lateral recess stenosis (L4-5) (Bilateral), Disorder of skeletal system, Pharmacologic therapy, and Problems influencing health status were also pertinent to this visit.  Visit Diagnosis: 1. Chronic pain syndrome   2. Chronic low back pain (Primary Area of Pain) (Bilateral) (L>R)   3. DDD (degenerative disc disease), lumbar   4. Grade 1 Anterolisthesis of L4 on L5   5. Osteoarthritis of lumbar facet joint (Bilateral)   6. Lumbar facet syndrome (Bilateral) (L>R)   7. Chronic lower extremity pain (Secondary Area of Pain) (Left)   8. Lumbar spondylosis   9. Lumbar central spinal stenosis (L4-5)   10. Lumbar foraminal stenosis (L4-5) (Bilateral)   11. Lumbar lateral recess stenosis (L4-5) (Bilateral)   12. Disorder of skeletal system   13. Pharmacologic therapy   14. Problems influencing health status    Problems updated and reviewed during this visit: Problem  Osteoarthritis of  AC (acromioclavicular) joint (Right)  Grade 1 Anterolisthesis of L4 on L5  Ddd (Degenerative Disc Disease), Lumbar  Lumbar spondylosis  Lumbar central spinal stenosis (L4-5)  Lumbar foraminal stenosis (L4-5) (Bilateral)  Lumbar lateral recess stenosis (L4-5) (Bilateral)   Secondary to lumbar facet hypertrophy and Lumbar anterolisthesis   Osteoarthritis of lumbar facet joint (Bilateral)  Lumbar facet syndrome (Bilateral) (L>R)  Chronic low back pain (Primary Area of Pain) (Bilateral) (L>R)  Chronic lower extremity pain (Secondary Area of Pain) (Left)  Chronic Pain Syndrome  Rotator cuff impingement syndrome (Right)  Hand Pain  De Quervain's tenosynovitis (Right)  Problems Influencing Health Status  Long Term Current Use of Opiate Analgesic  Disorder of Skeletal System  Other Long Term (Current) Drug Therapy  Other Specified Health Status  Pharmacologic Therapy  COPD (chronic obstructive pulmonary disease) with chronic bronchitis ([place])   04/09/2012 Simple Spirometry: Ratio 71%, FEV1 1.92L (77% pred); flow volume loop consistent with obstruction   Tobacco Abuse  Sinusitis  Rash  Fall  Epidermal Cyst  Hyponatremia  Hypercholesterolemia  Osteopenia  Adjustment Disorder With Mixed Anxiety and Depressed Mood  Hypothyroidism  Other and Unspecified Hyperlipidemia  Allergic Rhinitis  Hypertension  Coronary Artery Disease   S/p drug eluting stent 2009   Atrophic Vaginitis    Plan of Care  Pharmacotherapy (Medications Ordered): Meds ordered this encounter  Medications  . predniSONE (DELTASONE) 20 MG tablet    Sig: Take 3 tab(s) in the morning x 3 days, then 2 tab(s) x 3 days, followed by 1 tab x 3 days.    Dispense:  21 tablet    Refill:  1    Do not add to the "Automatic Refill" notification system.   Procedure Orders    No procedure(s) ordered today   Lab Orders  No laboratory test(s) ordered today   Imaging Orders  No imaging studies ordered today   Referral  Orders  No referral(s) requested today    Pharmacological management options:  Opioid Analgesics: I will not be prescribing any opioids at this time Membrane stabilizer: None prescribed at this time Muscle relaxant: None prescribed at this time NSAID: None prescribed at this time Other analgesic(s): Steroid taper prescribed to the patient for emergencies.   Interventional management options: Planned, scheduled, and/or  pending:    None at this time.   Considering:   Diagnostic bilateral lumbar facet block  Possible bilateral lumbar facet RFA  Diagnostic left-sided L5-S1 versus L4-5 interlaminar lumbar epidural steroid injection  Diagnostic bilateral L4-5 transforaminal epidural steroid injection    PRN Procedures:   None at this time   Provider-requested follow-up: Return if symptoms worsen or fail to improve.  Future Appointments  Date Time Provider [place]  12/29/2017 11:30 AM Juanito Doom, MD [facility] None  02/09/2018  2:15 PM Leone Haven, MD [facility]  02/16/2018 11:00 AM O'Brien-Blaney, Denisa L, LPN [facility]  0/93/[id number]  9:40 AM Croitoru, Dani Gobble, MD [facility]    Primary Care Physician: Leone Haven, MD Location: [facility] Note by: Gaspar Cola, MD Date: 12/27/2017; Time: 3:57 PM

## 2017-12-27 ENCOUNTER — Encounter: Payer: Self-pay | Admitting: Pain Medicine

## 2017-12-27 ENCOUNTER — Other Ambulatory Visit: Payer: Self-pay

## 2017-12-27 ENCOUNTER — Ambulatory Visit: Payer: Medicare Other | Attending: Pain Medicine | Admitting: Pain Medicine

## 2017-12-27 VITALS — BP 129/75 | HR 62 | Temp 97.0°F | Resp 16 | Ht 65.0 in | Wt 155.0 lb

## 2017-12-27 DIAGNOSIS — M4310 Spondylolisthesis, site unspecified: Secondary | ICD-10-CM

## 2017-12-27 DIAGNOSIS — M9983 Other biomechanical lesions of lumbar region: Secondary | ICD-10-CM

## 2017-12-27 DIAGNOSIS — M899 Disorder of bone, unspecified: Secondary | ICD-10-CM | POA: Diagnosis not present

## 2017-12-27 DIAGNOSIS — I252 Old myocardial infarction: Secondary | ICD-10-CM | POA: Diagnosis not present

## 2017-12-27 DIAGNOSIS — M79605 Pain in left leg: Secondary | ICD-10-CM | POA: Diagnosis not present

## 2017-12-27 DIAGNOSIS — M4802 Spinal stenosis, cervical region: Secondary | ICD-10-CM | POA: Insufficient documentation

## 2017-12-27 DIAGNOSIS — K219 Gastro-esophageal reflux disease without esophagitis: Secondary | ICD-10-CM | POA: Insufficient documentation

## 2017-12-27 DIAGNOSIS — I10 Essential (primary) hypertension: Secondary | ICD-10-CM | POA: Diagnosis not present

## 2017-12-27 DIAGNOSIS — Z79899 Other long term (current) drug therapy: Secondary | ICD-10-CM | POA: Diagnosis not present

## 2017-12-27 DIAGNOSIS — G8929 Other chronic pain: Secondary | ICD-10-CM

## 2017-12-27 DIAGNOSIS — M4726 Other spondylosis with radiculopathy, lumbar region: Secondary | ICD-10-CM | POA: Diagnosis not present

## 2017-12-27 DIAGNOSIS — M8580 Other specified disorders of bone density and structure, unspecified site: Secondary | ICD-10-CM | POA: Diagnosis not present

## 2017-12-27 DIAGNOSIS — G894 Chronic pain syndrome: Secondary | ICD-10-CM | POA: Diagnosis not present

## 2017-12-27 DIAGNOSIS — E7800 Pure hypercholesterolemia, unspecified: Secondary | ICD-10-CM | POA: Insufficient documentation

## 2017-12-27 DIAGNOSIS — Z7982 Long term (current) use of aspirin: Secondary | ICD-10-CM | POA: Insufficient documentation

## 2017-12-27 DIAGNOSIS — M5136 Other intervertebral disc degeneration, lumbar region: Secondary | ICD-10-CM

## 2017-12-27 DIAGNOSIS — I2510 Atherosclerotic heart disease of native coronary artery without angina pectoris: Secondary | ICD-10-CM | POA: Insufficient documentation

## 2017-12-27 DIAGNOSIS — M5441 Lumbago with sciatica, right side: Secondary | ICD-10-CM

## 2017-12-27 DIAGNOSIS — M4316 Spondylolisthesis, lumbar region: Secondary | ICD-10-CM | POA: Insufficient documentation

## 2017-12-27 DIAGNOSIS — F4323 Adjustment disorder with mixed anxiety and depressed mood: Secondary | ICD-10-CM | POA: Insufficient documentation

## 2017-12-27 DIAGNOSIS — M5442 Lumbago with sciatica, left side: Secondary | ICD-10-CM | POA: Diagnosis not present

## 2017-12-27 DIAGNOSIS — M19011 Primary osteoarthritis, right shoulder: Secondary | ICD-10-CM | POA: Insufficient documentation

## 2017-12-27 DIAGNOSIS — F17210 Nicotine dependence, cigarettes, uncomplicated: Secondary | ICD-10-CM | POA: Diagnosis not present

## 2017-12-27 DIAGNOSIS — Z789 Other specified health status: Secondary | ICD-10-CM

## 2017-12-27 DIAGNOSIS — M48062 Spinal stenosis, lumbar region with neurogenic claudication: Secondary | ICD-10-CM

## 2017-12-27 DIAGNOSIS — M47816 Spondylosis without myelopathy or radiculopathy, lumbar region: Secondary | ICD-10-CM

## 2017-12-27 DIAGNOSIS — I700 Atherosclerosis of aorta: Secondary | ICD-10-CM | POA: Insufficient documentation

## 2017-12-27 DIAGNOSIS — Z79891 Long term (current) use of opiate analgesic: Secondary | ICD-10-CM | POA: Insufficient documentation

## 2017-12-27 DIAGNOSIS — E785 Hyperlipidemia, unspecified: Secondary | ICD-10-CM | POA: Insufficient documentation

## 2017-12-27 DIAGNOSIS — J42 Unspecified chronic bronchitis: Secondary | ICD-10-CM | POA: Diagnosis not present

## 2017-12-27 DIAGNOSIS — J309 Allergic rhinitis, unspecified: Secondary | ICD-10-CM | POA: Diagnosis not present

## 2017-12-27 DIAGNOSIS — M545 Low back pain: Secondary | ICD-10-CM | POA: Diagnosis not present

## 2017-12-27 DIAGNOSIS — M51369 Other intervertebral disc degeneration, lumbar region without mention of lumbar back pain or lower extremity pain: Secondary | ICD-10-CM | POA: Insufficient documentation

## 2017-12-27 DIAGNOSIS — M654 Radial styloid tenosynovitis [de Quervain]: Secondary | ICD-10-CM | POA: Insufficient documentation

## 2017-12-27 DIAGNOSIS — Z955 Presence of coronary angioplasty implant and graft: Secondary | ICD-10-CM | POA: Insufficient documentation

## 2017-12-27 DIAGNOSIS — M48061 Spinal stenosis, lumbar region without neurogenic claudication: Secondary | ICD-10-CM | POA: Diagnosis not present

## 2017-12-27 DIAGNOSIS — E039 Hypothyroidism, unspecified: Secondary | ICD-10-CM | POA: Insufficient documentation

## 2017-12-27 MED ORDER — PREDNISONE 20 MG PO TABS: ORAL_TABLET | ORAL | 1 refills | Status: DC

## 2017-12-27 NOTE — Progress Notes (Signed)
Safety precautions to be maintained throughout the outpatient stay will include: orient to surroundings, keep bed in low position, maintain call bell within reach at all times, provide assistance with transfer out of bed and ambulation.  

## 2017-12-27 NOTE — Patient Instructions (Signed)

## 2017-12-29 ENCOUNTER — Encounter: Payer: Self-pay | Admitting: Pulmonary Disease

## 2017-12-29 ENCOUNTER — Ambulatory Visit (INDEPENDENT_AMBULATORY_CARE_PROVIDER_SITE_OTHER): Payer: Medicare Other | Admitting: Pulmonary Disease

## 2017-12-29 VITALS — BP 126/68 | HR 62 | Ht 65.0 in | Wt 157.0 lb

## 2017-12-29 DIAGNOSIS — J432 Centrilobular emphysema: Secondary | ICD-10-CM

## 2017-12-29 DIAGNOSIS — F17200 Nicotine dependence, unspecified, uncomplicated: Secondary | ICD-10-CM | POA: Diagnosis not present

## 2017-12-29 NOTE — Patient Instructions (Signed)
COPD: I'm glad your pneumonia and flu shots are up to date Continue Breo Exercise regularly, as much as possible Use albuterol as needed for shortness of breath  Tobacco abuse: Quit smoking right away  We will see you back in 6 months or sooner if needed

## 2017-12-29 NOTE — Progress Notes (Signed)
Subjective:    Patient ID: Jodi Beasley, female    DOB: [birth year], 72 y.o.   MRN: [id number]  Synopsis: Jodi Beasley established care with the [facility] pulmonary clinic in May 2013 for COPD. She simple spirometry at that time with a ratio of 71%, the flow volume loop consistent with obstruction and an FEV1 of 1.92 L or 77% predicted. She was followed previously by Dr. Efraim Kaufmann, a pulmonologist in the [place], [place] area. She had been treated for pneumonia 3 weeks prior to her initial visit.  She has smoked one half pack of cigarettes per day for 53 years and continues to smoke.  HPI Chief Complaint  Patient presents with  . Follow-up    pt doing well, denies any current complaints.     [name] has been OK.  She stopped the symbicort and she feels better just taking Breo alone.  She is not taking spiriva.  She has not been able to exercise due to severe back pain.  She says that she fell last January and has been having significant pain since then.  She has been working with pain management and a chiropractor closely to help her pain.    Past Medical History:  Diagnosis Date  . Arrhythmia   . Chicken pox   . COPD (chronic obstructive pulmonary disease) ([place])   . Coronary artery disease    NSTEMI in 09/2008. Cath : 80% RCA and 95% first diagonal. PCI and 2 DES placement  RCA and 1 DES to diagonal. Complicated by acute diagonal stent thrombosis . Treated by thrombectomy. Most recent nuclear stress test in 2010 showed no ischemia with normal EF.   Marland Kitchen Depression   . GERD (gastroesophageal reflux disease)   . Hay fever   . History of blood transfusion   . History of hypercholesterolemia   . History of syncope 2000   negative EP study  . Hyperlipidemia    intolerance to statins except Crestor.   . Hypertension   . Hypothyroid   . MI (myocardial infarction) ([place])   . Pneumonia, organism unspecified(486)     Review of Systems  Constitutional: Negative for fatigue, fever  and unexpected weight change.  HENT: Negative for congestion, postnasal drip, rhinorrhea and sinus pressure.   Respiratory: Negative for cough, choking and shortness of breath.   Cardiovascular: Negative for chest pain and leg swelling.       Objective:   Physical Exam Vitals:   12/29/17 1124  BP: 126/68  Pulse: 62  SpO2: 95%  Weight: 157 lb (71.2 kg)  Height: 5\' 5"  (1.651 m)   room air  Gen: well appearing HENT: OP clear, TM's clear, neck supple PULM: CTA B, normal percussion CV: RRR, no mgr, trace edema GI: BS+, soft, nontender Derm: no cyanosis or rash Psyche: normal mood and affect   PFT: 04/09/2012 Simple Spirometry: Ratio 71%, FEV1 1.92L (77% pred); flow volume loop consistent with obstruction 03/2016 PFT> FEV1 1.73 L (74%, > 30% change with bronchodilator)  Records from her PCP reviewed from May were she was seen for back pain. Physical therapy as recommended per     Assessment & Plan:   No diagnosis found.  Discussion: This has been a stable interval for [name] in terms of her breathing.  I would really like for her to exercise more but she is limited by her musculoskeletal pain right now.   COPD: I'm glad your pneumonia and flu shots are up to date Continue Breo Exercise regularly, as much  as possible Use albuterol as needed for shortness of breath  Tobacco abuse: Quit smoking right away She refuses Lung Cancer screening CT scans  We will see you back in 6 months or sooner if needed  Updated Medication List Outpatient Encounter Medications as of 12/29/2017  Medication Sig  . albuterol (PROAIR HFA) 108 (90 Base) MCG/ACT inhaler Inhale 1-2 puffs into the lungs every 6 (six) hours as needed for wheezing or shortness of breath.  Marland Kitchen aspirin 81 MG tablet Take 81 mg by mouth daily.  Marland Kitchen atorvastatin (LIPITOR) 80 MG tablet Take 1 tablet (80 mg total) by mouth daily.  Marland Kitchen buPROPion (WELLBUTRIN XL) 150 MG 24 hr tablet TAKE 1 TABLET(150 MG) BY MOUTH DAILY  .  carisoprodol (SOMA) 350 MG tablet TAKE 1 TABLET BY MOUTH TWICE DAILY AS NEEDED FOR MUSCLE SPASMS  . Cholecalciferol (VITAMIN D-3) 1000 UNITS CAPS Take 1 capsule by mouth daily.  . citalopram (CELEXA) 40 MG tablet TAKE 1 TABLET(40 MG) BY MOUTH DAILY  . Coenzyme Q10 (COQ10) 400 MG CAPS Take 400 mg by mouth daily.  . fluticasone furoate-vilanterol (BREO ELLIPTA) 100-25 MCG/INH AEPB Inhale 1 puff into the lungs daily.  Marland Kitchen levothyroxine (SYNTHROID, LEVOTHROID) 112 MCG tablet TAKE 1 TABLET BY MOUTH EVERY DAY BEFORE BREAKFAST  . metoprolol tartrate (LOPRESSOR) 100 MG tablet TAKE 1 TABLET(100 MG) BY MOUTH TWICE DAILY  . Multiple Vitamin (MULTIVITAMIN) capsule Take 1 capsule by mouth daily.  . nitroGLYCERIN (NITROSTAT) 0.4 MG SL tablet Place 1 tablet (0.4 mg total) under the tongue every 5 (five) minutes as needed.  Marland Kitchen omeprazole (PRILOSEC) 20 MG capsule TAKE 1 CAPSULE BY MOUTH EVERY DAY  . vitamin B-12 (CYANOCOBALAMIN) 100 MCG tablet Take 100 mcg by mouth daily.  . [DISCONTINUED] UNABLE TO FIND Med Name: Sinus and Allergy relief PE Per box directions as needed  . [DISCONTINUED] predniSONE (DELTASONE) 20 MG tablet Take 3 tab(s) in the morning x 3 days, then 2 tab(s) x 3 days, followed by 1 tab x 3 days. (Patient not taking: Reported on 12/29/2017)   No facility-administered encounter medications on file as of 12/29/2017.

## 2018-01-02 DIAGNOSIS — M5033 Other cervical disc degeneration, cervicothoracic region: Secondary | ICD-10-CM | POA: Diagnosis not present

## 2018-01-02 DIAGNOSIS — M5416 Radiculopathy, lumbar region: Secondary | ICD-10-CM | POA: Diagnosis not present

## 2018-01-02 DIAGNOSIS — M9903 Segmental and somatic dysfunction of lumbar region: Secondary | ICD-10-CM | POA: Diagnosis not present

## 2018-01-02 DIAGNOSIS — M9901 Segmental and somatic dysfunction of cervical region: Secondary | ICD-10-CM | POA: Diagnosis not present

## 2018-01-03 ENCOUNTER — Telehealth: Payer: Self-pay | Admitting: Cardiovascular Disease

## 2018-01-03 NOTE — Telephone Encounter (Signed)
Ibuprofen 800 mg is not absolutely contraindicated, but there are two Cardiology concerns:  - BP is likely to increase. Please be particularly attentive to sodium dietary restriction and call us if BP>140/90  - there is a small, but real increase in the risk of acute MI with long term use of any NSAID (ibuprofen or other). Try to limit the duration of high dose daily use of ibuprofen. The most likely problem to occur is not heart-related, but stomach upset or bleeding. Continue omeprazole MCr

## 2018-01-03 NOTE — Telephone Encounter (Signed)
New message  Pt verbalized that she is calling for the RN  Can pt take 4 motrin at one time for back pain

## 2018-01-03 NOTE — Telephone Encounter (Signed)
Spoke with pt, she is wanting to know if taking the 4 tablets of motrin for 6 weeks is okay. She is aware it will need to be taken with food. Will forward to dr croitoru for review and advise.

## 2018-01-03 NOTE — Telephone Encounter (Signed)
Spoke with pt, aware of dr croitoru's recommendations.  

## 2018-01-04 ENCOUNTER — Other Ambulatory Visit: Payer: Self-pay | Admitting: Family Medicine

## 2018-01-09 DIAGNOSIS — M5416 Radiculopathy, lumbar region: Secondary | ICD-10-CM | POA: Diagnosis not present

## 2018-01-09 DIAGNOSIS — M9901 Segmental and somatic dysfunction of cervical region: Secondary | ICD-10-CM | POA: Diagnosis not present

## 2018-01-09 DIAGNOSIS — M9903 Segmental and somatic dysfunction of lumbar region: Secondary | ICD-10-CM | POA: Diagnosis not present

## 2018-01-09 DIAGNOSIS — M5033 Other cervical disc degeneration, cervicothoracic region: Secondary | ICD-10-CM | POA: Diagnosis not present

## 2018-01-19 DIAGNOSIS — M9903 Segmental and somatic dysfunction of lumbar region: Secondary | ICD-10-CM | POA: Diagnosis not present

## 2018-01-19 DIAGNOSIS — M5033 Other cervical disc degeneration, cervicothoracic region: Secondary | ICD-10-CM | POA: Diagnosis not present

## 2018-01-19 DIAGNOSIS — M9901 Segmental and somatic dysfunction of cervical region: Secondary | ICD-10-CM | POA: Diagnosis not present

## 2018-01-19 DIAGNOSIS — M5416 Radiculopathy, lumbar region: Secondary | ICD-10-CM | POA: Diagnosis not present

## 2018-01-22 DIAGNOSIS — H2513 Age-related nuclear cataract, bilateral: Secondary | ICD-10-CM | POA: Diagnosis not present

## 2018-01-23 DIAGNOSIS — M9903 Segmental and somatic dysfunction of lumbar region: Secondary | ICD-10-CM | POA: Diagnosis not present

## 2018-01-23 DIAGNOSIS — M5416 Radiculopathy, lumbar region: Secondary | ICD-10-CM | POA: Diagnosis not present

## 2018-01-23 DIAGNOSIS — M9901 Segmental and somatic dysfunction of cervical region: Secondary | ICD-10-CM | POA: Diagnosis not present

## 2018-01-23 DIAGNOSIS — M5033 Other cervical disc degeneration, cervicothoracic region: Secondary | ICD-10-CM | POA: Diagnosis not present

## 2018-01-26 ENCOUNTER — Ambulatory Visit: Payer: Self-pay | Admitting: Cardiovascular Disease

## 2018-01-29 ENCOUNTER — Ambulatory Visit: Payer: Self-pay | Admitting: Family Medicine

## 2018-02-01 DIAGNOSIS — M9903 Segmental and somatic dysfunction of lumbar region: Secondary | ICD-10-CM | POA: Diagnosis not present

## 2018-02-01 DIAGNOSIS — M9901 Segmental and somatic dysfunction of cervical region: Secondary | ICD-10-CM | POA: Diagnosis not present

## 2018-02-01 DIAGNOSIS — M5416 Radiculopathy, lumbar region: Secondary | ICD-10-CM | POA: Diagnosis not present

## 2018-02-01 DIAGNOSIS — M5033 Other cervical disc degeneration, cervicothoracic region: Secondary | ICD-10-CM | POA: Diagnosis not present

## 2018-02-02 ENCOUNTER — Other Ambulatory Visit: Payer: Self-pay | Admitting: Family Medicine

## 2018-02-02 NOTE — Telephone Encounter (Signed)
Last OV 11/06/17 last filled 12/18/17 60 0rf

## 2018-02-06 ENCOUNTER — Other Ambulatory Visit: Payer: Self-pay | Admitting: Family Medicine

## 2018-02-07 ENCOUNTER — Other Ambulatory Visit: Payer: Self-pay | Admitting: Pulmonary Disease

## 2018-02-09 ENCOUNTER — Ambulatory Visit: Payer: Self-pay | Admitting: Family Medicine

## 2018-02-15 DIAGNOSIS — M5033 Other cervical disc degeneration, cervicothoracic region: Secondary | ICD-10-CM | POA: Diagnosis not present

## 2018-02-15 DIAGNOSIS — M5416 Radiculopathy, lumbar region: Secondary | ICD-10-CM | POA: Diagnosis not present

## 2018-02-15 DIAGNOSIS — M9903 Segmental and somatic dysfunction of lumbar region: Secondary | ICD-10-CM | POA: Diagnosis not present

## 2018-02-15 DIAGNOSIS — M9901 Segmental and somatic dysfunction of cervical region: Secondary | ICD-10-CM | POA: Diagnosis not present

## 2018-02-16 ENCOUNTER — Ambulatory Visit (INDEPENDENT_AMBULATORY_CARE_PROVIDER_SITE_OTHER): Payer: Medicare Other

## 2018-02-16 VITALS — BP 126/70 | HR 60 | Temp 97.5°F | Resp 14 | Ht 64.0 in | Wt 160.0 lb

## 2018-02-16 DIAGNOSIS — Z0000 Encounter for general adult medical examination without abnormal findings: Secondary | ICD-10-CM | POA: Diagnosis not present

## 2018-02-16 NOTE — Progress Notes (Signed)
Subjective:   Jodi Beasley is a 72 y.o. female who presents for Medicare Annual (Subsequent) preventive examination.  Review of Systems:  No ROS.  Medicare Wellness Visit. Additional risk factors are reflected in the social history.  Cardiac Risk Factors include: advanced age (>81men, >36 women);hypertension     Objective:     Vitals: BP 126/70 (BP Location: Left Arm, Patient Position: Sitting, Cuff Size: Normal)   Pulse 60   Temp (!) 97.5 F (36.4 C) (Oral)   Resp 14   Ht 5\' 4"  (1.626 m)   Wt 160 lb (72.6 kg)   SpO2 95%   BMI 27.46 kg/m   Body mass index is 27.46 kg/m.  Advanced Directives 02/16/2018 03/27/2017 03/25/2017 02/15/2017  Does Patient Have a Medical Advance Directive? Yes Yes Yes Yes  Type of Paramedic of [place];Living will Living will Living will [place];Living will  Does patient want to make changes to medical advance directive? No - Patient declined - - No - Patient declined  Copy of [place] in Chart? No - copy requested - - No - copy requested    Tobacco Social History   Tobacco Use  Smoking Status Current Every Day Smoker  . Packs/day: 1.00  . Years: 53.00  . Pack years: 53.00  . Types: E-cigarettes, Cigarettes  . Start date: 11/19/1960  Smokeless Tobacco Never Used  Tobacco Comment   down to 0.75ppd     Ready to quit: Not Answered Counseling given: Not Answered Comment: down to 0.75ppd   Clinical Intake:  Pre-visit preparation completed: Yes  Pain : No/denies pain     Nutritional Status: BMI 25 -29 Overweight Diabetes: No  How often do you need to have someone help you when you read instructions, pamphlets, or other written materials from your doctor or pharmacy?: 1 - Never  Interpreter Needed?: No     Past Medical History:  Diagnosis Date  . Arrhythmia   . Chicken pox   . COPD (chronic obstructive pulmonary disease) ([place])   . Coronary artery disease    NSTEMI in 09/2008. Cath : 80% RCA and 95% first diagonal. PCI and 2 DES placement  RCA and 1 DES to diagonal. Complicated by acute diagonal stent thrombosis . Treated by thrombectomy. Most recent nuclear stress test in 2010 showed no ischemia with normal EF.   Marland Kitchen Depression   . GERD (gastroesophageal reflux disease)   . Hay fever   . History of blood transfusion   . History of hypercholesterolemia   . History of syncope 2000   negative EP study  . Hyperlipidemia    intolerance to statins except Crestor.   . Hypertension   . Hypothyroid   . MI (myocardial infarction) ([place])   . Pneumonia, organism unspecified(486)    Past Surgical History:  Procedure Laterality Date  . CORONARY ANGIOPLASTY WITH STENT PLACEMENT  2009   [facility], drug-eluting mid RCA,prox diag;  . TONSILLECTOMY    . TUBAL LIGATION     Family History  Problem Relation Age of Onset  . Colon cancer Father   . Lung cancer Father        was a former smoker  . Cancer Father        lung  . Diabetes Father   . Hypertension Mother   . Hypertension Unknown   . Diabetes Unknown    Social History   Socioeconomic History  . Marital status: Single    Spouse name: None  .  Number of children: None  . Years of education: None  . Highest education level: None  Social Needs  . Financial resource strain: Not hard at all  . Food insecurity - worry: Never true  . Food insecurity - inability: Never true  . Transportation needs - medical: No  . Transportation needs - non-medical: No  Occupational History  . None  Tobacco Use  . Smoking status: Current Every Day Smoker    Packs/day: 1.00    Years: 53.00    Pack years: 53.00    Types: E-cigarettes, Cigarettes    Start date: 11/19/1960  . Smokeless tobacco: Never Used  . Tobacco comment: down to 0.75ppd  Substance and Sexual Activity  . Alcohol use: Yes    Alcohol/week: 0.6 oz    Types: 1 Standard drinks or equivalent per week  . Drug use: No  . Sexual activity:  Yes  Other Topics Concern  . None  Social History Narrative   Lives with mother in [place]. 2 cats 2 dogs in home.   Recently moved from [facility].   Custody of 65month old.    Outpatient Encounter Medications as of 02/16/2018  Medication Sig  . albuterol (PROAIR HFA) 108 (90 Base) MCG/ACT inhaler Inhale 1-2 puffs into the lungs every 6 (six) hours as needed for wheezing or shortness of breath.  Marland Kitchen aspirin 81 MG tablet Take 81 mg by mouth daily.  Marland Kitchen atorvastatin (LIPITOR) 80 MG tablet Take 1 tablet (80 mg total) by mouth daily.  Marland Kitchen BREO ELLIPTA 100-25 MCG/INH AEPB INHALE 1 PUFF BY MOUTH EVERY DAY  . buPROPion (WELLBUTRIN XL) 150 MG 24 hr tablet TAKE 1 TABLET(150 MG) BY MOUTH DAILY  . carisoprodol (SOMA) 350 MG tablet TAKE 1 TABLET BY MOUTH TWICE DAILY AS NEEDED FOR MUSCLE SPASMS  . Cholecalciferol (VITAMIN D-3) 1000 UNITS CAPS Take 1 capsule by mouth daily.  . citalopram (CELEXA) 40 MG tablet TAKE 1 TABLET(40 MG) BY MOUTH DAILY  . Coenzyme Q10 (COQ10) 400 MG CAPS Take 400 mg by mouth daily.  Marland Kitchen levothyroxine (SYNTHROID, LEVOTHROID) 112 MCG tablet TAKE 1 TABLET BY MOUTH EVERY DAY BEFORE BREAKFAST  . metoprolol tartrate (LOPRESSOR) 100 MG tablet TAKE 1 TABLET(100 MG) BY MOUTH TWICE DAILY  . Multiple Vitamin (MULTIVITAMIN) capsule Take 1 capsule by mouth daily.  . nitroGLYCERIN (NITROSTAT) 0.4 MG SL tablet Place 1 tablet (0.4 mg total) under the tongue every 5 (five) minutes as needed.  Marland Kitchen omeprazole (PRILOSEC) 20 MG capsule TAKE 1 CAPSULE BY MOUTH EVERY DAY  . vitamin B-12 (CYANOCOBALAMIN) 100 MCG tablet Take 100 mcg by mouth daily.   No facility-administered encounter medications on file as of 02/16/2018.     Activities of Daily Living In your present state of health, do you have any difficulty performing the following activities: 02/16/2018  Hearing? Y  Vision? N  Difficulty concentrating or making decisions? N  Walking or climbing stairs? N  Dressing or bathing? N  Doing errands,  shopping? N  Preparing Food and eating ? N  Using the Toilet? N  In the past six months, have you accidently leaked urine? N  Do you have problems with loss of bowel control? N  Managing your Medications? N  Managing your Finances? N  Housekeeping or managing your Housekeeping? N  Some recent data might be hidden    Patient Care Team: Leone Haven, MD as PCP - General (Family Medicine)    Assessment:   This is a routine wellness examination for  Jodi Beasley.  The goal of the wellness visit is to assist the patient how to close the gaps in care and create a preventative care plan for the patient.   The roster of all physicians providing medical care to patient is listed in the Snapshot section of the chart.  Taking calcium VIT D as appropriate/Osteoporosis risk reviewed.    Safety issues reviewed; Smoke and carbon monoxide detectors in the home. No firearms or firearms locked in a safe within the home. Wears seatbelts when driving or riding with others. No violence in the home.  They do not have excessive sun exposure.  Discussed the need for sun protection: hats, long sleeves and the use of sunscreen if there is significant sun exposure.  Patient is alert, normal appearance, oriented to person/place/and time. Correctly identified the president of the [place] and recalls of 3/3 words.  Performs simple calculations and can read correct time from watch face. Displays appropriate judgement.  No new identified risk were noted.  No failures at ADL's or IADL's.    BMI- discussed the importance of a healthy diet, water intake and the benefits of aerobic exercise. Educational material provided.   24 hour diet recall: Regular diet  Dental- every 6 months.    Eye- Visual acuity not assessed per patient preference since they have regular follow up with the ophthalmologist.  Wears corrective lenses.  Sleep patterns- Sleeps 6-8  hours at night.  Wakes feeling rested.   Health maintenance  gaps- closed.  Patient Concerns: None at this time. Follow up with PCP as needed.  Exercise Activities and Dietary recommendations Current Exercise Habits: Home exercise routine, Type of exercise: walking, Time (Minutes): 20, Intensity: Mild  Goals    . Healthy Lifestyle     Low carb diet Exercise  Stay hydrated        Fall Risk Fall Risk  02/16/2018 12/27/2017 12/12/2017 02/15/2017 08/03/2016  Falls in the past year? No Yes Yes Yes No  Comment - - - - Emmi Telephone Survey: data to providers prior to load  Number falls in past yr: - 1 1 2  or more -  Comment - - fell over parking lot after mother passed away - -  Injury with Fall? - Yes Yes Yes -  Comment - - - Tripped over the curb when stepping.  Slipped on puddle of water after mopping. -  Risk for fall due to : - Impaired balance/gait - - -  Follow up - Education provided Education provided;Falls prevention discussed Falls prevention discussed -  Comment - vert, out of alignment, going to chiropactor - - -   Depression Screen PHQ 2/9 Scores 02/16/2018 12/27/2017 12/12/2017 03/31/2017  PHQ - 2 Score 0 0 0 2  PHQ- 9 Score - - - 17     Cognitive Function MMSE - Mini Mental State Exam 02/15/2017  Orientation to time 5  Orientation to Place 5  Registration 3  Attention/ Calculation 5  Recall 3  Language- name 2 objects 2  Language- repeat 1  Language- follow 3 step command 3  Language- read & follow direction 1  Write a sentence 1  Copy design 1  Total score 30     6CIT Screen 02/16/2018  What Year? 0 points  What month? 0 points  What time? 0 points  Count back from 20 0 points  Months in reverse 0 points  Repeat phrase 0 points  Total Score 0    Immunization History  Administered Date(s) Administered  .  Influenza Split 10/22/2012, 09/20/2014, 09/21/2015, 08/15/2016  . Influenza Whole 10/17/2013  . Influenza, High Dose Seasonal PF 08/23/2017  . Influenza-Unspecified 09/05/2015  . Pneumococcal Conjugate-13  12/06/2007  . Pneumococcal Polysaccharide-23 11/19/2013  . Td 05/05/2010  . Zoster 04/20/2011   Screening Tests Health Maintenance  Topic Date Due  . MAMMOGRAM  03/10/2019  . COLONOSCOPY  04/19/2020  . TETANUS/TDAP  05/05/2020  . INFLUENZA VACCINE  Completed  . DEXA SCAN  Completed  . Hepatitis C Screening  Completed  . PNA vac Low Risk Adult  Completed      Plan:    End of life planning; Advance aging; Advanced directives discussed. Copy of current HCPOA/Living Will requested.    I have personally reviewed and noted the following in the patient's chart:   . Medical and social history . Use of alcohol, tobacco or illicit drugs  . Current medications and supplements . Functional ability and status . Nutritional status . Physical activity . Advanced directives . List of other physicians . Hospitalizations, surgeries, and ER visits in previous 12 months . Vitals . Screenings to include cognitive, depression, and falls . Referrals and appointments  In addition, I have reviewed and discussed with patient certain preventive protocols, quality metrics, and best practice recommendations. A written personalized care plan for preventive services as well as general preventive health recommendations were provided to patient.     Varney Biles, LPN  [id number]

## 2018-02-16 NOTE — Patient Instructions (Addendum)
  Jodi Beasley , Thank you for taking time to come for your Medicare Wellness Visit. I appreciate your ongoing commitment to your health goals. Please review the following plan we discussed and let me know if I can assist you in the future.   These are the goals we discussed: Goals    . Healthy Lifestyle     Low carb diet Exercise  Stay hydrated        This is a list of the screening recommended for you and due dates:  Health Maintenance  Topic Date Due  . Mammogram  03/10/2019  . Colon Cancer Screening  04/19/2020  . Tetanus Vaccine  05/05/2020  . Flu Shot  Completed  . DEXA scan (bone density measurement)  Completed  .  Hepatitis C: One time screening is recommended by Center for Disease Control  (CDC) for  adults born from [birth year] through 1964.   Completed  . Pneumonia vaccines  Completed

## 2018-02-23 NOTE — Telephone Encounter (Signed)
It appears this is already approved.

## 2018-02-27 DIAGNOSIS — M5416 Radiculopathy, lumbar region: Secondary | ICD-10-CM | POA: Diagnosis not present

## 2018-02-27 DIAGNOSIS — M9903 Segmental and somatic dysfunction of lumbar region: Secondary | ICD-10-CM | POA: Diagnosis not present

## 2018-02-27 DIAGNOSIS — M9901 Segmental and somatic dysfunction of cervical region: Secondary | ICD-10-CM | POA: Diagnosis not present

## 2018-02-27 DIAGNOSIS — M5033 Other cervical disc degeneration, cervicothoracic region: Secondary | ICD-10-CM | POA: Diagnosis not present

## 2018-02-28 ENCOUNTER — Ambulatory Visit: Payer: Self-pay | Admitting: Family Medicine

## 2018-03-06 DIAGNOSIS — M5416 Radiculopathy, lumbar region: Secondary | ICD-10-CM | POA: Diagnosis not present

## 2018-03-06 DIAGNOSIS — M5033 Other cervical disc degeneration, cervicothoracic region: Secondary | ICD-10-CM | POA: Diagnosis not present

## 2018-03-06 DIAGNOSIS — M9903 Segmental and somatic dysfunction of lumbar region: Secondary | ICD-10-CM | POA: Diagnosis not present

## 2018-03-06 DIAGNOSIS — M9901 Segmental and somatic dysfunction of cervical region: Secondary | ICD-10-CM | POA: Diagnosis not present

## 2018-03-22 ENCOUNTER — Ambulatory Visit: Payer: Self-pay | Admitting: Cardiovascular Disease

## 2018-03-22 ENCOUNTER — Other Ambulatory Visit: Payer: Self-pay | Admitting: Family Medicine

## 2018-03-22 DIAGNOSIS — M5033 Other cervical disc degeneration, cervicothoracic region: Secondary | ICD-10-CM | POA: Diagnosis not present

## 2018-03-22 DIAGNOSIS — M5416 Radiculopathy, lumbar region: Secondary | ICD-10-CM | POA: Diagnosis not present

## 2018-03-22 DIAGNOSIS — M9903 Segmental and somatic dysfunction of lumbar region: Secondary | ICD-10-CM | POA: Diagnosis not present

## 2018-03-22 DIAGNOSIS — M9901 Segmental and somatic dysfunction of cervical region: Secondary | ICD-10-CM | POA: Diagnosis not present

## 2018-03-25 ENCOUNTER — Other Ambulatory Visit: Payer: Self-pay | Admitting: Family Medicine

## 2018-03-26 NOTE — Telephone Encounter (Signed)
Refilled: 02/06/2018 Last OV: 11/17/2017 Next OV: 03/27/2018

## 2018-03-26 NOTE — Telephone Encounter (Signed)
Will refill at appt on 03/27/18 due to cancellation history.

## 2018-03-27 ENCOUNTER — Ambulatory Visit (INDEPENDENT_AMBULATORY_CARE_PROVIDER_SITE_OTHER): Payer: Medicare Other | Admitting: Family Medicine

## 2018-03-27 ENCOUNTER — Encounter: Payer: Self-pay | Admitting: Family Medicine

## 2018-03-27 VITALS — BP 124/76 | HR 65 | Temp 98.6°F | Ht 64.0 in | Wt 159.2 lb

## 2018-03-27 DIAGNOSIS — E039 Hypothyroidism, unspecified: Secondary | ICD-10-CM | POA: Diagnosis not present

## 2018-03-27 DIAGNOSIS — M5441 Lumbago with sciatica, right side: Secondary | ICD-10-CM

## 2018-03-27 DIAGNOSIS — F4323 Adjustment disorder with mixed anxiety and depressed mood: Secondary | ICD-10-CM | POA: Diagnosis not present

## 2018-03-27 DIAGNOSIS — M5442 Lumbago with sciatica, left side: Secondary | ICD-10-CM

## 2018-03-27 DIAGNOSIS — Z1231 Encounter for screening mammogram for malignant neoplasm of breast: Secondary | ICD-10-CM

## 2018-03-27 DIAGNOSIS — G8929 Other chronic pain: Secondary | ICD-10-CM

## 2018-03-27 DIAGNOSIS — Z1239 Encounter for other screening for malignant neoplasm of breast: Secondary | ICD-10-CM

## 2018-03-27 DIAGNOSIS — E7800 Pure hypercholesterolemia, unspecified: Secondary | ICD-10-CM

## 2018-03-27 DIAGNOSIS — J0100 Acute maxillary sinusitis, unspecified: Secondary | ICD-10-CM | POA: Diagnosis not present

## 2018-03-27 MED ORDER — FLUTICASONE PROPIONATE 50 MCG/ACT NA SUSP: 2.0000 | Freq: Every day | NASAL | 6 refills | Status: DC

## 2018-03-27 MED ORDER — DOXYCYCLINE HYCLATE 100 MG PO TABS: 100.0000 mg | ORAL_TABLET | Freq: Two times a day (BID) | ORAL | 0 refills | Status: DC

## 2018-03-27 MED ORDER — BUPROPION HCL ER (XL) 150 MG PO TB24: ORAL_TABLET | ORAL | 1 refills | Status: DC

## 2018-03-27 MED ORDER — CITALOPRAM HYDROBROMIDE 40 MG PO TABS: ORAL_TABLET | ORAL | 1 refills | Status: DC

## 2018-03-27 NOTE — Assessment & Plan Note (Signed)
Continue Synthroid.  Check TSH. 

## 2018-03-27 NOTE — Assessment & Plan Note (Signed)
She will return for fasting lipid panel.

## 2018-03-27 NOTE — Assessment & Plan Note (Signed)
Chronic issue.  Stable at this time.  She will continue to see her chiropractor and continue Soma.  Advised against drinking alcohol with this.

## 2018-03-27 NOTE — Assessment & Plan Note (Signed)
Patient with likely sinusitis as well as possible bronchitis.  Lung sounds are clear.  Will cover with doxycycline.  She can use her albuterol inhaler if the pollen is causing some dyspnea.  She is given return precautions.

## 2018-03-27 NOTE — Assessment & Plan Note (Signed)
Seems to be relatively stable.  She will continue Celexa and Wellbutrin.  Given return precautions.

## 2018-03-27 NOTE — Patient Instructions (Signed)
Nice to see you. Please call to get your mammogram scheduled. We will treat you for a sinus infection and bronchitis with doxycycline.  Please start the Flonase as well. We will have you return for fasting lab work sometime in the next week.

## 2018-03-27 NOTE — Progress Notes (Signed)
[name], MD Phone: [phone]  Jodi Beasley is a 72 y.o. female who presents today for f/u.  HYPOTHYROIDISM Disease Monitoring Weight changes: no  Skin Changes: no Heat/Cold intolerance: no  Medication Monitoring Compliance:  Taking synthroid   Last TSH:   Lab Results  Component Value Date   TSH 0.96 08/23/2017   Patient reports upper respiratory symptoms for the last several weeks.  Started with nasal and sinus congestion and then developed chest congestion and cough productive of green mucus.  Some short windedness at times with the pollen.  No chest pain.  No ear fullness.  Still has some maxillary sinus congestion.  No fevers.  She has been using over-the-counter medications.  She has chronic low back pain.  She follows with her chiropractor which has been very beneficial.  She sees them every 10 days.  She did see pain management and will go back if her back gives out again though otherwise will not return.  She continues on [place].  No drowsiness with this.  She does not drink alcohol with this.  No numbness, weakness, radiation, incontinence, or saddle anesthesia.  Depression: Notes this comes and goes.  Her nephew in law passed away from cancer recently.  She is going to the beach with her partner's ashes who passed away 4 years ago.  She continues on Wellbutrin and Celexa which were beneficial.  No SI.   Social History   Tobacco Use  Smoking Status Current Every Day Smoker  . Packs/day: 1.00  . Years: 53.00  . Pack years: 53.00  . Types: E-cigarettes, Cigarettes  . Start date: 11/19/1960  Smokeless Tobacco Never Used  Tobacco Comment   down to 0.75ppd     ROS see history of present illness  Objective  Physical Exam Vitals:   03/27/18 1320  BP: 124/76  Pulse: 65  Temp: 98.6 F (37 C)  SpO2: 95%    BP Readings from Last 3 Encounters:  03/27/18 124/76  02/16/18 126/70  12/29/17 126/68   Wt Readings from Last 3 Encounters:  03/27/18 159 lb  3.2 oz (72.2 kg)  02/16/18 160 lb (72.6 kg)  12/29/17 157 lb (71.2 kg)    Physical Exam  Constitutional: No distress.  HENT:  Head: Normocephalic and atraumatic.  Mouth/Throat: Oropharynx is clear and moist. No oropharyngeal exudate.  Normal TMs  Eyes: Pupils are equal, round, and reactive to light. Conjunctivae are normal.  Neck: Neck supple.  Cardiovascular: Normal rate, regular rhythm and normal heart sounds.  Pulmonary/Chest: Effort normal and breath sounds normal.  Musculoskeletal: She exhibits no edema.  Lymphadenopathy:    She has no cervical adenopathy.  Neurological: She is alert.  Skin: Skin is warm and dry. She is not diaphoretic.     Assessment/Plan: Please see individual problem list.  Hypercholesterolemia She will return for fasting lipid panel.  Adjustment disorder with mixed anxiety and depressed mood Seems to be relatively stable.  She will continue Celexa and Wellbutrin.  Given return precautions.  Chronic low back pain (Primary Area of Pain) (Bilateral) (L>R) Chronic issue.  Stable at this time.  She will continue to see her chiropractor and continue Soma.  Advised against drinking alcohol with this.  Hypothyroidism Continue Synthroid.  Check TSH.  Sinusitis Patient with likely sinusitis as well as possible bronchitis.  Lung sounds are clear.  Will cover with doxycycline.  She can use her albuterol inhaler if the pollen is causing some dyspnea.  She is given return precautions.   Health  Maintenance: Mammogram ordered.  Patient will schedule herself.  Orders Placed This Encounter  Procedures  . MM 3D SCREEN BREAST BILATERAL    Standing Status:   Future    Standing Expiration Date:   05/28/2019    Order Specific Question:   Reason for Exam (SYMPTOM  OR DIAGNOSIS REQUIRED)    Answer:   breast cancer screening    Order Specific Question:   Preferred imaging location?    Answer:   [facility]  . Lipid panel    Standing Status:   Future     Standing Expiration Date:   03/28/2019  . Comp Met (CMET)    Standing Status:   Future    Standing Expiration Date:   03/28/2019  . TSH    Standing Status:   Future    Standing Expiration Date:   03/28/2019    Meds ordered this encounter  Medications  . buPROPion (WELLBUTRIN XL) 150 MG 24 hr tablet    Sig: TAKE 1 TABLET(150 MG) BY MOUTH DAILY    Dispense:  90 tablet    Refill:  1  . citalopram (CELEXA) 40 MG tablet    Sig: TAKE 1 TABLET(40 MG) BY MOUTH DAILY    Dispense:  90 tablet    Refill:  1  . fluticasone (FLONASE) 50 MCG/ACT nasal spray    Sig: Place 2 sprays into both nostrils daily.    Dispense:  16 g    Refill:  6  . doxycycline (VIBRA-TABS) 100 MG tablet    Sig: Take 1 tablet (100 mg total) by mouth 2 (two) times daily.    Dispense:  14 tablet    Refill:  0     [name], MD [place]

## 2018-03-28 ENCOUNTER — Other Ambulatory Visit (INDEPENDENT_AMBULATORY_CARE_PROVIDER_SITE_OTHER): Payer: Medicare Other

## 2018-03-28 ENCOUNTER — Telehealth: Payer: Self-pay | Admitting: Family Medicine

## 2018-03-28 ENCOUNTER — Other Ambulatory Visit: Payer: Self-pay | Admitting: Family Medicine

## 2018-03-28 DIAGNOSIS — E7800 Pure hypercholesterolemia, unspecified: Secondary | ICD-10-CM | POA: Diagnosis not present

## 2018-03-28 DIAGNOSIS — E039 Hypothyroidism, unspecified: Secondary | ICD-10-CM | POA: Diagnosis not present

## 2018-03-28 DIAGNOSIS — R7309 Other abnormal glucose: Secondary | ICD-10-CM

## 2018-03-28 DIAGNOSIS — E871 Hypo-osmolality and hyponatremia: Secondary | ICD-10-CM

## 2018-03-28 LAB — COMPREHENSIVE METABOLIC PANEL
ALT: 14 U/L (ref 0–35)
AST: 17 U/L (ref 0–37)
Albumin: 3.9 g/dL (ref 3.5–5.2)
Alkaline Phosphatase: 82 U/L (ref 39–117)
BUN: 11 mg/dL (ref 6–23)
CHLORIDE: 99 meq/L (ref 96–112)
CO2: 28 meq/L (ref 19–32)
CREATININE: 0.84 mg/dL (ref 0.40–1.20)
Calcium: 9.1 mg/dL (ref 8.4–10.5)
GFR: 71.04 mL/min (ref >60.00)
Glucose, Bld: 125 mg/dL — ABNORMAL HIGH (ref 70–99)
POTASSIUM: 4.3 meq/L (ref 3.5–5.1)
SODIUM: 131 meq/L — AB (ref 135–145)
Total Bilirubin: 0.4 mg/dL (ref 0.2–1.2)
Total Protein: 7.1 g/dL (ref 6.0–8.3)

## 2018-03-28 LAB — LIPID PANEL
CHOL/HDL RATIO: 3
CHOLESTEROL: 174 mg/dL (ref 0–200)
HDL: 50.2 mg/dL (ref >39.00)
LDL Cholesterol: 84 mg/dL (ref 0–99)
NonHDL: 124.01
TRIGLYCERIDES: 198 mg/dL — AB (ref 0.0–149.0)
VLDL: 39.6 mg/dL (ref 0.0–40.0)

## 2018-03-28 LAB — TSH: TSH: 3.99 u[IU]/mL (ref 0.35–4.50)

## 2018-03-28 MED ORDER — CARISOPRODOL 350 MG PO TABS: ORAL_TABLET | ORAL | 0 refills | Status: DC

## 2018-03-28 NOTE — Telephone Encounter (Signed)
Copied from [place] [phone]. Topic: Quick Communication - See Telephone Encounter >> Mar 28, 2018  1:05 PM Boyd Kerbs wrote: CRM for notification.   Pt. Called and said the [facility] on [address] and they did not have medication.   They were going to send in a request for a different medication, but [facility] the new [facility] (other) and they are filling it there   carisoprodol (SOMA) 350 MG tablet  This is to make sure there is not a double medication   See Telephone encounter for: 03/28/18.

## 2018-03-28 NOTE — Telephone Encounter (Signed)
Left message to return call, please transfer to me

## 2018-03-28 NOTE — Telephone Encounter (Signed)
Please send to new pharmacy

## 2018-03-28 NOTE — Telephone Encounter (Signed)
Sent to pharmacy 

## 2018-03-28 NOTE — Telephone Encounter (Signed)
Copied from [place]. Topic: Quick Communication - See Telephone Encounter >> Mar 28, 2018  2:26 PM Cleaster Corin, NT wrote: CRM for notification. See Telephone encounter for: 03/28/18.  [facility] pharmacy calling to let Dr. Milbert Coulter know that the med. carisoprodol (SOMA) 350 MG tablet [[id number]] is on back order and was seeing if something else could be called in for pt. [facility] stated that they have a few pills they can give pt. But pt. Stated that she will be going out of town and will need more.  [facility] [place], [place] - [place] [address] Phone: [phone] Fax: [phone]

## 2018-04-02 DIAGNOSIS — M5033 Other cervical disc degeneration, cervicothoracic region: Secondary | ICD-10-CM | POA: Diagnosis not present

## 2018-04-02 DIAGNOSIS — M9901 Segmental and somatic dysfunction of cervical region: Secondary | ICD-10-CM | POA: Diagnosis not present

## 2018-04-02 DIAGNOSIS — M5416 Radiculopathy, lumbar region: Secondary | ICD-10-CM | POA: Diagnosis not present

## 2018-04-02 DIAGNOSIS — M9903 Segmental and somatic dysfunction of lumbar region: Secondary | ICD-10-CM | POA: Diagnosis not present

## 2018-04-08 ENCOUNTER — Other Ambulatory Visit: Payer: Self-pay | Admitting: Family Medicine

## 2018-04-13 DIAGNOSIS — M9903 Segmental and somatic dysfunction of lumbar region: Secondary | ICD-10-CM | POA: Diagnosis not present

## 2018-04-13 DIAGNOSIS — M5033 Other cervical disc degeneration, cervicothoracic region: Secondary | ICD-10-CM | POA: Diagnosis not present

## 2018-04-13 DIAGNOSIS — M9901 Segmental and somatic dysfunction of cervical region: Secondary | ICD-10-CM | POA: Diagnosis not present

## 2018-04-13 DIAGNOSIS — M5416 Radiculopathy, lumbar region: Secondary | ICD-10-CM | POA: Diagnosis not present

## 2018-04-16 ENCOUNTER — Inpatient Hospital Stay
Admission: EM | Admit: 2018-04-16 | Discharge: 2018-04-21 | DRG: 871 | Disposition: A | Discharge status: Home Health | Payer: Medicare Other | Attending: Internal Medicine | Admitting: Internal Medicine

## 2018-04-16 ENCOUNTER — Other Ambulatory Visit: Payer: Self-pay

## 2018-04-16 ENCOUNTER — Emergency Department: Payer: Medicare Other

## 2018-04-16 ENCOUNTER — Encounter: Payer: Self-pay | Admitting: Emergency Medicine

## 2018-04-16 DIAGNOSIS — Z9181 History of falling: Secondary | ICD-10-CM

## 2018-04-16 DIAGNOSIS — E039 Hypothyroidism, unspecified: Secondary | ICD-10-CM | POA: Diagnosis present

## 2018-04-16 DIAGNOSIS — F329 Major depressive disorder, single episode, unspecified: Secondary | ICD-10-CM | POA: Diagnosis present

## 2018-04-16 DIAGNOSIS — A4151 Sepsis due to Escherichia coli [E. coli]: Principal | ICD-10-CM | POA: Diagnosis present

## 2018-04-16 DIAGNOSIS — N170 Acute kidney failure with tubular necrosis: Secondary | ICD-10-CM | POA: Diagnosis present

## 2018-04-16 DIAGNOSIS — J189 Pneumonia, unspecified organism: Secondary | ICD-10-CM | POA: Diagnosis present

## 2018-04-16 DIAGNOSIS — F17210 Nicotine dependence, cigarettes, uncomplicated: Secondary | ICD-10-CM | POA: Diagnosis present

## 2018-04-16 DIAGNOSIS — Z801 Family history of malignant neoplasm of trachea, bronchus and lung: Secondary | ICD-10-CM

## 2018-04-16 DIAGNOSIS — Z7951 Long term (current) use of inhaled steroids: Secondary | ICD-10-CM

## 2018-04-16 DIAGNOSIS — K219 Gastro-esophageal reflux disease without esophagitis: Secondary | ICD-10-CM | POA: Diagnosis present

## 2018-04-16 DIAGNOSIS — E871 Hypo-osmolality and hyponatremia: Secondary | ICD-10-CM | POA: Diagnosis not present

## 2018-04-16 DIAGNOSIS — Z888 Allergy status to other drugs, medicaments and biological substances status: Secondary | ICD-10-CM

## 2018-04-16 DIAGNOSIS — Z9851 Tubal ligation status: Secondary | ICD-10-CM

## 2018-04-16 DIAGNOSIS — R296 Repeated falls: Secondary | ICD-10-CM | POA: Diagnosis present

## 2018-04-16 DIAGNOSIS — R197 Diarrhea, unspecified: Secondary | ICD-10-CM

## 2018-04-16 DIAGNOSIS — I252 Old myocardial infarction: Secondary | ICD-10-CM

## 2018-04-16 DIAGNOSIS — Z7982 Long term (current) use of aspirin: Secondary | ICD-10-CM

## 2018-04-16 DIAGNOSIS — E785 Hyperlipidemia, unspecified: Secondary | ICD-10-CM | POA: Diagnosis present

## 2018-04-16 DIAGNOSIS — A09 Infectious gastroenteritis and colitis, unspecified: Secondary | ICD-10-CM | POA: Diagnosis present

## 2018-04-16 DIAGNOSIS — Z716 Tobacco abuse counseling: Secondary | ICD-10-CM | POA: Diagnosis not present

## 2018-04-16 DIAGNOSIS — J440 Chronic obstructive pulmonary disease with acute lower respiratory infection: Secondary | ICD-10-CM | POA: Diagnosis present

## 2018-04-16 DIAGNOSIS — Z955 Presence of coronary angioplasty implant and graft: Secondary | ICD-10-CM

## 2018-04-16 DIAGNOSIS — M6281 Muscle weakness (generalized): Secondary | ICD-10-CM | POA: Diagnosis not present

## 2018-04-16 DIAGNOSIS — I10 Essential (primary) hypertension: Secondary | ICD-10-CM | POA: Diagnosis present

## 2018-04-16 DIAGNOSIS — E872 Acidosis: Secondary | ICD-10-CM | POA: Diagnosis present

## 2018-04-16 DIAGNOSIS — A419 Sepsis, unspecified organism: Secondary | ICD-10-CM | POA: Diagnosis not present

## 2018-04-16 DIAGNOSIS — E860 Dehydration: Secondary | ICD-10-CM

## 2018-04-16 DIAGNOSIS — F17290 Nicotine dependence, other tobacco product, uncomplicated: Secondary | ICD-10-CM | POA: Diagnosis present

## 2018-04-16 DIAGNOSIS — E861 Hypovolemia: Secondary | ICD-10-CM | POA: Diagnosis present

## 2018-04-16 DIAGNOSIS — N390 Urinary tract infection, site not specified: Secondary | ICD-10-CM | POA: Diagnosis not present

## 2018-04-16 DIAGNOSIS — Z800 Family history of malignant neoplasm of digestive organs: Secondary | ICD-10-CM

## 2018-04-16 DIAGNOSIS — Z884 Allergy status to anesthetic agent status: Secondary | ICD-10-CM

## 2018-04-16 DIAGNOSIS — R42 Dizziness and giddiness: Secondary | ICD-10-CM | POA: Diagnosis not present

## 2018-04-16 DIAGNOSIS — R7881 Bacteremia: Secondary | ICD-10-CM | POA: Diagnosis not present

## 2018-04-16 DIAGNOSIS — Z79890 Hormone replacement therapy (postmenopausal): Secondary | ICD-10-CM

## 2018-04-16 DIAGNOSIS — Z881 Allergy status to other antibiotic agents status: Secondary | ICD-10-CM

## 2018-04-16 DIAGNOSIS — D72829 Elevated white blood cell count, unspecified: Secondary | ICD-10-CM | POA: Diagnosis not present

## 2018-04-16 DIAGNOSIS — R531 Weakness: Secondary | ICD-10-CM | POA: Diagnosis not present

## 2018-04-16 DIAGNOSIS — N179 Acute kidney failure, unspecified: Secondary | ICD-10-CM

## 2018-04-16 DIAGNOSIS — I2510 Atherosclerotic heart disease of native coronary artery without angina pectoris: Secondary | ICD-10-CM | POA: Diagnosis present

## 2018-04-16 DIAGNOSIS — Z8249 Family history of ischemic heart disease and other diseases of the circulatory system: Secondary | ICD-10-CM

## 2018-04-16 DIAGNOSIS — F17200 Nicotine dependence, unspecified, uncomplicated: Secondary | ICD-10-CM | POA: Diagnosis not present

## 2018-04-16 LAB — COMPREHENSIVE METABOLIC PANEL
ALBUMIN: 3 g/dL — AB (ref 3.5–5.0)
ALT: 28 U/L (ref 14–54)
AST: 44 U/L — AB (ref 15–41)
Alkaline Phosphatase: 95 U/L (ref 38–126)
Anion gap: 15 (ref 5–15)
BUN: 47 mg/dL — ABNORMAL HIGH (ref 6–20)
CHLORIDE: 83 mmol/L — AB (ref 101–111)
CO2: 17 mmol/L — AB (ref 22–32)
Calcium: 8.4 mg/dL — ABNORMAL LOW (ref 8.9–10.3)
Creatinine, Ser: 3.23 mg/dL — ABNORMAL HIGH (ref 0.44–1.00)
GFR calc non Af Amer: 13 mL/min — ABNORMAL LOW (ref >60)
GFR, EST AFRICAN AMERICAN: 16 mL/min — AB (ref >60)
GLUCOSE: 148 mg/dL — AB (ref 65–99)
Potassium: 3.9 mmol/L (ref 3.5–5.1)
SODIUM: 115 mmol/L — AB (ref 135–145)
Total Bilirubin: 1.1 mg/dL (ref 0.3–1.2)
Total Protein: 7 g/dL (ref 6.5–8.1)

## 2018-04-16 LAB — DIFFERENTIAL
BASOS PCT: 0 %
Basophils Absolute: 0 10*3/uL (ref 0–0.1)
EOS ABS: 0.3 10*3/uL (ref 0–0.7)
EOS PCT: 1 %
LYMPHS PCT: 2 %
Lymphs Abs: 0.6 10*3/uL — ABNORMAL LOW (ref 1.0–3.6)
MONO ABS: 1.5 10*3/uL — AB (ref 0.2–0.9)
MONOS PCT: 5 %
NEUTROS ABS: 27.6 10*3/uL — AB (ref 1.4–6.5)
Neutrophils Relative %: 92 %

## 2018-04-16 LAB — CBC
HCT: 39.7 % (ref 35.0–47.0)
Hemoglobin: 13.8 g/dL (ref 12.0–16.0)
MCH: 29.8 pg (ref 26.0–34.0)
MCHC: 34.7 g/dL (ref 32.0–36.0)
MCV: 86.1 fL (ref 80.0–100.0)
PLATELETS: 136 10*3/uL — AB (ref 150–440)
RBC: 4.61 MIL/uL (ref 3.80–5.20)
RDW: 14.2 % (ref 11.5–14.5)
WBC: 30.2 10*3/uL — ABNORMAL HIGH (ref 3.6–11.0)

## 2018-04-16 LAB — LACTIC ACID, PLASMA: Lactic Acid, Venous: 1.8 mmol/L (ref 0.5–1.9)

## 2018-04-16 LAB — URIC ACID: Uric Acid, Serum: 7.9 mg/dL — ABNORMAL HIGH (ref 2.3–6.6)

## 2018-04-16 LAB — LIPASE, BLOOD: LIPASE: 21 U/L (ref 11–51)

## 2018-04-16 LAB — GLUCOSE, CAPILLARY: Glucose-Capillary: 115 mg/dL — ABNORMAL HIGH (ref 65–99)

## 2018-04-16 LAB — SODIUM: Sodium: 119 mmol/L — CL (ref 135–145)

## 2018-04-16 LAB — TSH: TSH: 2.848 u[IU]/mL (ref 0.350–4.500)

## 2018-04-16 MED ORDER — CEFTRIAXONE SODIUM 1 G IJ SOLR
1.0000 g | Freq: Once | INTRAMUSCULAR | Status: AC
  Administered 2018-04-16: 1 g via INTRAVENOUS
  Filled 2018-04-16: qty 10

## 2018-04-16 MED ORDER — ACETAMINOPHEN 325 MG PO TABS
650.0000 mg | ORAL_TABLET | Freq: Four times a day (QID) | ORAL | Status: DC | PRN
  Administered 2018-04-16: 650 mg via ORAL
  Filled 2018-04-16: qty 2

## 2018-04-16 MED ORDER — PANTOPRAZOLE SODIUM 40 MG PO TBEC
40.0000 mg | DELAYED_RELEASE_TABLET | Freq: Every day | ORAL | Status: DC
  Administered 2018-04-16 – 2018-04-21 (×6): 40 mg via ORAL
  Filled 2018-04-16 (×6): qty 1

## 2018-04-16 MED ORDER — ASPIRIN 81 MG PO CHEW
81.0000 mg | CHEWABLE_TABLET | Freq: Every day | ORAL | Status: DC
  Administered 2018-04-16 – 2018-04-21 (×6): 81 mg via ORAL
  Filled 2018-04-16 (×6): qty 1

## 2018-04-16 MED ORDER — ALBUTEROL SULFATE HFA 108 (90 BASE) MCG/ACT IN AERS: 1.0000 | INHALATION_SPRAY | Freq: Four times a day (QID) | RESPIRATORY_TRACT | Status: DC | PRN

## 2018-04-16 MED ORDER — ONDANSETRON HCL 4 MG/2ML IJ SOLN
4.0000 mg | Freq: Once | INTRAMUSCULAR | Status: AC
  Administered 2018-04-16: 4 mg via INTRAVENOUS
  Filled 2018-04-16: qty 2

## 2018-04-16 MED ORDER — SODIUM CHLORIDE 0.9 % IV SOLN
INTRAVENOUS | Status: DC
  Administered 2018-04-16 – 2018-04-17 (×2): via INTRAVENOUS

## 2018-04-16 MED ORDER — DOXYCYCLINE HYCLATE 100 MG PO TABS
100.0000 mg | ORAL_TABLET | Freq: Once | ORAL | Status: AC
  Administered 2018-04-16: 100 mg via ORAL
  Filled 2018-04-16: qty 1

## 2018-04-16 MED ORDER — FLUTICASONE PROPIONATE 50 MCG/ACT NA SUSP
2.0000 | Freq: Every day | NASAL | Status: DC
  Administered 2018-04-16 – 2018-04-20 (×5): 2 via NASAL
  Filled 2018-04-16: qty 16

## 2018-04-16 MED ORDER — SODIUM CHLORIDE 0.9 % IV BOLUS
1000.0000 mL | Freq: Once | INTRAVENOUS | Status: AC
  Administered 2018-04-16: 1000 mL via INTRAVENOUS

## 2018-04-16 MED ORDER — ADULT MULTIVITAMIN W/MINERALS CH
1.0000 | ORAL_TABLET | Freq: Every day | ORAL | Status: DC
  Administered 2018-04-16 – 2018-04-21 (×6): 1 via ORAL
  Filled 2018-04-16 (×6): qty 1

## 2018-04-16 MED ORDER — ATORVASTATIN CALCIUM 20 MG PO TABS
40.0000 mg | ORAL_TABLET | Freq: Every day | ORAL | Status: DC
  Administered 2018-04-16 – 2018-04-20 (×5): 40 mg via ORAL
  Filled 2018-04-16 (×5): qty 2

## 2018-04-16 MED ORDER — ONDANSETRON HCL 4 MG PO TABS: 4.0000 mg | ORAL_TABLET | Freq: Four times a day (QID) | ORAL | Status: DC | PRN

## 2018-04-16 MED ORDER — CARISOPRODOL 350 MG PO TABS
350.0000 mg | ORAL_TABLET | Freq: Every day | ORAL | Status: DC
  Administered 2018-04-16 – 2018-04-20 (×5): 350 mg via ORAL
  Filled 2018-04-16 (×5): qty 1

## 2018-04-16 MED ORDER — ACETAMINOPHEN 650 MG RE SUPP: 650.0000 mg | Freq: Four times a day (QID) | RECTAL | Status: DC | PRN

## 2018-04-16 MED ORDER — ONDANSETRON HCL 4 MG/2ML IJ SOLN: 4.0000 mg | Freq: Four times a day (QID) | INTRAMUSCULAR | Status: DC | PRN

## 2018-04-16 MED ORDER — BUPROPION HCL ER (XL) 150 MG PO TB24
150.0000 mg | ORAL_TABLET | Freq: Every day | ORAL | Status: DC
  Administered 2018-04-16 – 2018-04-21 (×6): 150 mg via ORAL
  Filled 2018-04-16 (×7): qty 1

## 2018-04-16 MED ORDER — SODIUM CHLORIDE 0.9 % IV SOLN
2.0000 g | INTRAVENOUS | Status: DC
  Filled 2018-04-16: qty 20

## 2018-04-16 MED ORDER — POLYETHYLENE GLYCOL 3350 17 G PO PACK: 17.0000 g | PACK | Freq: Every day | ORAL | Status: DC | PRN

## 2018-04-16 MED ORDER — VITAMIN B-12 100 MCG PO TABS
100.0000 ug | ORAL_TABLET | Freq: Every day | ORAL | Status: DC
  Administered 2018-04-16 – 2018-04-21 (×6): 100 ug via ORAL
  Filled 2018-04-16 (×6): qty 1

## 2018-04-16 MED ORDER — LEVOTHYROXINE SODIUM 112 MCG PO TABS
112.0000 ug | ORAL_TABLET | Freq: Every day | ORAL | Status: DC
  Administered 2018-04-17 – 2018-04-21 (×5): 112 ug via ORAL
  Filled 2018-04-16 (×6): qty 1

## 2018-04-16 MED ORDER — ENOXAPARIN SODIUM 30 MG/0.3ML ~~LOC~~ SOLN
30.0000 mg | SUBCUTANEOUS | Status: DC
  Administered 2018-04-16: 30 mg via SUBCUTANEOUS
  Filled 2018-04-16: qty 0.3

## 2018-04-16 MED ORDER — COQ10 400 MG PO CAPS: 400.0000 mg | ORAL_CAPSULE | Freq: Every day | ORAL | Status: DC

## 2018-04-16 MED ORDER — VANCOMYCIN 50 MG/ML ORAL SOLUTION
125.0000 mg | Freq: Four times a day (QID) | ORAL | Status: DC
  Administered 2018-04-16 – 2018-04-17 (×2): 125 mg via ORAL
  Filled 2018-04-16 (×4): qty 2.5

## 2018-04-16 MED ORDER — SODIUM CHLORIDE 0.9 % IV SOLN
1.0000 g | Freq: Once | INTRAVENOUS | Status: AC
  Administered 2018-04-16: 1 g via INTRAVENOUS
  Filled 2018-04-16: qty 10

## 2018-04-16 MED ORDER — METOPROLOL TARTRATE 25 MG PO TABS
12.5000 mg | ORAL_TABLET | Freq: Two times a day (BID) | ORAL | Status: DC
  Administered 2018-04-16 – 2018-04-17 (×4): 12.5 mg via ORAL
  Filled 2018-04-16 (×4): qty 1

## 2018-04-16 MED ORDER — VITAMIN D 1000 UNITS PO TABS
1000.0000 [IU] | ORAL_TABLET | Freq: Every day | ORAL | Status: DC
  Administered 2018-04-16 – 2018-04-21 (×6): 1000 [IU] via ORAL
  Filled 2018-04-16 (×6): qty 1

## 2018-04-16 MED ORDER — CITALOPRAM HYDROBROMIDE 20 MG PO TABS
20.0000 mg | ORAL_TABLET | Freq: Every day | ORAL | Status: DC
  Administered 2018-04-16 – 2018-04-21 (×6): 20 mg via ORAL
  Filled 2018-04-16 (×6): qty 1

## 2018-04-16 MED ORDER — ENOXAPARIN SODIUM 40 MG/0.4ML ~~LOC~~ SOLN: 40.0000 mg | SUBCUTANEOUS | Status: DC

## 2018-04-16 MED ORDER — SODIUM CHLORIDE 0.9 % IV SOLN
500.0000 mg | INTRAVENOUS | Status: DC
  Administered 2018-04-16: 500 mg via INTRAVENOUS
  Filled 2018-04-16 (×2): qty 500

## 2018-04-16 MED ORDER — ALBUTEROL SULFATE (2.5 MG/3ML) 0.083% IN NEBU: 2.5000 mg | INHALATION_SOLUTION | Freq: Four times a day (QID) | RESPIRATORY_TRACT | Status: DC | PRN

## 2018-04-16 NOTE — Progress Notes (Signed)
PHARMACIST - PHYSICIAN COMMUNICATION  CONCERNING:  Enoxaparin (Lovenox) for DVT Prophylaxis    RECOMMENDATION: Patient was prescribed enoxaprin 40mg  q24 hours for VTE prophylaxis.   Filed Weights   04/16/18 1048  Weight: 150 lb (68 kg)    Body mass index is 24.21 kg/m.  Estimated Creatinine Clearance: 15 mL/min (A) (by C-G formula based on SCr of 3.23 mg/dL (H)).   Patient is candidate for enoxaparin 30mg  every 24 hours based on CrCl <52ml/min   DESCRIPTION: Pharmacy has adjusted enoxaparin dose.  Patient is now receiving enoxaparin 30mg  every 24 hours.  Pernell Dupre, PharmD, BCPS Clinical Pharmacist 04/16/2018 3:15 PM

## 2018-04-16 NOTE — H&P (Addendum)
[facility] [place] NAME: Jodi Beasley    MR#:  [id number]  DATE OF BIRTH:  03-02-1946  DATE OF ADMISSION:  04/16/2018  PRIMARY CARE PHYSICIAN: Leone Haven, MD   REQUESTING/REFERRING PHYSICIAN: dr Alfred Levins  CHIEF COMPLAINT:   Weakness and diarrhea HISTORY OF PRESENT ILLNESS:  Jodi Beasley  is a 72 y.o. female with a known history of COPD, CAD and depression who presents to the emergency room due to generalized weakness and diarrhea.  Patient was treated for sinus infection on doxycycline about a week ago.  Over the past 24 hours she has had diarrhea.  She denies fever or chills.  She denies abdominal pain.  She denies chest pain or shortness of breath. He does report she has generalized weakness and has had some falls since yesterday.  She denies neurological deficits.  She denies any urinary symptoms.  In the emergency room is noted that she has a pneumonia as well as a sodium level of 115.  PAST MEDICAL HISTORY:   Past Medical History:  Diagnosis Date  . Arrhythmia   . Chicken pox   . COPD (chronic obstructive pulmonary disease) ([facility])   . Coronary artery disease    NSTEMI in 09/2008. Cath : 80% RCA and 95% first diagonal. PCI and 2 DES placement  RCA and 1 DES to diagonal. Complicated by acute diagonal stent thrombosis . Treated by thrombectomy. Most recent nuclear stress test in 2010 showed no ischemia with normal EF.   [place] Kitchen Depression   . GERD (gastroesophageal reflux disease)   . Hay fever   . History of blood transfusion   . History of hypercholesterolemia   . History of syncope 2000   negative EP study  . Hyperlipidemia    intolerance to statins except Crestor.   . Hypertension   . Hypothyroid   . MI (myocardial infarction) ([place])   . Pneumonia, organism unspecified(486)     PAST SURGICAL HISTORY:   Past Surgical History:  Procedure Laterality Date  . CORONARY ANGIOPLASTY WITH STENT PLACEMENT  2009    [facility], drug-eluting mid RCA,prox diag;  . TONSILLECTOMY    . TUBAL LIGATION      SOCIAL HISTORY:   Social History   Tobacco Use  . Smoking status: Current Every Day Smoker    Packs/day: 1.00    Years: 53.00    Pack years: 53.00    Types: E-cigarettes, Cigarettes    Start date: 11/19/1960  . Smokeless tobacco: Never Used  . Tobacco comment: down to 0.75ppd  Substance Use Topics  . Alcohol use: Yes    Alcohol/week: 0.6 oz    Types: 1 Standard drinks or equivalent per week    FAMILY HISTORY:   Family History  Problem Relation Age of Onset  . Colon cancer Father   . Lung cancer Father        was a former smoker  . Cancer Father        lung  . Diabetes Father   . Hypertension Mother   . Hypertension Unknown   . Diabetes Unknown     DRUG ALLERGIES:   Allergies  Allergen Reactions  . Benzocaine     Tongue swelling  . Darvon [Propoxyphene Hcl]     HA  . Monosodium Glutamate Diarrhea  . Neosporin [Neomycin-Bacitracin Zn-Polymyx]     blisters  . Nicoderm [Nicotine]     arrythmia  . Prednisone     Cannot tolerate high doses per  patient- caused depressive thoughts    REVIEW OF SYSTEMS:   Review of Systems  Constitutional: Positive for malaise/fatigue. Negative for chills and fever.  HENT: Negative.  Negative for ear discharge, ear pain, hearing loss, nosebleeds and sore throat.   Eyes: Negative.  Negative for blurred vision and pain.  Respiratory: Negative.  Negative for cough, hemoptysis, shortness of breath and wheezing.   Cardiovascular: Negative.  Negative for chest pain, palpitations and leg swelling.  Gastrointestinal: Positive for diarrhea and nausea. Negative for abdominal pain, blood in stool and vomiting.  Genitourinary: Negative.  Negative for dysuria.  Musculoskeletal: Negative.  Negative for back pain.  Skin: Negative.   Neurological: Negative for dizziness, tremors, speech change, focal weakness, seizures and headaches.   Endo/Heme/Allergies: Negative.  Does not bruise/bleed easily.  Psychiatric/Behavioral: Negative.  Negative for depression, hallucinations and suicidal ideas.    MEDICATIONS AT HOME:   Prior to Admission medications   Medication Sig Start Date End Date Taking? Authorizing Provider  albuterol (PROAIR HFA) 108 (90 Base) MCG/ACT inhaler Inhale 1-2 puffs into the lungs every 6 (six) hours as needed for wheezing or shortness of breath. 06/27/17  Yes Juanito Doom, MD  aspirin 81 MG tablet Take 81 mg by mouth daily.   Yes [provider]  atorvastatin (LIPITOR) 80 MG tablet TAKE 1 TABLET(80 MG) BY MOUTH DAILY 04/09/18  Yes Leone Haven, MD  BREO ELLIPTA 100-25 MCG/INH AEPB INHALE 1 PUFF BY MOUTH EVERY DAY 02/07/18  Yes Juanito Doom, MD  buPROPion (WELLBUTRIN XL) 150 MG 24 hr tablet TAKE 1 TABLET(150 MG) BY MOUTH DAILY 03/27/18  Yes Leone Haven, MD  carisoprodol (SOMA) 350 MG tablet TAKE 1 TABLET BY MOUTH TWICE DAILY AS NEEDED FOR MUSCLE SPASMS 03/28/18  Yes Leone Haven, MD  Cholecalciferol (VITAMIN D-3) 1000 UNITS CAPS Take 1 capsule by mouth daily.   Yes [provider]  citalopram (CELEXA) 40 MG tablet TAKE 1 TABLET(40 MG) BY MOUTH DAILY 03/27/18  Yes Leone Haven, MD  Coenzyme Q10 (COQ10) 400 MG CAPS Take 400 mg by mouth daily.   Yes [provider]  fluticasone (FLONASE) 50 MCG/ACT nasal spray Place 2 sprays into both nostrils daily. 03/27/18  Yes Leone Haven, MD  levothyroxine (SYNTHROID, LEVOTHROID) 112 MCG tablet TAKE 1 TABLET BY MOUTH EVERY DAY BEFORE BREAKFAST 10/30/17  Yes Leone Haven, MD  metoprolol tartrate (LOPRESSOR) 100 MG tablet TAKE 1 TABLET(100 MG) BY MOUTH TWICE DAILY 04/09/18  Yes Leone Haven, MD  Multiple Vitamin (MULTIVITAMIN) capsule Take 1 capsule by mouth daily.   Yes [provider]  nitroGLYCERIN (NITROSTAT) 0.4 MG SL tablet Place 1 tablet (0.4 mg total) under the tongue every 5 (five)  minutes as needed. 01/10/17  Yes Croitoru, Mihai, MD  omeprazole (PRILOSEC) 20 MG capsule TAKE 1 CAPSULE BY MOUTH EVERY DAY 02/06/18  Yes Leone Haven, MD  vitamin B-12 (CYANOCOBALAMIN) 100 MCG tablet Take 100 mcg by mouth daily.   Yes [provider]  doxycycline (VIBRA-TABS) 100 MG tablet Take 1 tablet (100 mg total) by mouth 2 (two) times daily. Patient not taking: Reported on 04/16/2018 03/27/18   Leone Haven, MD      VITAL SIGNS:  Blood pressure (!) 131/97, pulse 96, temperature 98.2 F (36.8 C), temperature source Oral, resp. rate 18, height 5\' 6"  (1.676 m), weight 68 kg (150 lb), SpO2 95 %.  PHYSICAL EXAMINATION:   Physical Exam  Constitutional: She is oriented to person,  place, and time. No distress.  HENT:  Head: Normocephalic.  Eyes: No scleral icterus.  Neck: Normal range of motion. Neck supple. No JVD present. No tracheal deviation present.  Cardiovascular: Normal rate, regular rhythm and normal heart sounds. Exam reveals no gallop and no friction rub.  No murmur heard. Pulmonary/Chest: Effort normal and breath sounds normal. No respiratory distress. She has no wheezes. She has no rales. She exhibits no tenderness.  Abdominal: Soft. Bowel sounds are normal. She exhibits no distension and no mass. There is no tenderness. There is no rebound and no guarding.  Musculoskeletal: Normal range of motion. She exhibits no edema.  Neurological: She is alert and oriented to person, place, and time.  Skin: Skin is warm. No rash noted. No erythema.  Psychiatric: Judgment normal.      LABORATORY PANEL:   CBC Recent Labs  Lab 04/16/18 1055  WBC 30.2*  HGB 13.8  HCT 39.7  PLT 136*   ------------------------------------------------------------------------------------------------------------------  Chemistries  Recent Labs  Lab 04/16/18 1055  NA 115*  K 3.9  CL 83*  CO2 17*  GLUCOSE 148*  BUN 47*  CREATININE 3.23*  CALCIUM 8.4*  AST 44*  ALT 28   ALKPHOS 95  BILITOT 1.1   ------------------------------------------------------------------------------------------------------------------  Cardiac Enzymes No results for input(s): TROPONINI in the last 168 hours. ------------------------------------------------------------------------------------------------------------------  RADIOLOGY:  Dg Chest 2 View  Result Date: 04/16/2018 CLINICAL DATA:  Several recent falls.  Weakness and nausea EXAM: CHEST - 2 VIEW COMPARISON:  July 21, 2016 FINDINGS: There is no edema or consolidation. The heart size and pulmonary vascularity are normal. No adenopathy. There is aortic atherosclerosis. No pneumothorax. No evident bone lesions. IMPRESSION: Aortic atherosclerosis.  No edema or consolidation. Aortic Atherosclerosis (ICD10-I70.0). Electronically Signed   By: Lowella Grip III M.D.   On: 04/16/2018 12:01    EKG:  Normal sinus rhythm no ST elevation or depression  IMPRESSION AND PLAN:   72 year old female with history of COPD, CAD and essential hypertension who presents to the emergency room due to generalized weakness and diarrhea.  1.  Severe hyponatremia in the context of diarrhea Start IV fluids Sodium level every 6 hours Check TSH, urine sodium and uric acid Nephrology consultation requested  2.  Acute kidney injury in the setting of dehydration with diarrhea Continue IV fluids and repeat BMP in a.m. Hold nephrotoxic medications Nephrology consultation requested  3.  Community-acquired pneumonia with leukocytosis: Continue Rocephin and azithromycin Follow-up on blood cultures In a.m.  4.  CAD: Continue aspirin, statin, metoprolol  5.  Diarrhea: Check C. Difficile  6.  Hypothyroidism: Continue Synthroid  7.  COPD without signs of exacerbation  8.  Depression: Continue Celexa  9. Tobacco dependence: Patient is encouraged to quit smoking. Counseling was provided for 4 minutes. She is allergic to nicotine patch   All  the records are reviewed and case discussed with ED provider. Management plans discussed with the patient and she is in agreement  CODE STATUS: Full  Critical care TOTAL TIME TAKING CARE OF THIS PATIENT: 50 minutes.   Case discussed with intensivist Jolinda Pinkstaff M.D on 04/16/2018 at 12:41 PM  Between 7am to 6pm - Pager - [phone]  After 6pm go to [email or web address] - password [email or web address] [facility]  Office  [phone]  CC: Primary care physician; Leone Haven, MD

## 2018-04-16 NOTE — Progress Notes (Signed)
Pharmacy Antibiotic Note  Jodi Beasley is a 72 y.o. female admitted on 04/16/2018 with pneumonia/sepsis.  Pharmacy has been consulted for Ceftrixone dosing.  Patient treated with doxycycline for sinus infection ~ a week ago.   Plan: Patient received Ceftriaxone 1 gram IV x 1 in ER and Doxycycline 100mg  PO x 1 in ER. Will order 1 dose of Ceftriaxone 1 gram for a total dose of Ceftriaxone 2 gram today and continue with Ceftriaxone 2 gram for sepsis/PNA. Patient to continue on Azithromycin also.    Height: 5\' 6"  (167.6 cm) Weight: 150 lb (68 kg) IBW/kg (Calculated) : 59.3  Temp (24hrs), Avg:98.2 F (36.8 C), Min:98.2 F (36.8 C), Max:98.2 F (36.8 C)  Recent Labs  Lab 04/16/18 1055 04/16/18 1129  WBC 30.2*  --   CREATININE 3.23*  --   LATICACIDVEN  --  1.8    Estimated Creatinine Clearance: 15 mL/min (A) (by C-G formula based on SCr of 3.23 mg/dL (H)).    Allergies  Allergen Reactions  . Benzocaine     Tongue swelling  . Darvon [Propoxyphene Hcl]     HA  . Monosodium Glutamate Diarrhea  . Neosporin [Neomycin-Bacitracin Zn-Polymyx]     blisters  . Nicoderm [Nicotine]     arrythmia  . Prednisone     Cannot tolerate high doses per patient- caused depressive thoughts    Antimicrobials this admission: CTX 5/13 >>   Doxy 100 po x 1 5/13   >>  To start Azith    Dose adjustments this admission:    Microbiology results: 5/13 BCx: pending   UCx:      Sputum:      MRSA PCR:   5/13 Cdiff pending  Thank you for allowing pharmacy to be a part of this patient's care.  Rainey Rodger A 04/16/2018 1:06 PM

## 2018-04-16 NOTE — Consult Note (Signed)
[facility]  CONSULT NOTE    Date: 04/16/2018                  Patient Name:  Jodi Beasley  MRN: [id number]  DOB: 01/07/1946  Age / Sex: 72 y.o., female         PCP: Leone Haven, MD                 Service Requesting Consult: Dr. Benjie Karvonen                 Reason for Consult: Hyponatremia and acute renal failure            History of Present Illness: Jodi Beasley is a 72 y.o. white female with COPD, coronary artery disease, depression, GERD, hyperlipidemia, hypothyroidism, who was admitted to [facility] on 04/16/2018 for Diarrhea of presumed infectious origin [R19.7] Hyponatremia [E87.1] AKI (acute kidney injury) ([place]) [N17.9] Sepsis, due to unspecified organism ([place]) [A41.9] Leukocytosis, unspecified type [D72.829] Pneumonia due to infectious organism, unspecified laterality, unspecified part of lung [J18.9]  Nephew at bedside.   Patient was started on doxycycline on 4/23 for upper respiratory infection. She states her upper respiratory symptoms never went away. She then started to have diarrhea for last 2 days. She has had poor PO intake.   Nephrology consulted for acute renal failure, creatinine 3.23, baseline of 9.84 on 03/28/18 and severe hyponatremia at 115.   Started on IV normal saline   Medications: Outpatient medications: Medications Prior to Admission  Medication Sig Dispense Refill Last Dose  . albuterol (PROAIR HFA) 108 (90 Base) MCG/ACT inhaler Inhale 1-2 puffs into the lungs every 6 (six) hours as needed for wheezing or shortness of breath. 1 Inhaler 5 Taking  . aspirin 81 MG tablet Take 81 mg by mouth daily.   Taking  . atorvastatin (LIPITOR) 80 MG tablet TAKE 1 TABLET(80 MG) BY MOUTH DAILY 90 tablet 1   . BREO ELLIPTA 100-25 MCG/INH AEPB INHALE 1 PUFF BY MOUTH EVERY DAY 60 each 5 Taking  . buPROPion (WELLBUTRIN XL) 150 MG 24 hr tablet TAKE 1 TABLET(150 MG) BY MOUTH DAILY 90 tablet 1   . carisoprodol (SOMA) 350 MG tablet TAKE 1 TABLET  BY MOUTH TWICE DAILY AS NEEDED FOR MUSCLE SPASMS 60 tablet 0   . Cholecalciferol (VITAMIN D-3) 1000 UNITS CAPS Take 1 capsule by mouth daily.   Taking  . citalopram (CELEXA) 40 MG tablet TAKE 1 TABLET(40 MG) BY MOUTH DAILY 90 tablet 1   . Coenzyme Q10 (COQ10) 400 MG CAPS Take 400 mg by mouth daily.   Taking  . fluticasone (FLONASE) 50 MCG/ACT nasal spray Place 2 sprays into both nostrils daily. 16 g 6   . levothyroxine (SYNTHROID, LEVOTHROID) 112 MCG tablet TAKE 1 TABLET BY MOUTH EVERY DAY BEFORE BREAKFAST 90 tablet 2 Taking  . metoprolol tartrate (LOPRESSOR) 100 MG tablet TAKE 1 TABLET(100 MG) BY MOUTH TWICE DAILY 180 tablet 0   . Multiple Vitamin (MULTIVITAMIN) capsule Take 1 capsule by mouth daily.   Taking  . nitroGLYCERIN (NITROSTAT) 0.4 MG SL tablet Place 1 tablet (0.4 mg total) under the tongue every 5 (five) minutes as needed. 25 tablet 3 Taking  . omeprazole (PRILOSEC) 20 MG capsule TAKE 1 CAPSULE BY MOUTH EVERY DAY 90 capsule 1 Taking  . vitamin B-12 (CYANOCOBALAMIN) 100 MCG tablet Take 100 mcg by mouth daily.   Taking  . doxycycline (VIBRA-TABS) 100 MG tablet Take 1 tablet (100 mg total) by mouth  2 (two) times daily. (Patient not taking: Reported on 04/16/2018) 14 tablet 0 Not Taking at Unknown time    Current medications: Current Facility-Administered Medications  Medication Dose Route Frequency Provider Last Rate Last Dose  . 0.9 %  sodium chloride infusion   Intravenous Continuous Dhalia Zingaro, MD 75 mL/hr at 04/16/18 1454    . acetaminophen (TYLENOL) tablet 650 mg  650 mg Oral Q6H PRN Bettey Costa, MD       Or  . acetaminophen (TYLENOL) suppository 650 mg  650 mg Rectal Q6H PRN Mody, Sital, MD      . albuterol (PROVENTIL) (2.5 MG/3ML) 0.083% nebulizer solution 2.5 mg  2.5 mg Nebulization Q6H PRN Mody, Sital, MD      . aspirin chewable tablet 81 mg  81 mg Oral Daily Mody, Sital, MD   81 mg at 04/16/18 1501  . atorvastatin (LIPITOR) tablet 40 mg  40 mg Oral q1800 Mody, Sital, MD       . azithromycin (ZITHROMAX) 500 mg in sodium chloride 0.9 % 250 mL IVPB  500 mg Intravenous Q24H Mody, Sital, MD 250 mL/hr at 04/16/18 1512 500 mg at 04/16/18 1512  . buPROPion (WELLBUTRIN XL) 24 hr tablet 150 mg  150 mg Oral Daily Bettey Costa, MD   150 mg at 04/16/18 1502  . carisoprodol (SOMA) tablet 350 mg  350 mg Oral QHS Mody, Sital, MD      . cefTRIAXone (ROCEPHIN) 1 g in sodium chloride 0.9 % 100 mL IVPB  1 g Intravenous Once Bettey Costa, MD      . Derrill Memo ON 04/17/2018] cefTRIAXone (ROCEPHIN) 2 g in sodium chloride 0.9 % 100 mL IVPB  2 g Intravenous Q24H Mody, Sital, MD      . cholecalciferol (VITAMIN D) tablet 1,000 Units  1,000 Units Oral Daily Bettey Costa, MD   1,000 Units at 04/16/18 1501  . citalopram (CELEXA) tablet 20 mg  20 mg Oral Daily Mody, Sital, MD   20 mg at 04/16/18 1501  . enoxaparin (LOVENOX) injection 30 mg  30 mg Subcutaneous Q24H Hallaji, Sheema M, RPH      . fluticasone (FLONASE) 50 MCG/ACT nasal spray 2 spray  2 spray Each Nare Daily Mody, Sital, MD      . levothyroxine (SYNTHROID, LEVOTHROID) tablet 112 mcg  112 mcg Oral QAC breakfast Mody, Sital, MD      . metoprolol tartrate (LOPRESSOR) tablet 12.5 mg  12.5 mg Oral BID Benjie Karvonen, Sital, MD   12.5 mg at 04/16/18 1501  . multivitamin with minerals tablet 1 tablet  1 tablet Oral Daily Bettey Costa, MD   1 tablet at 04/16/18 1501  . ondansetron (ZOFRAN) tablet 4 mg  4 mg Oral Q6H PRN Mody, Sital, MD       Or  . ondansetron (ZOFRAN) injection 4 mg  4 mg Intravenous Q6H PRN Mody, Sital, MD      . pantoprazole (PROTONIX) EC tablet 40 mg  40 mg Oral Daily Mody, Sital, MD   40 mg at 04/16/18 1501  . polyethylene glycol (MIRALAX / GLYCOLAX) packet 17 g  17 g Oral Daily PRN Mody, Sital, MD      . vitamin B-12 (CYANOCOBALAMIN) tablet 100 mcg  100 mcg Oral Daily Bettey Costa, MD   100 mcg at 04/16/18 1501      Allergies: Allergies  Allergen Reactions  . Benzocaine     Tongue swelling  . Darvon [Propoxyphene Hcl]     HA  .  Monosodium Glutamate Diarrhea  .  Neosporin [Neomycin-Bacitracin Zn-Polymyx]     blisters  . Nicoderm [Nicotine]     arrythmia  . Prednisone     Cannot tolerate high doses per patient- caused depressive thoughts      Past Medical History: Past Medical History:  Diagnosis Date  . Arrhythmia   . Chicken pox   . COPD (chronic obstructive pulmonary disease) ([place])   . Coronary artery disease    NSTEMI in 09/2008. Cath : 80% RCA and 95% first diagonal. PCI and 2 DES placement  RCA and 1 DES to diagonal. Complicated by acute diagonal stent thrombosis . Treated by thrombectomy. Most recent nuclear stress test in 2010 showed no ischemia with normal EF.   Marland Kitchen Depression   . GERD (gastroesophageal reflux disease)   . Hay fever   . History of blood transfusion   . History of hypercholesterolemia   . History of syncope 2000   negative EP study  . Hyperlipidemia    intolerance to statins except Crestor.   . Hypertension   . Hypothyroid   . MI (myocardial infarction) ([place])   . Pneumonia, organism unspecified(486)      Past Surgical History: Past Surgical History:  Procedure Laterality Date  . CORONARY ANGIOPLASTY WITH STENT PLACEMENT  2009   [facility], drug-eluting mid RCA,prox diag;  . TONSILLECTOMY    . TUBAL LIGATION       Family History: Family History  Problem Relation Age of Onset  . Colon cancer Father   . Lung cancer Father        was a former smoker  . Cancer Father        lung  . Diabetes Father   . Hypertension Mother   . Hypertension Unknown   . Diabetes Unknown      Social History: Social History   Socioeconomic History  . Marital status: Widowed    Spouse name: Not on file  . Number of children: Not on file  . Years of education: Not on file  . Highest education level: Not on file  Occupational History  . Not on file  Social Needs  . Financial resource strain: Not hard at all  . Food insecurity:    Worry: Never true    Inability: Never true   . Transportation needs:    Medical: No    Non-medical: No  Tobacco Use  . Smoking status: Current Every Day Smoker    Packs/day: 1.00    Years: 53.00    Pack years: 53.00    Types: E-cigarettes, Cigarettes    Start date: 11/19/1960  . Smokeless tobacco: Never Used  . Tobacco comment: down to 0.75ppd  Substance and Sexual Activity  . Alcohol use: Yes    Alcohol/week: 0.6 oz    Types: 1 Standard drinks or equivalent per week  . Drug use: No  . Sexual activity: Yes  Lifestyle  . Physical activity:    Days per week: Not on file    Minutes per session: Not on file  . Stress: Not at all  Relationships  . Social connections:    Talks on phone: Not on file    Gets together: Not on file    Attends religious service: Not on file    Active member of club or organization: Not on file    Attends meetings of clubs or organizations: Not on file    Relationship status: Not on file  . Intimate partner violence:    Fear of current or ex partner: No  Emotionally abused: No    Physically abused: No    Forced sexual activity: No  Other Topics Concern  . Not on file  Social History Narrative   Lives with mother in [place]. 2 cats 2 dogs in home.   Recently moved from [facility].   Custody of 61month old.     Review of Systems: ROS  Vital Signs: Blood pressure (!) 147/84, pulse 92, temperature 98.2 F (36.8 C), temperature source Oral, resp. rate (!) 23, height 5\' 6"  (1.676 m), weight 68 kg (150 lb), SpO2 95 %.  Weight trends: Filed Weights   04/16/18 1048  Weight: 68 kg (150 lb)    Physical Exam: General: Laying in bed, ill appearing  Head: Normocephalic, atraumatic. Dry oral mucosal membranes  Eyes: Anicteric, PERRL  Neck: Supple, trachea midline  Lungs:  Clear to auscultation  Heart: Regular rate and rhythm  Abdomen:  Soft, nontender, obese  Extremities:  no peripheral edema.  Neurologic: Nonfocal, moving all four extremities  Skin: No lesions         Lab  results: Basic Metabolic Panel: Recent Labs  Lab 04/16/18 1055  NA 115*  K 3.9  CL 83*  CO2 17*  GLUCOSE 148*  BUN 47*  CREATININE 3.23*  CALCIUM 8.4*    Liver Function Tests: Recent Labs  Lab 04/16/18 1055  AST 44*  ALT 28  ALKPHOS 95  BILITOT 1.1  PROT 7.0  ALBUMIN 3.0*   Recent Labs  Lab 04/16/18 1055  LIPASE 21   No results for input(s): AMMONIA in the last 168 hours.  CBC: Recent Labs  Lab 04/16/18 1055  WBC 30.2*  NEUTROABS 27.6*  HGB 13.8  HCT 39.7  MCV 86.1  PLT 136*    Cardiac Enzymes: No results for input(s): CKTOTAL, CKMB, CKMBINDEX, TROPONINI in the last 168 hours.  BNP: Invalid input(s): POCBNP  CBG: Recent Labs  Lab 04/16/18 1410  GLUCAP 115*    Microbiology: No results found for this or any previous visit.  Coagulation Studies: No results for input(s): LABPROT, INR in the last 72 hours.  Urinalysis: No results for input(s): COLORURINE, LABSPEC, PHURINE, GLUCOSEU, HGBUR, BILIRUBINUR, KETONESUR, PROTEINUR, UROBILINOGEN, NITRITE, LEUKOCYTESUR in the last 72 hours.  Invalid input(s): APPERANCEUR    Imaging: Dg Chest 2 View  Result Date: 04/16/2018 CLINICAL DATA:  Several recent falls.  Weakness and nausea EXAM: CHEST - 2 VIEW COMPARISON:  July 21, 2016 FINDINGS: There is no edema or consolidation. The heart size and pulmonary vascularity are normal. No adenopathy. There is aortic atherosclerosis. No pneumothorax. No evident bone lesions. IMPRESSION: Aortic atherosclerosis.  No edema or consolidation. Aortic Atherosclerosis (ICD10-I70.0). Electronically Signed   By: Lowella Grip III M.D.   On: 04/16/2018 12:01      Assessment & Plan: Jodi Beasley is a 72 y.o. white female with COPD, coronary artery disease, depression, GERD, hyperlipidemia, hypothyroidism, who was admitted to [facility] on 04/16/2018  1. Acute Renal Failure 2. Acute renal failure 3. Metabolic Acidosis 4. Hypertension  Plan Hypovolemic hyponatremia  with prerenal azotemia.  - Start IV normal saline - Serial sodium checks.    LOS: 0 Jianni Shelden 5/13/20193:25 PM

## 2018-04-16 NOTE — ED Triage Notes (Signed)
Pt to ED via EMS from home with c/o dry heaves and weakness x3days. PT has also had increased falls. VSS. PT A&OX4.

## 2018-04-16 NOTE — ED Provider Notes (Signed)
[facility] Emergency Department Provider Note  ____________________________________________  Time seen: Approximately 12:09 PM  I have reviewed the triage vital signs and the nursing notes.   HISTORY  Chief Complaint Nausea and Weakness   HPI Jodi Beasley is a 72 y.o. female with a history of CAD, COPD, hypertension and hyperlipidemia who presents for evaluation of generalized weakness.  Patient reports 3 days of dry heaves, severe nausea, decreased p.o. intake, and diarrhea since yesterday.  She reports several episodes of watery diarrhea with no melena.  She denies vomiting.  She has had subjective fevers and chills at home.  She has a cough productive of yellow sputum.  She endorses mild constant shortness of breath.  No chest pain, no abdominal pain.  Patient recently finished a course of antibiotics 10 days ago for sinus infection.  She denies prior history of C. difficile.  She reports that she has felt severely weak since yesterday and has had several falls.  She denies any trauma from those falls.  She is not on blood thinners.   Past Medical History:  Diagnosis Date  . Arrhythmia   . Chicken pox   . COPD (chronic obstructive pulmonary disease) ([place])   . Coronary artery disease    NSTEMI in 09/2008. Cath : 80% RCA and 95% first diagonal. PCI and 2 DES placement  RCA and 1 DES to diagonal. Complicated by acute diagonal stent thrombosis . Treated by thrombectomy. Most recent nuclear stress test in 2010 showed no ischemia with normal EF.   Marland Kitchen Depression   . GERD (gastroesophageal reflux disease)   . Hay fever   . History of blood transfusion   . History of hypercholesterolemia   . History of syncope 2000   negative EP study  . Hyperlipidemia    intolerance to statins except Crestor.   . Hypertension   . Hypothyroid   . MI (myocardial infarction) ([place])   . Pneumonia, organism unspecified(486)     Patient Active Problem List   Diagnosis Date  Noted  . Problems influencing health status 12/27/2017  . Osteoarthritis of AC (acromioclavicular) joint (Right) 12/27/2017  . Grade 1 Anterolisthesis of L4 on L5 12/27/2017  . DDD (degenerative disc disease), lumbar 12/27/2017  . Lumbar spondylosis 12/27/2017  . Lumbar central spinal stenosis (L4-5) 12/27/2017  . Lumbar foraminal stenosis (L4-5) (Bilateral) 12/27/2017  . Lumbar lateral recess stenosis (L4-5) (Bilateral) 12/27/2017  . Osteoarthritis of lumbar facet joint (Bilateral) 12/27/2017  . Lumbar facet syndrome (Bilateral) (L>R) 12/27/2017  . Chronic low back pain (Primary Area of Pain) (Bilateral) (L>R) 12/12/2017  . Chronic lower extremity pain (Secondary Area of Pain) (Left) 12/12/2017  . Long term current use of opiate analgesic 12/12/2017  . Chronic pain syndrome 12/12/2017  . Disorder of skeletal system 12/12/2017  . Other long term (current) drug therapy 12/12/2017  . Other specified health status 12/12/2017  . Sinusitis 07/26/2017  . Rash 03/31/2017  . Fall 02/23/2017  . Rotator cuff impingement syndrome (Right) 09/26/2016  . Epidermal cyst 03/02/2016  . Hand pain 03/02/2016  . Hyponatremia 03/02/2016  . Hypercholesterolemia 10/21/2015  . Osteopenia 07/20/2015  . De Quervain's tenosynovitis (Right) 01/16/2015  . Pharmacologic therapy 11/19/2013  . Adjustment disorder with mixed anxiety and depressed mood 11/19/2013  . Hypothyroidism 01/24/2013  . Allergic rhinitis 01/21/2013  . Hypertension   . Coronary artery disease 04/19/2012  . Atrophic vaginitis 04/19/2012  . COPD (chronic obstructive pulmonary disease) with chronic bronchitis ([place]) 04/09/2012  . Tobacco abuse  04/09/2012    Past Surgical History:  Procedure Laterality Date  . CORONARY ANGIOPLASTY WITH STENT PLACEMENT  2009   [facility], drug-eluting mid RCA,prox diag;  . TONSILLECTOMY    . TUBAL LIGATION      Prior to Admission medications   Medication Sig Start Date End Date Taking? Authorizing  Provider  albuterol (PROAIR HFA) 108 (90 Base) MCG/ACT inhaler Inhale 1-2 puffs into the lungs every 6 (six) hours as needed for wheezing or shortness of breath. 06/27/17  Yes Juanito Doom, MD  aspirin 81 MG tablet Take 81 mg by mouth daily.   Yes [provider]  atorvastatin (LIPITOR) 80 MG tablet TAKE 1 TABLET(80 MG) BY MOUTH DAILY 04/09/18  Yes Leone Haven, MD  BREO ELLIPTA 100-25 MCG/INH AEPB INHALE 1 PUFF BY MOUTH EVERY DAY 02/07/18  Yes Juanito Doom, MD  buPROPion (WELLBUTRIN XL) 150 MG 24 hr tablet TAKE 1 TABLET(150 MG) BY MOUTH DAILY 03/27/18  Yes Leone Haven, MD  carisoprodol (SOMA) 350 MG tablet TAKE 1 TABLET BY MOUTH TWICE DAILY AS NEEDED FOR MUSCLE SPASMS 03/28/18  Yes Leone Haven, MD  Cholecalciferol (VITAMIN D-3) 1000 UNITS CAPS Take 1 capsule by mouth daily.   Yes [provider]  citalopram (CELEXA) 40 MG tablet TAKE 1 TABLET(40 MG) BY MOUTH DAILY 03/27/18  Yes Leone Haven, MD  Coenzyme Q10 (COQ10) 400 MG CAPS Take 400 mg by mouth daily.   Yes [provider]  fluticasone (FLONASE) 50 MCG/ACT nasal spray Place 2 sprays into both nostrils daily. 03/27/18  Yes Leone Haven, MD  levothyroxine (SYNTHROID, LEVOTHROID) 112 MCG tablet TAKE 1 TABLET BY MOUTH EVERY DAY BEFORE BREAKFAST 10/30/17  Yes Leone Haven, MD  metoprolol tartrate (LOPRESSOR) 100 MG tablet TAKE 1 TABLET(100 MG) BY MOUTH TWICE DAILY 04/09/18  Yes Leone Haven, MD  Multiple Vitamin (MULTIVITAMIN) capsule Take 1 capsule by mouth daily.   Yes [provider]  nitroGLYCERIN (NITROSTAT) 0.4 MG SL tablet Place 1 tablet (0.4 mg total) under the tongue every 5 (five) minutes as needed. 01/10/17  Yes Croitoru, Mihai, MD  omeprazole (PRILOSEC) 20 MG capsule TAKE 1 CAPSULE BY MOUTH EVERY DAY 02/06/18  Yes Leone Haven, MD  vitamin B-12 (CYANOCOBALAMIN) 100 MCG tablet Take 100 mcg by mouth daily.   Yes [provider]  doxycycline  (VIBRA-TABS) 100 MG tablet Take 1 tablet (100 mg total) by mouth 2 (two) times daily. Patient not taking: Reported on 04/16/2018 03/27/18   Leone Haven, MD    Allergies Benzocaine; Darvon [propoxyphene hcl]; Monosodium glutamate; Neosporin [neomycin-bacitracin zn-polymyx]; Nicoderm [nicotine]; and Prednisone  Family History  Problem Relation Age of Onset  . Colon cancer Father   . Lung cancer Father        was a former smoker  . Cancer Father        lung  . Diabetes Father   . Hypertension Mother   . Hypertension Unknown   . Diabetes Unknown     Social History Social History   Tobacco Use  . Smoking status: Current Every Day Smoker    Packs/day: 1.00    Years: 53.00    Pack years: 53.00    Types: E-cigarettes, Cigarettes    Start date: 11/19/1960  . Smokeless tobacco: Never Used  . Tobacco comment: down to 0.75ppd  Substance Use Topics  . Alcohol use: Yes    Alcohol/week: 0.6 oz    Types: 1 Standard drinks or equivalent  per week  . Drug use: No    Review of Systems  Constitutional: + fever, chills, weakness Eyes: Negative for visual changes. ENT: Negative for sore throat. Neck: No neck pain  Cardiovascular: Negative for chest pain. Respiratory: + shortness of breath, cough Gastrointestinal: Negative for abdominal pain, vomiting. + nausea, and diarrhea. Genitourinary: Negative for dysuria. Musculoskeletal: Negative for back pain. Skin: Negative for rash. Neurological: Negative for headaches, weakness or numbness. Psych: No SI or HI  ____________________________________________   PHYSICAL EXAM:  VITAL SIGNS: ED Triage Vitals  Enc Vitals Group     BP 04/16/18 1049 (!) 131/97     Pulse Rate 04/16/18 1049 96     Resp 04/16/18 1049 18     Temp 04/16/18 1049 98.2 F (36.8 C)     Temp Source 04/16/18 1049 Oral     SpO2 04/16/18 1049 95 %     Weight 04/16/18 1048 150 lb (68 kg)     Height 04/16/18 1048 5\' 6"  (1.676 m)     Head Circumference --       Peak Flow --      Pain Score 04/16/18 1046 0     Pain Loc --      Pain Edu? --      Excl. in [place]? --     Constitutional: Alert and oriented. Well appearing and in no apparent distress. HEENT:      Head: Normocephalic and atraumatic.         Eyes: Conjunctivae are normal. Sclera is non-icteric.       Mouth/Throat: Mucous membranes are dry.       Neck: Supple with no signs of meningismus. Cardiovascular: Regular rate and rhythm. No murmurs, gallops, or rubs. 2+ symmetrical distal pulses are present in all extremities. No JVD. Respiratory: Normal respiratory effort. Lungs are clear to auscultation bilaterally. No wheezes, crackles, or rhonchi.  Gastrointestinal: Soft, non tender, and non distended with positive bowel sounds. No rebound or guarding. Genitourinary: No CVA tenderness. Musculoskeletal: Nontender with normal range of motion in all extremities. No edema, cyanosis, or erythema of extremities. Neurologic: Normal speech and language. Face is symmetric. Moving all extremities. No gross focal neurologic deficits are appreciated. Skin: Skin is warm, dry and intact. No rash noted. Psychiatric: Mood and affect are normal. Speech and behavior are normal.  ____________________________________________   LABS (all labs ordered are listed, but only abnormal results are displayed)  Labs Reviewed  COMPREHENSIVE METABOLIC PANEL - Abnormal; Notable for the following components:      Result Value   Sodium 115 (*)    Chloride 83 (*)    CO2 17 (*)    Glucose, Bld 148 (*)    BUN 47 (*)    Creatinine, Ser 3.23 (*)    Calcium 8.4 (*)    Albumin 3.0 (*)    AST 44 (*)    GFR calc non Af Amer 13 (*)    GFR calc Af Amer 16 (*)    All other components within normal limits  CBC - Abnormal; Notable for the following components:   WBC 30.2 (*)    Platelets 136 (*)    All other components within normal limits  DIFFERENTIAL - Abnormal; Notable for the following components:   Neutro Abs 27.6 (*)     Lymphs Abs 0.6 (*)    Monocytes Absolute 1.5 (*)    All other components within normal limits  C DIFFICILE QUICK SCREEN W PCR REFLEX  CULTURE, BLOOD (ROUTINE X  2)  CULTURE, BLOOD (ROUTINE X 2)  LIPASE, BLOOD  URINALYSIS, COMPLETE (UACMP) WITH MICROSCOPIC  LACTIC ACID, PLASMA  URINALYSIS, ROUTINE W REFLEX MICROSCOPIC   ____________________________________________  EKG  ED ECG REPORT I, Rudene Re, the attending physician, personally viewed and interpreted this ECG.  Normal sinus rhythm, rate of 95, normal intervals, normal axis, occasional PVCs, no ST elevations or depressions. ____________________________________________  RADIOLOGY  I have personally reviewed the images performed during this visit and I agree with the Radiologist's read.   Interpretation by Radiologist:  Dg Chest 2 View  Result Date: 04/16/2018 CLINICAL DATA:  Several recent falls.  Weakness and nausea EXAM: CHEST - 2 VIEW COMPARISON:  July 21, 2016 FINDINGS: There is no edema or consolidation. The heart size and pulmonary vascularity are normal. No adenopathy. There is aortic atherosclerosis. No pneumothorax. No evident bone lesions. IMPRESSION: Aortic atherosclerosis.  No edema or consolidation. Aortic Atherosclerosis (ICD10-I70.0). Electronically Signed   By: Lowella Grip III M.D.   On: 04/16/2018 12:01   ____________________________________________   PROCEDURES  Procedure(s) performed: None Procedures Critical Care performed: yes  CRITICAL CARE Performed by: Rudene Re  ?  Total critical care time: 35 min  Critical care time was exclusive of separately billable procedures and treating other patients.  Critical care was necessary to treat or prevent imminent or life-threatening deterioration.  Critical care was time spent personally by me on the following activities: development of treatment plan with patient and/or surrogate as well as nursing, discussions with  consultants, evaluation of patient's response to treatment, examination of patient, obtaining history from patient or surrogate, ordering and performing treatments and interventions, ordering and review of laboratory studies, ordering and review of radiographic studies, pulse oximetry and re-evaluation of patient's condition.  ____________________________________________   INITIAL IMPRESSION / ASSESSMENT AND PLAN / ED COURSE  72 y.o. female with a history of CAD, COPD, hypertension and hyperlipidemia who presents for evaluation of generalized weakness, cough, shortness of breath, dry heaves, nausea, diarrhea, fever and chills.  Patient looks dry on exam but is in no distress, lungs are clear to auscultation, abdomen is soft and nontender.  EKG shows no evidence of ischemia.  Labs concerning for acute kidney injury with a new creatinine of 3.23.  Also new hyponatremia with a sodium of 115.  Patient has a white count of 30K.  Chest x-ray does not show an infiltrate however presentation is concerning for atypical pneumonia possibly Legionella.  Urinalysis is pending.  Patient was given Rocephin and doxycycline, IV fluids and she is going to be admitted to the hospitalist service.      As part of my medical decision making, I reviewed the following data within the [place] notes reviewed and incorporated, Labs reviewed , EKG interpreted , Old chart reviewed, Radiograph reviewed , Discussed with admitting physician , Notes from prior ED visits and [place] Controlled Substance Database    Pertinent labs & imaging results that were available during my care of the patient were reviewed by me and considered in my medical decision making (see chart for details).    ____________________________________________   FINAL CLINICAL IMPRESSION(S) / ED DIAGNOSES  Final diagnoses:  Hyponatremia  AKI (acute kidney injury) ([place])  Leukocytosis, unspecified type  Sepsis, due to  unspecified organism ([place])  Pneumonia due to infectious organism, unspecified laterality, unspecified part of lung  Diarrhea of presumed infectious origin      NEW MEDICATIONS STARTED DURING THIS VISIT:  ED Discharge Orders  None       Note:  This document was prepared using Dragon voice recognition software and may include unintentional dictation errors.    Alfred Levins, [place], MD 04/16/18 1213

## 2018-04-16 NOTE — Progress Notes (Signed)
CODE SEPSIS - PHARMACY COMMUNICATION  **Broad Spectrum Antibiotics should be administered within 1 hour of Sepsis diagnosis**  Time Code Sepsis Called/Page Received: 5/13 1209  Antibiotics Ordered: CTX/Doxy  Time of 1st antibiotic administration: 1243  Additional action taken by pharmacy:    If necessary, Name of Provider/Nurse Contacted:      Noralee Space ,PharmD Clinical Pharmacist  04/16/2018  1:05 PM

## 2018-04-16 NOTE — ED Notes (Signed)
Pt up to bathroom at this time, unable to provide urine samp

## 2018-04-16 NOTE — Consult Note (Signed)
[facility] Consultation      Name: Jodi Beasley MRN: [id number] DOB: 17-Mar-1946    ADMISSION DATE:  04/16/2018 CONSULTATION DATE:  04/16/2018  REFERRING MD :  Dr. Benjie Karvonen   CHIEF COMPLAINT:     Weakness and diarhoea   HISTORY OF PRESENT ILLNESS    Jodi Beasley  is a 72 y.o. female with a known history of COPD, CAD and depression who presents to the emergency room due to generalized weakness and diarrhea.  Patient was treated for sinus infection on doxycycline about a week ago.  Over the past 24 hours she has had diarrhea.  She denies fever or chills.  She denies abdominal pain.  She denies chest pain or shortness of breath. He does report she has generalized weakness and has had some falls since yesterday.  She denies neurological deficits.  She denies any dysuria. She has not traveled nor eaten out at a restaurant in the last 2 weeks. She reported that she had been drinking about 4-5 liters of water per day for the past 3 days.  She was found to be hyponatremic with sodium level of 115.  Hence she was admitted to the ICU  For further management.    SIGNIFICANT EVENTS   5/13 Admited    PAST MEDICAL HISTORY    Past Medical History:  Diagnosis Date  . Arrhythmia   . Chicken pox   . COPD (chronic obstructive pulmonary disease) ([place])   . Coronary artery disease    NSTEMI in 09/2008. Cath : 80% RCA and 95% first diagonal. PCI and 2 DES placement  RCA and 1 DES to diagonal. Complicated by acute diagonal stent thrombosis . Treated by thrombectomy. Most recent nuclear stress test in 2010 showed no ischemia with normal EF.   Marland Kitchen Depression   . GERD (gastroesophageal reflux disease)   . Hay fever   . History of blood transfusion   . History of hypercholesterolemia   . History of syncope 2000   negative EP study  . Hyperlipidemia    intolerance to statins except Crestor.   . Hypertension   . Hypothyroid   . MI (myocardial infarction) ([place])   . Pneumonia,  organism unspecified(486)    Past Surgical History:  Procedure Laterality Date  . CORONARY ANGIOPLASTY WITH STENT PLACEMENT  2009   [facility], drug-eluting mid RCA,prox diag;  . TONSILLECTOMY    . TUBAL LIGATION     Prior to Admission medications   Medication Sig Start Date End Date Taking? Authorizing Provider  albuterol (PROAIR HFA) 108 (90 Base) MCG/ACT inhaler Inhale 1-2 puffs into the lungs every 6 (six) hours as needed for wheezing or shortness of breath. 06/27/17  Yes Juanito Doom, MD  aspirin 81 MG tablet Take 81 mg by mouth daily.   Yes [provider]  atorvastatin (LIPITOR) 80 MG tablet TAKE 1 TABLET(80 MG) BY MOUTH DAILY 04/09/18  Yes Leone Haven, MD  BREO ELLIPTA 100-25 MCG/INH AEPB INHALE 1 PUFF BY MOUTH EVERY DAY 02/07/18  Yes Juanito Doom, MD  buPROPion (WELLBUTRIN XL) 150 MG 24 hr tablet TAKE 1 TABLET(150 MG) BY MOUTH DAILY 03/27/18  Yes Leone Haven, MD  carisoprodol (SOMA) 350 MG tablet TAKE 1 TABLET BY MOUTH TWICE DAILY AS NEEDED FOR MUSCLE SPASMS 03/28/18  Yes Leone Haven, MD  Cholecalciferol (VITAMIN D-3) 1000 UNITS CAPS Take 1 capsule by mouth daily.   Yes [provider]  citalopram (CELEXA) 40 MG tablet TAKE 1 TABLET(40  MG) BY MOUTH DAILY 03/27/18  Yes Leone Haven, MD  Coenzyme Q10 (COQ10) 400 MG CAPS Take 400 mg by mouth daily.   Yes [provider]  fluticasone (FLONASE) 50 MCG/ACT nasal spray Place 2 sprays into both nostrils daily. 03/27/18  Yes Leone Haven, MD  levothyroxine (SYNTHROID, LEVOTHROID) 112 MCG tablet TAKE 1 TABLET BY MOUTH EVERY DAY BEFORE BREAKFAST 10/30/17  Yes Leone Haven, MD  metoprolol tartrate (LOPRESSOR) 100 MG tablet TAKE 1 TABLET(100 MG) BY MOUTH TWICE DAILY 04/09/18  Yes Leone Haven, MD  Multiple Vitamin (MULTIVITAMIN) capsule Take 1 capsule by mouth daily.   Yes [provider]  nitroGLYCERIN (NITROSTAT) 0.4 MG SL tablet Place 1 tablet (0.4 mg total)  under the tongue every 5 (five) minutes as needed. 01/10/17  Yes Croitoru, Mihai, MD  omeprazole (PRILOSEC) 20 MG capsule TAKE 1 CAPSULE BY MOUTH EVERY DAY 02/06/18  Yes Leone Haven, MD  vitamin B-12 (CYANOCOBALAMIN) 100 MCG tablet Take 100 mcg by mouth daily.   Yes [provider]  doxycycline (VIBRA-TABS) 100 MG tablet Take 1 tablet (100 mg total) by mouth 2 (two) times daily. Patient not taking: Reported on 04/16/2018 03/27/18   Leone Haven, MD   Allergies  Allergen Reactions  . Benzocaine     Tongue swelling  . Darvon [Propoxyphene Hcl]     HA  . Monosodium Glutamate Diarrhea  . Neosporin [Neomycin-Bacitracin Zn-Polymyx]     blisters  . Nicoderm [Nicotine]     arrythmia  . Prednisone     Cannot tolerate high doses per patient- caused depressive thoughts     FAMILY HISTORY   Family History  Problem Relation Age of Onset  . Colon cancer Father   . Lung cancer Father        was a former smoker  . Cancer Father        lung  . Diabetes Father   . Hypertension Mother   . Hypertension Unknown   . Diabetes Unknown       SOCIAL HISTORY    reports that she has been smoking e-cigarettes and cigarettes.  She started smoking about 57 years ago. She has a 53.00 pack-year smoking history. She has never used smokeless tobacco. She reports that she drinks about 0.6 oz of alcohol per week. She reports that she does not use drugs.  ROS Review of Systems  Constitutional: Positive for malaise/fatigue. Negative for chills and fever.  HENT: Negative.  Negative for ear discharge, ear pain, hearing loss, nosebleeds and sore throat.   Eyes: Negative.  Negative for blurred vision and pain.  Respiratory: Negative.  Negative for cough, hemoptysis, shortness of breath and wheezing.   Cardiovascular: Negative.  Negative for chest pain, palpitations and leg swelling.  Gastrointestinal: Positive for diarrhea and nausea. Negative for abdominal pain, blood in stool and vomiting.    Genitourinary: Negative.  Negative for dysuria.  Musculoskeletal: Negative.  Negative for back pain.  Skin: Negative.   Neurological: Negative for dizziness, tremors, speech change, focal weakness, seizures and headaches.  Endo/Heme/Allergies: Negative.  Does not bruise/bleed easily.  Psychiatric/Behavioral: Negative.  Negative for depression, hallucinations and suicidal ideas.       VITAL SIGNS    Temp:  [98.2 F (36.8 C)] 98.2 F (36.8 C) (05/13 1500) Pulse Rate:  [92-96] 92 (05/13 1500) Resp:  [15-23] 23 (05/13 1500) BP: (131-147)/(83-97) 147/84 (05/13 1500) SpO2:  [95 %-98 %] 95 % (05/13 1500) Weight:  [150 lb (68 kg)-159  lb 6.3 oz (72.3 kg)] 159 lb 6.3 oz (72.3 kg) (05/13 1500) HEMODYNAMICS:  no compromise  Oxygen: Room Air   INTAKE / OUTPUT:  Intake/Output Summary (Last 24 hours) at 04/16/2018 1722 Last data filed at 04/16/2018 1638 Gross per 24 hour  Intake 425 ml  Output -  Net 425 ml       PHYSICAL EXAM   Physical Exam  Constitutional: She is oriented to person, place, and time. No distress.  HENT:  Head: Normocephalic.  Eyes: No scleral icterus.  Neck: Normal range of motion. Neck supple. No JVD present. No tracheal deviation present.  Cardiovascular: Normal rate, regular rhythm and normal heart sounds. Exam reveals no gallop and no friction rub.  No murmur heard. Pulmonary: Effort normal and breath sounds normal. No respiratory distress. She has no wheezes. She has no rales. She exhibits no tenderness.  Abdominal: Soft. Bowel sounds are normal. She exhibits no distension and no mass. There is no tenderness. There is no rebound and no guarding.  Musculoskeletal: Normal range of motion. She exhibits no edema.  Neurological: She is alert and oriented to person, place, and time.  Skin: Skin is warm. No rash noted. No erythema.  Psychiatric: Judgment normal.      LABS   LABS:  CBC Recent Labs  Lab 04/16/18 1055  WBC 30.2*  HGB 13.8  HCT  39.7  PLT 136*   Coag's No results for input(s): APTT, INR in the last 168 hours. BMET Recent Labs  Lab 04/16/18 1055 04/16/18 1506  NA 115* 119*  K 3.9  --   CL 83*  --   CO2 17*  --   BUN 47*  --   CREATININE 3.23*  --   GLUCOSE 148*  --    Electrolytes Recent Labs  Lab 04/16/18 1055  CALCIUM 8.4*   Sepsis Markers Recent Labs  Lab 04/16/18 1129  LATICACIDVEN 1.8   ABG No results for input(s): PHART, PCO2ART, PO2ART in the last 168 hours. Liver Enzymes Recent Labs  Lab 04/16/18 1055  AST 44*  ALT 28  ALKPHOS 95  BILITOT 1.1  ALBUMIN 3.0*   Cardiac Enzymes No results for input(s): TROPONINI, PROBNP in the last 168 hours. Glucose Recent Labs  Lab 04/16/18 1410  GLUCAP 115*     No results found for this or any previous visit (from the past 240 hour(s)).   Current Facility-Administered Medications:  .  0.9 %  sodium chloride infusion, , Intravenous, Continuous, Kolluru, Sarath, MD, Last Rate: 75 mL/hr at 04/16/18 1454 .  acetaminophen (TYLENOL) tablet 650 mg, 650 mg, Oral, Q6H PRN **OR** acetaminophen (TYLENOL) suppository 650 mg, 650 mg, Rectal, Q6H PRN, Mody, Sital, MD .  albuterol (PROVENTIL) (2.5 MG/3ML) 0.083% nebulizer solution 2.5 mg, 2.5 mg, Nebulization, Q6H PRN, Mody, Sital, MD .  aspirin chewable tablet 81 mg, 81 mg, Oral, Daily, Mody, Sital, MD, 81 mg at 04/16/18 1501 .  atorvastatin (LIPITOR) tablet 40 mg, 40 mg, Oral, q1800, Mody, Sital, MD .  azithromycin (ZITHROMAX) 500 mg in sodium chloride 0.9 % 250 mL IVPB, 500 mg, Intravenous, Q24H, Mody, Sital, MD, Last Rate: 250 mL/hr at 04/16/18 1512, 500 mg at 04/16/18 1512 .  buPROPion (WELLBUTRIN XL) 24 hr tablet 150 mg, 150 mg, Oral, Daily, Mody, Sital, MD, 150 mg at 04/16/18 1502 .  carisoprodol (SOMA) tablet 350 mg, 350 mg, Oral, QHS, Mody, Sital, MD .  Derrill Memo ON 04/17/2018] cefTRIAXone (ROCEPHIN) 2 g in sodium chloride 0.9 % 100 mL IVPB,  2 g, Intravenous, Q24H, Mody, Sital, MD .   cholecalciferol (VITAMIN D) tablet 1,000 Units, 1,000 Units, Oral, Daily, Mody, Sital, MD, 1,000 Units at 04/16/18 1501 .  citalopram (CELEXA) tablet 20 mg, 20 mg, Oral, Daily, Mody, Sital, MD, 20 mg at 04/16/18 1501 .  enoxaparin (LOVENOX) injection 30 mg, 30 mg, Subcutaneous, Q24H, Hallaji, Sheema M, RPH .  fluticasone (FLONASE) 50 MCG/ACT nasal spray 2 spray, 2 spray, Each Nare, Daily, Mody, Sital, MD .  levothyroxine (SYNTHROID, LEVOTHROID) tablet 112 mcg, 112 mcg, Oral, QAC breakfast, Mody, Sital, MD .  metoprolol tartrate (LOPRESSOR) tablet 12.5 mg, 12.5 mg, Oral, BID, Mody, Sital, MD, 12.5 mg at 04/16/18 1501 .  multivitamin with minerals tablet 1 tablet, 1 tablet, Oral, Daily, Mody, Sital, MD, 1 tablet at 04/16/18 1501 .  ondansetron (ZOFRAN) tablet 4 mg, 4 mg, Oral, Q6H PRN **OR** ondansetron (ZOFRAN) injection 4 mg, 4 mg, Intravenous, Q6H PRN, Mody, Sital, MD .  pantoprazole (PROTONIX) EC tablet 40 mg, 40 mg, Oral, Daily, Mody, Sital, MD, 40 mg at 04/16/18 1501 .  polyethylene glycol (MIRALAX / GLYCOLAX) packet 17 g, 17 g, Oral, Daily PRN, Mody, Sital, MD .  vitamin B-12 (CYANOCOBALAMIN) tablet 100 mcg, 100 mcg, Oral, Daily, Mody, Sital, MD, 100 mcg at 04/16/18 1501  IMAGING    Dg Chest 2 View  Result Date: 04/16/2018 CLINICAL DATA:  Several recent falls.  Weakness and nausea EXAM: CHEST - 2 VIEW COMPARISON:  July 21, 2016 FINDINGS: There is no edema or consolidation. The heart size and pulmonary vascularity are normal. No adenopathy. There is aortic atherosclerosis. No pneumothorax. No evident bone lesions. IMPRESSION: Aortic atherosclerosis.  No edema or consolidation. Aortic Atherosclerosis (ICD10-I70.0). Electronically Signed   By: Lowella Grip III M.D.   On: 04/16/2018 12:01                    MAJOR EVENTS/TEST RESULTS:   INDWELLING DEVICES:: Foley  MICRO DATA: MRSA PCR pending Urine pending   ANTIMICROBIALS:  5/13 Vancomycin   ASSESSMENT/PLAN    1.  Severe hyponatremia likely from excessive free water intake Plan: Monitor Na+ levels Q 4 hours.  Avoid rapid correction- no more than .61meq/hr. Management as per nephrology team  2.  Acute renal failure non oliguric likely prerenal from severe dehydration from insensible loss Plan: Rehydration as per Nephrology team, avoid Nephrotoxic meds, renally dose all meds.  3. Infectious dirrhoea- Presumed C-diff colitis The very elevated WBC with diarrhea is highly suspicious for C-Diff Plan: send off sputum culture, pan culture for any pyrexia, isolate pending culture, start empirical abx, early de-escalation of ABX.   4. Hx of Hypothyroidism Plan: check TSH levels and cortisol levels.  5.  COPD without signs of exacerbation  Misc: GI and DVT prophylaxis  Patient and son updated on goals of therapy.  Thank you for allowing me the privilege to care for her.   Cammie Sickle, M.D

## 2018-04-17 ENCOUNTER — Inpatient Hospital Stay: Payer: Medicare Other

## 2018-04-17 DIAGNOSIS — R7881 Bacteremia: Secondary | ICD-10-CM

## 2018-04-17 DIAGNOSIS — E871 Hypo-osmolality and hyponatremia: Secondary | ICD-10-CM

## 2018-04-17 LAB — CBC
HCT: 33.7 % — ABNORMAL LOW (ref 35.0–47.0)
HEMOGLOBIN: 11.4 g/dL — AB (ref 12.0–16.0)
MCH: 30 pg (ref 26.0–34.0)
MCHC: 33.9 g/dL (ref 32.0–36.0)
MCV: 88.4 fL (ref 80.0–100.0)
Platelets: 120 10*3/uL — ABNORMAL LOW (ref 150–440)
RBC: 3.81 MIL/uL (ref 3.80–5.20)
RDW: 14.8 % — ABNORMAL HIGH (ref 11.5–14.5)
WBC: 21.2 10*3/uL — AB (ref 3.6–11.0)

## 2018-04-17 LAB — BLOOD CULTURE ID PANEL (REFLEXED)
Acinetobacter baumannii: NOT DETECTED
CANDIDA ALBICANS: NOT DETECTED
CANDIDA KRUSEI: NOT DETECTED
CANDIDA PARAPSILOSIS: NOT DETECTED
CANDIDA TROPICALIS: NOT DETECTED
Candida glabrata: NOT DETECTED
Carbapenem resistance: NOT DETECTED
Enterobacter cloacae complex: NOT DETECTED
Enterobacteriaceae species: DETECTED — AB
Enterococcus species: NOT DETECTED
Escherichia coli: DETECTED — AB
Haemophilus influenzae: NOT DETECTED
KLEBSIELLA PNEUMONIAE: NOT DETECTED
Klebsiella oxytoca: NOT DETECTED
Listeria monocytogenes: NOT DETECTED
Neisseria meningitidis: NOT DETECTED
PROTEUS SPECIES: NOT DETECTED
Pseudomonas aeruginosa: NOT DETECTED
SERRATIA MARCESCENS: NOT DETECTED
STAPHYLOCOCCUS SPECIES: NOT DETECTED
Staphylococcus aureus (BCID): NOT DETECTED
Streptococcus agalactiae: NOT DETECTED
Streptococcus pneumoniae: NOT DETECTED
Streptococcus pyogenes: NOT DETECTED
Streptococcus species: NOT DETECTED

## 2018-04-17 LAB — BASIC METABOLIC PANEL
ANION GAP: 11 (ref 5–15)
BUN: 50 mg/dL — ABNORMAL HIGH (ref 6–20)
CALCIUM: 7.6 mg/dL — AB (ref 8.9–10.3)
CHLORIDE: 89 mmol/L — AB (ref 101–111)
CO2: 19 mmol/L — ABNORMAL LOW (ref 22–32)
CREATININE: 3.83 mg/dL — AB (ref 0.44–1.00)
GFR calc non Af Amer: 11 mL/min — ABNORMAL LOW (ref >60)
GFR, EST AFRICAN AMERICAN: 13 mL/min — AB (ref >60)
Glucose, Bld: 93 mg/dL (ref 65–99)
Potassium: 3.7 mmol/L (ref 3.5–5.1)
SODIUM: 119 mmol/L — AB (ref 135–145)

## 2018-04-17 LAB — MRSA PCR SCREENING: MRSA BY PCR: NEGATIVE

## 2018-04-17 LAB — SODIUM
SODIUM: 119 mmol/L — AB (ref 135–145)
SODIUM: 116 mmol/L — AB (ref 135–145)
Sodium: 116 mmol/L — CL (ref 135–145)
Sodium: 117 mmol/L — CL (ref 135–145)
Sodium: 118 mmol/L — CL (ref 135–145)

## 2018-04-17 LAB — SODIUM, URINE, RANDOM: Sodium, Ur: <10 mmol/L

## 2018-04-17 MED ORDER — SODIUM CHLORIDE 0.9 % IV SOLN
500.0000 mg | Freq: Two times a day (BID) | INTRAVENOUS | Status: DC
  Administered 2018-04-17 – 2018-04-19 (×5): 500 mg via INTRAVENOUS
  Filled 2018-04-17: qty 500
  Filled 2018-04-17 (×5): qty 0.5

## 2018-04-17 MED ORDER — SODIUM CHLORIDE 3 % IV SOLN
INTRAVENOUS | Status: AC
  Administered 2018-04-17 – 2018-04-18 (×2): 25 mL/h via INTRAVENOUS
  Filled 2018-04-17 (×3): qty 500

## 2018-04-17 MED ORDER — HEPARIN SODIUM (PORCINE) 5000 UNIT/ML IJ SOLN
5000.0000 [IU] | Freq: Three times a day (TID) | INTRAMUSCULAR | Status: DC
  Administered 2018-04-17 – 2018-04-21 (×11): 5000 [IU] via SUBCUTANEOUS
  Filled 2018-04-17 (×11): qty 1

## 2018-04-17 MED ORDER — FLUTICASONE FUROATE-VILANTEROL 100-25 MCG/INH IN AEPB
1.0000 | INHALATION_SPRAY | Freq: Every day | RESPIRATORY_TRACT | Status: DC
  Administered 2018-04-17 – 2018-04-18 (×2): 1 via RESPIRATORY_TRACT
  Filled 2018-04-17: qty 28

## 2018-04-17 NOTE — Progress Notes (Signed)
PHARMACY - PHYSICIAN COMMUNICATION CRITICAL VALUE ALERT - BLOOD CULTURE IDENTIFICATION (BCID)  Jodi Beasley is an 72 y.o. female who presented to [facility] on 04/16/2018 with a chief complaint of   Assessment:  2/4 E. coli KPC (-) (include suspected source if known)  Name of physician (or Provider) Contacted: Celesta Aver  Current antibiotics: ceftriaxone, azithromycin  Changes to prescribed antibiotics recommended:  Ceftriaxone changed to meropenem  Results for orders placed or performed during the hospital encounter of 04/16/18  Blood Culture ID Panel (Reflexed) (Collected: 04/16/2018 12:42 PM)  Result Value Ref Range   Enterococcus species NOT DETECTED NOT DETECTED   Listeria monocytogenes NOT DETECTED NOT DETECTED   Staphylococcus species NOT DETECTED NOT DETECTED   Staphylococcus aureus NOT DETECTED NOT DETECTED   Streptococcus species NOT DETECTED NOT DETECTED   Streptococcus agalactiae NOT DETECTED NOT DETECTED   Streptococcus pneumoniae NOT DETECTED NOT DETECTED   Streptococcus pyogenes NOT DETECTED NOT DETECTED   Acinetobacter baumannii NOT DETECTED NOT DETECTED   Enterobacteriaceae species DETECTED (A) NOT DETECTED   Enterobacter cloacae complex NOT DETECTED NOT DETECTED   Escherichia coli DETECTED (A) NOT DETECTED   Klebsiella oxytoca NOT DETECTED NOT DETECTED   Klebsiella pneumoniae NOT DETECTED NOT DETECTED   Proteus species NOT DETECTED NOT DETECTED   Serratia marcescens NOT DETECTED NOT DETECTED   Carbapenem resistance NOT DETECTED NOT DETECTED   Haemophilus influenzae NOT DETECTED NOT DETECTED   Neisseria meningitidis NOT DETECTED NOT DETECTED   Pseudomonas aeruginosa NOT DETECTED NOT DETECTED   Candida albicans NOT DETECTED NOT DETECTED   Candida glabrata NOT DETECTED NOT DETECTED   Candida krusei NOT DETECTED NOT DETECTED   Candida parapsilosis NOT DETECTED NOT DETECTED   Candida tropicalis NOT DETECTED NOT DETECTED    Gearlene Godsil S 04/17/2018  4:47 AM

## 2018-04-17 NOTE — Progress Notes (Signed)
[facility] [place] NAME: Jodi Beasley    MR#:  [id number]  DATE OF BIRTH:  1946/11/14  SUBJECTIVE:  CHIEF COMPLAINT:   Chief Complaint  Patient presents with  . Nausea  . Weakness    recently received Doxycycline, and had diarrhea- came with generalized weakness- found to have ac renal failure. Also noted to have bacteremia.  REVIEW OF SYSTEMS:  CONSTITUTIONAL: No fever,have fatigue or weakness.  EYES: No blurred or double vision.  EARS, NOSE, AND THROAT: No tinnitus or ear pain.  RESPIRATORY: No cough, shortness of breath, wheezing or hemoptysis.  CARDIOVASCULAR: No chest pain, orthopnea, edema.  GASTROINTESTINAL: No nausea, vomiting,have diarrhea ,no abdominal pain.  GENITOURINARY: No dysuria, hematuria.  ENDOCRINE: No polyuria, nocturia,  HEMATOLOGY: No anemia, easy bruising or bleeding SKIN: No rash or lesion. MUSCULOSKELETAL: No joint pain or arthritis.   NEUROLOGIC: No tingling, numbness, weakness.  PSYCHIATRY: No anxiety or depression.   ROS  DRUG ALLERGIES:   Allergies  Allergen Reactions  . Benzocaine     Tongue swelling  . Darvon [Propoxyphene Hcl]     HA  . Monosodium Glutamate Diarrhea  . Neosporin [Neomycin-Bacitracin Zn-Polymyx]     blisters  . Nicoderm [Nicotine]     arrythmia  . Prednisone     Cannot tolerate high doses per patient- caused depressive thoughts    VITALS:  Blood pressure 123/67, pulse 86, temperature 98.5 F (36.9 C), temperature source Axillary, resp. rate (!) 24, height 5\' 6"  (1.676 m), weight 75 kg (165 lb 5.5 oz), SpO2 92 %.  PHYSICAL EXAMINATION:  GENERAL:  72 y.o.-year-old patient lying in the bed with no acute distress.  EYES: Pupils equal, round, reactive to light and accommodation. No scleral icterus. Extraocular muscles intact.  HEENT: Head atraumatic, normocephalic. Oropharynx and nasopharynx clear.  NECK:  Supple, no jugular venous distention. No thyroid enlargement, no  tenderness.  LUNGS: Normal breath sounds bilaterally, no wheezing, rales,rhonchi or crepitation. No use of accessory muscles of respiration.  CARDIOVASCULAR: S1, S2 normal. No murmurs, rubs, or gallops.  ABDOMEN: Soft, nontender, nondistended. Bowel sounds present. No organomegaly or mass.  EXTREMITIES: No pedal edema, cyanosis, or clubbing.  NEUROLOGIC: Cranial nerves II through XII are intact. Muscle strength 4/5 in all extremities. Sensation intact. Gait not checked.  PSYCHIATRIC: The patient is alert and oriented x 3.  SKIN: No obvious rash, lesion, or ulcer.   Physical Exam LABORATORY PANEL:   CBC Recent Labs  Lab 04/17/18 0547  WBC 21.2*  HGB 11.4*  HCT 33.7*  PLT 120*   ------------------------------------------------------------------------------------------------------------------  Chemistries  Recent Labs  Lab 04/16/18 1055  04/17/18 0547  04/17/18 1848  NA 115*   < > 119*   < > 118*  K 3.9  --  3.7  --   --   CL 83*  --  89*  --   --   CO2 17*  --  19*  --   --   GLUCOSE 148*  --  93  --   --   BUN 47*  --  50*  --   --   CREATININE 3.23*  --  3.83*  --   --   CALCIUM 8.4*  --  7.6*  --   --   AST 44*  --   --   --   --   ALT 28  --   --   --   --   ALKPHOS 95  --   --   --   --  BILITOT 1.1  --   --   --   --    < > = values in this interval not displayed.   ------------------------------------------------------------------------------------------------------------------  Cardiac Enzymes No results for input(s): TROPONINI in the last 168 hours. ------------------------------------------------------------------------------------------------------------------  RADIOLOGY:  Dg Chest 2 View  Result Date: 04/16/2018 CLINICAL DATA:  Several recent falls.  Weakness and nausea EXAM: CHEST - 2 VIEW COMPARISON:  July 21, 2016 FINDINGS: There is no edema or consolidation. The heart size and pulmonary vascularity are normal. No adenopathy. There is aortic  atherosclerosis. No pneumothorax. No evident bone lesions. IMPRESSION: Aortic atherosclerosis.  No edema or consolidation. Aortic Atherosclerosis (ICD10-I70.0). Electronically Signed   By: Lowella Grip III M.D.   On: 04/16/2018 12:01   US Renal  Result Date: 04/17/2018 CLINICAL DATA:  Acute renal failure EXAM: RENAL / URINARY TRACT ULTRASOUND COMPLETE COMPARISON:  Lumbar MRI Apr 21, 2017 FINDINGS: Right Kidney: Length: 10.4 cm. The right kidney is inferiorly positioned in the right lower quadrant region. Echogenicity and renal cortical thickness are within normal limits. No mass, perinephric fluid, or hydronephrosis visualized. No sonographically demonstrable calculus or ureterectasis. Left Kidney: Length: 13.4 cm. Echogenicity and renal cortical thickness are within normal limits. No perinephric fluid or hydronephrosis visualized. There is a left upper pole parapelvic cyst measuring 2.2 x 1.0 x 2.4 cm. No sonographically demonstrable calculus or ureterectasis. Bladder: Appears normal for degree of bladder distention. There is a cyst arising from the right ovary measuring 3.8 x 2.5 x 2.9 cm. IMPRESSION: 1. 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 2.  Upper pole parapelvic cyst left kidney, also seen on prior MR. 3. No obstructing focus in either kidney. No renal cortical thinning or increased echogenicity in either kidney. 4. Question size discrepancy between kidneys. Given the rotation of the right kidney, this finding may be spurious. The possibility of renal artery stenosis on the right must be question given apparent size discrepancy between kidneys. In this regard, question whether patient is hypertensive. 5. Incidental note is made of right ovarian cyst measuring 3.8 x 2.5 x 2.9 cm. Electronically Signed   By: Lowella Grip III M.D.   On: 04/17/2018 10:05    ASSESSMENT AND PLAN:   Active Problems:   Hyponatremia *  Severe hyponatremia  in the context of diarrhea On IV fluids Sodium level every 6 hours- not improved much. Normal TSH, urine sodium <10 Nephrology consultation appreciated, suggested IV fluids.  *  Acute kidney injury in the setting of dehydration with diarrhea Continue IV fluids and repeat BMP in a.m. Hold nephrotoxic medications Nephrology consultation appreciated.   Get Renal US.  * bacteremia   Changed to meropenem.   Follow bl cx report.  *  Community-acquired pneumonia with leukocytosis: Continue Rocephin and azithromycin Follow-up on blood cultures Have bacteremia- changed Abx.  *  CAD: Continue aspirin, statin, metoprolol  *  Diarrhea: Check C. Difficile  *  Hypothyroidism: Continue Synthroid  *  COPD without signs of exacerbation  *  Depression: Continue Celexa  * Tobacco dependence: Patient is encouraged to quit smoking. Counseling was provided for 4 minutes.  All the records are reviewed and case discussed with Care Management/Social Workerr. Management plans discussed with the patient, family and they are in agreement.  CODE STATUS: full.  TOTAL TIME TAKING CARE OF THIS PATIENT: 35 minutes.     POSSIBLE D/C IN 2-3 DAYS, DEPENDING ON CLINICAL CONDITION.   Vaughan Basta M.D on  04/17/2018   Between 7am to 6pm - Pager - [phone]  After 6pm go to [email or web address] - password [email or web address] [facility]  Office  [phone]  CC: Primary care physician; Leone Haven, MD  Note: This dictation was prepared with Dragon dictation along with smaller phrase technology. Any transcriptional errors that result from this process are unintentional.

## 2018-04-17 NOTE — Progress Notes (Signed)
[facility]  ROUNDING NOTE   Subjective:   UOP 1241mL  Na 119 (115)  Creatinine 3.83 (3.23)  NS at 75  Blood culture with E. coli  Objective:  Vital signs in last 24 hours:  Temp:  [97.8 F (36.6 C)-100.3 F (37.9 C)] 98.3 F (36.8 C) (05/14 0800) Pulse Rate:  [77-96] 78 (05/14 1100) Resp:  [15-27] 20 (05/14 1100) BP: (103-164)/(53-98) 129/76 (05/14 1100) SpO2:  [91 %-98 %] 96 % (05/14 1100) Weight:  [72.3 kg (159 lb 6.3 oz)-75 kg (165 lb 5.5 oz)] 75 kg (165 lb 5.5 oz) (05/14 0500)  Weight change:  Filed Weights   04/16/18 1048 04/16/18 1500 04/17/18 0500  Weight: 68 kg (150 lb) 72.3 kg (159 lb 6.3 oz) 75 kg (165 lb 5.5 oz)    Intake/Output: I/O last 3 completed shifts: In: 1432.5 [I.V.:982.5; IV Piggyback:450] Out: 1250 [Urine:1250]   Intake/Output this shift:  Total I/O In: 600 [I.V.:600] Out: -   Physical Exam: General: NAD, laying in bed  Head: Normocephalic, atraumatic. Moist oral mucosal membranes  Eyes: Anicteric, PERRL  Neck: Supple, trachea midline  Lungs:  Clear to auscultation  Heart: Regular rate and rhythm  Abdomen:  Soft, nontender,   Extremities: no peripheral edema.  Neurologic: Nonfocal, moving all four extremities  Skin: No lesions        Basic Metabolic Panel: Recent Labs  Lab 04/16/18 1055 04/16/18 1506 04/17/18 0101 04/17/18 0547 04/17/18 1057  NA 115* 119* 119* 119* 116*  K 3.9  --   --  3.7  --   CL 83*  --   --  89*  --   CO2 17*  --   --  19*  --   GLUCOSE 148*  --   --  93  --   BUN 47*  --   --  50*  --   CREATININE 3.23*  --   --  3.83*  --   CALCIUM 8.4*  --   --  7.6*  --     Liver Function Tests: Recent Labs  Lab 04/16/18 1055  AST 44*  ALT 28  ALKPHOS 95  BILITOT 1.1  PROT 7.0  ALBUMIN 3.0*   Recent Labs  Lab 04/16/18 1055  LIPASE 21   No results for input(s): AMMONIA in the last 168 hours.  CBC: Recent Labs  Lab 04/16/18 1055 04/17/18 0547  WBC 30.2* 21.2*  NEUTROABS 27.6*   --   HGB 13.8 11.4*  HCT 39.7 33.7*  MCV 86.1 88.4  PLT 136* 120*    Cardiac Enzymes: No results for input(s): CKTOTAL, CKMB, CKMBINDEX, TROPONINI in the last 168 hours.  BNP: Invalid input(s): POCBNP  CBG: Recent Labs  Lab 04/16/18 1410  GLUCAP 115*    Microbiology: Results for orders placed or performed during the hospital encounter of 04/16/18  Blood culture (routine x 2)     Status: None (Preliminary result)   Collection Time: 04/16/18 12:42 PM  Result Value Ref Range Status   Specimen Description   Final    BLOOD RIGHT ANTECUBITAL Performed at [facility], [place] [address], [place], [place] [address]    Special Requests   Final    BOTTLES DRAWN AEROBIC AND ANAEROBIC Blood Culture adequate volume Performed at [address] [place], [place] [address]    Culture  Setup Time   Final    GRAM NEGATIVE RODS AEROBIC BOTTLE ONLY CRITICAL RESULT CALLED TO, READ BACK BY AND  VERIFIED WITH: MATT MCBANE ON 04/17/18 AT 0042 JAG Performed at [facility], [place] [address] [place], [place] [address]    Culture GRAM NEGATIVE RODS  Final   Report Status PENDING  Incomplete  Blood culture (routine x 2)     Status: None (Preliminary result)   Collection Time: 04/16/18 12:42 PM  Result Value Ref Range Status   Specimen Description   Final    BLOOD RIGHT HAND Performed at [facility], [place] [address], [place], [place] [address]    Special Requests   Final    BOTTLES DRAWN AEROBIC AND ANAEROBIC Blood Culture results may not be optimal due to an inadequate volume of blood received in culture bottles   Culture  Setup Time   Final    GRAM NEGATIVE RODS AEROBIC BOTTLE ONLY CRITICAL VALUE NOTED.  VALUE IS CONSISTENT WITH PREVIOUSLY REPORTED AND CALLED VALUE. JAG Performed at [facility], [address] [place], [place] [address]    Culture GRAM NEGATIVE RODS  Final   Report Status PENDING  Incomplete  Blood Culture ID Panel  (Reflexed)     Status: Abnormal   Collection Time: 04/16/18 12:42 PM  Result Value Ref Range Status   Enterococcus species NOT DETECTED NOT DETECTED Final   Listeria monocytogenes NOT DETECTED NOT DETECTED Final   Staphylococcus species NOT DETECTED NOT DETECTED Final   Staphylococcus aureus NOT DETECTED NOT DETECTED Final   Streptococcus species NOT DETECTED NOT DETECTED Final   Streptococcus agalactiae NOT DETECTED NOT DETECTED Final   Streptococcus pneumoniae NOT DETECTED NOT DETECTED Final   Streptococcus pyogenes NOT DETECTED NOT DETECTED Final   Acinetobacter baumannii NOT DETECTED NOT DETECTED Final   Enterobacteriaceae species DETECTED (A) NOT DETECTED Final    Comment: Enterobacteriaceae represent a large family of gram-negative bacteria, not a single organism. CRITICAL RESULT CALLED TO, READ BACK BY AND VERIFIED WITH: MATT MCBANE ON 04/17/18 AT 0042 JAG    Enterobacter cloacae complex NOT DETECTED NOT DETECTED Final   Escherichia coli DETECTED (A) NOT DETECTED Final    Comment: CRITICAL RESULT CALLED TO, READ BACK BY AND VERIFIED WITH: MATT MCBANE ON 04/17/18 AT 0042 JAG    Klebsiella oxytoca NOT DETECTED NOT DETECTED Final   Klebsiella pneumoniae NOT DETECTED NOT DETECTED Final   Proteus species NOT DETECTED NOT DETECTED Final   Serratia marcescens NOT DETECTED NOT DETECTED Final   Carbapenem resistance NOT DETECTED NOT DETECTED Final   Haemophilus influenzae NOT DETECTED NOT DETECTED Final   Neisseria meningitidis NOT DETECTED NOT DETECTED Final   Pseudomonas aeruginosa NOT DETECTED NOT DETECTED Final   Candida albicans NOT DETECTED NOT DETECTED Final   Candida glabrata NOT DETECTED NOT DETECTED Final   Candida krusei NOT DETECTED NOT DETECTED Final   Candida parapsilosis NOT DETECTED NOT DETECTED Final   Candida tropicalis NOT DETECTED NOT DETECTED Final    Comment: Performed at [facility], [address] [place], [place] [address]  CULTURE, BLOOD  (ROUTINE X 2) w Reflex to ID Panel     Status: None (Preliminary result)   Collection Time: 04/16/18  6:23 PM  Result Value Ref Range Status   Specimen Description BLOOD  R FOREARM   Final   Special Requests   Final    BOTTLES DRAWN AEROBIC AND ANAEROBIC Blood Culture results may not be optimal due to an excessive volume of blood received in culture bottles   Culture   Final    NO GROWTH < 24  HOURS Performed at [facility], [address] [place], [place] [address]    Report Status PENDING  Incomplete  CULTURE, BLOOD (ROUTINE X 2) w Reflex to ID Panel     Status: None (Preliminary result)   Collection Time: 04/16/18  6:33 PM  Result Value Ref Range Status   Specimen Description BLOOD  L WRIST  Final   Special Requests   Final    BOTTLES DRAWN AEROBIC AND ANAEROBIC Blood Culture results may not be optimal due to an excessive volume of blood received in culture bottles   Culture   Final    NO GROWTH < 24 HOURS Performed at [facility], [address] [place], [place] [address]    Report Status PENDING  Incomplete  MRSA PCR Screening     Status: None   Collection Time: 04/17/18  8:50 AM  Result Value Ref Range Status   MRSA by PCR NEGATIVE NEGATIVE Final    Comment:        The GeneXpert MRSA Assay (FDA approved for NASAL specimens only), is one component of a comprehensive MRSA colonization surveillance program. It is not intended to diagnose MRSA infection nor to guide or monitor treatment for MRSA infections. Performed at [facility], [address] [place], [place] [address]     Coagulation Studies: No results for input(s): LABPROT, INR in the last 72 hours.  Urinalysis: No results for input(s): COLORURINE, LABSPEC, PHURINE, GLUCOSEU, HGBUR, BILIRUBINUR, KETONESUR, PROTEINUR, UROBILINOGEN, NITRITE, LEUKOCYTESUR in the last 72 hours.  Invalid input(s): APPERANCEUR    Imaging: Dg Chest 2 View  Result Date: 04/16/2018 CLINICAL DATA:   Several recent falls.  Weakness and nausea EXAM: CHEST - 2 VIEW COMPARISON:  July 21, 2016 FINDINGS: There is no edema or consolidation. The heart size and pulmonary vascularity are normal. No adenopathy. There is aortic atherosclerosis. No pneumothorax. No evident bone lesions. IMPRESSION: Aortic atherosclerosis.  No edema or consolidation. Aortic Atherosclerosis (ICD10-I70.0). Electronically Signed   By: Lowella Grip III M.D.   On: 04/16/2018 12:01   US Renal  Result Date: 04/17/2018 CLINICAL DATA:  Acute renal failure EXAM: RENAL / URINARY TRACT ULTRASOUND COMPLETE COMPARISON:  Lumbar MRI Apr 21, 2017 FINDINGS: Right Kidney: Length: 10.4 cm. The right kidney is inferiorly positioned in the right lower quadrant region. Echogenicity and renal cortical thickness are within normal limits. No mass, perinephric fluid, or hydronephrosis visualized. No sonographically demonstrable calculus or ureterectasis. Left Kidney: Length: 13.4 cm. Echogenicity and renal cortical thickness are within normal limits. No perinephric fluid or hydronephrosis visualized. There is a left upper pole parapelvic cyst measuring 2.2 x 1.0 x 2.4 cm. No sonographically demonstrable calculus or ureterectasis. Bladder: Appears normal for degree of bladder distention. There is a cyst arising from the right ovary measuring 3.8 x 2.5 x 2.9 cm. IMPRESSION: 1. 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 2.  Upper pole parapelvic cyst left kidney, also seen on prior MR. 3. No obstructing focus in either kidney. No renal cortical thinning or increased echogenicity in either kidney. 4. Question size discrepancy between kidneys. Given the rotation of the right kidney, this finding may be spurious. The possibility of renal artery stenosis on the right must be question given apparent size discrepancy between kidneys. In this regard, question whether patient is hypertensive. 5. Incidental note is  made of right ovarian cyst measuring 3.8 x 2.5 x 2.9 cm. Electronically Signed   By: Lowella Grip  III M.D.   On: 04/17/2018 10:05     Medications:   . sodium chloride 75 mL/hr at 04/17/18 1100  . meropenem (MERREM) IV Stopped (04/17/18 1610)   . aspirin  81 mg Oral Daily  . atorvastatin  40 mg Oral q1800  . buPROPion  150 mg Oral Daily  . carisoprodol  350 mg Oral QHS  . cholecalciferol  1,000 Units Oral Daily  . citalopram  20 mg Oral Daily  . fluticasone  2 spray Each Nare Daily  . fluticasone furoate-vilanterol  1 puff Inhalation Daily  . heparin injection (subcutaneous)  5,000 Units Subcutaneous Q8H  . levothyroxine  112 mcg Oral QAC breakfast  . metoprolol tartrate  12.5 mg Oral BID  . multivitamin with minerals  1 tablet Oral Daily  . pantoprazole  40 mg Oral Daily  . vitamin B-12  100 mcg Oral Daily   acetaminophen **OR** acetaminophen, albuterol, ondansetron **OR** ondansetron (ZOFRAN) IV, polyethylene glycol  Assessment/ Plan:  [name] is a 72 y.o. white female [name] is a 72 y.o. white female with COPD, coronary artery disease, depression, GERD, hyperlipidemia, hypothyroidism, who was admitted to [facility] on 04/16/2018  1. Acute Renal Failure 2. Acute renal failure 3. Metabolic Acidosis 4. Hypertension  Plan Hypovolemic hyponatremia with prerenal azotemia.  - Start hypertonic saline.  - Discussed with critical care, Dr. Alva Garnet.      LOS: 1 [place] W[place]t 5/14/201912:07 PM

## 2018-04-17 NOTE — Progress Notes (Signed)
PULMONARY / CRITICAL CARE MEDICINE   Name: Jodi Beasley MRN: [id number] DOB: [birth year]    ADMISSION DATE:  04/16/2018  PT PROFILE:   89 F with recent sinusitis treated with abx and admitted with diarrhea, hyponatremia (Na 115)    MICRO DATA: Blood 5/13 >> 2/2 E coli MRSA PCR 5/14 >> NEG Urine 5/14 >>    ANTIMICROBIALS:  Anti-infectives (From admission, onward)   Start     Dose/Rate Route Frequency Ordered Stop   04/17/18 1000  cefTRIAXone (ROCEPHIN) 2 g in sodium chloride 0.9 % 100 mL IVPB  Status:  Discontinued     2 g 200 mL/hr over 30 Minutes Intravenous Every 24 hours 04/16/18 1314 04/17/18 0446   04/17/18 0600  meropenem (MERREM) 500 mg in sodium chloride 0.9 % 100 mL IVPB     500 mg 200 mL/hr over 30 Minutes Intravenous Every 12 hours 04/17/18 0446     04/16/18 1815  vancomycin (VANCOCIN) 50 mg/mL oral solution 125 mg  Status:  Discontinued     125 mg Oral 4 times daily 04/16/18 1808 04/17/18 1049   04/16/18 1500  azithromycin (ZITHROMAX) 500 mg in sodium chloride 0.9 % 250 mL IVPB  Status:  Discontinued     500 mg 250 mL/hr over 60 Minutes Intravenous Every 24 hours 04/16/18 1431 04/17/18 1049   04/16/18 1500  cefTRIAXone (ROCEPHIN) 1 g in sodium chloride 0.9 % 100 mL IVPB     1 g 200 mL/hr over 30 Minutes Intravenous  Once 04/16/18 1314 04/16/18 1708   04/16/18 1215  cefTRIAXone (ROCEPHIN) 1 g in sodium chloride 0.9 % 100 mL IVPB     1 g 200 mL/hr over 30 Minutes Intravenous  Once 04/16/18 1202 04/16/18 1340   04/16/18 1215  doxycycline (VIBRA-TABS) tablet 100 mg     100 mg Oral  Once 04/16/18 1202 04/16/18 1244        SUBJECTIVE:  No distress.  Comfortable on room air.  Cognition intact.  Reports fatigue and weakness.  Denies dyspnea and pain  VITAL SIGNS: BP (!) 111/53   Pulse 79   Temp 98.6 F (37 C) (Oral)   Resp 19   Ht 5\' 6"  (1.676 m)   Wt 165 lb 5.5 oz (75 kg)   SpO2 99%   BMI 26.69 kg/m   HEMODYNAMICS:    VENTILATOR SETTINGS:    INTAKE  / OUTPUT: I/O last 3 completed shifts: In: 1432.5 [I.V.:982.5; IV Piggyback:450] Out: 1250 [Urine:1250]  PHYSICAL EXAMINATION: General: Somewhat fatigued appearing, NAD Neuro: No focal deficits HEENT: NCAT, sclerae white Cardiovascular: Regular, no M Lungs: Clear to auscultation and percussion Abdomen: Soft, NABS Extremities: No edema, warm  LABS:  BMET Recent Labs  Lab 04/16/18 1055  04/17/18 0547 04/17/18 1057 04/17/18 1242 04/17/18 1509  NA 115*   < > 119* 116* 117* 116*  K 3.9  --  3.7  --   --   --   CL 83*  --  89*  --   --   --   CO2 17*  --  19*  --   --   --   BUN 47*  --  50*  --   --   --   CREATININE 3.23*  --  3.83*  --   --   --   GLUCOSE 148*  --  93  --   --   --    < > = values in this interval not displayed.    Electrolytes Recent  Labs  Lab 04/16/18 1055 04/17/18 0547  CALCIUM 8.4* 7.6*    CBC Recent Labs  Lab 04/16/18 1055 04/17/18 0547  WBC 30.2* 21.2*  HGB 13.8 11.4*  HCT 39.7 33.7*  PLT 136* 120*    Coag's No results for input(s): APTT, INR in the last 168 hours.  Sepsis Markers Recent Labs  Lab 04/16/18 1129  LATICACIDVEN 1.8    ABG No results for input(s): PHART, PCO2ART, PO2ART in the last 168 hours.  Liver Enzymes Recent Labs  Lab 04/16/18 1055  AST 44*  ALT 28  ALKPHOS 95  BILITOT 1.1  ALBUMIN 3.0*    Cardiac Enzymes No results for input(s): TROPONINI, PROBNP in the last 168 hours.  Glucose Recent Labs  Lab 04/16/18 1410  GLUCAP 115*    Imaging US Renal 04/17/18: 1. 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 2.  Upper pole parapelvic cyst left kidney, also seen on prior MR. 3. No obstructing focus in either kidney. No renal cortical thinning or increased echogenicity in either kidney. 4. Question size discrepancy between kidneys. Given the rotation of the right kidney, this finding may be spurious. The possibility of renal artery stenosis on the right  must be question given apparent size discrepancy between kidneys. In this regard, question whether patient is hypertensive. 5. Incidental note is made of right ovarian cyst measuring 3.8 x 2.5 x 2.9 cm.    ASSESSMENT / PLAN: E coli bacteremia - unclear source Moderate hyponatremia  Continue meropenem pending sensitivities Hypertonic saline per nephrology Free water restriction She may be transferred to [place] floor if they can take hypertonic saline on the floor   Merton Border, MD PCCM service Mobile [phone] Pager [phone] 04/17/2018 4:29 PM

## 2018-04-18 LAB — BASIC METABOLIC PANEL
ANION GAP: 12 (ref 5–15)
BUN: 56 mg/dL — AB (ref 6–20)
CHLORIDE: 94 mmol/L — AB (ref 101–111)
CO2: 17 mmol/L — ABNORMAL LOW (ref 22–32)
Calcium: 8 mg/dL — ABNORMAL LOW (ref 8.9–10.3)
Creatinine, Ser: 4.23 mg/dL — ABNORMAL HIGH (ref 0.44–1.00)
GFR calc Af Amer: 11 mL/min — ABNORMAL LOW (ref >60)
GFR, EST NON AFRICAN AMERICAN: 10 mL/min — AB (ref >60)
Glucose, Bld: 93 mg/dL (ref 65–99)
Potassium: 3.6 mmol/L (ref 3.5–5.1)
SODIUM: 123 mmol/L — AB (ref 135–145)

## 2018-04-18 LAB — SODIUM
SODIUM: 122 mmol/L — AB (ref 135–145)
SODIUM: 122 mmol/L — AB (ref 135–145)

## 2018-04-18 LAB — CBC
HCT: 34.2 % — ABNORMAL LOW (ref 35.0–47.0)
HEMOGLOBIN: 11.7 g/dL — AB (ref 12.0–16.0)
MCH: 30.3 pg (ref 26.0–34.0)
MCHC: 34.3 g/dL (ref 32.0–36.0)
MCV: 88.4 fL (ref 80.0–100.0)
Platelets: 124 10*3/uL — ABNORMAL LOW (ref 150–440)
RBC: 3.87 MIL/uL (ref 3.80–5.20)
RDW: 14.9 % — ABNORMAL HIGH (ref 11.5–14.5)
WBC: 17.1 10*3/uL — AB (ref 3.6–11.0)

## 2018-04-18 MED ORDER — IPRATROPIUM-ALBUTEROL 0.5-2.5 (3) MG/3ML IN SOLN
3.0000 mL | Freq: Four times a day (QID) | RESPIRATORY_TRACT | Status: DC
  Administered 2018-04-18 – 2018-04-19 (×6): 3 mL via RESPIRATORY_TRACT
  Filled 2018-04-18 (×6): qty 3

## 2018-04-18 MED ORDER — FLUTICASONE FUROATE-VILANTEROL 100-25 MCG/INH IN AEPB
1.0000 | INHALATION_SPRAY | Freq: Every day | RESPIRATORY_TRACT | Status: DC
  Administered 2018-04-19 – 2018-04-21 (×3): 1 via RESPIRATORY_TRACT
  Filled 2018-04-18: qty 28

## 2018-04-18 MED ORDER — SODIUM CHLORIDE 3 % IV SOLN
INTRAVENOUS | Status: AC
  Administered 2018-04-18: 25 mL/h via INTRAVENOUS
  Filled 2018-04-18 (×2): qty 500

## 2018-04-18 MED ORDER — SODIUM BICARBONATE 650 MG PO TABS
650.0000 mg | ORAL_TABLET | Freq: Two times a day (BID) | ORAL | Status: DC
  Administered 2018-04-18 – 2018-04-19 (×4): 650 mg via ORAL
  Filled 2018-04-18 (×6): qty 1

## 2018-04-18 NOTE — Progress Notes (Signed)
[facility] NAME: Jodi Beasley    MR#:  [id number]  DATE OF BIRTH:  10/10/1946  SUBJECTIVE:  CHIEF COMPLAINT:   Chief Complaint  Patient presents with  . Nausea  . Weakness    recently received Doxycycline, and had diarrhea- came with generalized weakness- found to have ac renal failure. Also noted to have bacteremia.  REVIEW OF SYSTEMS:  CONSTITUTIONAL: No fever,have fatigue or weakness.  EYES: No blurred or double vision.  EARS, NOSE, AND THROAT: No tinnitus or ear pain.  RESPIRATORY: No cough, shortness of breath, wheezing or hemoptysis.  CARDIOVASCULAR: No chest pain, orthopnea, edema.  GASTROINTESTINAL: No nausea, vomiting,have diarrhea ,no abdominal pain.  GENITOURINARY: No dysuria, hematuria.  ENDOCRINE: No polyuria, nocturia,  HEMATOLOGY: No anemia, easy bruising or bleeding SKIN: No rash or lesion. MUSCULOSKELETAL: No joint pain or arthritis.   NEUROLOGIC: No tingling, numbness, weakness.  PSYCHIATRY: No anxiety or depression.   ROS  DRUG ALLERGIES:   Allergies  Allergen Reactions  . Benzocaine     Tongue swelling  . Darvon [Propoxyphene Hcl]     HA  . Monosodium Glutamate Diarrhea  . Neosporin [Neomycin-Bacitracin Zn-Polymyx]     blisters  . Nicoderm [Nicotine]     arrythmia  . Prednisone     Cannot tolerate high doses per patient- caused depressive thoughts    VITALS:  Blood pressure (!) 148/91, pulse 96, temperature 98.7 F (37.1 C), temperature source Oral, resp. rate 18, height 5\' 6"  (1.676 m), weight 75 kg (165 lb 5.5 oz), SpO2 97 %.  PHYSICAL EXAMINATION:  GENERAL:  72 y.o.-year-old patient lying in the bed with no acute distress.  EYES: Pupils equal, round, reactive to light and accommodation. No scleral icterus. Extraocular muscles intact.  HEENT: Head atraumatic, normocephalic. Oropharynx and nasopharynx clear.  NECK:  Supple, no jugular venous distention. No thyroid enlargement, no  tenderness.  LUNGS: Normal breath sounds bilaterally, no wheezing, rales,rhonchi or crepitation. No use of accessory muscles of respiration.  CARDIOVASCULAR: S1, S2 normal. No murmurs, rubs, or gallops.  ABDOMEN: Soft, nontender, nondistended. Bowel sounds present. No organomegaly or mass.  EXTREMITIES: No pedal edema, cyanosis, or clubbing.  NEUROLOGIC: Cranial nerves II through XII are intact. Muscle strength 4/5 in all extremities. Sensation intact. Gait not checked.  PSYCHIATRIC: The patient is alert and oriented x 3.  SKIN: No obvious rash, lesion, or ulcer.   Physical Exam LABORATORY PANEL:   CBC Recent Labs  Lab 04/18/18 0554  WBC 17.1*  HGB 11.7*  HCT 34.2*  PLT 124*   ------------------------------------------------------------------------------------------------------------------  Chemistries  Recent Labs  Lab 04/16/18 1055  04/18/18 0554 04/18/18 1156  NA 115*   < > 123* 122*  K 3.9   < > 3.6  --   CL 83*   < > 94*  --   CO2 17*   < > 17*  --   GLUCOSE 148*   < > 93  --   BUN 47*   < > 56*  --   CREATININE 3.23*   < > 4.23*  --   CALCIUM 8.4*   < > 8.0*  --   AST 44*  --   --   --   ALT 28  --   --   --   ALKPHOS 95  --   --   --   BILITOT 1.1  --   --   --    < > = values in  this interval not displayed.   ------------------------------------------------------------------------------------------------------------------  Cardiac Enzymes No results for input(s): TROPONINI in the last 168 hours. ------------------------------------------------------------------------------------------------------------------  RADIOLOGY:  US Renal  Result Date: 04/17/2018 CLINICAL DATA:  Acute renal failure EXAM: RENAL / URINARY TRACT ULTRASOUND COMPLETE COMPARISON:  Lumbar MRI Apr 21, 2017 FINDINGS: Right Kidney: Length: 10.4 cm. The right kidney is inferiorly positioned in the right lower quadrant region. Echogenicity and renal cortical thickness are within normal limits.  No mass, perinephric fluid, or hydronephrosis visualized. No sonographically demonstrable calculus or ureterectasis. Left Kidney: Length: 13.4 cm. Echogenicity and renal cortical thickness are within normal limits. No perinephric fluid or hydronephrosis visualized. There is a left upper pole parapelvic cyst measuring 2.2 x 1.0 x 2.4 cm. No sonographically demonstrable calculus or ureterectasis. Bladder: Appears normal for degree of bladder distention. There is a cyst arising from the right ovary measuring 3.8 x 2.5 x 2.9 cm. IMPRESSION: 1. 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 2.  Upper pole parapelvic cyst left kidney, also seen on prior MR. 3. No obstructing focus in either kidney. No renal cortical thinning or increased echogenicity in either kidney. 4. Question size discrepancy between kidneys. Given the rotation of the right kidney, this finding may be spurious. The possibility of renal artery stenosis on the right must be question given apparent size discrepancy between kidneys. In this regard, question whether patient is hypertensive. 5. Incidental note is made of right ovarian cyst measuring 3.8 x 2.5 x 2.9 cm. Electronically Signed   By: Lowella Grip III M.D.   On: 04/17/2018 10:05    ASSESSMENT AND PLAN:   Active Problems:   Hyponatremia *  Severe hyponatremia in the context of diarrhea On IV fluids. Sodium level every 6 hours- not improved much. Normal TSH, urine sodium <10 Nephrology consultation appreciated, suggested IV fluids.  No improvement with NS, so started on Hypertonic saline and on fluid restriction as she was drinking a lot of liquids.  *  Acute kidney injury in the setting of dehydration with diarrhea Continue IV fluids and repeat BMP in a.m. Hold nephrotoxic medications Nephrology consultation appreciated.   Get Renal US- no blockages.  * bacteremia   Changed to meropenem.   Follow bl cx report.  *   Community-acquired pneumonia with leukocytosis:   Given Rocephin and azithromycin Follow-up on blood cultures Have bacteremia- changed Abx.  *  CAD: Continue aspirin, statin, metoprolol  *  Diarrhea: Check C. Difficile  now don't have diarrhea , so don't need to check.  *  Hypothyroidism: Continue Synthroid.  * COPD without signs of exacerbation.  *  Depression: Continue Celexa.  * Tobacco dependence: Patient is encouraged to quit smoking. Counseling was provided for 4 minutes.  All the records are reviewed and case discussed with Care Management/Social Workerr. Management plans discussed with the patient, family and they are in agreement.  CODE STATUS: full.  TOTAL TIME TAKING CARE OF THIS PATIENT: 35 minutes.   POSSIBLE D/C IN 2-3 DAYS, DEPENDING ON CLINICAL CONDITION.   Vaughan Basta M.D on 04/18/2018   Between 7am to 6pm - Pager - [phone]  After 6pm go to [email or web address] - password [email or web address] [facility]  Office  [phone]  CC: Primary care physician; Leone Haven, MD  Note: This dictation was prepared with Dragon dictation along with smaller phrase technology. Any transcriptional errors that result from this process are unintentional.

## 2018-04-18 NOTE — Progress Notes (Signed)
[facility]  ROUNDING NOTE   Subjective:   Transferred out of ICU  UOP 875  CO2 17  Creatinine 4.23 (3.83)  On 3% saline at 35mL/hr  Blood cultures growing gram negative rods - meropenem  Objective:  Vital signs in last 24 hours:  Temp:  [98.5 F (36.9 C)-98.7 F (37.1 C)] 98.7 F (37.1 C) (05/15 1045) Pulse Rate:  [77-96] 96 (05/15 1045) Resp:  [11-24] 18 (05/15 1045) BP: (89-148)/(53-107) 148/91 (05/15 1045) SpO2:  [87 %-100 %] 100 % (05/15 1045)  Weight change:  Filed Weights   04/16/18 1048 04/16/18 1500 04/17/18 0500  Weight: 68 kg (150 lb) 72.3 kg (159 lb 6.3 oz) 75 kg (165 lb 5.5 oz)    Intake/Output: I/O last 3 completed shifts: In: 2357.9 [P.O.:240; I.V.:1817.9; IV Piggyback:300] Out: 2125 [Urine:2125]   Intake/Output this shift:  Total I/O In: 25 [I.V.:25] Out: -   Physical Exam: General: NAD, laying in bed  Head: Normocephalic, atraumatic. Moist oral mucosal membranes  Eyes: Anicteric, PERRL  Neck: Supple, trachea midline  Lungs:  Clear to auscultation  Heart: Regular rate and rhythm  Abdomen:  Soft, nontender,   Extremities: no peripheral edema.  Neurologic: Nonfocal, moving all four extremities  Skin: No lesions        Basic Metabolic Panel: Recent Labs  Lab 04/16/18 1055  04/17/18 0547  04/17/18 1242 04/17/18 1509 04/17/18 1848 04/18/18 0554 04/18/18 1156  NA 115*   < > 119*   < > 117* 116* 118* 123* 122*  K 3.9  --  3.7  --   --   --   --  3.6  --   CL 83*  --  89*  --   --   --   --  94*  --   CO2 17*  --  19*  --   --   --   --  17*  --   GLUCOSE 148*  --  93  --   --   --   --  93  --   BUN 47*  --  50*  --   --   --   --  56*  --   CREATININE 3.23*  --  3.83*  --   --   --   --  4.23*  --   CALCIUM 8.4*  --  7.6*  --   --   --   --  8.0*  --    < > = values in this interval not displayed.    Liver Function Tests: Recent Labs  Lab 04/16/18 1055  AST 44*  ALT 28  ALKPHOS 95  BILITOT 1.1  PROT 7.0   ALBUMIN 3.0*   Recent Labs  Lab 04/16/18 1055  LIPASE 21   No results for input(s): AMMONIA in the last 168 hours.  CBC: Recent Labs  Lab 04/16/18 1055 04/17/18 0547 04/18/18 0554  WBC 30.2* 21.2* 17.1*  NEUTROABS 27.6*  --   --   HGB 13.8 11.4* 11.7*  HCT 39.7 33.7* 34.2*  MCV 86.1 88.4 88.4  PLT 136* 120* 124*    Cardiac Enzymes: No results for input(s): CKTOTAL, CKMB, CKMBINDEX, TROPONINI in the last 168 hours.  BNP: Invalid input(s): POCBNP  CBG: Recent Labs  Lab 04/16/18 1410  GLUCAP 115*    Microbiology: Results for orders placed or performed during the hospital encounter of 04/16/18  Blood culture (routine x 2)     Status: Abnormal (Preliminary result)  Collection Time: 04/16/18 12:42 PM  Result Value Ref Range Status   Specimen Description   Final    BLOOD RIGHT ANTECUBITAL Performed at [facility], [place] [address] [place], [place] [address]    Special Requests   Final    BOTTLES DRAWN AEROBIC AND ANAEROBIC Blood Culture adequate volume Performed at [place] [place], [place] [address]    Culture  Setup Time   Final    GRAM NEGATIVE RODS AEROBIC BOTTLE ONLY CRITICAL RESULT CALLED TO, READ BACK BY AND VERIFIED WITH: MATT MCBANE ON 04/17/18 AT 0042 JAG    Culture (A)  Final    ESCHERICHIA COLI SUSCEPTIBILITIES TO FOLLOW Performed at [facility], [place] [address] [place], [place] [address]    Report Status PENDING  Incomplete  Blood culture (routine x 2)     Status: Abnormal (Preliminary result)   Collection Time: 04/16/18 12:42 PM  Result Value Ref Range Status   Specimen Description   Final    BLOOD RIGHT HAND Performed at [facility], [place] [address] [place], [place] [address]    Special Requests   Final    BOTTLES DRAWN AEROBIC AND ANAEROBIC Blood Culture results may not be optimal due to an inadequate volume of blood received in culture bottles Performed at [facility], [address] [place], [place] [address]    Culture  Setup Time   Final    GRAM NEGATIVE RODS AEROBIC BOTTLE ONLY CRITICAL VALUE NOTED.  VALUE IS CONSISTENT WITH PREVIOUSLY REPORTED AND CALLED VALUE. JAG Performed at [facility], [address], [place], [place] [address]    Culture (A)  Final    ESCHERICHIA COLI SUSCEPTIBILITIES TO FOLLOW Performed at [facility], [place] [address] [place], [place] [address]    Report Status PENDING  Incomplete  Blood Culture ID Panel (Reflexed)     Status: Abnormal   Collection Time: 04/16/18 12:42 PM  Result Value Ref Range Status   Enterococcus species NOT DETECTED NOT DETECTED Final   Listeria monocytogenes NOT DETECTED NOT DETECTED Final   Staphylococcus species NOT DETECTED NOT DETECTED Final   Staphylococcus aureus NOT DETECTED NOT DETECTED Final   Streptococcus species NOT DETECTED NOT DETECTED Final   Streptococcus agalactiae NOT DETECTED NOT DETECTED Final   Streptococcus pneumoniae NOT DETECTED NOT DETECTED Final   Streptococcus pyogenes NOT DETECTED NOT DETECTED Final   Acinetobacter baumannii NOT DETECTED NOT DETECTED Final   Enterobacteriaceae species DETECTED (A) NOT DETECTED Final    Comment: Enterobacteriaceae represent a large family of gram-negative bacteria, not a single organism. CRITICAL RESULT CALLED TO, READ BACK BY AND VERIFIED WITH: MATT MCBANE ON 04/17/18 AT 0042 JAG    Enterobacter cloacae complex NOT DETECTED NOT DETECTED Final   Escherichia coli DETECTED (A) NOT DETECTED Final    Comment: CRITICAL RESULT CALLED TO, READ BACK BY AND VERIFIED WITH: MATT MCBANE ON 04/17/18 AT 0042 JAG    Klebsiella oxytoca NOT DETECTED NOT DETECTED Final   Klebsiella pneumoniae NOT DETECTED NOT DETECTED Final   Proteus species NOT DETECTED NOT DETECTED Final   Serratia marcescens NOT DETECTED NOT DETECTED Final   Carbapenem resistance NOT DETECTED NOT DETECTED Final   Haemophilus influenzae NOT DETECTED NOT DETECTED  Final   Neisseria meningitidis NOT DETECTED NOT DETECTED Final   Pseudomonas aeruginosa NOT DETECTED NOT DETECTED Final   Candida albicans NOT DETECTED NOT DETECTED Final   Candida glabrata NOT DETECTED  NOT DETECTED Final   Candida krusei NOT DETECTED NOT DETECTED Final   Candida parapsilosis NOT DETECTED NOT DETECTED Final   Candida tropicalis NOT DETECTED NOT DETECTED Final    Comment: Performed at [facility], [place] [place], [place] [address]  CULTURE, BLOOD (ROUTINE X 2) w Reflex to ID Panel     Status: None (Preliminary result)   Collection Time: 04/16/18  6:23 PM  Result Value Ref Range Status   Specimen Description BLOOD  R FOREARM   Final   Special Requests   Final    BOTTLES DRAWN AEROBIC AND ANAEROBIC Blood Culture results may not be optimal due to an excessive volume of blood received in culture bottles   Culture   Final    NO GROWTH 2 DAYS Performed at [facility], [address] [place], [place] [address]    Report Status PENDING  Incomplete  CULTURE, BLOOD (ROUTINE X 2) w Reflex to ID Panel     Status: None (Preliminary result)   Collection Time: 04/16/18  6:33 PM  Result Value Ref Range Status   Specimen Description BLOOD  L WRIST  Final   Special Requests   Final    BOTTLES DRAWN AEROBIC AND ANAEROBIC Blood Culture results may not be optimal due to an excessive volume of blood received in culture bottles   Culture   Final    NO GROWTH 2 DAYS Performed at [facility], [address] [place], [place] [address]    Report Status PENDING  Incomplete  MRSA PCR Screening     Status: None   Collection Time: 04/17/18  8:50 AM  Result Value Ref Range Status   MRSA by PCR NEGATIVE NEGATIVE Final    Comment:        The GeneXpert MRSA Assay (FDA approved for NASAL specimens only), is one component of a comprehensive MRSA colonization surveillance program. It is not intended to diagnose MRSA infection nor to guide or monitor  treatment for MRSA infections. Performed at [facility], [address], [place], [place] [address]   Urine Culture     Status: Abnormal (Preliminary result)   Collection Time: 04/17/18  1:24 PM  Result Value Ref Range Status   Specimen Description   Final    URINE, RANDOM Performed at [facility], [address] [place], [place] [address]    Special Requests   Final    NONE Performed at [facility], [place], [place] [address]    Culture 10,000 COLONIES/mL ESCHERICHIA COLI (A)  Final   Report Status PENDING  Incomplete    Coagulation Studies: No results for input(s): LABPROT, INR in the last 72 hours.  Urinalysis: No results for input(s): COLORURINE, LABSPEC, PHURINE, GLUCOSEU, HGBUR, BILIRUBINUR, KETONESUR, PROTEINUR, UROBILINOGEN, NITRITE, LEUKOCYTESUR in the last 72 hours.  Invalid input(s): APPERANCEUR    Imaging: US Renal  Result Date: 04/17/2018 CLINICAL DATA:  Acute renal failure EXAM: RENAL / URINARY TRACT ULTRASOUND COMPLETE COMPARISON:  Lumbar MRI Apr 21, 2017 FINDINGS: Right Kidney: Length: 10.4 cm. The right kidney is inferiorly positioned in the right lower quadrant region. Echogenicity and renal cortical thickness are within normal limits. No mass, perinephric fluid, or hydronephrosis visualized. No sonographically demonstrable calculus or ureterectasis. Left Kidney: Length: 13.4 cm. Echogenicity and renal cortical thickness are within normal limits. No perinephric fluid or hydronephrosis visualized. There is a left upper pole parapelvic cyst measuring 2.2 x 1.0 x 2.4 cm. No sonographically demonstrable  calculus or ureterectasis. Bladder: Appears normal for degree of bladder distention. There is a cyst arising from the right ovary measuring 3.8 x 2.5 x 2.9 cm. IMPRESSION: 1. 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 2.  Upper pole parapelvic cyst left kidney, also seen  on prior MR. 3. No obstructing focus in either kidney. No renal cortical thinning or increased echogenicity in either kidney. 4. Question size discrepancy between kidneys. Given the rotation of the right kidney, this finding may be spurious. The possibility of renal artery stenosis on the right must be question given apparent size discrepancy between kidneys. In this regard, question whether patient is hypertensive. 5. Incidental note is made of right ovarian cyst measuring 3.8 x 2.5 x 2.9 cm. Electronically Signed   By: Lowella Grip III M.D.   On: 04/17/2018 10:05     Medications:   . meropenem (MERREM) IV Stopped (04/18/18 0618)  . sodium chloride (hypertonic) 25 mL/hr at 04/18/18 0800   . aspirin  81 mg Oral Daily  . atorvastatin  40 mg Oral q1800  . buPROPion  150 mg Oral Daily  . carisoprodol  350 mg Oral QHS  . cholecalciferol  1,000 Units Oral Daily  . citalopram  20 mg Oral Daily  . fluticasone  2 spray Each Nare Daily  . [START ON 04/19/2018] fluticasone furoate-vilanterol  1 puff Inhalation Daily  . heparin injection (subcutaneous)  5,000 Units Subcutaneous Q8H  . ipratropium-albuterol  3 mL Nebulization Q6H  . levothyroxine  112 mcg Oral QAC breakfast  . multivitamin with minerals  1 tablet Oral Daily  . pantoprazole  40 mg Oral Daily  . sodium bicarbonate  650 mg Oral BID  . vitamin B-12  100 mcg Oral Daily   acetaminophen **OR** acetaminophen, ondansetron **OR** ondansetron (ZOFRAN) IV, polyethylene glycol  Assessment/ Plan:  [name] is a 72 y.o. white female [name] is a 72 y.o. white female with COPD, coronary artery disease, depression, GERD, hyperlipidemia, hypothyroidism, who was admitted to [facility] on 04/16/2018  1. Acute Renal Failure: ATN 2. Hyponatremia 3. Metabolic Acidosis 4. Hypertension  Plan Hypovolemic hyponatremia with prerenal azotemia.  - Continue hypertonic saline.  - Fluid restriction - Start sodium bicarbonate   LOS:  2 Jodi Beasley 5/15/201912:24 PM

## 2018-04-19 LAB — CBC
HCT: 32.5 % — ABNORMAL LOW (ref 35.0–47.0)
Hemoglobin: 11.5 g/dL — ABNORMAL LOW (ref 12.0–16.0)
MCH: 30.6 pg (ref 26.0–34.0)
MCHC: 35.4 g/dL (ref 32.0–36.0)
MCV: 86.4 fL (ref 80.0–100.0)
PLATELETS: 172 10*3/uL (ref 150–440)
RBC: 3.76 MIL/uL — ABNORMAL LOW (ref 3.80–5.20)
RDW: 15 % — AB (ref 11.5–14.5)
WBC: 20.1 10*3/uL — AB (ref 3.6–11.0)

## 2018-04-19 LAB — CULTURE, BLOOD (ROUTINE X 2): Special Requests: ADEQUATE

## 2018-04-19 LAB — BASIC METABOLIC PANEL
Anion gap: 12 (ref 5–15)
BUN: 62 mg/dL — ABNORMAL HIGH (ref 6–20)
CALCIUM: 8.1 mg/dL — AB (ref 8.9–10.3)
CHLORIDE: 98 mmol/L — AB (ref 101–111)
CO2: 15 mmol/L — ABNORMAL LOW (ref 22–32)
CREATININE: 4.42 mg/dL — AB (ref 0.44–1.00)
GFR, EST AFRICAN AMERICAN: 11 mL/min — AB (ref >60)
GFR, EST NON AFRICAN AMERICAN: 9 mL/min — AB (ref >60)
Glucose, Bld: 145 mg/dL — ABNORMAL HIGH (ref 65–99)
Potassium: 4 mmol/L (ref 3.5–5.1)
SODIUM: 125 mmol/L — AB (ref 135–145)

## 2018-04-19 LAB — SODIUM
SODIUM: 123 mmol/L — AB (ref 135–145)
SODIUM: 124 mmol/L — AB (ref 135–145)
SODIUM: 126 mmol/L — AB (ref 135–145)

## 2018-04-19 LAB — URINE CULTURE

## 2018-04-19 MED ORDER — IPRATROPIUM-ALBUTEROL 0.5-2.5 (3) MG/3ML IN SOLN
3.0000 mL | Freq: Three times a day (TID) | RESPIRATORY_TRACT | Status: DC
  Filled 2018-04-19: qty 3

## 2018-04-19 MED ORDER — ALBUTEROL SULFATE (2.5 MG/3ML) 0.083% IN NEBU: 2.5000 mg | INHALATION_SOLUTION | RESPIRATORY_TRACT | Status: DC | PRN

## 2018-04-19 MED ORDER — CEFAZOLIN SODIUM-DEXTROSE 1-4 GM/50ML-% IV SOLN
1.0000 g | Freq: Two times a day (BID) | INTRAVENOUS | Status: DC
  Administered 2018-04-19 – 2018-04-21 (×4): 1 g via INTRAVENOUS
  Filled 2018-04-19 (×6): qty 50

## 2018-04-19 MED ORDER — SODIUM CHLORIDE 3 % IV SOLN
INTRAVENOUS | Status: DC
  Administered 2018-04-19: 25 mL/h via INTRAVENOUS
  Filled 2018-04-19: qty 500

## 2018-04-19 NOTE — Progress Notes (Signed)
[facility]  ROUNDING NOTE   Subjective:   UOP 1300 (875)  CO2 15 (17)  Creatinine 4.42 (4.23) (3.83)  On 3% saline at 34mL/hr  Blood cultures growing E. coli - meropenem  Objective:  Vital signs in last 24 hours:  Temp:  [97.5 F (36.4 C)-98.7 F (37.1 C)] 97.5 F (36.4 C) (05/16 0757) Pulse Rate:  [43-89] 43 (05/16 0757) Resp:  [18-20] 18 (05/16 0757) BP: (131-160)/(59-92) 138/59 (05/16 0757) SpO2:  [91 %-97 %] 94 % (05/16 0757) Weight:  [77.6 kg (171 lb)] 77.6 kg (171 lb) (05/16 0500)  Weight change:  Filed Weights   04/16/18 1500 04/17/18 0500 04/19/18 0500  Weight: 72.3 kg (159 lb 6.3 oz) 75 kg (165 lb 5.5 oz) 77.6 kg (171 lb)    Intake/Output: I/O last 3 completed shifts: In: 2422.9 [P.O.:1400; I.V.:722.9; IV Piggyback:300] Out: 1300 [Urine:1300]   Intake/Output this shift:  Total I/O In: 440 [P.O.:440] Out: 400 [Urine:400]  Physical Exam: General: NAD, laying in bed  Head: Normocephalic, atraumatic. Moist oral mucosal membranes  Eyes: Anicteric, PERRL  Neck: Supple, trachea midline  Lungs:  Clear to auscultation  Heart: Regular rate and rhythm  Abdomen:  Soft, nontender,   Extremities: no peripheral edema.  Neurologic: Nonfocal, moving all four extremities  Skin: No lesions        Basic Metabolic Panel: Recent Labs  Lab 04/16/18 1055  04/17/18 0547  04/18/18 0554 04/18/18 1156 04/18/18 1819 04/19/18 0003 04/19/18 0640  NA 115*   < > 119*   < > 123* 122* 122* 123* 125*  K 3.9  --  3.7  --  3.6  --   --   --  4.0  CL 83*  --  89*  --  94*  --   --   --  98*  CO2 17*  --  19*  --  17*  --   --   --  15*  GLUCOSE 148*  --  93  --  93  --   --   --  145*  BUN 47*  --  50*  --  56*  --   --   --  62*  CREATININE 3.23*  --  3.83*  --  4.23*  --   --   --  4.42*  CALCIUM 8.4*  --  7.6*  --  8.0*  --   --   --  8.1*   < > = values in this interval not displayed.    Liver Function Tests: Recent Labs  Lab 04/16/18 1055  AST  44*  ALT 28  ALKPHOS 95  BILITOT 1.1  PROT 7.0  ALBUMIN 3.0*   Recent Labs  Lab 04/16/18 1055  LIPASE 21   No results for input(s): AMMONIA in the last 168 hours.  CBC: Recent Labs  Lab 04/16/18 1055 04/17/18 0547 04/18/18 0554 04/19/18 0640  WBC 30.2* 21.2* 17.1* 20.1*  NEUTROABS 27.6*  --   --   --   HGB 13.8 11.4* 11.7* 11.5*  HCT 39.7 33.7* 34.2* 32.5*  MCV 86.1 88.4 88.4 86.4  PLT 136* 120* 124* 172    Cardiac Enzymes: No results for input(s): CKTOTAL, CKMB, CKMBINDEX, TROPONINI in the last 168 hours.  BNP: Invalid input(s): POCBNP  CBG: Recent Labs  Lab 04/16/18 1410  GLUCAP 115*    Microbiology: Results for orders placed or performed during the hospital encounter of 04/16/18  Blood culture (routine x 2)     Status:  Abnormal   Collection Time: 04/16/18 12:42 PM  Result Value Ref Range Status   Specimen Description   Final    BLOOD RIGHT ANTECUBITAL Performed at [facility], [place] [address], [place], [place] [address]    Special Requests   Final    BOTTLES DRAWN AEROBIC AND ANAEROBIC Blood Culture adequate volume Performed at [place] [place], [place] [address]    Culture  Setup Time   Final    GRAM NEGATIVE RODS AEROBIC BOTTLE ONLY CRITICAL RESULT CALLED TO, READ BACK BY AND VERIFIED WITH: MATT MCBANE ON 04/17/18 AT 0042 JAG Performed at [facility], [place] [address] [place], [place] [address]    Culture ESCHERICHIA COLI (A)  Final   Report Status 04/19/2018 FINAL  Final   Organism ID, Bacteria ESCHERICHIA COLI  Final      Susceptibility   Escherichia coli - MIC*    AMPICILLIN 8 SENSITIVE Sensitive     CEFAZOLIN <=4 SENSITIVE Sensitive     CEFEPIME <=1 SENSITIVE Sensitive     CEFTAZIDIME <=1 SENSITIVE Sensitive     CEFTRIAXONE <=1 SENSITIVE Sensitive     CIPROFLOXACIN <=0.25 SENSITIVE Sensitive     GENTAMICIN <=1 SENSITIVE Sensitive     IMIPENEM <=0.25 SENSITIVE Sensitive     TRIMETH/SULFA <=20  SENSITIVE Sensitive     AMPICILLIN/SULBACTAM 4 SENSITIVE Sensitive     PIP/TAZO <=4 SENSITIVE Sensitive     Extended ESBL NEGATIVE Sensitive     * ESCHERICHIA COLI  Blood culture (routine x 2)     Status: Abnormal (Preliminary result)   Collection Time: 04/16/18 12:42 PM  Result Value Ref Range Status   Specimen Description   Final    BLOOD RIGHT HAND Performed at [place] [address], [place], [place] [address]    Special Requests   Final    BOTTLES DRAWN AEROBIC AND ANAEROBIC Blood Culture results may not be optimal due to an inadequate volume of blood received in culture bottles Performed at [facility], [address] [place], [place] [address]    Culture  Setup Time   Final    GRAM NEGATIVE RODS IN BOTH AEROBIC AND ANAEROBIC BOTTLES CRITICAL VALUE NOTED.  VALUE IS CONSISTENT WITH PREVIOUSLY REPORTED AND CALLED VALUE. JAG Performed at [facility], [address] [place], [place] [address]    Culture (A)  Final    ESCHERICHIA COLI SUSCEPTIBILITIES PERFORMED ON PREVIOUS CULTURE WITHIN THE LAST 5 DAYS. Performed at [facility], [place] [address], [place], [place] [address]    Report Status PENDING  Incomplete  Blood Culture ID Panel (Reflexed)     Status: Abnormal   Collection Time: 04/16/18 12:42 PM  Result Value Ref Range Status   Enterococcus species NOT DETECTED NOT DETECTED Final   Listeria monocytogenes NOT DETECTED NOT DETECTED Final   Staphylococcus species NOT DETECTED NOT DETECTED Final   Staphylococcus aureus NOT DETECTED NOT DETECTED Final   Streptococcus species NOT DETECTED NOT DETECTED Final   Streptococcus agalactiae NOT DETECTED NOT DETECTED Final   Streptococcus pneumoniae NOT DETECTED NOT DETECTED Final   Streptococcus pyogenes NOT DETECTED NOT DETECTED Final   Acinetobacter baumannii NOT DETECTED NOT DETECTED Final   Enterobacteriaceae species DETECTED (A) NOT DETECTED Final    Comment: Enterobacteriaceae  represent a large family of gram-negative bacteria, not a single organism. CRITICAL RESULT CALLED TO, READ BACK BY AND VERIFIED WITH: MATT MCBANE ON 04/17/18 AT 8185  JAG    Enterobacter cloacae complex NOT DETECTED NOT DETECTED Final   Escherichia coli DETECTED (A) NOT DETECTED Final    Comment: CRITICAL RESULT CALLED TO, READ BACK BY AND VERIFIED WITH: MATT MCBANE ON 04/17/18 AT 0042 JAG    Klebsiella oxytoca NOT DETECTED NOT DETECTED Final   Klebsiella pneumoniae NOT DETECTED NOT DETECTED Final   Proteus species NOT DETECTED NOT DETECTED Final   Serratia marcescens NOT DETECTED NOT DETECTED Final   Carbapenem resistance NOT DETECTED NOT DETECTED Final   Haemophilus influenzae NOT DETECTED NOT DETECTED Final   Neisseria meningitidis NOT DETECTED NOT DETECTED Final   Pseudomonas aeruginosa NOT DETECTED NOT DETECTED Final   Candida albicans NOT DETECTED NOT DETECTED Final   Candida glabrata NOT DETECTED NOT DETECTED Final   Candida krusei NOT DETECTED NOT DETECTED Final   Candida parapsilosis NOT DETECTED NOT DETECTED Final   Candida tropicalis NOT DETECTED NOT DETECTED Final    Comment: Performed at [facility], [address] [place], [place] [address]  CULTURE, BLOOD (ROUTINE X 2) w Reflex to ID Panel     Status: None (Preliminary result)   Collection Time: 04/16/18  6:23 PM  Result Value Ref Range Status   Specimen Description BLOOD  R FOREARM   Final   Special Requests   Final    BOTTLES DRAWN AEROBIC AND ANAEROBIC Blood Culture results may not be optimal due to an excessive volume of blood received in culture bottles   Culture   Final    NO GROWTH 3 DAYS Performed at [facility], [address], [place], [place] [address]    Report Status PENDING  Incomplete  CULTURE, BLOOD (ROUTINE X 2) w Reflex to ID Panel     Status: None (Preliminary result)   Collection Time: 04/16/18  6:33 PM  Result Value Ref Range Status   Specimen Description BLOOD  L WRIST   Final   Special Requests   Final    BOTTLES DRAWN AEROBIC AND ANAEROBIC Blood Culture results may not be optimal due to an excessive volume of blood received in culture bottles   Culture   Final    NO GROWTH 3 DAYS Performed at [facility], [address] [place], [place] [address]    Report Status PENDING  Incomplete  MRSA PCR Screening     Status: None   Collection Time: 04/17/18  8:50 AM  Result Value Ref Range Status   MRSA by PCR NEGATIVE NEGATIVE Final    Comment:        The GeneXpert MRSA Assay (FDA approved for NASAL specimens only), is one component of a comprehensive MRSA colonization surveillance program. It is not intended to diagnose MRSA infection nor to guide or monitor treatment for MRSA infections. Performed at [facility], [address] [place], [place] [address]   Urine Culture     Status: Abnormal   Collection Time: 04/17/18  1:24 PM  Result Value Ref Range Status   Specimen Description   Final    URINE, RANDOM Performed at [facility], [address], [place], [place] [address]    Special Requests   Final    NONE Performed at [facility], [place], [place] [address]    Culture 10,000 COLONIES/mL ESCHERICHIA COLI (A)  Final   Report Status 04/19/2018 FINAL  Final   Organism ID, Bacteria ESCHERICHIA COLI (A)  Final      Susceptibility   Escherichia coli -  MIC*    AMPICILLIN 8 SENSITIVE Sensitive     CEFAZOLIN <=4 SENSITIVE Sensitive     CEFTRIAXONE <=1 SENSITIVE Sensitive     CIPROFLOXACIN <=0.25 SENSITIVE Sensitive     GENTAMICIN <=1 SENSITIVE Sensitive     IMIPENEM <=0.25 SENSITIVE Sensitive     NITROFURANTOIN <=16 SENSITIVE Sensitive     TRIMETH/SULFA <=20 SENSITIVE Sensitive     AMPICILLIN/SULBACTAM 4 SENSITIVE Sensitive     PIP/TAZO <=4 SENSITIVE Sensitive     Extended ESBL NEGATIVE Sensitive     * 10,000 COLONIES/mL ESCHERICHIA COLI    Coagulation Studies: No results for  input(s): LABPROT, INR in the last 72 hours.  Urinalysis: No results for input(s): COLORURINE, LABSPEC, PHURINE, GLUCOSEU, HGBUR, BILIRUBINUR, KETONESUR, PROTEINUR, UROBILINOGEN, NITRITE, LEUKOCYTESUR in the last 72 hours.  Invalid input(s): APPERANCEUR    Imaging: No results found.   Medications:   . meropenem (MERREM) IV Stopped (04/19/18 8115)  . sodium chloride (hypertonic) 25 mL/hr (04/18/18 1803)   . aspirin  81 mg Oral Daily  . atorvastatin  40 mg Oral q1800  . buPROPion  150 mg Oral Daily  . carisoprodol  350 mg Oral QHS  . cholecalciferol  1,000 Units Oral Daily  . citalopram  20 mg Oral Daily  . fluticasone  2 spray Each Nare Daily  . fluticasone furoate-vilanterol  1 puff Inhalation Daily  . heparin injection (subcutaneous)  5,000 Units Subcutaneous Q8H  . ipratropium-albuterol  3 mL Nebulization Q6H  . levothyroxine  112 mcg Oral QAC breakfast  . multivitamin with minerals  1 tablet Oral Daily  . pantoprazole  40 mg Oral Daily  . sodium bicarbonate  650 mg Oral BID  . vitamin B-12  100 mcg Oral Daily   acetaminophen **OR** acetaminophen, ondansetron **OR** ondansetron (ZOFRAN) IV, polyethylene glycol  Assessment/ Plan:  [name] is a 71 y.o. white female [name] is a 72 y.o. white female with COPD, coronary artery disease, depression, GERD, hyperlipidemia, hypothyroidism, who was admitted to [facility] on 04/16/2018  1. Acute Renal Failure: ATN 2. Hyponatremia 3. Metabolic Acidosis 4. Hypertension  Plan Hypovolemic hyponatremia with prerenal azotemia.  - Continue hypertonic saline for one more day - Fluid restriction - continue sodium bicarbonate   LOS: 3 Travia Onstad 5/16/20191:06 PM

## 2018-04-19 NOTE — Care Management Important Message (Signed)
Important Message  Patient Details  Name: Jodi Beasley MRN: [id number] Date of Birth: 1946/08/18   Medicare Important Message Given:  Yes    Juliann Pulse A Annete Ayuso 04/19/2018, 11:39 AM

## 2018-04-19 NOTE — Care Management Note (Addendum)
Case Management Note  Patient Details  Name: Jodi Beasley MRN: [id number] Date of Birth: [birth year]  Subjective/Objective:   Met with patient at bedside to discuss discharge planning. She lives alone but has family and friends available 24 hours per day if needed. She will need a walker. Ordered from [facility]. Offered a list of home care providers. She prefers [facility]. Referral to [facility] with [facility] for RN and PT.  PCP is Dr. Caryl Bis. Will follow progression.               Action/Plan: [facility] for walker, RN and PT.   Expected Discharge Date:                  Expected Discharge Plan:  [place]  In-House Referral:     Discharge planning Services  CM Consult  Post Acute Care Choice:  Durable Medical Equipment, Home Health Choice offered to:  Patient  DME Arranged:  Walker rolling DME Agency:  [place] Arranged:  RN, PT [facility] Agency:  [place]  Status of Service:  In process, will continue to follow  If discussed at Long Length of Stay Meetings, dates discussed:    Additional Comments:  Jolly Mango, RN 04/19/2018, 3:49 PM

## 2018-04-19 NOTE — Evaluation (Signed)
Physical Therapy Evaluation Patient Details Name: Jodi Beasley MRN: [id number] DOB: 02-17-1946 Today's Date: 04/19/2018   History of Present Illness  Pt is a 72 y/o F who presented due to generalzied weakness and several falls and diarrhea.  Pt was admitted with severe hyponatremia in the context of diarrhea.  Pt with community acquired pneumonia.  Pt's PMH includes COPD, arrhythmia, syncope, MI.     Clinical Impression  Pt admitted with above diagnosis. Pt currently with functional limitations due to the deficits listed below (see PT Problem List). Pt independent at baseline.  Ms. Boutin presents with BLE strength grossly 4-/5 and requires as least 1UE support when ambulating due to weakness and fatigue. Pt ambulated 100 ft, taking several rest breaks due to fatigue.  Pt has very good family/friend support should she need assist at d/c. Pt will benefit from skilled PT to increase their independence and safety with mobility to allow discharge to the venue listed below.      Follow Up Recommendations Home health PT    Equipment Recommendations  Rolling walker with 5" wheels    Recommendations for Other Services       Precautions / Restrictions Precautions Precautions: Fall;Other (comment) Precaution Comments: Monitor O2 Restrictions Weight Bearing Restrictions: No      Mobility  Bed Mobility Overal bed mobility: Modified Independent             General bed mobility comments: Increased time and effort but no physical assist or cues needed  Transfers Overall transfer level: Needs assistance Equipment used: Rolling walker (2 wheeled) Transfers: Sit to/from Stand Sit to Stand: Min guard         General transfer comment: Cues for proper hand placement using RW.  Pt slow to stand.  Pt requires cues to keep RW with her when backing up to chair to sit.   Ambulation/Gait Ambulation/Gait assistance: Min guard Ambulation Distance (Feet): 100 Feet Assistive device: Rolling  walker (2 wheeled) Gait Pattern/deviations: Decreased step length - left;Decreased step length - right Gait velocity: decreased   General Gait Details: Pt with decreased gait speed and requires several standing rest breaks due to fatigue.  Pt saying, "I need to catch my breath".  SpO2 91% on RA at end of ambulation.    Stairs            Wheelchair Mobility    Modified Rankin (Stroke Patients Only)       Balance Overall balance assessment: Needs assistance;History of Falls Sitting-balance support: No upper extremity supported;Feet supported Sitting balance-Leahy Scale: Good     Standing balance support: No upper extremity supported;During functional activity Standing balance-Leahy Scale: Fair Standing balance comment: Pt able to stand statically without UE support but relies on at least 1UE support for dynamic activities                             Pertinent Vitals/Pain Pain Assessment: No/denies pain    Home Living Family/patient expects to be discharged to:: Private residence Living Arrangements: Alone Available Help at Discharge: Friend(s);Family;Available 24 hours/day(if needed) Type of Home: House Home Access: Stairs to enter Entrance Stairs-Rails: None Entrance Stairs-Number of Steps: 1 Home Layout: One level Home Equipment: Grab bars - tub/shower;Shower seat - built in      Prior Function Level of Independence: Independent         Comments: Pt ambulates without AD at baseline.  Had 2-3 falls/day for the few days leading up  to admission but denies any other falls in the past 6 months.  Pt independent with all ADLs, IADLs, driving.      Hand Dominance        Extremity/Trunk Assessment   Upper Extremity Assessment Upper Extremity Assessment: (BUE strength grossly 4/5)    Lower Extremity Assessment Lower Extremity Assessment: (BLE strength grossly 4-/5)       Communication   Communication: No difficulties  Cognition  Arousal/Alertness: Awake/alert Behavior During Therapy: WFL for tasks assessed/performed Overall Cognitive Status: Within Functional Limits for tasks assessed                                        General Comments      Exercises Other Exercises Other Exercises: Encouraged pt to ambulate at least 3x/day with nursing staff   Assessment/Plan    PT Assessment Patient needs continued PT services  PT Problem List Decreased strength;Decreased activity tolerance;Decreased balance;Decreased knowledge of use of DME;Decreased safety awareness       PT Treatment Interventions DME instruction;Gait training;Stair training;Functional mobility training;Therapeutic activities;Therapeutic exercise;Balance training;Neuromuscular re-education;Patient/family education    PT Goals (Current goals can be found in the Care Plan section)  Acute Rehab PT Goals Patient Stated Goal: to return home at d/c PT Goal Formulation: With patient Time For Goal Achievement: 05/03/18 Potential to Achieve Goals: Good    Frequency Min 2X/week   Barriers to discharge        Co-evaluation               AM-PAC PT "6 Clicks" Daily Activity  Outcome Measure Difficulty turning over in bed (including adjusting bedclothes, sheets and blankets)?: A Little Difficulty moving from lying on back to sitting on the side of the bed? : A Little Difficulty sitting down on and standing up from a chair with arms (e.g., wheelchair, bedside commode, etc,.)?: A Little Help needed moving to and from a bed to chair (including a wheelchair)?: A Little Help needed walking in hospital room?: A Little Help needed climbing 3-5 steps with a railing? : A Little 6 Click Score: 18    End of Session Equipment Utilized During Treatment: Gait belt Activity Tolerance: Patient tolerated treatment well;Patient limited by fatigue Patient left: in chair;with call bell/phone within reach;with chair alarm set Nurse  Communication: Mobility status;Other (comment)(SpO2) PT Visit Diagnosis: Muscle weakness (generalized) (M62.81);Unsteadiness on feet (R26.81)    Time: 7035-0093 PT Time Calculation (min) (ACUTE ONLY): 24 min   Charges:   PT Evaluation $PT Eval Low Complexity: 1 Low PT Treatments $Gait Training: 8-22 mins   PT G Codes:        Collie Siad PT, DPT 04/19/2018, 3:33 PM

## 2018-04-19 NOTE — Progress Notes (Addendum)
[place] for Hypertonic Saline 3%  Indication: Hyponatremia   Allergies  Allergen Reactions  . Benzocaine     Tongue swelling  . Darvon [Propoxyphene Hcl]     HA  . Monosodium Glutamate Diarrhea  . Neosporin [Neomycin-Bacitracin Zn-Polymyx]     blisters  . Nicoderm [Nicotine]     arrythmia  . Prednisone     Cannot tolerate high doses per patient- caused depressive thoughts   Intake/Output from previous day: 05/15 0701 - 05/16 0700 In: 1757.9 [P.O.:1160; I.V.:397.9; IV Piggyback:200] Out: 1300 [Urine:1300] Intake/Output from this shift: Total I/O In: 1270 [P.O.:920; I.V.:350] Out: 400 [Urine:400]  Labs: Recent Labs    04/17/18 0547 04/18/18 0554 04/19/18 0640  WBC 21.2* 17.1* 20.1*  HGB 11.4* 11.7* 11.5*  HCT 33.7* 34.2* 32.5*  PLT 120* 124* 172  CREATININE 3.83* 4.23* 4.42*   Estimated Creatinine Clearance: 12.3 mL/min (A) (by C-G formula based on SCr of 4.42 mg/dL (H)).   Assessment: 72 year old female presents with hyponatremia. Patient started on hypertonic saline 3% 5/14 with a Na level 118.   Day 3 on hypertonic saline 3%, sodium level 124.    Plan:  Will continue on hypertonic saline 3% @ 50ml/hr.  Will continue to check Na levels every 6 hours.   Candelaria Stagers, PharmD Pharmacy Resident  04/19/2018,3:12 PM

## 2018-04-19 NOTE — Progress Notes (Signed)
[place] [facility] [place] NAME: Jodi Beasley    MR#:  [id number]  DATE OF BIRTH:  Jul 03, 1946  SUBJECTIVE:  CHIEF COMPLAINT:   Chief Complaint  Patient presents with  . Nausea  . Weakness    recently received Doxycycline, and had diarrhea- came with generalized weakness- found to have ac renal failure. Also noted to have bacteremia.  REVIEW OF SYSTEMS:  CONSTITUTIONAL: No fever,have fatigue or weakness.  EYES: No blurred or double vision.  EARS, NOSE, AND THROAT: No tinnitus or ear pain.  RESPIRATORY: No cough, shortness of breath, wheezing or hemoptysis.  CARDIOVASCULAR: No chest pain, orthopnea, edema.  GASTROINTESTINAL: No nausea, vomiting,have diarrhea ,no abdominal pain.  GENITOURINARY: No dysuria, hematuria.  ENDOCRINE: No polyuria, nocturia,  HEMATOLOGY: No anemia, easy bruising or bleeding SKIN: No rash or lesion. MUSCULOSKELETAL: No joint pain or arthritis.   NEUROLOGIC: No tingling, numbness, weakness.  PSYCHIATRY: No anxiety or depression.   ROS  DRUG ALLERGIES:   Allergies  Allergen Reactions  . Benzocaine     Tongue swelling  . Darvon [Propoxyphene Hcl]     HA  . Monosodium Glutamate Diarrhea  . Neosporin [Neomycin-Bacitracin Zn-Polymyx]     blisters  . Nicoderm [Nicotine]     arrythmia  . Prednisone     Cannot tolerate high doses per patient- caused depressive thoughts    VITALS:  Blood pressure (!) 138/59, pulse 95, temperature (!) 97.5 F (36.4 C), temperature source Oral, resp. rate 18, height 5\' 6"  (1.676 m), weight 77.6 kg (171 lb), SpO2 99 %.  PHYSICAL EXAMINATION:  GENERAL:  72 y.o.-year-old patient lying in the bed with no acute distress.  EYES: Pupils equal, round, reactive to light and accommodation. No scleral icterus. Extraocular muscles intact.  HEENT: Head atraumatic, normocephalic. Oropharynx and nasopharynx clear.  NECK:  Supple, no jugular venous distention. No thyroid enlargement, no  tenderness.  LUNGS: Normal breath sounds bilaterally, no wheezing, rales,rhonchi or crepitation. No use of accessory muscles of respiration.  CARDIOVASCULAR: S1, S2 normal. No murmurs, rubs, or gallops.  ABDOMEN: Soft, nontender, nondistended. Bowel sounds present. No organomegaly or mass.  EXTREMITIES: No pedal edema, cyanosis, or clubbing.  NEUROLOGIC: Cranial nerves II through XII are intact. Muscle strength 4/5 in all extremities. Sensation intact. Gait not checked.  PSYCHIATRIC: The patient is alert and oriented x 3.  SKIN: No obvious rash, lesion, or ulcer.   Physical Exam LABORATORY PANEL:   CBC Recent Labs  Lab 04/19/18 0640  WBC 20.1*  HGB 11.5*  HCT 32.5*  PLT 172   ------------------------------------------------------------------------------------------------------------------  Chemistries  Recent Labs  Lab 04/16/18 1055  04/19/18 0640 04/19/18 1243  NA 115*   < > 125* 124*  K 3.9   < > 4.0  --   CL 83*   < > 98*  --   CO2 17*   < > 15*  --   GLUCOSE 148*   < > 145*  --   BUN 47*   < > 62*  --   CREATININE 3.23*   < > 4.42*  --   CALCIUM 8.4*   < > 8.1*  --   AST 44*  --   --   --   ALT 28  --   --   --   ALKPHOS 95  --   --   --   BILITOT 1.1  --   --   --    < > = values in this  interval not displayed.   ------------------------------------------------------------------------------------------------------------------  Cardiac Enzymes No results for input(s): TROPONINI in the last 168 hours. ------------------------------------------------------------------------------------------------------------------  RADIOLOGY:  No results found.  ASSESSMENT AND PLAN:   Active Problems:   Hyponatremia *  Severe hyponatremia in the context of diarrhea   Hypovolemic hyponatremia On IV fluids NS- Sodium level every 6 hours- not improved much. Normal TSH, urine sodium <10 Nephrology consultation appreciated, suggested IV fluids. started on Hypertonic saline  and on fluid restriction as she was drinking a lot of liquids.  Advised to cont one more day.  *  Acute kidney injury in the setting of dehydration with diarrhea Continue IV fluids and repeat BMP in a.m. Hold nephrotoxic medications Nephrology consultation appreciated.   Get Renal US- no blockages.  having urine output, worsening renal func.  * bacteremia   On IV meropenem.   Follow bl cx report. Change to keflex.  *  Community-acquired pneumonia with leukocytosis:   Given Rocephin and azithromycin  Follow-up on blood cultures  Have bacteremia- changed Abx.  *  CAD: Continue aspirin, statin, metoprolol  *  Diarrhea: Check C. Difficile  now don't have diarrhea , so don't need to check.  *  Hypothyroidism: Continue Synthroid.  * COPD without signs of exacerbation.  *  Depression: Continue Celexa.  * Tobacco dependence: Patient is encouraged to quit smoking. Counseling was provided for 4 minutes.  All the records are reviewed and case discussed with Care Management/Social Workerr. Management plans discussed with the patient, family and they are in agreement.  CODE STATUS: full.  TOTAL TIME TAKING CARE OF THIS PATIENT: 35 minutes.   POSSIBLE D/C IN 2-3 DAYS, DEPENDING ON CLINICAL CONDITION.   Vaughan Basta M.D on 04/19/2018   Between 7am to 6pm - Pager - [phone]  After 6pm go to [email or web address] - password [email or web address] [facility]  Office  [phone]  CC: Primary care physician; Leone Haven, MD  Note: This dictation was prepared with Dragon dictation along with smaller phrase technology. Any transcriptional errors that result from this process are unintentional.

## 2018-04-20 LAB — CBC
HEMATOCRIT: 31.1 % — AB (ref 35.0–47.0)
Hemoglobin: 10.6 g/dL — ABNORMAL LOW (ref 12.0–16.0)
MCH: 29.6 pg (ref 26.0–34.0)
MCHC: 34.1 g/dL (ref 32.0–36.0)
MCV: 86.9 fL (ref 80.0–100.0)
Platelets: 207 10*3/uL (ref 150–440)
RBC: 3.59 MIL/uL — ABNORMAL LOW (ref 3.80–5.20)
RDW: 15 % — AB (ref 11.5–14.5)
WBC: 21.1 10*3/uL — AB (ref 3.6–11.0)

## 2018-04-20 LAB — BASIC METABOLIC PANEL
Anion gap: 11 (ref 5–15)
BUN: 64 mg/dL — AB (ref 6–20)
CHLORIDE: 101 mmol/L (ref 101–111)
CO2: 15 mmol/L — ABNORMAL LOW (ref 22–32)
CREATININE: 4.52 mg/dL — AB (ref 0.44–1.00)
Calcium: 8.1 mg/dL — ABNORMAL LOW (ref 8.9–10.3)
GFR calc Af Amer: 10 mL/min — ABNORMAL LOW (ref >60)
GFR calc non Af Amer: 9 mL/min — ABNORMAL LOW (ref >60)
GLUCOSE: 126 mg/dL — AB (ref 65–99)
POTASSIUM: 3.6 mmol/L (ref 3.5–5.1)
SODIUM: 127 mmol/L — AB (ref 135–145)

## 2018-04-20 LAB — SODIUM
SODIUM: 126 mmol/L — AB (ref 135–145)
Sodium: 127 mmol/L — ABNORMAL LOW (ref 135–145)
Sodium: 127 mmol/L — ABNORMAL LOW (ref 135–145)

## 2018-04-20 MED ORDER — METOPROLOL TARTRATE 25 MG PO TABS
25.0000 mg | ORAL_TABLET | Freq: Two times a day (BID) | ORAL | Status: DC
  Administered 2018-04-20 – 2018-04-21 (×2): 25 mg via ORAL
  Filled 2018-04-20 (×2): qty 1

## 2018-04-20 MED ORDER — SODIUM BICARBONATE 650 MG PO TABS
650.0000 mg | ORAL_TABLET | Freq: Three times a day (TID) | ORAL | Status: DC
  Administered 2018-04-20 – 2018-04-21 (×4): 650 mg via ORAL
  Filled 2018-04-20 (×6): qty 1

## 2018-04-20 MED ORDER — IPRATROPIUM-ALBUTEROL 0.5-2.5 (3) MG/3ML IN SOLN
3.0000 mL | Freq: Four times a day (QID) | RESPIRATORY_TRACT | Status: DC | PRN
  Administered 2018-04-20 – 2018-04-21 (×2): 3 mL via RESPIRATORY_TRACT
  Filled 2018-04-20 (×2): qty 3

## 2018-04-20 NOTE — Plan of Care (Signed)

## 2018-04-20 NOTE — Progress Notes (Signed)
Physical Therapy Treatment Patient Details Name: Jodi Beasley MRN: [id number] DOB: 03-24-1946 Today's Date: 04/20/2018    History of Present Illness Pt is a 72 y/o F who presented due to generalzied weakness and several falls and diarrhea.  Pt was admitted with severe hyponatremia in the context of diarrhea.  Pt with community acquired pneumonia.  Pt's PMH includes COPD, arrhythmia, syncope, MI.    PT Comments    Pt made good progress with mobility, ambulating 125 ft with RW and several standing rest breaks due to fatigue.  Pt tolerated standing and seated therapeutic exercises but with fatigue toward end of each set.  Follow up recommendations remain appropriate.    Follow Up Recommendations  Home health PT     Equipment Recommendations  Rolling walker with 5" wheels    Recommendations for Other Services       Precautions / Restrictions Precautions Precautions: Fall Restrictions Weight Bearing Restrictions: No    Mobility  Bed Mobility Overal bed mobility: Modified Independent             General bed mobility comments: Increased time and effort but no physical assist or cues needed  Transfers Overall transfer level: Needs assistance Equipment used: Rolling walker (2 wheeled) Transfers: Sit to/from Stand Sit to Stand: Min guard         General transfer comment: Cues for proper hand placement as pt initially with both hands on RW to stand.  Cues to keep RW with pt as she backs up to sit.   Ambulation/Gait Ambulation/Gait assistance: Min guard Ambulation Distance (Feet): 125 Feet Assistive device: Rolling walker (2 wheeled) Gait Pattern/deviations: Decreased step length - left;Decreased step length - right Gait velocity: decreased   General Gait Details: Pt requires several standing rest breaks due to fatigue but pt with steady gait.  SpO2 remains at or above 96% on RA and HR up to 105 with ambulation.    Stairs             Wheelchair Mobility     Modified Rankin (Stroke Patients Only)       Balance Overall balance assessment: Needs assistance;History of Falls Sitting-balance support: No upper extremity supported;Feet supported Sitting balance-Leahy Scale: Good     Standing balance support: No upper extremity supported;During functional activity Standing balance-Leahy Scale: Fair Standing balance comment: Pt able to stand statically without UE support but relies on at least 1UE support for dynamic activities                            Cognition Arousal/Alertness: Awake/alert Behavior During Therapy: WFL for tasks assessed/performed Overall Cognitive Status: Within Functional Limits for tasks assessed                                        Exercises Other Exercises Other Exercises: Encouraged pt to ambulate at least 3x/day with nursing staff Other Exercises: Standing Bil heel raises x10 with BUE support Other Exercises: Mini squats x10 with BUE support Other Exercises: Standing marching x10 each LE with BUE support Other Exercises: Seated Bil LAQ with 10 second holds x5, fatigue noted at end of each rep    General Comments        Pertinent Vitals/Pain Pain Assessment: No/denies pain    Home Living  Prior Function            PT Goals (current goals can now be found in the care plan section) Acute Rehab PT Goals Patient Stated Goal: to get stronger PT Goal Formulation: With patient Time For Goal Achievement: 05/03/18 Potential to Achieve Goals: Good Progress towards PT goals: Progressing toward goals    Frequency    Min 2X/week      PT Plan Current plan remains appropriate    Co-evaluation              AM-PAC PT "6 Clicks" Daily Activity  Outcome Measure  Difficulty turning over in bed (including adjusting bedclothes, sheets and blankets)?: A Little Difficulty moving from lying on back to sitting on the side of the bed? : A  Little Difficulty sitting down on and standing up from a chair with arms (e.g., wheelchair, bedside commode, etc,.)?: A Little Help needed moving to and from a bed to chair (including a wheelchair)?: A Little Help needed walking in hospital room?: A Little Help needed climbing 3-5 steps with a railing? : A Little 6 Click Score: 18    End of Session Equipment Utilized During Treatment: Gait belt Activity Tolerance: Patient tolerated treatment well;Patient limited by fatigue Patient left: in chair;with call bell/phone within reach;with chair alarm set Nurse Communication: Mobility status;Other (comment)(SpO2) PT Visit Diagnosis: Muscle weakness (generalized) (M62.81);Unsteadiness on feet (R26.81)     Time: 1552-0802 PT Time Calculation (min) (ACUTE ONLY): 26 min  Charges:  $Gait Training: 8-22 mins $Therapeutic Exercise: 8-22 mins                    G Codes:       Collie Siad PT, DPT 04/20/2018, 10:59 AM

## 2018-04-20 NOTE — Consult Note (Signed)
[facility] Infectious Disease     Reason for Consult: E coli bacteremia   Referring Physician: Dolores Frame Date of Admission:  04/16/2018   Active Problems:   Hyponatremia   HPI: Jodi Beasley is a 72 y.o. female history of COPD, CAD and depression who presents to the emergency room due to generalized weakness and diarrhea.  She had been feeling ill for a few days initially with flu like sxs then nocturia then diarrhea and some fevers. She drinks a lot of water she reports. Denies prior wt loss.On admit Na 115, wbc 30, low grade temp. ARF, LFTs nml, UA not done. UCX with 10 k e coli. BCX + ecoli. CXR and renal uss neg. Wbc down to 21 with abx treatment.  Still with some diarrhea.      Past Medical History:  Diagnosis Date  . Arrhythmia   . Chicken pox   . COPD (chronic obstructive pulmonary disease) ([place])   . Coronary artery disease    NSTEMI in 09/2008. Cath : 80% RCA and 95% first diagonal. PCI and 2 DES placement  RCA and 1 DES to diagonal. Complicated by acute diagonal stent thrombosis . Treated by thrombectomy. Most recent nuclear stress test in 2010 showed no ischemia with normal EF.   Marland Kitchen Depression   . GERD (gastroesophageal reflux disease)   . Hay fever   . History of blood transfusion   . History of hypercholesterolemia   . History of syncope 2000   negative EP study  . Hyperlipidemia    intolerance to statins except Crestor.   . Hypertension   . Hypothyroid   . MI (myocardial infarction) ([place])   . Pneumonia, organism unspecified(486)    Past Surgical History:  Procedure Laterality Date  . CORONARY ANGIOPLASTY WITH STENT PLACEMENT  2009   [facility], drug-eluting mid RCA,prox diag;  . TONSILLECTOMY    . TUBAL LIGATION     Social History   Tobacco Use  . Smoking status: Current Every Day Smoker    Packs/day: 1.00    Years: 53.00    Pack years: 53.00    Types: E-cigarettes, Cigarettes    Start date: 11/19/1960  . Smokeless tobacco: Never Used  .  Tobacco comment: down to 0.75ppd  Substance Use Topics  . Alcohol use: Yes    Alcohol/week: 0.6 oz    Types: 1 Standard drinks or equivalent per week  . Drug use: No   Family History  Problem Relation Age of Onset  . Colon cancer Father   . Lung cancer Father        was a former smoker  . Cancer Father        lung  . Diabetes Father   . Hypertension Mother   . Hypertension Unknown   . Diabetes Unknown     Allergies:  Allergies  Allergen Reactions  . Benzocaine     Tongue swelling  . Darvon [Propoxyphene Hcl]     HA  . Monosodium Glutamate Diarrhea  . Neosporin [Neomycin-Bacitracin Zn-Polymyx]     blisters  . Nicoderm [Nicotine]     arrythmia  . Prednisone     Cannot tolerate high doses per patient- caused depressive thoughts    Current antibiotics: Antibiotics Given (last 72 hours)    Date/Time Action Medication Dose Rate   04/17/18 1810 New Bag/Given   meropenem (MERREM) 500 mg in sodium chloride 0.9 % 100 mL IVPB 500 mg 200 mL/hr   04/18/18 0548 New Bag/Given   meropenem (MERREM)  500 mg in sodium chloride 0.9 % 100 mL IVPB 500 mg 200 mL/hr   04/18/18 1805 New Bag/Given   meropenem (MERREM) 500 mg in sodium chloride 0.9 % 100 mL IVPB 500 mg 200 mL/hr   04/19/18 0616 New Bag/Given   meropenem (MERREM) 500 mg in sodium chloride 0.9 % 100 mL IVPB 500 mg 200 mL/hr   04/19/18 2158 New Bag/Given  [needs new IV access, waiting for IV team]   ceFAZolin (ANCEF) IVPB 1 g/50 mL premix 1 g 100 mL/hr   04/20/18 1212 New Bag/Given   ceFAZolin (ANCEF) IVPB 1 g/50 mL premix 1 g 100 mL/hr      MEDICATIONS: . aspirin  81 mg Oral Daily  . atorvastatin  40 mg Oral q1800  . buPROPion  150 mg Oral Daily  . carisoprodol  350 mg Oral QHS  . cholecalciferol  1,000 Units Oral Daily  . citalopram  20 mg Oral Daily  . fluticasone  2 spray Each Nare Daily  . fluticasone furoate-vilanterol  1 puff Inhalation Daily  . heparin injection (subcutaneous)  5,000 Units Subcutaneous Q8H   . levothyroxine  112 mcg Oral QAC breakfast  . multivitamin with minerals  1 tablet Oral Daily  . pantoprazole  40 mg Oral Daily  . sodium bicarbonate  650 mg Oral TID  . vitamin B-12  100 mcg Oral Daily    Review of Systems - 11 systems reviewed and negative per HPI   OBJECTIVE: Temp:  [97.5 F (36.4 C)-98.6 F (37 C)] 98.2 F (36.8 C) (05/17 0815) Pulse Rate:  [85-102] 85 (05/17 0815) Resp:  [18-20] 18 (05/17 0815) BP: (129-158)/(70-85) 129/70 (05/17 0815) SpO2:  [95 %-98 %] 97 % (05/17 0815) Weight:  [78.4 kg (172 lb 13.5 oz)] 78.4 kg (172 lb 13.5 oz) (05/17 0500) Physical Exam  Constitutional:  oriented to person, place, and time. Obese  HENT: [place]/AT, PERRLA, no scleral icterus Mouth/Throat: Oropharynx is clear and moist. No oropharyngeal exudate.  Cardiovascular: Normal rate, regular rhythm and normal heart sounds. Exam reveals no gallop and no friction rub.  No murmur heard.  Pulmonary/Chest: Effort normal and breath sounds normal. No respiratory distress.  has no wheezes.  Neck = supple, no nuchal rigidity Abdominal: Soft. Bowel sounds are normal.  exhibits no distension. There is no tenderness.  Lymphadenopathy: no cervical adenopathy. No axillary adenopathy Neurological: alert and oriented to person, place, and time.  Skin: Skin is warm and dry. No rash noted. No erythema.  Psychiatric: a normal mood and affect.  behavior is normal.    LABS: Results for orders placed or performed during the hospital encounter of 04/16/18 (from the past 48 hour(s))  Sodium     Status: Abnormal   Collection Time: 04/18/18  6:19 PM  Result Value Ref Range   Sodium 122 (L) 135 - 145 mmol/L    Comment: Performed at [facility], [address] [place], [place] [address]  Sodium     Status: Abnormal   Collection Time: 04/19/18 12:03 AM  Result Value Ref Range   Sodium 123 (L) 135 - 145 mmol/L    Comment: Performed at [facility], [address],  [place], [place] [address]  Basic metabolic panel     Status: Abnormal   Collection Time: 04/19/18  6:40 AM  Result Value Ref Range   Sodium 125 (L) 135 - 145 mmol/L   Potassium 4.0 3.5 - 5.1 mmol/L   Chloride 98 (L) 101 - 111 mmol/L  CO2 15 (L) 22 - 32 mmol/L   Glucose, Bld 145 (H) 65 - 99 mg/dL   BUN 62 (H) 6 - 20 mg/dL   Creatinine, Ser 4.42 (H) 0.44 - 1.00 mg/dL   Calcium 8.1 (L) 8.9 - 10.3 mg/dL   GFR calc non Af Amer 9 (L) >60 mL/min   GFR calc Af Amer 11 (L) >60 mL/min    Comment: (NOTE) The eGFR has been calculated using the CKD EPI equation. This calculation has not been validated in all clinical situations. eGFR's persistently <60 mL/min signify possible Chronic Kidney Disease.    Anion gap 12 5 - 15    Comment: Performed at [facility], [address] [place], [place] [address]  CBC     Status: Abnormal   Collection Time: 04/19/18  6:40 AM  Result Value Ref Range   WBC 20.1 (H) 3.6 - 11.0 K/uL   RBC 3.76 (L) 3.80 - 5.20 MIL/uL   Hemoglobin 11.5 (L) 12.0 - 16.0 g/dL   HCT 32.5 (L) 35.0 - 47.0 %   MCV 86.4 80.0 - 100.0 fL   MCH 30.6 26.0 - 34.0 pg   MCHC 35.4 32.0 - 36.0 g/dL   RDW 15.0 (H) 11.5 - 14.5 %   Platelets 172 150 - 440 K/uL    Comment: Performed at [facility], [address] [place], [place] [address]  Sodium     Status: Abnormal   Collection Time: 04/19/18 12:43 PM  Result Value Ref Range   Sodium 124 (L) 135 - 145 mmol/L    Comment: Performed at [facility], [address] [place], [place] [address]  Sodium     Status: Abnormal   Collection Time: 04/19/18  6:04 PM  Result Value Ref Range   Sodium 126 (L) 135 - 145 mmol/L    Comment: Performed at [facility], [address] [place], [place] [address]  Basic metabolic panel     Status: Abnormal   Collection Time: 04/20/18 12:07 AM  Result Value Ref Range   Sodium 127 (L) 135 - 145 mmol/L   Potassium 3.6 3.5 - 5.1 mmol/L   Chloride 101 101 - 111 mmol/L    CO2 15 (L) 22 - 32 mmol/L   Glucose, Bld 126 (H) 65 - 99 mg/dL   BUN 64 (H) 6 - 20 mg/dL   Creatinine, Ser 4.52 (H) 0.44 - 1.00 mg/dL   Calcium 8.1 (L) 8.9 - 10.3 mg/dL   GFR calc non Af Amer 9 (L) >60 mL/min   GFR calc Af Amer 10 (L) >60 mL/min    Comment: (NOTE) The eGFR has been calculated using the CKD EPI equation. This calculation has not been validated in all clinical situations. eGFR's persistently <60 mL/min signify possible Chronic Kidney Disease.    Anion gap 11 5 - 15    Comment: Performed at [facility], [address] [place], [place] [address]  CBC     Status: Abnormal   Collection Time: 04/20/18 12:07 AM  Result Value Ref Range   WBC 21.1 (H) 3.6 - 11.0 K/uL   RBC 3.59 (L) 3.80 - 5.20 MIL/uL   Hemoglobin 10.6 (L) 12.0 - 16.0 g/dL   HCT 31.1 (L) 35.0 - 47.0 %   MCV 86.9 80.0 - 100.0 fL   MCH 29.6 26.0 - 34.0 pg   MCHC 34.1 32.0 - 36.0 g/dL   RDW 15.0 (H) 11.5 - 14.5 %   Platelets 207 150 -  440 K/uL    Comment: Performed at [facility], [address] [place], [place] [address]  Sodium     Status: Abnormal   Collection Time: 04/20/18  4:42 AM  Result Value Ref Range   Sodium 127 (L) 135 - 145 mmol/L    Comment: Performed at [facility], [place], [address] [place], [place] [address]  Sodium     Status: Abnormal   Collection Time: 04/20/18 12:22 PM  Result Value Ref Range   Sodium 127 (L) 135 - 145 mmol/L    Comment: Performed at [facility], [address] [place], [place] [address]   No components found for: ESR, C REACTIVE PROTEIN MICRO: Recent Results (from the past 720 hour(s))  Blood culture (routine x 2)     Status: Abnormal   Collection Time: 04/16/18 12:42 PM  Result Value Ref Range Status   Specimen Description   Final    BLOOD RIGHT ANTECUBITAL Performed at [facility], [place] [address], [place], [place] [address]    Special Requests   Final    BOTTLES DRAWN AEROBIC AND ANAEROBIC Blood Culture  adequate volume Performed at [place]., [place], [place] [address]    Culture  Setup Time   Final    GRAM NEGATIVE RODS AEROBIC BOTTLE ONLY CRITICAL RESULT CALLED TO, READ BACK BY AND VERIFIED WITH: MATT MCBANE ON 04/17/18 AT 0042 JAG Performed at [facility], [place] [address] [place], [place] [address]    Culture ESCHERICHIA COLI (A)  Final   Report Status 04/19/2018 FINAL  Final   Organism ID, Bacteria ESCHERICHIA COLI  Final      Susceptibility   Escherichia coli - MIC*    AMPICILLIN 8 SENSITIVE Sensitive     CEFAZOLIN <=4 SENSITIVE Sensitive     CEFEPIME <=1 SENSITIVE Sensitive     CEFTAZIDIME <=1 SENSITIVE Sensitive     CEFTRIAXONE <=1 SENSITIVE Sensitive     CIPROFLOXACIN <=0.25 SENSITIVE Sensitive     GENTAMICIN <=1 SENSITIVE Sensitive     IMIPENEM <=0.25 SENSITIVE Sensitive     TRIMETH/SULFA <=20 SENSITIVE Sensitive     AMPICILLIN/SULBACTAM 4 SENSITIVE Sensitive     PIP/TAZO <=4 SENSITIVE Sensitive     Extended ESBL NEGATIVE Sensitive     * ESCHERICHIA COLI  Blood culture (routine x 2)     Status: Abnormal (Preliminary result)   Collection Time: 04/16/18 12:42 PM  Result Value Ref Range Status   Specimen Description   Final    BLOOD RIGHT HAND Performed at [place] [address], [place], [place] [address]    Special Requests   Final    BOTTLES DRAWN AEROBIC AND ANAEROBIC Blood Culture results may not be optimal due to an inadequate volume of blood received in culture bottles Performed at [facility], [address] [place], [place] [address]    Culture  Setup Time   Final    GRAM NEGATIVE RODS IN BOTH AEROBIC AND ANAEROBIC BOTTLES CRITICAL VALUE NOTED.  VALUE IS CONSISTENT WITH PREVIOUSLY REPORTED AND CALLED VALUE. JAG Performed at [facility], [address] [place],  [address]    Culture (A)  Final    ESCHERICHIA COLI SUSCEPTIBILITIES PERFORMED ON PREVIOUS CULTURE WITHIN THE LAST 5  DAYS. Performed at [facility], [place] [address] [place],  [address]    Report Status PENDING  Incomplete  Blood Culture ID Panel (Reflexed)  Status: Abnormal   Collection Time: 04/16/18 12:42 PM  Result Value Ref Range Status   Enterococcus species NOT DETECTED NOT DETECTED Final   Listeria monocytogenes NOT DETECTED NOT DETECTED Final   Staphylococcus species NOT DETECTED NOT DETECTED Final   Staphylococcus aureus NOT DETECTED NOT DETECTED Final   Streptococcus species NOT DETECTED NOT DETECTED Final   Streptococcus agalactiae NOT DETECTED NOT DETECTED Final   Streptococcus pneumoniae NOT DETECTED NOT DETECTED Final   Streptococcus pyogenes NOT DETECTED NOT DETECTED Final   Acinetobacter baumannii NOT DETECTED NOT DETECTED Final   Enterobacteriaceae species DETECTED (A) NOT DETECTED Final    Comment: Enterobacteriaceae represent a large family of gram-negative bacteria, not a single organism. CRITICAL RESULT CALLED TO, READ BACK BY AND VERIFIED WITH: MATT MCBANE ON 04/17/18 AT 0042 JAG    Enterobacter cloacae complex NOT DETECTED NOT DETECTED Final   Escherichia coli DETECTED (A) NOT DETECTED Final    Comment: CRITICAL RESULT CALLED TO, READ BACK BY AND VERIFIED WITH: MATT MCBANE ON 04/17/18 AT 0042 JAG    Klebsiella oxytoca NOT DETECTED NOT DETECTED Final   Klebsiella pneumoniae NOT DETECTED NOT DETECTED Final   Proteus species NOT DETECTED NOT DETECTED Final   Serratia marcescens NOT DETECTED NOT DETECTED Final   Carbapenem resistance NOT DETECTED NOT DETECTED Final   Haemophilus influenzae NOT DETECTED NOT DETECTED Final   Neisseria meningitidis NOT DETECTED NOT DETECTED Final   Pseudomonas aeruginosa NOT DETECTED NOT DETECTED Final   Candida albicans NOT DETECTED NOT DETECTED Final   Candida glabrata NOT DETECTED NOT DETECTED Final   Candida krusei NOT DETECTED NOT DETECTED Final   Candida parapsilosis NOT DETECTED NOT DETECTED Final   Candida tropicalis  NOT DETECTED NOT DETECTED Final    Comment: Performed at [facility], [address] [place], [place] [address]  CULTURE, BLOOD (ROUTINE X 2) w Reflex to ID Panel     Status: None (Preliminary result)   Collection Time: 04/16/18  6:23 PM  Result Value Ref Range Status   Specimen Description BLOOD  R FOREARM   Final   Special Requests   Final    BOTTLES DRAWN AEROBIC AND ANAEROBIC Blood Culture results may not be optimal due to an excessive volume of blood received in culture bottles   Culture   Final    NO GROWTH 4 DAYS Performed at [facility], [address] [place], [place] [address]    Report Status PENDING  Incomplete  CULTURE, BLOOD (ROUTINE X 2) w Reflex to ID Panel     Status: None (Preliminary result)   Collection Time: 04/16/18  6:33 PM  Result Value Ref Range Status   Specimen Description BLOOD  L WRIST  Final   Special Requests   Final    BOTTLES DRAWN AEROBIC AND ANAEROBIC Blood Culture results may not be optimal due to an excessive volume of blood received in culture bottles   Culture   Final    NO GROWTH 4 DAYS Performed at [facility], [address] [place], [place] [address]    Report Status PENDING  Incomplete  MRSA PCR Screening     Status: None   Collection Time: 04/17/18  8:50 AM  Result Value Ref Range Status   MRSA by PCR NEGATIVE NEGATIVE Final    Comment:        The GeneXpert MRSA Assay (FDA approved for NASAL specimens only), is one component of a comprehensive MRSA colonization surveillance program. It is not intended  to diagnose MRSA infection nor to guide or monitor treatment for MRSA infections. Performed at [facility], [address] [place], [place] [address]   Urine Culture     Status: Abnormal   Collection Time: 04/17/18  1:24 PM  Result Value Ref Range Status   Specimen Description   Final    URINE, RANDOM Performed at [facility], [address] [place], [place] [address]     Special Requests   Final    NONE Performed at [facility], [place], [place] [address]    Culture 10,000 COLONIES/mL ESCHERICHIA COLI (A)  Final   Report Status 04/19/2018 FINAL  Final   Organism ID, Bacteria ESCHERICHIA COLI (A)  Final      Susceptibility   Escherichia coli - MIC*    AMPICILLIN 8 SENSITIVE Sensitive     CEFAZOLIN <=4 SENSITIVE Sensitive     CEFTRIAXONE <=1 SENSITIVE Sensitive     CIPROFLOXACIN <=0.25 SENSITIVE Sensitive     GENTAMICIN <=1 SENSITIVE Sensitive     IMIPENEM <=0.25 SENSITIVE Sensitive     NITROFURANTOIN <=16 SENSITIVE Sensitive     TRIMETH/SULFA <=20 SENSITIVE Sensitive     AMPICILLIN/SULBACTAM 4 SENSITIVE Sensitive     PIP/TAZO <=4 SENSITIVE Sensitive     Extended ESBL NEGATIVE Sensitive     * 10,000 COLONIES/mL ESCHERICHIA COLI    IMAGING: Dg Chest 2 View  Result Date: 04/16/2018 CLINICAL DATA:  Several recent falls.  Weakness and nausea EXAM: CHEST - 2 VIEW COMPARISON:  July 21, 2016 FINDINGS: There is no edema or consolidation. The heart size and pulmonary vascularity are normal. No adenopathy. There is aortic atherosclerosis. No pneumothorax. No evident bone lesions. IMPRESSION: Aortic atherosclerosis.  No edema or consolidation. Aortic Atherosclerosis (ICD10-I70.0). Electronically Signed   By: Lowella Grip III M.D.   On: 04/16/2018 12:01   US Renal  Result Date: 04/17/2018 CLINICAL DATA:  Acute renal failure EXAM: RENAL / URINARY TRACT ULTRASOUND COMPLETE COMPARISON:  Lumbar MRI Apr 21, 2017 FINDINGS: Right Kidney: Length: 10.4 cm. The right kidney is inferiorly positioned in the right lower quadrant region. Echogenicity and renal cortical thickness are within normal limits. No mass, perinephric fluid, or hydronephrosis visualized. No sonographically demonstrable calculus or ureterectasis. Left Kidney: Length: 13.4 cm. Echogenicity and renal cortical thickness are within normal limits. No perinephric fluid or  hydronephrosis visualized. There is a left upper pole parapelvic cyst measuring 2.2 x 1.0 x 2.4 cm. No sonographically demonstrable calculus or ureterectasis. Bladder: Appears normal for degree of bladder distention. There is a cyst arising from the right ovary measuring 3.8 x 2.5 x 2.9 cm. IMPRESSION: 1. 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 2.  Upper pole parapelvic cyst left kidney, also seen on prior MR. 3. No obstructing focus in either kidney. No renal cortical thinning or increased echogenicity in either kidney. 4. Question size discrepancy between kidneys. Given the rotation of the right kidney, this finding may be spurious. The possibility of renal artery stenosis on the right must be question given apparent size discrepancy between kidneys. In this regard, question whether patient is hypertensive. 5. Incidental note is made of right ovarian cyst measuring 3.8 x 2.5 x 2.9 cm. Electronically Signed   By: Lowella Grip III M.D.   On: 04/17/2018 10:05    Assessment:   Jodi Beasley is a 72 y.o. female with acute illness with E coli bacteremia, + UCX, ARF and profound  hyponatremia. Likely had urinary source but no ua was done it seems. Also has not had stool studies.  Renal USS neg. CXR neg.  Her wbc is decreasing but still rather elevated.  Cr remains quite elevated.   Recommendations Check stool pcr looking for Shiga toxin E coli. Check C diff Cont ancef IV but when stable for DC can send home on keflex for total 14 day course  Thank you very much for allowing me to participate in the care of this patient. Please call with questions.   Cheral Marker. Ola Spurr, MD

## 2018-04-20 NOTE — Progress Notes (Signed)
[place] [facility] [place] NAME: Jodi Beasley    MR#:  [id number]  DATE OF BIRTH:  July 10, 1946  SUBJECTIVE:  CHIEF COMPLAINT:   Chief Complaint  Patient presents with  . Nausea  . Weakness    recently received Doxycycline, and had diarrhea- came with generalized weakness- found to have ac renal failure. Also noted to have bacteremia. Continue to have generalized weakness, renal function slightly worse.  REVIEW OF SYSTEMS:  CONSTITUTIONAL: No fever,have fatigue or weakness.  EYES: No blurred or double vision.  EARS, NOSE, AND THROAT: No tinnitus or ear pain.  RESPIRATORY: No cough, shortness of breath, wheezing or hemoptysis.  CARDIOVASCULAR: No chest pain, orthopnea, edema.  GASTROINTESTINAL: No nausea, vomiting,have diarrhea ,no abdominal pain.  GENITOURINARY: No dysuria, hematuria.  ENDOCRINE: No polyuria, nocturia,  HEMATOLOGY: No anemia, easy bruising or bleeding SKIN: No rash or lesion. MUSCULOSKELETAL: No joint pain or arthritis.   NEUROLOGIC: No tingling, numbness, weakness.  PSYCHIATRY: No anxiety or depression.   ROS  DRUG ALLERGIES:   Allergies  Allergen Reactions  . Benzocaine     Tongue swelling  . Darvon [Propoxyphene Hcl]     HA  . Monosodium Glutamate Diarrhea  . Neosporin [Neomycin-Bacitracin Zn-Polymyx]     blisters  . Nicoderm [Nicotine]     arrythmia  . Prednisone     Cannot tolerate high doses per patient- caused depressive thoughts    VITALS:  Blood pressure (!) 152/88, pulse 83, temperature 97.6 F (36.4 C), temperature source Oral, resp. rate 18, height 5\' 6"  (1.676 m), weight 78.4 kg (172 lb 13.5 oz), SpO2 98 %.  PHYSICAL EXAMINATION:  GENERAL:  72 y.o.-year-old patient lying in the bed with no acute distress.  EYES: Pupils equal, round, reactive to light and accommodation. No scleral icterus. Extraocular muscles intact.  HEENT: Head atraumatic, normocephalic. Oropharynx and nasopharynx clear.   NECK:  Supple, no jugular venous distention. No thyroid enlargement, no tenderness.  LUNGS: Normal breath sounds bilaterally, no wheezing, rales,rhonchi or crepitation. No use of accessory muscles of respiration.  CARDIOVASCULAR: S1, S2 normal. No murmurs, rubs, or gallops.  ABDOMEN: Soft, nontender, nondistended. Bowel sounds present. No organomegaly or mass.  EXTREMITIES: No pedal edema, cyanosis, or clubbing.  NEUROLOGIC: Cranial nerves II through XII are intact. Muscle strength 4/5 in all extremities. Sensation intact. Gait not checked.  PSYCHIATRIC: The patient is alert and oriented x 3.  SKIN: No obvious rash, lesion, or ulcer.   Physical Exam LABORATORY PANEL:   CBC Recent Labs  Lab 04/20/18 0007  WBC 21.1*  HGB 10.6*  HCT 31.1*  PLT 207   ------------------------------------------------------------------------------------------------------------------  Chemistries  Recent Labs  Lab 04/16/18 1055  04/20/18 0007  04/20/18 1222  NA 115*   < > 127*   < > 127*  K 3.9   < > 3.6  --   --   CL 83*   < > 101  --   --   CO2 17*   < > 15*  --   --   GLUCOSE 148*   < > 126*  --   --   BUN 47*   < > 64*  --   --   CREATININE 3.23*   < > 4.52*  --   --   CALCIUM 8.4*   < > 8.1*  --   --   AST 44*  --   --   --   --   ALT 28  --   --   --   --  ALKPHOS 95  --   --   --   --   BILITOT 1.1  --   --   --   --    < > = values in this interval not displayed.   ------------------------------------------------------------------------------------------------------------------  Cardiac Enzymes No results for input(s): TROPONINI in the last 168 hours. ------------------------------------------------------------------------------------------------------------------  RADIOLOGY:  No results found.  ASSESSMENT AND PLAN:   Active Problems:   Hyponatremia  *  Severe hyponatremia in the context of diarrhea   Hypovolemic hyponatremia On IV fluids NS- Sodium level every 6 hours-  not improved much. Normal TSH, urine sodium <10 Nephrology consultation appreciated,  started on Hypertonic saline and on fluid restriction as she was drinking a lot of liquids. Slow and steady improvement in her sodium level.  *  Acute kidney injury in the setting of dehydration with diarrhea Continue IV fluids and repeat BMP in a.m. Hold nephrotoxic medications Nephrology consultation appreciated.   Get Renal US- no blockages.  having urine output, worsening renal func. Nephro has added sodium bicarbonate.  * bacteremia   On IV meropenem.   Follow bl cx report. Change to keflex. Appreciated held by ID, suggested 14 days therapy.  *  Community-acquired pneumonia with leukocytosis:   Given Rocephin and azithromycin  Follow-up on blood cultures  Have bacteremia- changed Abx.  *  CAD: Continue aspirin, statin, metoprolol  *  Diarrhea: Check C. Difficile  now don't have diarrhea , so don't need to check. Appreciated ID consult, suggested to check C. difficile and she got toxin again.  *  Hypothyroidism: Continue Synthroid.  * COPD without signs of exacerbation.  *  Depression: Continue Celexa.  * Tobacco dependence: Patient is encouraged to quit smoking. Counseling was provided for 4 minutes.  All the records are reviewed and case discussed with Care Management/Social Workerr. Management plans discussed with the patient, family and they are in agreement.  CODE STATUS: full.  TOTAL TIME TAKING CARE OF THIS PATIENT: 35 minutes.   POSSIBLE D/C IN 2-3 DAYS, DEPENDING ON CLINICAL CONDITION.   Vaughan Basta M.D on 04/20/2018   Between 7am to 6pm - Pager - [phone]  After 6pm go to [email or web address] - password [email or web address] [facility]  Office  [phone]  CC: Primary care physician; Leone Haven, MD  Note: This dictation was prepared with Dragon dictation along with smaller phrase technology. Any transcriptional errors that result  from this process are unintentional.

## 2018-04-21 LAB — C DIFFICILE QUICK SCREEN W PCR REFLEX
C DIFFICILE (CDIFF) INTERP: NOT DETECTED
C DIFFICILE (CDIFF) TOXIN: NEGATIVE
C Diff antigen: NEGATIVE

## 2018-04-21 LAB — CBC
HEMATOCRIT: 30.5 % — AB (ref 35.0–47.0)
HEMOGLOBIN: 10.5 g/dL — AB (ref 12.0–16.0)
MCH: 29.6 pg (ref 26.0–34.0)
MCHC: 34.2 g/dL (ref 32.0–36.0)
MCV: 86.5 fL (ref 80.0–100.0)
Platelets: 258 10*3/uL (ref 150–440)
RBC: 3.53 MIL/uL — AB (ref 3.80–5.20)
RDW: 14.7 % — ABNORMAL HIGH (ref 11.5–14.5)
WBC: 20.9 10*3/uL — AB (ref 3.6–11.0)

## 2018-04-21 LAB — SODIUM
Sodium: 126 mmol/L — ABNORMAL LOW (ref 135–145)
Sodium: 129 mmol/L — ABNORMAL LOW (ref 135–145)

## 2018-04-21 LAB — BASIC METABOLIC PANEL
ANION GAP: 9 (ref 5–15)
BUN: 60 mg/dL — ABNORMAL HIGH (ref 6–20)
CO2: 17 mmol/L — AB (ref 22–32)
Calcium: 8.1 mg/dL — ABNORMAL LOW (ref 8.9–10.3)
Chloride: 101 mmol/L (ref 101–111)
Creatinine, Ser: 3.98 mg/dL — ABNORMAL HIGH (ref 0.44–1.00)
GFR, EST AFRICAN AMERICAN: 12 mL/min — AB (ref >60)
GFR, EST NON AFRICAN AMERICAN: 10 mL/min — AB (ref >60)
Glucose, Bld: 107 mg/dL — ABNORMAL HIGH (ref 65–99)
Potassium: 3.8 mmol/L (ref 3.5–5.1)
Sodium: 127 mmol/L — ABNORMAL LOW (ref 135–145)

## 2018-04-21 LAB — CULTURE, BLOOD (ROUTINE X 2)
CULTURE: NO GROWTH
Culture: NO GROWTH

## 2018-04-21 MED ORDER — METOPROLOL TARTRATE 25 MG PO TABS: 25.0000 mg | ORAL_TABLET | Freq: Two times a day (BID) | ORAL | 0 refills | Status: DC

## 2018-04-21 MED ORDER — SODIUM BICARBONATE 650 MG PO TABS: 650.0000 mg | ORAL_TABLET | Freq: Two times a day (BID) | ORAL | 0 refills | Status: AC

## 2018-04-21 MED ORDER — CEPHALEXIN 500 MG PO CAPS: 500.0000 mg | ORAL_CAPSULE | Freq: Three times a day (TID) | ORAL | 0 refills | Status: AC

## 2018-04-21 NOTE — Care Management (Addendum)
RNCM has notified Jodi Beasley with [facility] of patient discharge. Message left for patient at [phone] regarding this and if any other needs I have provided this RNCM contact number. I ended up speaking with patient after all.  She agrees to home health services with [facility]. Per RN patient's walker has been delivered. No other RNCM needs.

## 2018-04-21 NOTE — Progress Notes (Signed)
[facility]  ROUNDING NOTE   Subjective:   Na 127  Creatinine 3.98 (4.52)  Objective:  Vital signs in last 24 hours:  Temp:  [97.6 F (36.4 C)-98.1 F (36.7 C)] 98.1 F (36.7 C) (05/18 0739) Pulse Rate:  [74-83] 75 (05/18 0739) Resp:  [18] 18 (05/18 0739) BP: (140-152)/(68-88) 145/79 (05/18 0739) SpO2:  [95 %-99 %] 99 % (05/18 0739) Weight:  [79.5 kg (175 lb 3.2 oz)] 79.5 kg (175 lb 3.2 oz) (05/18 0504)  Weight change: 1.07 kg (2 lb 5.8 oz) Filed Weights   04/19/18 0500 04/20/18 0500 04/21/18 0504  Weight: 77.6 kg (171 lb) 78.4 kg (172 lb 13.5 oz) 79.5 kg (175 lb 3.2 oz)    Intake/Output: I/O last 3 completed shifts: In: 2550 [P.O.:2400; IV Piggyback:150] Out: 2400 [Urine:2400]   Intake/Output this shift:  No intake/output data recorded.  Physical Exam: General: NAD, laying in bed  Head: Normocephalic, atraumatic. Moist oral mucosal membranes  Eyes: Anicteric, PERRL  Neck: Supple, trachea midline  Lungs:  Clear to auscultation  Heart: Regular rate and rhythm  Abdomen:  Soft, nontender,   Extremities: no peripheral edema.  Neurologic: Nonfocal, moving all four extremities  Skin: No lesions        Basic Metabolic Panel: Recent Labs  Lab 04/17/18 0547  04/18/18 0554  04/19/18 0640  04/20/18 0007 04/20/18 0442 04/20/18 1222 04/20/18 1754 04/21/18 0021 04/21/18 0626  NA 119*   < > 123*   < > 125*   < > 127* 127* 127* 126* 126* 127*  K 3.7  --  3.6  --  4.0  --  3.6  --   --   --   --  3.8  CL 89*  --  94*  --  98*  --  101  --   --   --   --  101  CO2 19*  --  17*  --  15*  --  15*  --   --   --   --  17*  GLUCOSE 93  --  93  --  145*  --  126*  --   --   --   --  107*  BUN 50*  --  56*  --  62*  --  64*  --   --   --   --  60*  CREATININE 3.83*  --  4.23*  --  4.42*  --  4.52*  --   --   --   --  3.98*  CALCIUM 7.6*  --  8.0*  --  8.1*  --  8.1*  --   --   --   --  8.1*   < > = values in this interval not displayed.    Liver Function  Tests: Recent Labs  Lab 04/16/18 1055  AST 44*  ALT 28  ALKPHOS 95  BILITOT 1.1  PROT 7.0  ALBUMIN 3.0*   Recent Labs  Lab 04/16/18 1055  LIPASE 21   No results for input(s): AMMONIA in the last 168 hours.  CBC: Recent Labs  Lab 04/16/18 1055 04/17/18 0547 04/18/18 0554 04/19/18 0640 04/20/18 0007 04/21/18 0626  WBC 30.2* 21.2* 17.1* 20.1* 21.1* 20.9*  NEUTROABS 27.6*  --   --   --   --   --   HGB 13.8 11.4* 11.7* 11.5* 10.6* 10.5*  HCT 39.7 33.7* 34.2* 32.5* 31.1* 30.5*  MCV 86.1 88.4 88.4 86.4 86.9 86.5  PLT 136* 120* 124*  172 207 258    Cardiac Enzymes: No results for input(s): CKTOTAL, CKMB, CKMBINDEX, TROPONINI in the last 168 hours.  BNP: Invalid input(s): POCBNP  CBG: Recent Labs  Lab 04/16/18 1410  GLUCAP 115*    Microbiology: Results for orders placed or performed during the hospital encounter of 04/16/18  Blood culture (routine x 2)     Status: Abnormal   Collection Time: 04/16/18 12:42 PM  Result Value Ref Range Status   Specimen Description   Final    BLOOD RIGHT ANTECUBITAL Performed at [facility], [place] [address], [place], [place] [address]    Special Requests   Final    BOTTLES DRAWN AEROBIC AND ANAEROBIC Blood Culture adequate volume Performed at [place] [place], [place] [address]    Culture  Setup Time   Final    GRAM NEGATIVE RODS AEROBIC BOTTLE ONLY CRITICAL RESULT CALLED TO, READ BACK BY AND VERIFIED WITH: MATT MCBANE ON 04/17/18 AT 0042 JAG Performed at [facility], [place] [address], [place], [place] [address]    Culture ESCHERICHIA COLI (A)  Final   Report Status 04/19/2018 FINAL  Final   Organism ID, Bacteria ESCHERICHIA COLI  Final      Susceptibility   Escherichia coli - MIC*    AMPICILLIN 8 SENSITIVE Sensitive     CEFAZOLIN <=4 SENSITIVE Sensitive     CEFEPIME <=1 SENSITIVE Sensitive     CEFTAZIDIME <=1 SENSITIVE Sensitive     CEFTRIAXONE <=1 SENSITIVE Sensitive      CIPROFLOXACIN <=0.25 SENSITIVE Sensitive     GENTAMICIN <=1 SENSITIVE Sensitive     IMIPENEM <=0.25 SENSITIVE Sensitive     TRIMETH/SULFA <=20 SENSITIVE Sensitive     AMPICILLIN/SULBACTAM 4 SENSITIVE Sensitive     PIP/TAZO <=4 SENSITIVE Sensitive     Extended ESBL NEGATIVE Sensitive     * ESCHERICHIA COLI  Blood culture (routine x 2)     Status: Abnormal   Collection Time: 04/16/18 12:42 PM  Result Value Ref Range Status   Specimen Description   Final    BLOOD RIGHT HAND Performed at [facility], [place] [address] [place], [place] [address]    Special Requests   Final    BOTTLES DRAWN AEROBIC AND ANAEROBIC Blood Culture results may not be optimal due to an inadequate volume of blood received in culture bottles Performed at [facility], [address] [place], [place] [address]    Culture  Setup Time   Final    GRAM NEGATIVE RODS IN BOTH AEROBIC AND ANAEROBIC BOTTLES CRITICAL VALUE NOTED.  VALUE IS CONSISTENT WITH PREVIOUSLY REPORTED AND CALLED VALUE. JAG Performed at [facility], [address] [place], [place] [address]    Culture (A)  Final    ESCHERICHIA COLI SUSCEPTIBILITIES PERFORMED ON PREVIOUS CULTURE WITHIN THE LAST 5 DAYS. Performed at [facility], [place] [address], [place], [place] [address]    Report Status 04/21/2018 FINAL  Final  Blood Culture ID Panel (Reflexed)     Status: Abnormal   Collection Time: 04/16/18 12:42 PM  Result Value Ref Range Status   Enterococcus species NOT DETECTED NOT DETECTED Final   Listeria monocytogenes NOT DETECTED NOT DETECTED Final   Staphylococcus species NOT DETECTED NOT DETECTED Final   Staphylococcus aureus NOT DETECTED NOT DETECTED Final   Streptococcus species NOT DETECTED NOT DETECTED Final   Streptococcus agalactiae NOT DETECTED NOT DETECTED Final   Streptococcus pneumoniae NOT DETECTED  NOT DETECTED Final   Streptococcus pyogenes NOT DETECTED NOT DETECTED Final   Acinetobacter baumannii NOT  DETECTED NOT DETECTED Final   Enterobacteriaceae species DETECTED (A) NOT DETECTED Final    Comment: Enterobacteriaceae represent a large family of gram-negative bacteria, not a single organism. CRITICAL RESULT CALLED TO, READ BACK BY AND VERIFIED WITH: MATT MCBANE ON 04/17/18 AT 0042 JAG    Enterobacter cloacae complex NOT DETECTED NOT DETECTED Final   Escherichia coli DETECTED (A) NOT DETECTED Final    Comment: CRITICAL RESULT CALLED TO, READ BACK BY AND VERIFIED WITH: MATT MCBANE ON 04/17/18 AT 0042 JAG    Klebsiella oxytoca NOT DETECTED NOT DETECTED Final   Klebsiella pneumoniae NOT DETECTED NOT DETECTED Final   Proteus species NOT DETECTED NOT DETECTED Final   Serratia marcescens NOT DETECTED NOT DETECTED Final   Carbapenem resistance NOT DETECTED NOT DETECTED Final   Haemophilus influenzae NOT DETECTED NOT DETECTED Final   Neisseria meningitidis NOT DETECTED NOT DETECTED Final   Pseudomonas aeruginosa NOT DETECTED NOT DETECTED Final   Candida albicans NOT DETECTED NOT DETECTED Final   Candida glabrata NOT DETECTED NOT DETECTED Final   Candida krusei NOT DETECTED NOT DETECTED Final   Candida parapsilosis NOT DETECTED NOT DETECTED Final   Candida tropicalis NOT DETECTED NOT DETECTED Final    Comment: Performed at [facility], [address] [place], [place] [address]  CULTURE, BLOOD (ROUTINE X 2) w Reflex to ID Panel     Status: None   Collection Time: 04/16/18  6:23 PM  Result Value Ref Range Status   Specimen Description BLOOD  R FOREARM   Final   Special Requests   Final    BOTTLES DRAWN AEROBIC AND ANAEROBIC Blood Culture results may not be optimal due to an excessive volume of blood received in culture bottles   Culture   Final    NO GROWTH 5 DAYS Performed at [facility], [place] [place], [place] [address]    Report Status 04/21/2018 FINAL  Final  CULTURE, BLOOD (ROUTINE X 2) w Reflex to ID Panel     Status: None   Collection Time:  04/16/18  6:33 PM  Result Value Ref Range Status   Specimen Description BLOOD  L WRIST  Final   Special Requests   Final    BOTTLES DRAWN AEROBIC AND ANAEROBIC Blood Culture results may not be optimal due to an excessive volume of blood received in culture bottles   Culture   Final    NO GROWTH 5 DAYS Performed at [facility], [address] [place], [place] [address]    Report Status 04/21/2018 FINAL  Final  MRSA PCR Screening     Status: None   Collection Time: 04/17/18  8:50 AM  Result Value Ref Range Status   MRSA by PCR NEGATIVE NEGATIVE Final    Comment:        The GeneXpert MRSA Assay (FDA approved for NASAL specimens only), is one component of a comprehensive MRSA colonization surveillance program. It is not intended to diagnose MRSA infection nor to guide or monitor treatment for MRSA infections. Performed at [facility], [address], [place], [place] [address]   Urine Culture     Status: Abnormal   Collection Time: 04/17/18  1:24 PM  Result Value Ref Range Status   Specimen Description   Final    URINE, RANDOM Performed at [facility], [address] [place], [place] [address]  Special Requests   Final    NONE Performed at [facility], [place], [place] [address]    Culture 10,000 COLONIES/mL ESCHERICHIA COLI (A)  Final   Report Status 04/19/2018 FINAL  Final   Organism ID, Bacteria ESCHERICHIA COLI (A)  Final      Susceptibility   Escherichia coli - MIC*    AMPICILLIN 8 SENSITIVE Sensitive     CEFAZOLIN <=4 SENSITIVE Sensitive     CEFTRIAXONE <=1 SENSITIVE Sensitive     CIPROFLOXACIN <=0.25 SENSITIVE Sensitive     GENTAMICIN <=1 SENSITIVE Sensitive     IMIPENEM <=0.25 SENSITIVE Sensitive     NITROFURANTOIN <=16 SENSITIVE Sensitive     TRIMETH/SULFA <=20 SENSITIVE Sensitive     AMPICILLIN/SULBACTAM 4 SENSITIVE Sensitive     PIP/TAZO <=4 SENSITIVE Sensitive     Extended ESBL NEGATIVE  Sensitive     * 10,000 COLONIES/mL ESCHERICHIA COLI  C difficile quick scan w PCR reflex     Status: None   Collection Time: 04/21/18  7:32 AM  Result Value Ref Range Status   C Diff antigen NEGATIVE NEGATIVE Final   C Diff toxin NEGATIVE NEGATIVE Final   C Diff interpretation No C. difficile detected.  Final    Comment: Performed at [facility], [address] [place], [place] [address]    Coagulation Studies: No results for input(s): LABPROT, INR in the last 72 hours.  Urinalysis: No results for input(s): COLORURINE, LABSPEC, PHURINE, GLUCOSEU, HGBUR, BILIRUBINUR, KETONESUR, PROTEINUR, UROBILINOGEN, NITRITE, LEUKOCYTESUR in the last 72 hours.  Invalid input(s): APPERANCEUR    Imaging: No results found.   Medications:   .  ceFAZolin (ANCEF) IV 1 g (04/21/18 1148)   . aspirin  81 mg Oral Daily  . atorvastatin  40 mg Oral q1800  . buPROPion  150 mg Oral Daily  . carisoprodol  350 mg Oral QHS  . cholecalciferol  1,000 Units Oral Daily  . citalopram  20 mg Oral Daily  . fluticasone  2 spray Each Nare Daily  . fluticasone furoate-vilanterol  1 puff Inhalation Daily  . heparin injection (subcutaneous)  5,000 Units Subcutaneous Q8H  . levothyroxine  112 mcg Oral QAC breakfast  . metoprolol tartrate  25 mg Oral BID  . multivitamin with minerals  1 tablet Oral Daily  . pantoprazole  40 mg Oral Daily  . sodium bicarbonate  650 mg Oral TID  . vitamin B-12  100 mcg Oral Daily   acetaminophen **OR** acetaminophen, ipratropium-albuterol, ondansetron **OR** ondansetron (ZOFRAN) IV, polyethylene glycol  Assessment/ Plan:  [name] is a 72 y.o. white female [name] is a 72 y.o. white female with COPD, coronary artery disease, depression, GERD, hyperlipidemia, hypothyroidism, who was admitted to [facility] on 04/16/2018  1. Acute Renal Failure: ATN 2. Hyponatremia 3. Metabolic Acidosis 4. Hypertension 5. Urinary tract infection: E.  Coli  Plan Hypovolemic hyponatremia with prerenal azotemia.  - Completed 3 days of hypertonic saline.  - Fluid restriction - continue sodium bicarbonate   LOS: 5 Anntoinette Haefele 5/18/201912:08 PM

## 2018-04-21 NOTE — Progress Notes (Signed)
Patient discharging home. Instructions and prescriptions given to patient, verbalized understanding. Waiting on family to transport patient home.

## 2018-04-21 NOTE — Discharge Summary (Signed)
[facility] NAME: Jodi Beasley    MR#:  [id number]  DATE OF BIRTH:  1946-11-19  DATE OF ADMISSION:  04/16/2018 ADMITTING PHYSICIAN: Bettey Costa, MD  DATE OF DISCHARGE: 04/21/2018  1:56 PM  PRIMARY CARE PHYSICIAN: Leone Haven, MD   ADMISSION DIAGNOSIS:  Diarrhea of presumed infectious origin [R19.7] Hyponatremia [E87.1] AKI (acute kidney injury) ([place]) [N17.9] Sepsis, due to unspecified organism ([place]) [A41.9] Leukocytosis, unspecified type [D72.829] Pneumonia due to infectious organism, unspecified laterality, unspecified part of lung [J18.9]  DISCHARGE DIAGNOSIS:  Hyponatremia Sepsis E. coli bacteremia E. coli urinary tract infection Acute kidney injury Community-acquired pneumonia   SECONDARY DIAGNOSIS:   Past Medical History:  Diagnosis Date  . Arrhythmia   . Chicken pox   . COPD (chronic obstructive pulmonary disease) ([place])   . Coronary artery disease    NSTEMI in 09/2008. Cath : 80% RCA and 95% first diagonal. PCI and 2 DES placement  RCA and 1 DES to diagonal. Complicated by acute diagonal stent thrombosis . Treated by thrombectomy. Most recent nuclear stress test in 2010 showed no ischemia with normal EF.   Marland Kitchen Depression   . GERD (gastroesophageal reflux disease)   . Hay fever   . History of blood transfusion   . History of hypercholesterolemia   . History of syncope 2000   negative EP study  . Hyperlipidemia    intolerance to statins except Crestor.   . Hypertension   . Hypothyroid   . MI (myocardial infarction) ([place])   . Pneumonia, organism unspecified(486)      ADMITTING HISTORY Jodi Beasley  is a 72 y.o. female with a known history of COPD, CAD and depression who presents to the emergency room due to generalized weakness and diarrhea.  Patient was treated for sinus infection on doxycycline about a week ago.  Over the past 24 hours she has had diarrhea.  She denies fever or chills.  She denies  abdominal pain.  She denies chest pain or shortness of breath. He does report she has generalized weakness and has had some falls since yesterday.  She denies neurological deficits.  She denies any urinary symptoms.  Sodium level 115 at the time of admission patient  HOSPITAL COURSE:  Patient was admitted to the medical floor initially she was started on IV fluids for hyponatremia.  Nephrology consultation was done for low sodium level.  Patient was started on IV Rocephin and Zithromax antibiotics initially for pneumonia and a urine and blood cultures were done.  Patient had gram-negative bacteremia during the hospitalization.Patient was started on IV meropenem antibiotic. Later on switched to IV cefazolin antibiotic. Blood cultures grew E. coli and urine cultures also grew E. coli .  Sensitivities were checked and infectious disease consultation was done who recommended to switch to oral Keflex antibiotic for 14 days. patient was put on fluid restriction 1500 mL/day and also received hypertonic saline for 3 days.  Patient completed 3 days of hypertonic saline intravenously and was started on sodium bicarbonate tablets,  1 tablet 3 times daily by nephrology along with fluid restriction.  Her sodium level improved steadily and at the time of discharge sodium level was 127.  Patient was evaluated by physical therapy and was ambulating independently. Her wbc count also improved. Patient will be discharged to home with [facility] . CONSULTS OBTAINED:  Treatment Team:  Lavonia Dana, MD Leonel Ramsay, MD Saundra Shelling, MD  DRUG ALLERGIES:   Allergies  Allergen Reactions  . Benzocaine     Tongue swelling  . Darvon [Propoxyphene Hcl]     HA  . Monosodium Glutamate Diarrhea  . Neosporin [Neomycin-Bacitracin Zn-Polymyx]     blisters  . Nicoderm [Nicotine]     arrythmia  . Prednisone     Cannot tolerate high doses per patient- caused depressive  thoughts    DISCHARGE MEDICATIONS:   Allergies as of 04/21/2018      Reactions   Benzocaine    Tongue swelling   Darvon [propoxyphene Hcl]    HA   Monosodium Glutamate Diarrhea   Neosporin [neomycin-bacitracin Zn-polymyx]    blisters   Nicoderm [nicotine]    arrythmia   Prednisone    Cannot tolerate high doses per patient- caused depressive thoughts      Medication List    STOP taking these medications   doxycycline 100 MG tablet Commonly known as:  VIBRA-TABS     TAKE these medications   albuterol 108 (90 Base) MCG/ACT inhaler Commonly known as:  PROAIR HFA Inhale 1-2 puffs into the lungs every 6 (six) hours as needed for wheezing or shortness of breath.   aspirin 81 MG tablet Take 81 mg by mouth daily.   atorvastatin 80 MG tablet Commonly known as:  LIPITOR TAKE 1 TABLET(80 MG) BY MOUTH DAILY   BREO ELLIPTA 100-25 MCG/INH Aepb Generic drug:  fluticasone furoate-vilanterol INHALE 1 PUFF BY MOUTH EVERY DAY   buPROPion 150 MG 24 hr tablet Commonly known as:  WELLBUTRIN XL TAKE 1 TABLET(150 MG) BY MOUTH DAILY   carisoprodol 350 MG tablet Commonly known as:  SOMA TAKE 1 TABLET BY MOUTH TWICE DAILY AS NEEDED FOR MUSCLE SPASMS   cephALEXin 500 MG capsule Commonly known as:  KEFLEX Take 1 capsule (500 mg total) by mouth 3 (three) times daily for 14 days.   citalopram 40 MG tablet Commonly known as:  CELEXA TAKE 1 TABLET(40 MG) BY MOUTH DAILY   CoQ10 400 MG Caps Take 400 mg by mouth daily.   fluticasone 50 MCG/ACT nasal spray Commonly known as:  FLONASE Place 2 sprays into both nostrils daily.   levothyroxine 112 MCG tablet Commonly known as:  SYNTHROID, LEVOTHROID TAKE 1 TABLET BY MOUTH EVERY DAY BEFORE BREAKFAST   metoprolol tartrate 25 MG tablet Commonly known as:  LOPRESSOR Take 1 tablet (25 mg total) by mouth 2 (two) times daily. What changed:    medication strength  See the new instructions.   multivitamin capsule Take 1 capsule by mouth  daily.   nitroGLYCERIN 0.4 MG SL tablet Commonly known as:  NITROSTAT Place 1 tablet (0.4 mg total) under the tongue every 5 (five) minutes as needed.   omeprazole 20 MG capsule Commonly known as:  PRILOSEC TAKE 1 CAPSULE BY MOUTH EVERY DAY   sodium bicarbonate 650 MG tablet Take 1 tablet (650 mg total) by mouth 2 (two) times daily.   vitamin B-12 100 MCG tablet Commonly known as:  CYANOCOBALAMIN Take 100 mcg by mouth daily.   Vitamin D-3 1000 units Caps Take 1 capsule by mouth daily.            Durable Medical Equipment  (From admission, onward)        Start     Ordered   04/20/18 1333  For home use only DME Walker rolling  Once    Question:  Patient needs a walker to treat with the following condition  Answer:  Weakness  04/20/18 1333      Today  Patient seen and evaluated today No fever No shortness of breath No nausea and vomiting  VITAL SIGNS:  Blood pressure (!) 145/79, pulse 75, temperature 98.1 F (36.7 C), temperature source Oral, resp. rate 18, height 5\' 6"  (1.676 m), weight 79.5 kg (175 lb 3.2 oz), SpO2 99 %.  I/O:    Intake/Output Summary (Last 24 hours) at 04/21/2018 1441 Last data filed at 04/21/2018 0503 Gross per 24 hour  Intake 900 ml  Output 1100 ml  Net -200 ml    PHYSICAL EXAMINATION:  Physical Exam  GENERAL:  72 y.o.-year-old patient lying in the bed with no acute distress.  LUNGS: Normal breath sounds bilaterally, no wheezing, rales,rhonchi or crepitation. No use of accessory muscles of respiration.  CARDIOVASCULAR: S1, S2 normal. No murmurs, rubs, or gallops.  ABDOMEN: Soft, non-tender, non-distended. Bowel sounds present. No organomegaly or mass.  NEUROLOGIC: Moves all 4 extremities. PSYCHIATRIC: The patient is alert and oriented x 3.  SKIN: No obvious rash, lesion, or ulcer.   DATA REVIEW:   CBC Recent Labs  Lab 04/21/18 0626  WBC 20.9*  HGB 10.5*  HCT 30.5*  PLT 258    Chemistries  Recent Labs  Lab  04/16/18 1055  04/21/18 0626 04/21/18 1248  NA 115*   < > 127* 129*  K 3.9   < > 3.8  --   CL 83*   < > 101  --   CO2 17*   < > 17*  --   GLUCOSE 148*   < > 107*  --   BUN 47*   < > 60*  --   CREATININE 3.23*   < > 3.98*  --   CALCIUM 8.4*   < > 8.1*  --   AST 44*  --   --   --   ALT 28  --   --   --   ALKPHOS 95  --   --   --   BILITOT 1.1  --   --   --    < > = values in this interval not displayed.    Cardiac Enzymes No results for input(s): TROPONINI in the last 168 hours.  Microbiology Results  Results for orders placed or performed during the hospital encounter of 04/16/18  Blood culture (routine x 2)     Status: Abnormal   Collection Time: 04/16/18 12:42 PM  Result Value Ref Range Status   Specimen Description   Final    BLOOD RIGHT ANTECUBITAL Performed at [facility], [place] [address], [place], [place] [address]    Special Requests   Final    BOTTLES DRAWN AEROBIC AND ANAEROBIC Blood Culture adequate volume Performed at [address] [place], [place] [address]    Culture  Setup Time   Final    GRAM NEGATIVE RODS AEROBIC BOTTLE ONLY CRITICAL RESULT CALLED TO, READ BACK BY AND VERIFIED WITH: MATT MCBANE ON 04/17/18 AT 0042 JAG Performed at [facility], [place] [address] [place], [place] [address]    Culture ESCHERICHIA COLI (A)  Final   Report Status 04/19/2018 FINAL  Final   Organism ID, Bacteria ESCHERICHIA COLI  Final      Susceptibility   Escherichia coli - MIC*    AMPICILLIN 8 SENSITIVE Sensitive     CEFAZOLIN <=4 SENSITIVE Sensitive     CEFEPIME <=1 SENSITIVE Sensitive     CEFTAZIDIME <=1 SENSITIVE Sensitive  CEFTRIAXONE <=1 SENSITIVE Sensitive     CIPROFLOXACIN <=0.25 SENSITIVE Sensitive     GENTAMICIN <=1 SENSITIVE Sensitive     IMIPENEM <=0.25 SENSITIVE Sensitive     TRIMETH/SULFA <=20 SENSITIVE Sensitive     AMPICILLIN/SULBACTAM 4 SENSITIVE Sensitive     PIP/TAZO <=4 SENSITIVE Sensitive     Extended ESBL  NEGATIVE Sensitive     * ESCHERICHIA COLI  Blood culture (routine x 2)     Status: Abnormal   Collection Time: 04/16/18 12:42 PM  Result Value Ref Range Status   Specimen Description   Final    BLOOD RIGHT HAND Performed at [facility], [place] [address], [place], [place] [address]    Special Requests   Final    BOTTLES DRAWN AEROBIC AND ANAEROBIC Blood Culture results may not be optimal due to an inadequate volume of blood received in culture bottles Performed at [facility], [address] [place], [place] [address]    Culture  Setup Time   Final    GRAM NEGATIVE RODS IN BOTH AEROBIC AND ANAEROBIC BOTTLES CRITICAL VALUE NOTED.  VALUE IS CONSISTENT WITH PREVIOUSLY REPORTED AND CALLED VALUE. JAG Performed at [facility], [address] [place], [place] [address]    Culture (A)  Final    ESCHERICHIA COLI SUSCEPTIBILITIES PERFORMED ON PREVIOUS CULTURE WITHIN THE LAST 5 DAYS. Performed at [facility], [place] [address], [place], [place] [address]    Report Status 04/21/2018 FINAL  Final  Blood Culture ID Panel (Reflexed)     Status: Abnormal   Collection Time: 04/16/18 12:42 PM  Result Value Ref Range Status   Enterococcus species NOT DETECTED NOT DETECTED Final   Listeria monocytogenes NOT DETECTED NOT DETECTED Final   Staphylococcus species NOT DETECTED NOT DETECTED Final   Staphylococcus aureus NOT DETECTED NOT DETECTED Final   Streptococcus species NOT DETECTED NOT DETECTED Final   Streptococcus agalactiae NOT DETECTED NOT DETECTED Final   Streptococcus pneumoniae NOT DETECTED NOT DETECTED Final   Streptococcus pyogenes NOT DETECTED NOT DETECTED Final   Acinetobacter baumannii NOT DETECTED NOT DETECTED Final   Enterobacteriaceae species DETECTED (A) NOT DETECTED Final    Comment: Enterobacteriaceae represent a large family of gram-negative bacteria, not a single organism. CRITICAL RESULT CALLED TO, READ BACK BY AND VERIFIED WITH: MATT MCBANE ON  04/17/18 AT 0042 JAG    Enterobacter cloacae complex NOT DETECTED NOT DETECTED Final   Escherichia coli DETECTED (A) NOT DETECTED Final    Comment: CRITICAL RESULT CALLED TO, READ BACK BY AND VERIFIED WITH: MATT MCBANE ON 04/17/18 AT 0042 JAG    Klebsiella oxytoca NOT DETECTED NOT DETECTED Final   Klebsiella pneumoniae NOT DETECTED NOT DETECTED Final   Proteus species NOT DETECTED NOT DETECTED Final   Serratia marcescens NOT DETECTED NOT DETECTED Final   Carbapenem resistance NOT DETECTED NOT DETECTED Final   Haemophilus influenzae NOT DETECTED NOT DETECTED Final   Neisseria meningitidis NOT DETECTED NOT DETECTED Final   Pseudomonas aeruginosa NOT DETECTED NOT DETECTED Final   Candida albicans NOT DETECTED NOT DETECTED Final   Candida glabrata NOT DETECTED NOT DETECTED Final   Candida krusei NOT DETECTED NOT DETECTED Final   Candida parapsilosis NOT DETECTED NOT DETECTED Final   Candida tropicalis NOT DETECTED NOT DETECTED Final    Comment: Performed at [facility], [place], [place] [address]  CULTURE, BLOOD (ROUTINE X 2) w Reflex to ID Panel     Status: None  Collection Time: 04/16/18  6:23 PM  Result Value Ref Range Status   Specimen Description BLOOD  R FOREARM   Final   Special Requests   Final    BOTTLES DRAWN AEROBIC AND ANAEROBIC Blood Culture results may not be optimal due to an excessive volume of blood received in culture bottles   Culture   Final    NO GROWTH 5 DAYS Performed at [facility], [address] [place], [place] [address]    Report Status 04/21/2018 FINAL  Final  CULTURE, BLOOD (ROUTINE X 2) w Reflex to ID Panel     Status: None   Collection Time: 04/16/18  6:33 PM  Result Value Ref Range Status   Specimen Description BLOOD  L WRIST  Final   Special Requests   Final    BOTTLES DRAWN AEROBIC AND ANAEROBIC Blood Culture results may not be optimal due to an excessive volume of blood received in culture bottles   Culture    Final    NO GROWTH 5 DAYS Performed at [facility], [address] [place], [place] [address]    Report Status 04/21/2018 FINAL  Final  MRSA PCR Screening     Status: None   Collection Time: 04/17/18  8:50 AM  Result Value Ref Range Status   MRSA by PCR NEGATIVE NEGATIVE Final    Comment:        The GeneXpert MRSA Assay (FDA approved for NASAL specimens only), is one component of a comprehensive MRSA colonization surveillance program. It is not intended to diagnose MRSA infection nor to guide or monitor treatment for MRSA infections. Performed at [facility], [address], [place], [place] [address]   Urine Culture     Status: Abnormal   Collection Time: 04/17/18  1:24 PM  Result Value Ref Range Status   Specimen Description   Final    URINE, RANDOM Performed at [facility], [address] [place], [place] [address]    Special Requests   Final    NONE Performed at [facility], [place], [place] [address]    Culture 10,000 COLONIES/mL ESCHERICHIA COLI (A)  Final   Report Status 04/19/2018 FINAL  Final   Organism ID, Bacteria ESCHERICHIA COLI (A)  Final      Susceptibility   Escherichia coli - MIC*    AMPICILLIN 8 SENSITIVE Sensitive     CEFAZOLIN <=4 SENSITIVE Sensitive     CEFTRIAXONE <=1 SENSITIVE Sensitive     CIPROFLOXACIN <=0.25 SENSITIVE Sensitive     GENTAMICIN <=1 SENSITIVE Sensitive     IMIPENEM <=0.25 SENSITIVE Sensitive     NITROFURANTOIN <=16 SENSITIVE Sensitive     TRIMETH/SULFA <=20 SENSITIVE Sensitive     AMPICILLIN/SULBACTAM 4 SENSITIVE Sensitive     PIP/TAZO <=4 SENSITIVE Sensitive     Extended ESBL NEGATIVE Sensitive     * 10,000 COLONIES/mL ESCHERICHIA COLI  C difficile quick scan w PCR reflex     Status: None   Collection Time: 04/21/18  7:32 AM  Result Value Ref Range Status   C Diff antigen NEGATIVE NEGATIVE Final   C Diff toxin NEGATIVE NEGATIVE Final   C Diff interpretation No  C. difficile detected.  Final    Comment: Performed at [facility], [address] [place], [place] [address]    RADIOLOGY:  No results found.  Follow up with PCP in 1 week.  Management plans discussed with the patient, family and they are in  agreement.  CODE STATUS: Full code    Code Status Orders  (From admission, onward)        Start     Ordered   04/16/18 1432  Full code  Continuous     04/16/18 1431    Code Status History    This patient has a current code status but no historical code status.    Advance Directive Documentation     Most Recent Value  Type of Advance Directive  Living will, Healthcare Power of Attorney  Pre-existing out of facility DNR order (yellow form or pink MOST form)  -  "MOST" Form in Place?  -      TOTAL TIME TAKING CARE OF THIS PATIENT ON DAY OF DISCHARGE: more than 35 minutes.   Saundra Shelling M.D on 04/21/2018 at 2:41 PM  Between 7am to 6pm - Pager - [phone]  After 6pm go to [email or web address] - password [email or web address] [facility]  Office  [phone]  CC: Primary care physician; Leone Haven, MD  Note: This dictation was prepared with Dragon dictation along with smaller phrase technology. Any transcriptional errors that result from this process are unintentional.

## 2018-04-24 ENCOUNTER — Other Ambulatory Visit: Payer: Self-pay

## 2018-04-24 DIAGNOSIS — N390 Urinary tract infection, site not specified: Secondary | ICD-10-CM | POA: Diagnosis not present

## 2018-04-24 DIAGNOSIS — Z7951 Long term (current) use of inhaled steroids: Secondary | ICD-10-CM | POA: Diagnosis not present

## 2018-04-24 DIAGNOSIS — I10 Essential (primary) hypertension: Secondary | ICD-10-CM | POA: Diagnosis not present

## 2018-04-24 DIAGNOSIS — K219 Gastro-esophageal reflux disease without esophagitis: Secondary | ICD-10-CM | POA: Diagnosis not present

## 2018-04-24 DIAGNOSIS — Z7982 Long term (current) use of aspirin: Secondary | ICD-10-CM | POA: Diagnosis not present

## 2018-04-24 DIAGNOSIS — I252 Old myocardial infarction: Secondary | ICD-10-CM | POA: Diagnosis not present

## 2018-04-24 DIAGNOSIS — B9620 Unspecified Escherichia coli [E. coli] as the cause of diseases classified elsewhere: Secondary | ICD-10-CM | POA: Diagnosis not present

## 2018-04-24 DIAGNOSIS — J189 Pneumonia, unspecified organism: Secondary | ICD-10-CM | POA: Diagnosis not present

## 2018-04-24 DIAGNOSIS — F17210 Nicotine dependence, cigarettes, uncomplicated: Secondary | ICD-10-CM | POA: Diagnosis not present

## 2018-04-24 DIAGNOSIS — Z79899 Other long term (current) drug therapy: Secondary | ICD-10-CM | POA: Diagnosis not present

## 2018-04-24 DIAGNOSIS — E039 Hypothyroidism, unspecified: Secondary | ICD-10-CM | POA: Diagnosis not present

## 2018-04-24 DIAGNOSIS — Z955 Presence of coronary angioplasty implant and graft: Secondary | ICD-10-CM | POA: Diagnosis not present

## 2018-04-24 DIAGNOSIS — I2510 Atherosclerotic heart disease of native coronary artery without angina pectoris: Secondary | ICD-10-CM | POA: Diagnosis not present

## 2018-04-24 DIAGNOSIS — J440 Chronic obstructive pulmonary disease with acute lower respiratory infection: Secondary | ICD-10-CM | POA: Diagnosis not present

## 2018-04-24 DIAGNOSIS — F329 Major depressive disorder, single episode, unspecified: Secondary | ICD-10-CM | POA: Diagnosis not present

## 2018-04-24 DIAGNOSIS — E785 Hyperlipidemia, unspecified: Secondary | ICD-10-CM | POA: Diagnosis not present

## 2018-04-24 NOTE — Patient Outreach (Signed)
[facility]) Care Management  04/24/2018  Jodi Beasley 03-01-1946 [id number]   EMMI- General Discharge RED ON EMMI ALERT Day # 1 Date:  04/23/18 Red Alert Reason: Unfilled prescriptions? yes   Outreach attempt # 1 spoke with patient.  She is able to verify HIPAA.  Patient reports she is getting better and glad to be out of the hospital.  Patient states being out of the hospital has been a big help. Addressed red alert.  Patient reports she has all her medications.  Patient has follow up appointments this week and has transportation. [facility] is scheduled for a visit today. Patient voices no questions or concerns.     Plan: RN CM will send letter and close case.    [name], RN, MSN [facility] Care Management Care Management Coordinator Direct Line [phone] Toll Free: [phone]  Fax: [phone]

## 2018-04-25 ENCOUNTER — Telehealth: Payer: Self-pay | Admitting: Family Medicine

## 2018-04-25 NOTE — Telephone Encounter (Signed)
Verbal orders given  

## 2018-04-25 NOTE — Telephone Encounter (Signed)
Verbal orders can be given. 

## 2018-04-25 NOTE — Telephone Encounter (Signed)
Please advise 

## 2018-04-25 NOTE — Telephone Encounter (Signed)
Copied from [place] [id number]. Topic: General - Other >> Apr 25, 2018 12:08 PM Margot Ables wrote: Reason for CRM: requesting VO for SN 1x 8 weeks, HH aide 1x 8 weeks

## 2018-04-26 DIAGNOSIS — N179 Acute kidney failure, unspecified: Secondary | ICD-10-CM | POA: Diagnosis not present

## 2018-04-26 DIAGNOSIS — E872 Acidosis: Secondary | ICD-10-CM | POA: Diagnosis not present

## 2018-04-26 DIAGNOSIS — E871 Hypo-osmolality and hyponatremia: Secondary | ICD-10-CM | POA: Diagnosis not present

## 2018-04-26 DIAGNOSIS — I10 Essential (primary) hypertension: Secondary | ICD-10-CM | POA: Diagnosis not present

## 2018-04-26 DIAGNOSIS — N390 Urinary tract infection, site not specified: Secondary | ICD-10-CM | POA: Diagnosis not present

## 2018-04-27 ENCOUNTER — Encounter: Payer: Self-pay | Admitting: Family Medicine

## 2018-04-27 ENCOUNTER — Ambulatory Visit (INDEPENDENT_AMBULATORY_CARE_PROVIDER_SITE_OTHER): Payer: Medicare Other | Admitting: Family Medicine

## 2018-04-27 DIAGNOSIS — N1832 Chronic kidney disease, stage 3b: Secondary | ICD-10-CM | POA: Insufficient documentation

## 2018-04-27 DIAGNOSIS — A419 Sepsis, unspecified organism: Secondary | ICD-10-CM | POA: Insufficient documentation

## 2018-04-27 DIAGNOSIS — A4151 Sepsis due to Escherichia coli [E. coli]: Secondary | ICD-10-CM | POA: Diagnosis not present

## 2018-04-27 DIAGNOSIS — E871 Hypo-osmolality and hyponatremia: Secondary | ICD-10-CM | POA: Diagnosis not present

## 2018-04-27 DIAGNOSIS — N183 Chronic kidney disease, stage 3 unspecified: Secondary | ICD-10-CM | POA: Insufficient documentation

## 2018-04-27 DIAGNOSIS — N179 Acute kidney failure, unspecified: Secondary | ICD-10-CM

## 2018-04-27 HISTORY — DX: Sepsis, unspecified organism: A41.9

## 2018-04-27 NOTE — Patient Instructions (Signed)
Nice to see you. I am glad you are doing better. We will await notes from your kidney specialist. Please see infectious disease as planned. We will take some time for you to regain your strength.  Please move slowly and be careful. If you have recurrence of any of your symptoms please be reevaluated.

## 2018-04-27 NOTE — Assessment & Plan Note (Signed)
Patient with acute kidney injury likely related to diarrhea and dehydration.  She reports having had labs through nephrology yesterday and we will await their note.  She is now on fluid restriction through nephrology.  She will continue to see them for follow-up.

## 2018-04-27 NOTE — Progress Notes (Signed)
[name], MD Phone: [phone]  Jodi Beasley is a 72 y.o. female who presents today for hospital follow-up.  Patient was hospitalized from 04/16/2018-04/21/2018 for E. coli sepsis, UTI, and diarrhea.  Symptoms started with weakness and achiness as well as diarrhea.  This lasted for a couple of days and then she fell 3x 2 days after her symptoms started.  She subsequently called EMS and was transported to the emergency department.  She was found to have a sodium of 115.  She was presumed to have possible atypical pneumonia though she noted no respiratory complaints and her chest x-ray was unremarkable for pneumonia.  She was started on IV antibiotics to cover for pneumonia.  She was admitted to the hospital for evaluation and treatment of hyponatremia.  She was subsequently found to have E. coli sepsis with blood cultures being positive for E. coli.  Urine culture had 10,000 colonies of E. coli.  She was treated with IV antibiotics for this.  Her diarrhea was also tested for C. difficile which was negative.  Her white blood cell count was quite elevated though did trend down.  She had AKI.  She was evaluated by infectious disease as well as nephrology.  She reports she saw nephrology yesterday and had labs with them.  She reports they rechecked sodium and renal function as well as her white blood cell count.  She has follow-up with infectious disease in a couple of weeks.  She has improved with regards to her symptoms.  Her diarrhea has improved and her stools are becoming more formed.  She notes no urinary symptoms.  She remains on Keflex.  She has had no respiratory symptoms.  She declined home health as she felt like she was improving enough.  She does continue to use her walker.  Social History   Tobacco Use  Smoking Status Current Every Day Smoker  . Packs/day: 1.00  . Years: 53.00  . Pack years: 53.00  . Types: E-cigarettes, Cigarettes  . Start date: 11/19/1960  Smokeless Tobacco  Never Used  Tobacco Comment   down to 0.75ppd     ROS see history of present illness  Objective  Physical Exam Vitals:   04/27/18 1122  BP: 120/70  Pulse: 74  Temp: 98.6 F (37 C)  SpO2: 99%    BP Readings from Last 3 Encounters:  04/27/18 120/70  04/21/18 (!) 145/79  03/27/18 124/76   Wt Readings from Last 3 Encounters:  04/27/18 155 lb 12.8 oz (70.7 kg)  04/21/18 175 lb 3.2 oz (79.5 kg)  03/27/18 159 lb 3.2 oz (72.2 kg)    Physical Exam  Constitutional: No distress.  Cardiovascular: Normal rate, regular rhythm and normal heart sounds.  Pulmonary/Chest: Effort normal and breath sounds normal.  Abdominal: Soft. Bowel sounds are normal. She exhibits no distension. There is no tenderness. There is no rebound and no guarding.  Musculoskeletal: She exhibits no edema.  Neurological: She is alert.  Skin: Skin is warm and dry. She is not diaphoretic.     Assessment/Plan: Please see individual problem list.  Sepsis [facility]) Patient recently hospitalized for E. coli sepsis and hyponatremia.  Overall she has been improving.  I suspect the source of her E. coli sepsis was her diarrhea and possibly UTI.  It would seem less likely that she would have pneumonia given her symptoms and absence of findings on chest x-ray.  Symptoms have significantly improved.  She continues on Keflex.  She will finish her course of Keflex and  follow-up with infectious disease as planned.  If she has recurrence of any symptoms she will be reevaluated.  Slight deconditioning from being in the hospital though she has improved quite a bit per her report.  I encouraged her to continue to use her walker.  AKI (acute kidney injury) [facility]) Patient with acute kidney injury likely related to diarrhea and dehydration.  She reports having had labs through nephrology yesterday and we will await their note.  She is now on fluid restriction through nephrology.  She will continue to see them for  follow-up.  Hyponatremia Likely related to hypovolemia.  She will continue to see nephrology.   No orders of the defined types were placed in this encounter.   No orders of the defined types were placed in this encounter.    [name], MD [place]

## 2018-04-27 NOTE — Assessment & Plan Note (Signed)
Likely related to hypovolemia.  She will continue to see nephrology.

## 2018-04-27 NOTE — Assessment & Plan Note (Addendum)
Patient recently hospitalized for E. coli sepsis and hyponatremia.  Overall she has been improving.  I suspect the source of her E. coli sepsis was her diarrhea and possibly UTI.  It would seem less likely that she would have pneumonia given her symptoms and absence of findings on chest x-ray.  Symptoms have significantly improved.  She continues on Keflex.  She will finish her course of Keflex and follow-up with infectious disease as planned.  If she has recurrence of any symptoms she will be reevaluated.  Slight deconditioning from being in the hospital though she has improved quite a bit per her report.  I encouraged her to continue to use her walker.

## 2018-05-02 DIAGNOSIS — E872 Acidosis: Secondary | ICD-10-CM | POA: Diagnosis not present

## 2018-05-02 DIAGNOSIS — E871 Hypo-osmolality and hyponatremia: Secondary | ICD-10-CM | POA: Diagnosis not present

## 2018-05-02 DIAGNOSIS — N390 Urinary tract infection, site not specified: Secondary | ICD-10-CM | POA: Diagnosis not present

## 2018-05-02 DIAGNOSIS — I10 Essential (primary) hypertension: Secondary | ICD-10-CM | POA: Diagnosis not present

## 2018-05-02 DIAGNOSIS — N179 Acute kidney failure, unspecified: Secondary | ICD-10-CM | POA: Diagnosis not present

## 2018-05-08 DIAGNOSIS — M9901 Segmental and somatic dysfunction of cervical region: Secondary | ICD-10-CM | POA: Diagnosis not present

## 2018-05-08 DIAGNOSIS — M5033 Other cervical disc degeneration, cervicothoracic region: Secondary | ICD-10-CM | POA: Diagnosis not present

## 2018-05-08 DIAGNOSIS — M5416 Radiculopathy, lumbar region: Secondary | ICD-10-CM | POA: Diagnosis not present

## 2018-05-08 DIAGNOSIS — M9903 Segmental and somatic dysfunction of lumbar region: Secondary | ICD-10-CM | POA: Diagnosis not present

## 2018-05-18 ENCOUNTER — Other Ambulatory Visit: Payer: Self-pay

## 2018-05-18 ENCOUNTER — Encounter: Payer: Self-pay | Admitting: Emergency Medicine

## 2018-05-18 ENCOUNTER — Ambulatory Visit
Admission: EM | Admit: 2018-05-18 | Discharge: 2018-05-18 | Disposition: A | Discharge status: Home / Self Care | Payer: Medicare Other | Attending: Family Medicine | Admitting: Family Medicine

## 2018-05-18 ENCOUNTER — Ambulatory Visit: Payer: Self-pay | Admitting: *Deleted

## 2018-05-18 ENCOUNTER — Ambulatory Visit: Payer: Self-pay | Admitting: Family Medicine

## 2018-05-18 DIAGNOSIS — Z888 Allergy status to other drugs, medicaments and biological substances status: Secondary | ICD-10-CM | POA: Insufficient documentation

## 2018-05-18 DIAGNOSIS — I493 Ventricular premature depolarization: Secondary | ICD-10-CM | POA: Insufficient documentation

## 2018-05-18 DIAGNOSIS — E7800 Pure hypercholesterolemia, unspecified: Secondary | ICD-10-CM | POA: Insufficient documentation

## 2018-05-18 DIAGNOSIS — K219 Gastro-esophageal reflux disease without esophagitis: Secondary | ICD-10-CM | POA: Insufficient documentation

## 2018-05-18 DIAGNOSIS — Z79899 Other long term (current) drug therapy: Secondary | ICD-10-CM | POA: Insufficient documentation

## 2018-05-18 DIAGNOSIS — E039 Hypothyroidism, unspecified: Secondary | ICD-10-CM | POA: Insufficient documentation

## 2018-05-18 DIAGNOSIS — I252 Old myocardial infarction: Secondary | ICD-10-CM | POA: Insufficient documentation

## 2018-05-18 DIAGNOSIS — J449 Chronic obstructive pulmonary disease, unspecified: Secondary | ICD-10-CM | POA: Diagnosis not present

## 2018-05-18 DIAGNOSIS — Z8249 Family history of ischemic heart disease and other diseases of the circulatory system: Secondary | ICD-10-CM | POA: Insufficient documentation

## 2018-05-18 DIAGNOSIS — Z800 Family history of malignant neoplasm of digestive organs: Secondary | ICD-10-CM | POA: Insufficient documentation

## 2018-05-18 DIAGNOSIS — Z79890 Hormone replacement therapy (postmenopausal): Secondary | ICD-10-CM | POA: Insufficient documentation

## 2018-05-18 DIAGNOSIS — I10 Essential (primary) hypertension: Secondary | ICD-10-CM | POA: Diagnosis not present

## 2018-05-18 DIAGNOSIS — R002 Palpitations: Secondary | ICD-10-CM | POA: Diagnosis not present

## 2018-05-18 DIAGNOSIS — F17210 Nicotine dependence, cigarettes, uncomplicated: Secondary | ICD-10-CM | POA: Diagnosis not present

## 2018-05-18 DIAGNOSIS — I2510 Atherosclerotic heart disease of native coronary artery without angina pectoris: Secondary | ICD-10-CM | POA: Insufficient documentation

## 2018-05-18 DIAGNOSIS — F329 Major depressive disorder, single episode, unspecified: Secondary | ICD-10-CM | POA: Diagnosis not present

## 2018-05-18 DIAGNOSIS — Z7982 Long term (current) use of aspirin: Secondary | ICD-10-CM | POA: Diagnosis not present

## 2018-05-18 NOTE — ED Provider Notes (Signed)
[facility]    CSN: [id number] Arrival date & time: 05/18/18  1715     History   Chief Complaint Chief Complaint  Patient presents with  . Hypertension    HPI Jodi Beasley is a 72 y.o. female.   72 yo female with a c/o 2 episodes of "palpitations" today. States she just "felt my heart beat and normally I don't feel it". Denies feeling like her heart was beating fast or skipping beats, states it was just that she could feel them. States episodes lasted a few minutes then resolved and were not associated with any other symptoms. Denies any chest pains/pressure, shortness of breath, diaphoresis, neck/jaw or arm pain. Currently feels fine; no symptoms. States she just wanted "to get checked out" and wondering if this has anything to do with the fact that her metoprolol dose was decreased about 1 month ago.   The history is provided by the patient.  Hypertension     Past Medical History:  Diagnosis Date  . Arrhythmia   . Chicken pox   . COPD (chronic obstructive pulmonary disease) ([place])   . Coronary artery disease    NSTEMI in 09/2008. Cath : 80% RCA and 95% first diagonal. PCI and 2 DES placement  RCA and 1 DES to diagonal. Complicated by acute diagonal stent thrombosis . Treated by thrombectomy. Most recent nuclear stress test in 2010 showed no ischemia with normal EF.   Marland Kitchen Depression   . GERD (gastroesophageal reflux disease)   . Hay fever   . History of blood transfusion   . History of hypercholesterolemia   . History of syncope 2000   negative EP study  . Hyperlipidemia    intolerance to statins except Crestor.   . Hypertension   . Hypothyroid   . MI (myocardial infarction) ([place])   . Pneumonia, organism unspecified(486)     Patient Active Problem List   Diagnosis Date Noted  . Sepsis ([place]) 04/27/2018  . AKI (acute kidney injury) ([place]) 04/27/2018  . Problems influencing health status 12/27/2017  . Osteoarthritis of AC (acromioclavicular) joint (Right)  12/27/2017  . Grade 1 Anterolisthesis of L4 on L5 12/27/2017  . DDD (degenerative disc disease), lumbar 12/27/2017  . Lumbar spondylosis 12/27/2017  . Lumbar central spinal stenosis (L4-5) 12/27/2017  . Lumbar foraminal stenosis (L4-5) (Bilateral) 12/27/2017  . Lumbar lateral recess stenosis (L4-5) (Bilateral) 12/27/2017  . Osteoarthritis of lumbar facet joint (Bilateral) 12/27/2017  . Lumbar facet syndrome (Bilateral) (L>R) 12/27/2017  . Chronic low back pain (Primary Area of Pain) (Bilateral) (L>R) 12/12/2017  . Chronic lower extremity pain (Secondary Area of Pain) (Left) 12/12/2017  . Long term current use of opiate analgesic 12/12/2017  . Chronic pain syndrome 12/12/2017  . Disorder of skeletal system 12/12/2017  . Other long term (current) drug therapy 12/12/2017  . Other specified health status 12/12/2017  . Sinusitis 07/26/2017  . Rash 03/31/2017  . Fall 02/23/2017  . Rotator cuff impingement syndrome (Right) 09/26/2016  . Epidermal cyst 03/02/2016  . Hand pain 03/02/2016  . Hyponatremia 03/02/2016  . Hypercholesterolemia 10/21/2015  . Osteopenia 07/20/2015  . De Quervain's tenosynovitis (Right) 01/16/2015  . Pharmacologic therapy 11/19/2013  . Adjustment disorder with mixed anxiety and depressed mood 11/19/2013  . Hypothyroidism 01/24/2013  . Allergic rhinitis 01/21/2013  . Hypertension   . Coronary artery disease 04/19/2012  . Atrophic vaginitis 04/19/2012  . COPD (chronic obstructive pulmonary disease) with chronic bronchitis ([place]) 04/09/2012  . Tobacco abuse 04/09/2012    Past  Surgical History:  Procedure Laterality Date  . CORONARY ANGIOPLASTY WITH STENT PLACEMENT  2009   [facility], drug-eluting mid RCA,prox diag;  . TONSILLECTOMY    . TUBAL LIGATION      OB History   None      Home Medications    Prior to Admission medications   Medication Sig Start Date End Date Taking? Authorizing Provider  albuterol (PROAIR HFA) 108 (90 Base) MCG/ACT inhaler  Inhale 1-2 puffs into the lungs every 6 (six) hours as needed for wheezing or shortness of breath. 06/27/17   Juanito Doom, MD  aspirin 81 MG tablet Take 81 mg by mouth daily.    [provider]  atorvastatin (LIPITOR) 80 MG tablet TAKE 1 TABLET(80 MG) BY MOUTH DAILY 04/09/18   Leone Haven, MD  BREO ELLIPTA 100-25 MCG/INH AEPB INHALE 1 PUFF BY MOUTH EVERY DAY 02/07/18   Juanito Doom, MD  buPROPion (WELLBUTRIN XL) 150 MG 24 hr tablet TAKE 1 TABLET(150 MG) BY MOUTH DAILY 03/27/18   Leone Haven, MD  carisoprodol (SOMA) 350 MG tablet TAKE 1 TABLET BY MOUTH TWICE DAILY AS NEEDED FOR MUSCLE SPASMS 03/28/18   Leone Haven, MD  Cholecalciferol (VITAMIN D-3) 1000 UNITS CAPS Take 1 capsule by mouth daily.    [provider]  citalopram (CELEXA) 40 MG tablet TAKE 1 TABLET(40 MG) BY MOUTH DAILY 03/27/18   Leone Haven, MD  Coenzyme Q10 (COQ10) 400 MG CAPS Take 400 mg by mouth daily.    [provider]  fluticasone (FLONASE) 50 MCG/ACT nasal spray Place 2 sprays into both nostrils daily. 03/27/18   Leone Haven, MD  levothyroxine (SYNTHROID, LEVOTHROID) 112 MCG tablet TAKE 1 TABLET BY MOUTH EVERY DAY BEFORE BREAKFAST 10/30/17   Leone Haven, MD  metoprolol tartrate (LOPRESSOR) 25 MG tablet Take 1 tablet (25 mg total) by mouth 2 (two) times daily. Patient taking differently: Take 50 mg by mouth daily.  04/21/18 05/21/18  Saundra Shelling, MD  Multiple Vitamin (MULTIVITAMIN) capsule Take 1 capsule by mouth daily.    [provider]  nitroGLYCERIN (NITROSTAT) 0.4 MG SL tablet Place 1 tablet (0.4 mg total) under the tongue every 5 (five) minutes as needed. 01/10/17   Croitoru, Mihai, MD  omeprazole (PRILOSEC) 20 MG capsule TAKE 1 CAPSULE BY MOUTH EVERY DAY 02/06/18   Leone Haven, MD  sodium bicarbonate 650 MG tablet Take 1 tablet (650 mg total) by mouth 2 (two) times daily. 04/21/18 05/21/18  Saundra Shelling, MD  vitamin B-12 (CYANOCOBALAMIN)  100 MCG tablet Take 100 mcg by mouth daily.    [provider]    Family History Family History  Problem Relation Age of Onset  . Colon cancer Father   . Lung cancer Father        was a former smoker  . Cancer Father        lung  . Diabetes Father   . Hypertension Mother   . Hypertension Unknown   . Diabetes Unknown     Social History Social History   Tobacco Use  . Smoking status: Current Every Day Smoker    Packs/day: 1.00    Years: 53.00    Pack years: 53.00    Types: E-cigarettes, Cigarettes    Start date: 11/19/1960  . Smokeless tobacco: Never Used  . Tobacco comment: down to 0.75ppd  Substance Use Topics  . Alcohol use: Yes    Alcohol/week: 0.6 oz    Types: 1 Standard  drinks or equivalent per week  . Drug use: No     Allergies   Benzocaine; Darvon [propoxyphene hcl]; Monosodium glutamate; Neosporin [neomycin-bacitracin zn-polymyx]; Nicoderm [nicotine]; and Prednisone   Review of Systems Review of Systems   Physical Exam Triage Vital Signs ED Triage Vitals  Enc Vitals Group     BP 05/18/18 1737 (!) 157/87     Pulse Rate 05/18/18 1737 68     Resp 05/18/18 1737 16     Temp 05/18/18 1737 98.8 F (37.1 C)     Temp Source 05/18/18 1737 Oral     SpO2 05/18/18 1737 98 %     Weight 05/18/18 1734 152 lb (68.9 kg)     Height 05/18/18 1734 5\' 6"  (1.676 m)     Head Circumference --      Peak Flow --      Pain Score 05/18/18 1734 0     Pain Loc --      Pain Edu? --      Excl. in [place]? --    No data found.  Updated Vital Signs BP (!) 157/87 (BP Location: Right Arm)   Pulse 68   Temp 98.8 F (37.1 C) (Oral)   Resp 16   Ht 5\' 6"  (1.676 m)   Wt 152 lb (68.9 kg)   SpO2 98%   BMI 24.53 kg/m   Visual Acuity Right Eye Distance:   Left Eye Distance:   Bilateral Distance:    Right Eye Near:   Left Eye Near:    Bilateral Near:     Physical Exam  Constitutional: She appears well-developed and well-nourished. No distress.  HENT:  Head:  Normocephalic and atraumatic.  Nose: No mucosal edema, rhinorrhea, nose lacerations, sinus tenderness, nasal deformity, septal deviation or nasal septal hematoma. No epistaxis.  No foreign bodies. Right sinus exhibits no maxillary sinus tenderness and no frontal sinus tenderness. Left sinus exhibits no maxillary sinus tenderness and no frontal sinus tenderness.  Mouth/Throat: Uvula is midline, oropharynx is clear and moist and mucous membranes are normal. No oropharyngeal exudate.  Neck: Normal range of motion. Neck supple. No thyromegaly present.  Cardiovascular: Normal rate, regular rhythm, normal heart sounds and intact distal pulses.  No murmur heard. Pulmonary/Chest: Effort normal and breath sounds normal. No stridor. No respiratory distress. She has no wheezes. She has no rales.  Lymphadenopathy:    She has no cervical adenopathy.  Skin: She is not diaphoretic.  Nursing note and vitals reviewed.    UC Treatments / Results  Labs (all labs ordered are listed, but only abnormal results are displayed) Labs Reviewed - No data to display  EKG None  Radiology No results found.  Procedures ED EKG Date/Time: 05/18/2018 8:51 PM Performed by: Norval Gable, MD Authorized by: Norval Gable, MD   ECG reviewed by ED Physician in the absence of a cardiologist: yes   Previous ECG:    Previous ECG:  Compared to current   Comparison ECG info:  Occasional PVCs in previous EKGs as well   Similarity:  No change Interpretation:    Interpretation: abnormal   Rate:    ECG rate assessment: normal   Rhythm:    Rhythm: sinus rhythm   Ectopy:    Ectopy: PVCs     PVCs:  Infrequent QRS:    QRS axis:  Normal Conduction:    Conduction: normal   ST segments:    ST segments:  Normal T waves:    T waves: normal     (  including critical care time)  Medications Ordered in UC Medications - No data to display  Initial Impression / Assessment and Plan / UC Course  I have reviewed the triage  vital signs and the nursing notes.  Pertinent labs & imaging results that were available during my care of the patient were reviewed by me and considered in my medical decision making (see chart for details).      Final Clinical Impressions(s) / UC Diagnoses   Final diagnoses:  Essential hypertension  Intermittent palpitations     Discharge Instructions     Follow up next week with Primary Care provider Continue current medications as they are    ED Prescriptions    None     1. ekg result and diagnosis reviewed with patient 2. Reassured patient and recommended continuing current medications without changes and following up with PCP next week  Controlled Substance Prescriptions [place] Controlled Substance Registry consulted? Not Applicable   Norval Gable, MD 05/18/18 2100

## 2018-05-18 NOTE — Telephone Encounter (Signed)
Duplicate triage encounter please disregard.

## 2018-05-18 NOTE — Telephone Encounter (Signed)
Pt reports had symptoms of a pounding sensation in chest several hours ago that lasted about 1  And 1/2 hours . She denied any chest pain or shortness of breath. She felt anxious she reports has been on  Lopressor 100 mg bid for many years and was  Reduced by kidney dr last month to lopressor 50 mg daily - due to kidney issues . She is alert and oriented at this time and reports no symptoms now - she reports the symptomswent away.Pt advised per protocall to be seen within 4 hours she was advised to go to an urgent care / er . She was given directions to urgent care in [place]. Reason for Disposition . Age > 60 years (Exception: brief heart beat symptoms that went away and now feels well)  Answer Assessment - Initial Assessment Questions 1. DESCRIPTION: "Please describe your heart rate or heart beat that you are having" (e.g., fast/slow, regular/irregular, skipped or extra beats, "palpitations")     Pt reports sensation of heart beating in a pounding sensation about 2 hour ago now the symptoms are better   2. ONSET: "When did it start?" (Minutes, hours or days)       Started about 2 hours ago   3. DURATION: "How long does it last" (e.g., seconds, minutes, hours)       About  1 and  1/2  Hour ago   4. PATTERN "Does it come and go, or has it been constant since it started?"  "Does it get worse with exertion?"   "Are you feeling it now?"       5. TAP: "Using your hand, can you tap out what you are feeling on a chair or table in front of you, so that I can hear?" (Note: not all patients can do this)          Beating regular   6. HEART RATE: "Can you tell me your heart rate?" "How many beats in 15 seconds?"  (Note: not all patients can do this)         70 7. RECURRENT SYMPTOM: "Have you ever had this before?" If so, ask: "When was the last time?" and "What happened that time?"         Yes  Many years ago prior to having cardiac stints put in   8. CAUSE: "What do you think is causing the palpitations?"         Poss reduction in lopressor  dosage    9. CARDIAC HISTORY: "Do you have any history of heart disease?" (e.g., heart attack, angina, bypass surgery, angioplasty, arrhythmia)        Heart attack  With stings   10. OTHER SYMPTOMS: "Do you have any other symptoms?" (e.g., dizziness, chest pain, sweating, difficulty breathing)        Anxious   11. PREGNANCY: "Is there any chance you are pregnant?" "When was your last menstrual period?"       n/a  Protocols used: HEART RATE AND HEARTBEAT QUESTIONS-A-AH

## 2018-05-18 NOTE — Discharge Instructions (Addendum)
Follow up next week with Primary Care provider Continue current medications as they are

## 2018-05-18 NOTE — Telephone Encounter (Signed)
Tried to reach patient by phone to make sure she is going to be evaluated left message could not reach patient. FYI

## 2018-05-18 NOTE — ED Triage Notes (Addendum)
Patient states that she was in the hospital a month ago and they reduced her Metoprolol to 50mg  once a day. Patient reports that since then her BP has been elevated and today's reading was 137/83.  Patient states that she also has had some heart palpitation this morning.  Patient denies chest pain or SOB.

## 2018-05-19 ENCOUNTER — Encounter: Payer: Self-pay | Admitting: Family Medicine

## 2018-05-23 DIAGNOSIS — M9903 Segmental and somatic dysfunction of lumbar region: Secondary | ICD-10-CM | POA: Diagnosis not present

## 2018-05-23 DIAGNOSIS — M5033 Other cervical disc degeneration, cervicothoracic region: Secondary | ICD-10-CM | POA: Diagnosis not present

## 2018-05-23 DIAGNOSIS — M9901 Segmental and somatic dysfunction of cervical region: Secondary | ICD-10-CM | POA: Diagnosis not present

## 2018-05-23 DIAGNOSIS — M5416 Radiculopathy, lumbar region: Secondary | ICD-10-CM | POA: Diagnosis not present

## 2018-05-24 DIAGNOSIS — M5033 Other cervical disc degeneration, cervicothoracic region: Secondary | ICD-10-CM | POA: Diagnosis not present

## 2018-05-24 DIAGNOSIS — M5416 Radiculopathy, lumbar region: Secondary | ICD-10-CM | POA: Diagnosis not present

## 2018-05-24 DIAGNOSIS — M9903 Segmental and somatic dysfunction of lumbar region: Secondary | ICD-10-CM | POA: Diagnosis not present

## 2018-05-24 DIAGNOSIS — M9901 Segmental and somatic dysfunction of cervical region: Secondary | ICD-10-CM | POA: Diagnosis not present

## 2018-06-01 ENCOUNTER — Ambulatory Visit: Payer: Self-pay | Admitting: Family Medicine

## 2018-06-06 DIAGNOSIS — M5033 Other cervical disc degeneration, cervicothoracic region: Secondary | ICD-10-CM | POA: Diagnosis not present

## 2018-06-06 DIAGNOSIS — M9903 Segmental and somatic dysfunction of lumbar region: Secondary | ICD-10-CM | POA: Diagnosis not present

## 2018-06-06 DIAGNOSIS — M5416 Radiculopathy, lumbar region: Secondary | ICD-10-CM | POA: Diagnosis not present

## 2018-06-06 DIAGNOSIS — M9901 Segmental and somatic dysfunction of cervical region: Secondary | ICD-10-CM | POA: Diagnosis not present

## 2018-06-07 IMAGING — MG MM DIGITAL SCREENING BILAT W/ TOMO W/ CAD
8 of 13 series · 8 of 29 positions shown · non-contrast
Comparison: Previous exam(s).

CLINICAL DATA: Screening.

EXAM:
2D DIGITAL SCREENING BILATERAL MAMMOGRAM WITH CAD AND ADJUNCT TOMO

[L MLO (1 of 2)]
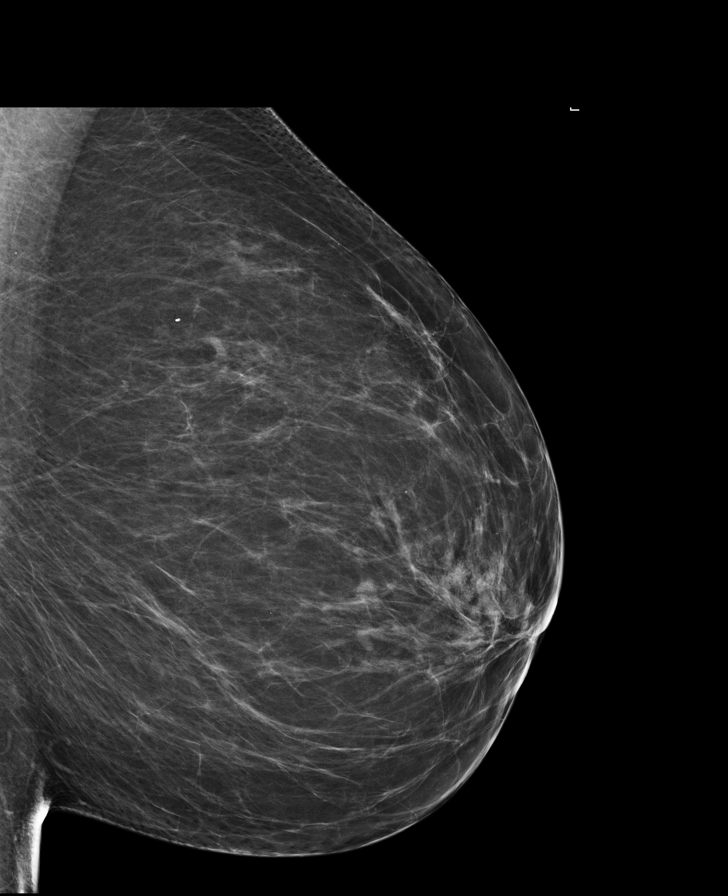

[L CC]
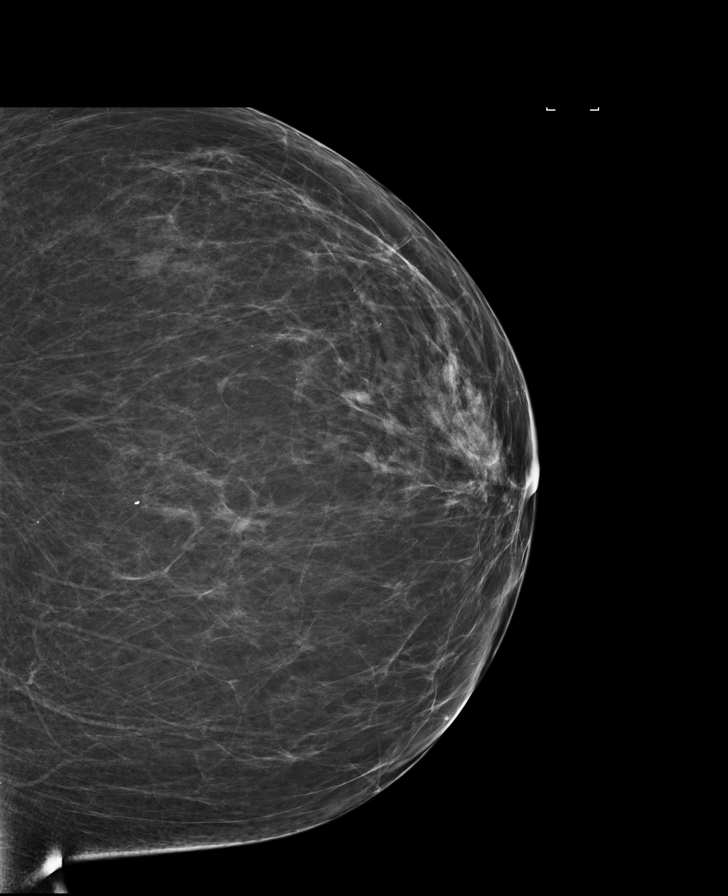

[R CC synth-2D]
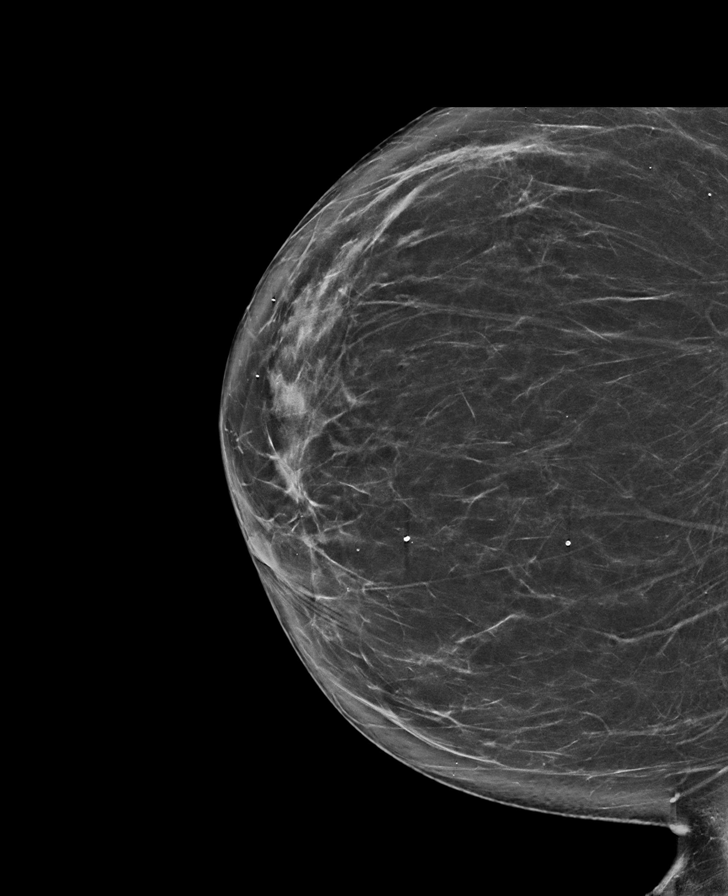

[L CC synth-2D]
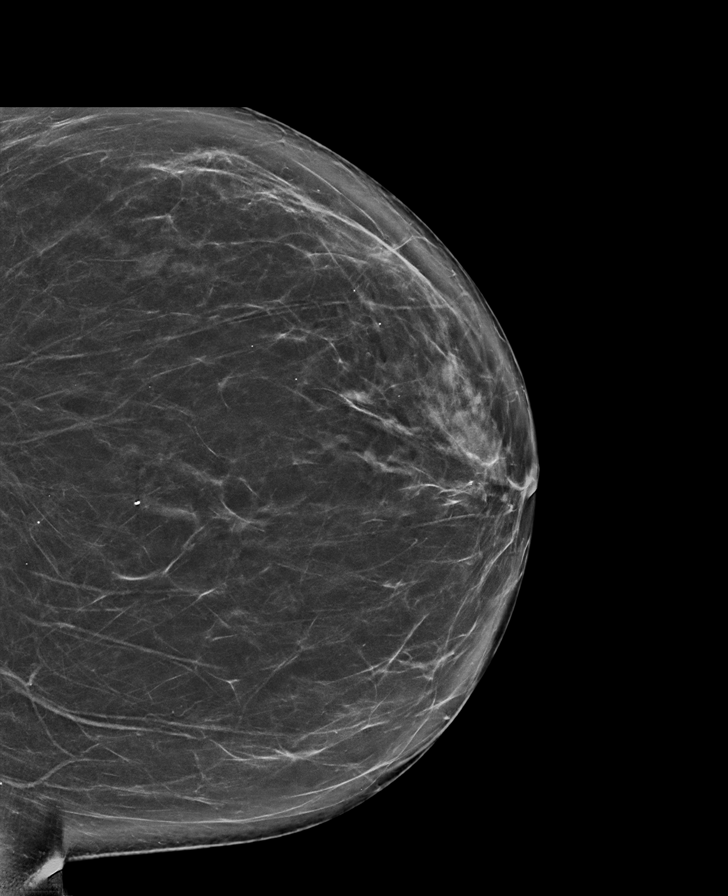

[R MLO synth-2D]
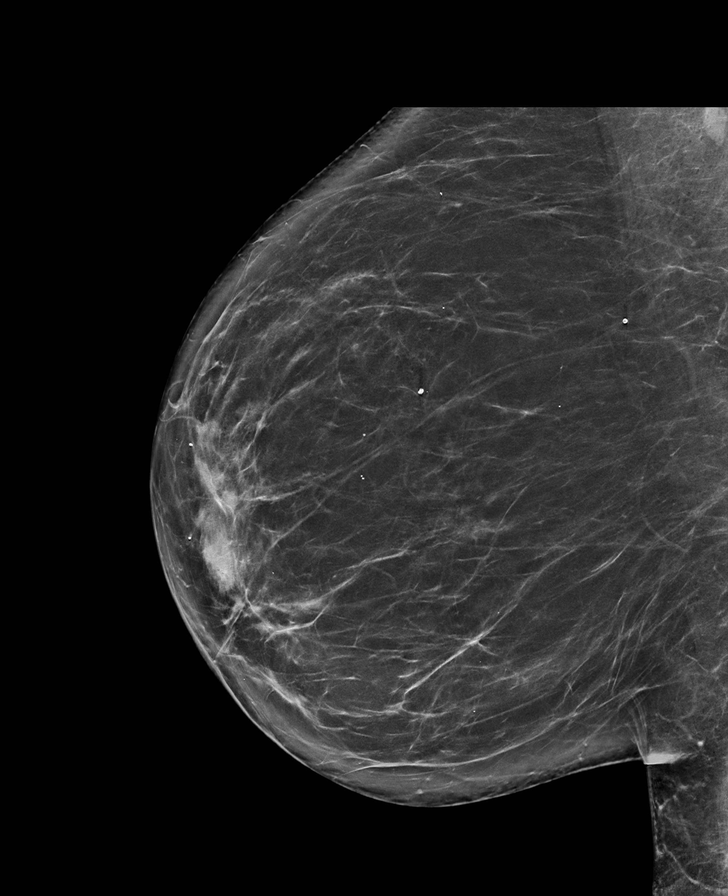

[R CC]
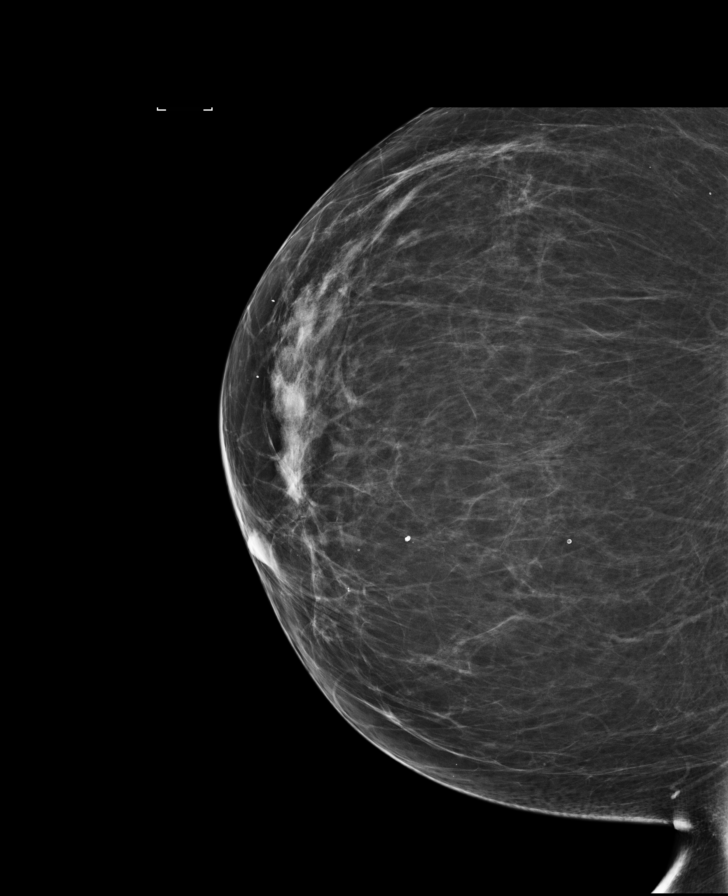

[L MLO synth-2D]
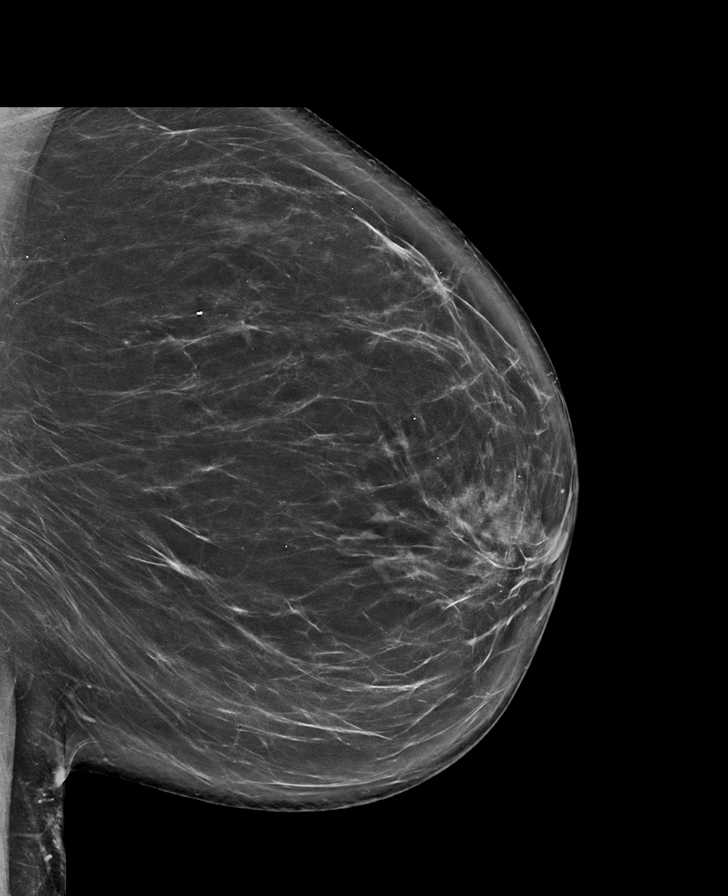

[L MLO (2 of 2)]
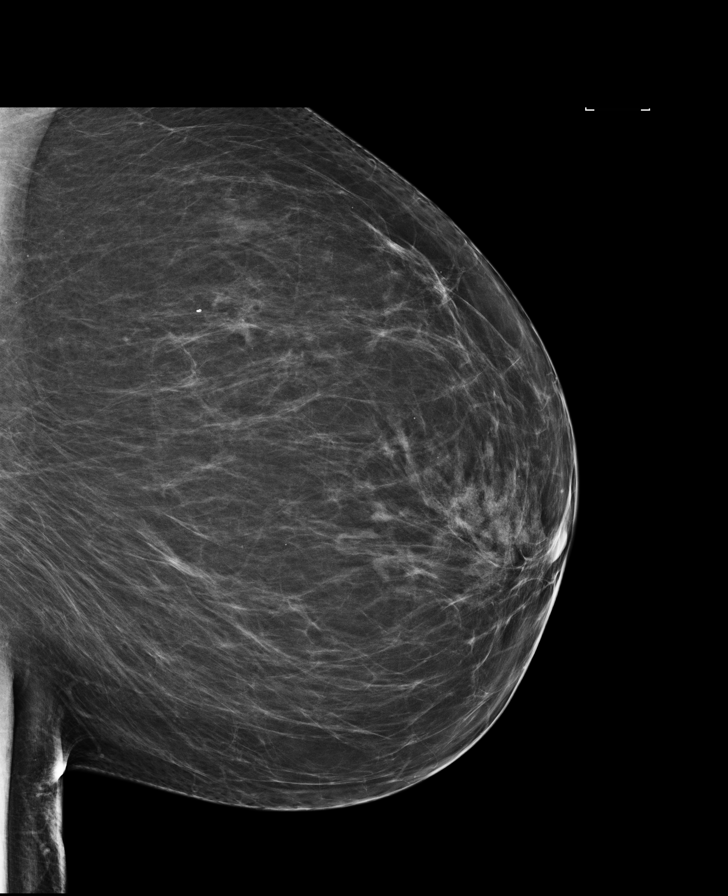

[8 of 29 positions shown; findings below may reference images not displayed]

ACR Breast Density Category b: There are scattered areas of
fibroglandular density.
FINDINGS: There are no findings suspicious for malignancy. Images were
processed with CAD.
IMPRESSION: No mammographic evidence of malignancy. A result letter of this
screening mammogram will be mailed directly to the patient.

RECOMMENDATION:
Screening mammogram in one year. (Code:97-6-RS4)

BI-RADS CATEGORY  1: Negative.

## 2018-06-08 ENCOUNTER — Inpatient Hospital Stay: Payer: Self-pay | Admitting: Family Medicine

## 2018-06-11 DIAGNOSIS — N179 Acute kidney failure, unspecified: Secondary | ICD-10-CM | POA: Diagnosis not present

## 2018-06-13 ENCOUNTER — Ambulatory Visit
Admission: RE | Admit: 2018-06-13 | Discharge: 2018-06-13 | Disposition: A | Discharge status: Home / Self Care | Payer: Medicare Other | Source: Ambulatory Visit | Attending: Family Medicine | Admitting: Family Medicine

## 2018-06-13 DIAGNOSIS — Z1239 Encounter for other screening for malignant neoplasm of breast: Secondary | ICD-10-CM

## 2018-06-13 DIAGNOSIS — Z1231 Encounter for screening mammogram for malignant neoplasm of breast: Secondary | ICD-10-CM | POA: Diagnosis not present

## 2018-06-15 ENCOUNTER — Ambulatory Visit: Payer: Self-pay | Admitting: Cardiovascular Disease

## 2018-06-19 DIAGNOSIS — M5416 Radiculopathy, lumbar region: Secondary | ICD-10-CM | POA: Diagnosis not present

## 2018-06-19 DIAGNOSIS — M9901 Segmental and somatic dysfunction of cervical region: Secondary | ICD-10-CM | POA: Diagnosis not present

## 2018-06-19 DIAGNOSIS — M9903 Segmental and somatic dysfunction of lumbar region: Secondary | ICD-10-CM | POA: Diagnosis not present

## 2018-06-19 DIAGNOSIS — M5033 Other cervical disc degeneration, cervicothoracic region: Secondary | ICD-10-CM | POA: Diagnosis not present

## 2018-06-28 DIAGNOSIS — L820 Inflamed seborrheic keratosis: Secondary | ICD-10-CM | POA: Diagnosis not present

## 2018-06-28 DIAGNOSIS — D225 Melanocytic nevi of trunk: Secondary | ICD-10-CM | POA: Diagnosis not present

## 2018-06-28 DIAGNOSIS — D1801 Hemangioma of skin and subcutaneous tissue: Secondary | ICD-10-CM | POA: Diagnosis not present

## 2018-06-28 DIAGNOSIS — L821 Other seborrheic keratosis: Secondary | ICD-10-CM | POA: Diagnosis not present

## 2018-06-28 DIAGNOSIS — Z1283 Encounter for screening for malignant neoplasm of skin: Secondary | ICD-10-CM | POA: Diagnosis not present

## 2018-06-28 DIAGNOSIS — L812 Freckles: Secondary | ICD-10-CM | POA: Diagnosis not present

## 2018-07-03 DIAGNOSIS — M5033 Other cervical disc degeneration, cervicothoracic region: Secondary | ICD-10-CM | POA: Diagnosis not present

## 2018-07-03 DIAGNOSIS — M9903 Segmental and somatic dysfunction of lumbar region: Secondary | ICD-10-CM | POA: Diagnosis not present

## 2018-07-03 DIAGNOSIS — M9901 Segmental and somatic dysfunction of cervical region: Secondary | ICD-10-CM | POA: Diagnosis not present

## 2018-07-03 DIAGNOSIS — M5416 Radiculopathy, lumbar region: Secondary | ICD-10-CM | POA: Diagnosis not present

## 2018-07-10 ENCOUNTER — Other Ambulatory Visit: Payer: Self-pay | Admitting: Family Medicine

## 2018-07-13 ENCOUNTER — Other Ambulatory Visit: Payer: Self-pay | Admitting: Family Medicine

## 2018-07-13 NOTE — Telephone Encounter (Signed)
Controlled substance database reviewed. Sent to pharmacy.   

## 2018-07-13 NOTE — Telephone Encounter (Signed)
Last OV 04/27/18 last filled 03/28/18 60 0rf

## 2018-07-24 DIAGNOSIS — M9903 Segmental and somatic dysfunction of lumbar region: Secondary | ICD-10-CM | POA: Diagnosis not present

## 2018-07-24 DIAGNOSIS — M9901 Segmental and somatic dysfunction of cervical region: Secondary | ICD-10-CM | POA: Diagnosis not present

## 2018-07-24 DIAGNOSIS — M5033 Other cervical disc degeneration, cervicothoracic region: Secondary | ICD-10-CM | POA: Diagnosis not present

## 2018-07-24 DIAGNOSIS — M5416 Radiculopathy, lumbar region: Secondary | ICD-10-CM | POA: Diagnosis not present

## 2018-07-26 ENCOUNTER — Ambulatory Visit (INDEPENDENT_AMBULATORY_CARE_PROVIDER_SITE_OTHER): Payer: Medicare Other | Admitting: Pulmonary Disease

## 2018-07-26 ENCOUNTER — Encounter: Payer: Self-pay | Admitting: Pulmonary Disease

## 2018-07-26 VITALS — BP 134/72 | HR 78 | Ht 63.19 in | Wt 150.8 lb

## 2018-07-26 DIAGNOSIS — F17200 Nicotine dependence, unspecified, uncomplicated: Secondary | ICD-10-CM | POA: Diagnosis not present

## 2018-07-26 DIAGNOSIS — J432 Centrilobular emphysema: Secondary | ICD-10-CM

## 2018-07-26 MED ORDER — GLYCOPYRROLATE-FORMOTEROL 9-4.8 MCG/ACT IN AERO: 2.0000 | INHALATION_SPRAY | Freq: Two times a day (BID) | RESPIRATORY_TRACT | 0 refills | Status: DC

## 2018-07-26 NOTE — Progress Notes (Signed)
Subjective:    Patient ID: Jodi Beasley, female    DOB: 07-05-1946, 72 y.o.   MRN: [id number]  Synopsis: Jodi Beasley established care with the [facility] pulmonary clinic in May 2013 for COPD. She simple spirometry at that time with a ratio of 71%, the flow volume loop consistent with obstruction and an FEV1 of 1.92 L or 77% predicted. She was followed previously by Dr. Efraim Beasley, a pulmonologist in the [place], [place] area. She had been treated for pneumonia 3 weeks prior to her initial visit.  She has smoked one half pack of cigarettes per day for 53 years and continues to smoke.  HPI Chief Complaint  Patient presents with  . Follow-up   Jodi Beasley was hospitalized for diarrhea and e coli bacteremia.   She has slowly recovered from that.   She says that she is still not completely recovred but things are improving. Her breathing has been "pretty good".  She hasn't had any flare ups compared to normal.  She has not been walking as much because of generalized weakness form this illness, but that is slowly improving.  She is trying to go to stores to walk inside.  She still takes [place] daily. She thinks that around 3-4 o'clock she feels more short of breath.  She wonders if taking the medicine once a day makes her feel this way in the afternoons.  She says she felt better when she was using the Symbicort twice a day.  Past Medical History:  Diagnosis Date  . Arrhythmia   . Chicken pox   . COPD (chronic obstructive pulmonary disease) ([place])   . Coronary artery disease    NSTEMI in 09/2008. Cath : 80% RCA and 95% first diagonal. PCI and 2 DES placement  RCA and 1 DES to diagonal. Complicated by acute diagonal stent thrombosis . Treated by thrombectomy. Most recent nuclear stress test in 2010 showed no ischemia with normal EF.   Marland Kitchen Depression   . GERD (gastroesophageal reflux disease)   . Hay fever   . History of blood transfusion   . History of hypercholesterolemia   . History of  syncope 2000   negative EP study  . Hyperlipidemia    intolerance to statins except Crestor.   . Hypertension   . Hypothyroid   . MI (myocardial infarction) ([place])   . Pneumonia, organism unspecified(486)     Review of Systems  Constitutional: Negative for fatigue, fever and unexpected weight change.  HENT: Negative for congestion, postnasal drip, rhinorrhea and sinus pressure.   Respiratory: Negative for cough, choking and shortness of breath.   Cardiovascular: Negative for chest pain and leg swelling.       Objective:   Physical Exam Vitals:   07/26/18 1126  BP: 134/72  Pulse: 78  SpO2: 95%  Weight: 150 lb 12.8 oz (68.4 kg)  Height: 5' 3.19" (1.605 m)   room air  Gen: well appearing HENT: OP clear, TM's clear, neck supple PULM: CTA B, normal percussion CV: RRR, no mgr, trace edema GI: BS+, soft, nontender Derm: no cyanosis or rash Psyche: normal mood and affect\  PFT: 04/09/2012 Simple Spirometry: Ratio 71%, FEV1 1.92L (77% pred); flow volume loop consistent with obstruction 03/2016 PFT> FEV1 1.73 L (74%, > 30% change with bronchodilator)  Records from her hospitalization in 04/2018 for diarrhea and e coli bacteremia [place]weed, she was treated with IV antibiotics; the discharge summary indicates she had pneumonia but on my review of her CXR she had no infiltrate  Assessment & Plan:   Centrilobular emphysema ([place])  Smoker  Discussion  Aside from her hospitalization related to the gastrointestinal illness this has been a stable interval for [facility].  She has not had an exacerbation of her COPD.  She is still smoking cigarettes, we talked today about the importance of quitting.  She thinks that taking a controller medicine once a day is not as effective for her as taking them twice a day.  I think there is value in trying a twice a day controller medicine again.  Plan: COPD: Quit smoking Try taking Bivespi twice a day, call me if you think this is helpful compared  to [facility] Flu shot next week, get the high-dose Practice good hand hygiene Stay active Continue to use albuterol as needed for chest tightness wheezing or shortness of breath  Cigarette smoking: Stop smoking  We will see you back in 6 months or sooner if needed  Updated Medication List Outpatient Encounter Medications as of 07/26/2018  Medication Sig  . albuterol (PROAIR HFA) 108 (90 Base) MCG/ACT inhaler Inhale 1-2 puffs into the lungs every 6 (six) hours as needed for wheezing or shortness of breath.  Marland Kitchen aspirin 81 MG tablet Take 81 mg by mouth daily.  Marland Kitchen atorvastatin (LIPITOR) 80 MG tablet TAKE 1 TABLET(80 MG) BY MOUTH DAILY  . BREO ELLIPTA 100-25 MCG/INH AEPB INHALE 1 PUFF BY MOUTH EVERY DAY  . buPROPion (WELLBUTRIN XL) 150 MG 24 hr tablet TAKE 1 TABLET(150 MG) BY MOUTH DAILY  . carisoprodol (SOMA) 350 MG tablet TAKE 1 TABLET BY MOUTH TWICE DAILY AS NEEDED FOR MUSCLE SPASMS  . Cholecalciferol (VITAMIN D-3) 1000 UNITS CAPS Take 1 capsule by mouth daily.  . citalopram (CELEXA) 40 MG tablet TAKE 1 TABLET(40 MG) BY MOUTH DAILY  . Coenzyme Q10 (COQ10) 400 MG CAPS Take 400 mg by mouth daily.  . fluticasone (FLONASE) 50 MCG/ACT nasal spray Place 2 sprays into both nostrils daily.  Marland Kitchen levothyroxine (SYNTHROID, LEVOTHROID) 112 MCG tablet TAKE 1 TABLET BY MOUTH EVERY DAY BEFORE BREAKFAST  . loratadine (CLARITIN) 10 MG tablet Take 10 mg by mouth daily.  . Multiple Vitamin (MULTIVITAMIN) capsule Take 1 capsule by mouth daily.  . nitroGLYCERIN (NITROSTAT) 0.4 MG SL tablet Place 1 tablet (0.4 mg total) under the tongue every 5 (five) minutes as needed.  Marland Kitchen omeprazole (PRILOSEC) 20 MG capsule TAKE 1 CAPSULE BY MOUTH EVERY DAY  . metoprolol tartrate (LOPRESSOR) 25 MG tablet Take 1 tablet (25 mg total) by mouth 2 (two) times daily. (Patient taking differently: Take 50 mg by mouth daily. )  . [DISCONTINUED] vitamin B-12 (CYANOCOBALAMIN) 100 MCG tablet Take 100 mcg by mouth daily.   No  facility-administered encounter medications on file as of 07/26/2018.

## 2018-07-26 NOTE — Patient Instructions (Signed)
COPD: Quit smoking Try taking Bivespi twice a day, call me if you think this is helpful compared to [facility] Flu shot next week, get the high-dose Practice good hand hygiene Stay active Continue to use albuterol as needed for chest tightness wheezing or shortness of breath  Cigarette smoking: Stop smoking  We will see you back in 6 months or sooner if needed

## 2018-07-26 NOTE — Addendum Note (Signed)
Addended by: Len Blalock on: 07/26/2018 11:48 AM   Modules accepted: Orders

## 2018-08-01 ENCOUNTER — Encounter: Payer: Self-pay | Admitting: Family Medicine

## 2018-08-01 ENCOUNTER — Ambulatory Visit (INDEPENDENT_AMBULATORY_CARE_PROVIDER_SITE_OTHER): Payer: Medicare Other | Admitting: Family Medicine

## 2018-08-01 VITALS — BP 122/80 | HR 65 | Temp 98.2°F | Ht 63.0 in | Wt 152.0 lb

## 2018-08-01 DIAGNOSIS — M5442 Lumbago with sciatica, left side: Secondary | ICD-10-CM | POA: Diagnosis not present

## 2018-08-01 DIAGNOSIS — R238 Other skin changes: Secondary | ICD-10-CM

## 2018-08-01 DIAGNOSIS — T148XXA Other injury of unspecified body region, initial encounter: Secondary | ICD-10-CM

## 2018-08-01 DIAGNOSIS — R002 Palpitations: Secondary | ICD-10-CM

## 2018-08-01 DIAGNOSIS — I2510 Atherosclerotic heart disease of native coronary artery without angina pectoris: Secondary | ICD-10-CM

## 2018-08-01 DIAGNOSIS — J309 Allergic rhinitis, unspecified: Secondary | ICD-10-CM | POA: Diagnosis not present

## 2018-08-01 DIAGNOSIS — J449 Chronic obstructive pulmonary disease, unspecified: Secondary | ICD-10-CM

## 2018-08-01 DIAGNOSIS — G8929 Other chronic pain: Secondary | ICD-10-CM

## 2018-08-01 DIAGNOSIS — M5441 Lumbago with sciatica, right side: Secondary | ICD-10-CM

## 2018-08-01 LAB — BASIC METABOLIC PANEL
BUN: 22 mg/dL (ref 6–23)
CALCIUM: 9.3 mg/dL (ref 8.4–10.5)
CO2: 23 mEq/L (ref 19–32)
Chloride: 104 mEq/L (ref 96–112)
Creatinine, Ser: 1.59 mg/dL — ABNORMAL HIGH (ref 0.40–1.20)
GFR: 33.99 mL/min — AB (ref >60.00)
Glucose, Bld: 94 mg/dL (ref 70–99)
POTASSIUM: 4.2 meq/L (ref 3.5–5.1)
Sodium: 136 mEq/L (ref 135–145)

## 2018-08-01 LAB — TSH: TSH: 0.08 u[IU]/mL — AB (ref 0.35–4.50)

## 2018-08-01 MED ORDER — MUPIROCIN 2 % EX OINT: 1.0000 "application " | TOPICAL_OINTMENT | Freq: Two times a day (BID) | CUTANEOUS | 0 refills | Status: DC | PRN

## 2018-08-01 NOTE — Progress Notes (Signed)
Jodi Rumps, MD Phone: [phone]  Leanor Voris is a 72 y.o. female who presents today for f/u.  CC: COPD, allergic rhinitis, CAD, back pain, palpitations  COPD: Rare cough and wheezing.  Occasional shortness of breath with it being hot outside though not with cooler temperatures.  She has seen her pulmonologist and they trialed a new inhaler though she is going to go back on her Breo.  Does have some chronic allergies.  Claritin is helpful.  She has been taking CBD for this and it seems to help.  Notes no allergy symptoms currently.  Back pain: This is chronic.  She is doing well at this time.  She sees a Restaurant manager, fast food every couple of weeks.  She will have occasional discomfort if she overdoes or twists the wrong way.  The Soma does help.  She notes no drowsiness with the soma.  No alcohol intake with the soma.  She was seen in the urgent care for palpitations.  She had a very high caffeine drink with lots of sugar prior to that and thinks that may have contributed.  EKG with PVCs.  She has follow-up with her cardiologist soon.  She is on metoprolol.  No chest pain.  She continues on Lipitor, aspirin, and metoprolol.  Patient notes several scratches from her dog on her arms.  She uses mupirocin on these.  No signs of infection.  Social History   Tobacco Use  Smoking Status Current Every Day Smoker  . Packs/day: 1.00  . Years: 53.00  . Pack years: 53.00  . Types: E-cigarettes, Cigarettes  . Start date: 11/19/1960  Smokeless Tobacco Never Used  Tobacco Comment   down to 0.75ppd     ROS see history of present illness  Objective  Physical Exam Vitals:   08/01/18 1052  BP: 122/80  Pulse: 65  Temp: 98.2 F (36.8 C)  SpO2: 94%    BP Readings from Last 3 Encounters:  08/01/18 122/80  07/26/18 134/72  05/18/18 (!) 157/87   Wt Readings from Last 3 Encounters:  08/01/18 152 lb (68.9 kg)  07/26/18 150 lb 12.8 oz (68.4 kg)  05/18/18 152 lb (68.9 kg)    Physical  Exam  Constitutional: No distress.  HENT:  Head: Normocephalic and atraumatic.  Mouth/Throat: Oropharynx is clear and moist.  Eyes: Pupils are equal, round, and reactive to light. Conjunctivae are normal.  Cardiovascular: Normal rate, regular rhythm and normal heart sounds.  Pulmonary/Chest: Effort normal and breath sounds normal.  Musculoskeletal: She exhibits no edema.  Neurological: She is alert.  Skin: Skin is warm and dry. She is not diaphoretic.  Scattered scabs on her bilateral arms with no surrounding erythema     Assessment/Plan: Please see individual problem list.  Coronary artery disease No chest pain.  She will see her cardiologist next week.  Allergic rhinitis Seems to be relatively well controlled now.  She will monitor.  Counseled on potential CBD issues.  COPD (chronic obstructive pulmonary disease) with chronic bronchitis (HCC) Seems to be adequately controlled.  She will continue to follow with pulmonology.  Continue current regimen.  Palpitations EKG from urgent care with PVCs.  Potentially could be related to that or caffeine intake.  We will check a TSH and electrolytes.  She will see her cardiologist as planned.  Multiple wounds of skin Appear to be well-healing.  Do not appear infected.  We will refill the mupirocin for her to use.  Chronic low back pain (Primary Area of Pain) (Bilateral) (L>R) Doing  well.  Continue current treatment.   Orders Placed This Encounter  Procedures  . Basic Metabolic Panel (BMET)  . TSH    Meds ordered this encounter  Medications  . mupirocin ointment (BACTROBAN) 2 %    Sig: Apply 1 application topically 2 (two) times daily as needed.    Dispense:  22 g    Refill:  0     Jodi Rumps, MD [place]

## 2018-08-01 NOTE — Patient Instructions (Signed)
Nice to see you. Please continue to see her chiropractor. Please see your cardiologist as planned. If you have recurrent palpitations please be evaluated.

## 2018-08-02 ENCOUNTER — Other Ambulatory Visit: Payer: Self-pay | Admitting: Family Medicine

## 2018-08-02 ENCOUNTER — Encounter: Payer: Self-pay | Admitting: Family Medicine

## 2018-08-02 DIAGNOSIS — R002 Palpitations: Secondary | ICD-10-CM | POA: Insufficient documentation

## 2018-08-02 DIAGNOSIS — T148XXA Other injury of unspecified body region, initial encounter: Secondary | ICD-10-CM | POA: Insufficient documentation

## 2018-08-02 DIAGNOSIS — E039 Hypothyroidism, unspecified: Secondary | ICD-10-CM

## 2018-08-02 DIAGNOSIS — R238 Other skin changes: Secondary | ICD-10-CM

## 2018-08-02 MED ORDER — LEVOTHYROXINE SODIUM 100 MCG PO TABS: 100.0000 ug | ORAL_TABLET | Freq: Every day | ORAL | 1 refills | Status: DC

## 2018-08-02 NOTE — Assessment & Plan Note (Signed)
EKG from urgent care with PVCs.  Potentially could be related to that or caffeine intake.  We will check a TSH and electrolytes.  She will see her cardiologist as planned.

## 2018-08-02 NOTE — Assessment & Plan Note (Signed)
Appear to be well-healing.  Do not appear infected.  We will refill the mupirocin for her to use.

## 2018-08-02 NOTE — Assessment & Plan Note (Signed)
Seems to be relatively well controlled now.  She will monitor.  Counseled on potential CBD issues.

## 2018-08-02 NOTE — Assessment & Plan Note (Signed)
No chest pain.  She will see her cardiologist next week.

## 2018-08-02 NOTE — Assessment & Plan Note (Signed)
Doing well. Continue current treatment

## 2018-08-02 NOTE — Assessment & Plan Note (Signed)
Seems to be adequately controlled.  She will continue to follow with pulmonology.  Continue current regimen.

## 2018-08-07 ENCOUNTER — Other Ambulatory Visit: Payer: Self-pay | Admitting: Family Medicine

## 2018-08-10 ENCOUNTER — Ambulatory Visit: Payer: Medicare Other | Admitting: Cardiovascular Disease

## 2018-08-14 DIAGNOSIS — M9903 Segmental and somatic dysfunction of lumbar region: Secondary | ICD-10-CM | POA: Diagnosis not present

## 2018-08-14 DIAGNOSIS — M9901 Segmental and somatic dysfunction of cervical region: Secondary | ICD-10-CM | POA: Diagnosis not present

## 2018-08-14 DIAGNOSIS — M5033 Other cervical disc degeneration, cervicothoracic region: Secondary | ICD-10-CM | POA: Diagnosis not present

## 2018-08-14 DIAGNOSIS — M5416 Radiculopathy, lumbar region: Secondary | ICD-10-CM | POA: Diagnosis not present

## 2018-08-16 DIAGNOSIS — N390 Urinary tract infection, site not specified: Secondary | ICD-10-CM | POA: Diagnosis not present

## 2018-08-16 DIAGNOSIS — N183 Chronic kidney disease, stage 3 (moderate): Secondary | ICD-10-CM | POA: Diagnosis not present

## 2018-08-16 DIAGNOSIS — N179 Acute kidney failure, unspecified: Secondary | ICD-10-CM | POA: Diagnosis not present

## 2018-08-16 DIAGNOSIS — I10 Essential (primary) hypertension: Secondary | ICD-10-CM | POA: Diagnosis not present

## 2018-08-16 DIAGNOSIS — E871 Hypo-osmolality and hyponatremia: Secondary | ICD-10-CM | POA: Diagnosis not present

## 2018-08-19 ENCOUNTER — Other Ambulatory Visit: Payer: Self-pay | Admitting: Family Medicine

## 2018-08-21 DIAGNOSIS — M9901 Segmental and somatic dysfunction of cervical region: Secondary | ICD-10-CM | POA: Diagnosis not present

## 2018-08-21 DIAGNOSIS — M5033 Other cervical disc degeneration, cervicothoracic region: Secondary | ICD-10-CM | POA: Diagnosis not present

## 2018-08-21 DIAGNOSIS — M9903 Segmental and somatic dysfunction of lumbar region: Secondary | ICD-10-CM | POA: Diagnosis not present

## 2018-08-21 DIAGNOSIS — M5416 Radiculopathy, lumbar region: Secondary | ICD-10-CM | POA: Diagnosis not present

## 2018-08-22 ENCOUNTER — Ambulatory Visit (INDEPENDENT_AMBULATORY_CARE_PROVIDER_SITE_OTHER): Payer: Medicare Other | Admitting: *Deleted

## 2018-08-22 ENCOUNTER — Other Ambulatory Visit: Payer: Self-pay | Admitting: Family Medicine

## 2018-08-22 DIAGNOSIS — Z23 Encounter for immunization: Secondary | ICD-10-CM | POA: Diagnosis not present

## 2018-08-22 NOTE — Telephone Encounter (Signed)
Last OV 04/27/2018   Last refilled 07/13/2018 disp 60 with no refills   Sent to PCP for approval

## 2018-08-23 NOTE — Telephone Encounter (Signed)
Sent to pharmacy.  Controlled substance database reviewed. 

## 2018-09-02 ENCOUNTER — Other Ambulatory Visit: Payer: Self-pay | Admitting: Pulmonary Disease

## 2018-09-11 DIAGNOSIS — M9903 Segmental and somatic dysfunction of lumbar region: Secondary | ICD-10-CM | POA: Diagnosis not present

## 2018-09-11 DIAGNOSIS — M5033 Other cervical disc degeneration, cervicothoracic region: Secondary | ICD-10-CM | POA: Diagnosis not present

## 2018-09-11 DIAGNOSIS — M5416 Radiculopathy, lumbar region: Secondary | ICD-10-CM | POA: Diagnosis not present

## 2018-09-11 DIAGNOSIS — M9901 Segmental and somatic dysfunction of cervical region: Secondary | ICD-10-CM | POA: Diagnosis not present

## 2018-09-28 ENCOUNTER — Ambulatory Visit: Payer: Self-pay | Admitting: Family Medicine

## 2018-10-01 ENCOUNTER — Other Ambulatory Visit: Payer: Self-pay | Admitting: Family Medicine

## 2018-10-08 ENCOUNTER — Other Ambulatory Visit: Payer: Self-pay | Admitting: Family Medicine

## 2018-10-09 DIAGNOSIS — M9903 Segmental and somatic dysfunction of lumbar region: Secondary | ICD-10-CM | POA: Diagnosis not present

## 2018-10-09 DIAGNOSIS — M5033 Other cervical disc degeneration, cervicothoracic region: Secondary | ICD-10-CM | POA: Diagnosis not present

## 2018-10-09 DIAGNOSIS — M9901 Segmental and somatic dysfunction of cervical region: Secondary | ICD-10-CM | POA: Diagnosis not present

## 2018-10-09 DIAGNOSIS — M5416 Radiculopathy, lumbar region: Secondary | ICD-10-CM | POA: Diagnosis not present

## 2018-10-15 ENCOUNTER — Ambulatory Visit (INDEPENDENT_AMBULATORY_CARE_PROVIDER_SITE_OTHER): Payer: Medicare Other | Admitting: Cardiovascular Disease

## 2018-10-15 ENCOUNTER — Other Ambulatory Visit: Payer: Self-pay | Admitting: Family Medicine

## 2018-10-15 ENCOUNTER — Encounter

## 2018-10-15 ENCOUNTER — Encounter: Payer: Self-pay | Admitting: Cardiovascular Disease

## 2018-10-15 VITALS — BP 116/74 | HR 58 | Ht 65.0 in | Wt 153.6 lb

## 2018-10-15 DIAGNOSIS — I2510 Atherosclerotic heart disease of native coronary artery without angina pectoris: Secondary | ICD-10-CM | POA: Diagnosis not present

## 2018-10-15 DIAGNOSIS — I10 Essential (primary) hypertension: Secondary | ICD-10-CM | POA: Diagnosis not present

## 2018-10-15 DIAGNOSIS — E782 Mixed hyperlipidemia: Secondary | ICD-10-CM | POA: Diagnosis not present

## 2018-10-15 DIAGNOSIS — F17200 Nicotine dependence, unspecified, uncomplicated: Secondary | ICD-10-CM | POA: Diagnosis not present

## 2018-10-15 DIAGNOSIS — J449 Chronic obstructive pulmonary disease, unspecified: Secondary | ICD-10-CM | POA: Diagnosis not present

## 2018-10-15 DIAGNOSIS — R002 Palpitations: Secondary | ICD-10-CM | POA: Diagnosis not present

## 2018-10-15 NOTE — Patient Instructions (Signed)
Medication Instructions:  Dr Croitoru recommends that you continue on your current medications as directed. Please refer to the Current Medication list given to you today.  If you need a refill on your cardiac medications before your next appointment, please call your pharmacy.   Follow-Up: At [facility], you and your health needs are our priority.  As part of our continuing mission to provide you with exceptional heart care, we have created designated Provider Care Teams.  These Care Teams include your primary Cardiologist (physician) and Advanced Practice Providers (APPs -  Physician Assistants and Nurse Practitioners) who all work together to provide you with the care you need, when you need it. You will need a follow up appointment in 12 months.  Please call our office 2 months in advance to schedule this appointment.  You may see Mihai Croitoru, MD or one of the following Advanced Practice Providers on your designated Care Team: Hao Meng, PA-C . Oni Dietzman Duke, PA-C 

## 2018-10-15 NOTE — Progress Notes (Signed)
Cardiology Office Note    Date:  10/16/2018   ID:  Jodi Beasley, DOB 05/08/1946, MRN [id number]  PCP:  Leone Haven, MD  Cardiologist:   Sanda Klein, MD   Chief Complaint  Patient presents with  . Follow-up    CAD    History of Present Illness:  Jodi Beasley is a 72 y.o. female with coronary disease, mild sinus node dysfunction, hypertension and hyperlipidemia returning for follow-up.  She has not had any new problems, but was hospitalized for E. coli bacteremia/sepsis for a week in May.  She had an acute diarrheal illness followed by severe hyponatremia septic shock, pneumonia and acute kidney injury.  She has been following up with Dr.Sarath Kolluru in the nephrology clinic in [place].  And her renal function parameters have been steadily improving, although they are still not completely back to her previous baseline.  Unfortunately she continues to smoke.  Her current bronchodilators offer fairly good relief of dyspnea.  She has occasional palpitations, but has not paid attention to see if they correlate with bronchodilator use.  There are not associated with dizziness, angina, syncope or worsening shortness of breath.  She has taken nitroglycerin may be twice a year in the last few years.  She does not have exertional chest pain.  She denies claudication or leg edema.  She has not had any focal neurological complaints.  Roughly 4 years have passed since [name], her longtime partner and also on of our patients, passed away in their home.  She has coronary artery disease. In October 2009 she had a small NSTEMI and received 2 drug-eluting stents (RCA and diagonal artery). Immediately following this she had a small lateral STEMI due to acute diagonal stent thrombosis, treated with thrombectomy and balloon angioplasty. She had a normal nuclear stress test in 2014 and has normal left ventricular systolic function.  Past Medical History:  Diagnosis Date  . Arrhythmia     . Chicken pox   . COPD (chronic obstructive pulmonary disease) ([place])   . Coronary artery disease    NSTEMI in 09/2008. Cath : 80% RCA and 95% first diagonal. PCI and 2 DES placement  RCA and 1 DES to diagonal. Complicated by acute diagonal stent thrombosis . Treated by thrombectomy. Most recent nuclear stress test in 2010 showed no ischemia with normal EF.   Marland Kitchen Depression   . GERD (gastroesophageal reflux disease)   . Hay fever   . History of blood transfusion   . History of hypercholesterolemia   . History of syncope 2000   negative EP study  . Hyperlipidemia    intolerance to statins except Crestor.   . Hypertension   . Hypothyroid   . MI (myocardial infarction) ([place])   . Pneumonia, organism unspecified(486)   . Sepsis ([place]) 04/27/2018    Past Surgical History:  Procedure Laterality Date  . CORONARY ANGIOPLASTY WITH STENT PLACEMENT  2009   [facility], drug-eluting mid RCA,prox diag;  . TONSILLECTOMY    . TUBAL LIGATION      Current Medications: Outpatient Medications Prior to Visit  Medication Sig Dispense Refill  . albuterol (PROAIR HFA) 108 (90 Base) MCG/ACT inhaler Inhale 1-2 puffs into the lungs every 6 (six) hours as needed for wheezing or shortness of breath. 1 Inhaler 5  . aspirin 81 MG tablet Take 81 mg by mouth daily.    Marland Kitchen BREO ELLIPTA 100-25 MCG/INH AEPB INHALE 1 PUFF BY MOUTH EVERY DAY 60 each 5  . buPROPion (WELLBUTRIN XL)  150 MG 24 hr tablet TAKE 1 TABLET(150 MG) BY MOUTH DAILY 90 tablet 0  . carisoprodol (SOMA) 350 MG tablet TAKE 1 TABLET BY MOUTH TWICE DAILY AS NEEDED FOR MUSCLE SPASMS 60 tablet 0  . Cholecalciferol (VITAMIN D-3) 1000 UNITS CAPS Take 1 capsule by mouth daily.    . citalopram (CELEXA) 40 MG tablet TAKE 1 TABLET(40 MG) BY MOUTH DAILY 90 tablet 0  . Coenzyme Q10 (COQ10) 400 MG CAPS Take 400 mg by mouth daily.    . fluticasone (FLONASE) 50 MCG/ACT nasal spray Place 2 sprays into both nostrils daily. 16 g 6  . Glycopyrrolate-Formoterol (BEVESPI  AEROSPHERE) 9-4.8 MCG/ACT AERO Inhale 2 puffs into the lungs 2 (two) times daily. 1 Inhaler 0  . levothyroxine (SYNTHROID, LEVOTHROID) 100 MCG tablet Take 1 tablet (100 mcg total) by mouth daily before breakfast. 90 tablet 1  . loratadine (CLARITIN) 10 MG tablet Take 10 mg by mouth daily.    . Multiple Vitamin (MULTIVITAMIN) capsule Take 1 capsule by mouth daily.    . mupirocin ointment (BACTROBAN) 2 % Apply 1 application topically 2 (two) times daily as needed. 22 g 0  . nitroGLYCERIN (NITROSTAT) 0.4 MG SL tablet Place 1 tablet (0.4 mg total) under the tongue every 5 (five) minutes as needed. 25 tablet 3  . omeprazole (PRILOSEC) 20 MG capsule TAKE 1 CAPSULE BY MOUTH EVERY DAY 90 capsule 0  . atorvastatin (LIPITOR) 80 MG tablet TAKE 1 TABLET(80 MG) BY MOUTH DAILY 90 tablet 1  . metoprolol tartrate (LOPRESSOR) 25 MG tablet Take 1 tablet (25 mg total) by mouth 2 (two) times daily. (Patient taking differently: Take 50 mg by mouth daily. ) 60 tablet 0   No facility-administered medications prior to visit.      Allergies:   Benzocaine; Darvon [propoxyphene hcl]; Monosodium glutamate; Neosporin [neomycin-bacitracin zn-polymyx]; Nicoderm [nicotine]; and Prednisone   Social History   Socioeconomic History  . Marital status: Widowed    Spouse name: Not on file  . Number of children: Not on file  . Years of education: Not on file  . Highest education level: Not on file  Occupational History  . Not on file  Social Needs  . Financial resource strain: Not hard at all  . Food insecurity:    Worry: Never true    Inability: Never true  . Transportation needs:    Medical: No    Non-medical: No  Tobacco Use  . Smoking status: Current Every Day Smoker    Packs/day: 1.00    Years: 53.00    Pack years: 53.00    Types: E-cigarettes, Cigarettes    Start date: 11/19/1960  . Smokeless tobacco: Never Used  . Tobacco comment: down to 0.75ppd  Substance and Sexual Activity  . Alcohol use: Yes     Alcohol/week: 1.0 standard drinks    Types: 1 Standard drinks or equivalent per week  . Drug use: No  . Sexual activity: Yes  Lifestyle  . Physical activity:    Days per week: Not on file    Minutes per session: Not on file  . Stress: Not at all  Relationships  . Social connections:    Talks on phone: Not on file    Gets together: Not on file    Attends religious service: Not on file    Active member of club or organization: Not on file    Attends meetings of clubs or organizations: Not on file    Relationship status: Not on file  Other Topics Concern  . Not on file  Social History Narrative   Lives with mother in [place]. 2 cats 2 dogs in home.   Recently moved from [facility].   Custody of 44month old.     Family History:  The patient's family history includes Cancer in her father; Colon cancer in her father; Diabetes in her father and unknown relative; Hypertension in her mother and unknown relative; Lung cancer in her father.   ROS:   Please see the history of present illness.    ROS All other systems reviewed and are negative.   PHYSICAL EXAM:   VS:  BP 116/74   Pulse (!) 58   Ht 5\' 5"  (1.651 m)   Wt 153 lb 9.6 oz (69.7 kg)   BMI 25.56 kg/m     General: Alert, oriented x3, no distress, appears lean Head: no evidence of trauma, PERRL, EOMI, no exophtalmos or lid lag, no myxedema, no xanthelasma; normal ears, nose and oropharynx Neck: normal jugular venous pulsations and no hepatojugular reflux; brisk carotid pulses without delay and no carotid bruits Chest: clear to auscultation, no signs of consolidation by percussion or palpation, normal fremitus, symmetrical and full respiratory excursions Cardiovascular: normal position and quality of the apical impulse, regular rhythm, normal first and second heart sounds, no murmurs, rubs or gallops Abdomen: no tenderness or distention, no masses by palpation, no abnormal pulsatility or arterial bruits, normal bowel sounds, no  hepatosplenomegaly Extremities: no clubbing, cyanosis or edema; 2+ radial, ulnar and brachial pulses bilaterally; 2+ right femoral, posterior tibial and dorsalis pedis pulses; 2+ left femoral, posterior tibial and dorsalis pedis pulses; no subclavian or femoral bruits Neurological: grossly nonfocal Psych: Normal mood and affect   Wt Readings from Last 3 Encounters:  10/15/18 153 lb 9.6 oz (69.7 kg)  08/01/18 152 lb (68.9 kg)  07/26/18 150 lb 12.8 oz (68.4 kg)      Studies/Labs Reviewed:   EKG:  EKG is ordered today.  The ekg ordered today demonstrates mild sinus bradycardia otherwise normal tracing, QTC 451 ms Recent Labs: 12/12/2017: Magnesium 2.1 04/16/2018: ALT 28 04/21/2018: Hemoglobin 10.5; Platelets 258 08/01/2018: BUN 22; Creatinine, Ser 1.59; Potassium 4.2; Sodium 136; TSH 0.08   Lipid Panel    Component Value Date/Time   CHOL 174 03/28/2018 0838   TRIG 198.0 (H) 03/28/2018 0838   HDL 50.20 03/28/2018 0838   CHOLHDL 3 03/28/2018 0838   VLDL 39.6 03/28/2018 0838   LDLCALC 84 03/28/2018 0838   LDLDIRECT 92.0 04/10/2017 1047    Additional studies/ records that were reviewed today include:  Notes from Dr. Caryl Bis   ASSESSMENT:    1. Coronary artery disease involving native coronary artery of native heart without angina pectoris   2. Essential hypertension   3. Mixed hyperlipidemia   4. Palpitations   5. Chronic obstructive pulmonary disease, unspecified COPD type ([place])   6. Smoking      PLAN:  In order of problems listed above:  1. CAD: Asymptomatic at rest and with activity. History of stents in the right coronary artery and diagonal artery in 2009. Normal nuclear stress test in 2014.  Smoking cessation remains the biggest target for improved long-term outcome.  We could also do a little better with lipid lowering, I think. 2. HTN: Excellent control 3. HLP: Mild hypertriglyceridemia does not require specific therapy, but to target LDL should be less than 70.   Her LDL has been steadily improving this year and even her triglycerides were little  better when last checked.  She is on maximum dose atorvastatin and is reluctant to take any more medications. 4. Palpitations: Discussed the fact that these may be precipitated by use of beta agonist bronchodilators.  She knows that she should not use her long-acting bronchodilators more than prescribed.  Previous tracings suggested possible sinoatrial block, versus blocked junctional beats. She is mildly bradycardic but attempts to wean down her beta blocker led to worsening symptoms.  TSH was markedly suppressed when checked in August.  Her levothyroxine dose might need adjustment.  No changes made to medications. 5. COPD: FEV1 in April 2017 was 1.3 L/56% of predicted. Make sure she understands the distinction between the long-acting beta agonist (which should be used schedule twice daily) and her rescue inhaler (albuterol).  6. Smoking: She is convinced that she will never be able to quit smoking.  She does not want to commit to any smoking cessation attempts.    Medication Adjustments/Labs and Tests Ordered: Current medicines are reviewed at length with the patient today.  Concerns regarding medicines are outlined above.  Medication changes, Labs and Tests ordered today are listed in the Patient Instructions below. Patient Instructions  Medication Instructions:  Dr Sallyanne Kuster recommends that you continue on your current medications as directed. Please refer to the Current Medication list given to you today.  If you need a refill on your cardiac medications before your next appointment, please call your pharmacy.   Follow-Up: At [facility], you and your health needs are our priority.  As part of our continuing mission to provide you with exceptional heart care, we have created designated Provider Care Teams.  These Care Teams include your primary Cardiologist (physician) and Advanced Practice Providers (APPs -   Physician Assistants and Nurse Practitioners) who all work together to provide you with the care you need, when you need it. You will need a follow up appointment in 12 months.  Please call our office 2 months in advance to schedule this appointment.  You may see Sanda Klein, MD or one of the following Advanced Practice Providers on your designated Care Team: [place], [place] . Fabian Sharp, PA-C    Signed, Sanda Klein, MD  10/16/2018 2:33 PM    [address], [place], [place]  [address] Phone: [phone]; Fax: [phone]

## 2018-10-16 ENCOUNTER — Encounter: Payer: Self-pay | Admitting: Cardiovascular Disease

## 2018-10-16 NOTE — Addendum Note (Signed)
Addended by: Therisa Doyne on: 10/16/2018 03:40 PM   Modules accepted: Orders

## 2018-10-17 DIAGNOSIS — M9901 Segmental and somatic dysfunction of cervical region: Secondary | ICD-10-CM | POA: Diagnosis not present

## 2018-10-17 DIAGNOSIS — M5416 Radiculopathy, lumbar region: Secondary | ICD-10-CM | POA: Diagnosis not present

## 2018-10-17 DIAGNOSIS — M5033 Other cervical disc degeneration, cervicothoracic region: Secondary | ICD-10-CM | POA: Diagnosis not present

## 2018-10-17 DIAGNOSIS — M9903 Segmental and somatic dysfunction of lumbar region: Secondary | ICD-10-CM | POA: Diagnosis not present

## 2018-10-17 NOTE — Telephone Encounter (Signed)
Controlled substance database reviewed. Sent to pharmacy.   

## 2018-11-14 ENCOUNTER — Other Ambulatory Visit: Payer: Self-pay | Admitting: Family Medicine

## 2018-11-15 DIAGNOSIS — N179 Acute kidney failure, unspecified: Secondary | ICD-10-CM | POA: Diagnosis not present

## 2018-11-15 DIAGNOSIS — N390 Urinary tract infection, site not specified: Secondary | ICD-10-CM | POA: Diagnosis not present

## 2018-11-15 DIAGNOSIS — I10 Essential (primary) hypertension: Secondary | ICD-10-CM | POA: Diagnosis not present

## 2018-11-15 DIAGNOSIS — E871 Hypo-osmolality and hyponatremia: Secondary | ICD-10-CM | POA: Diagnosis not present

## 2018-11-15 DIAGNOSIS — E872 Acidosis: Secondary | ICD-10-CM | POA: Diagnosis not present

## 2018-11-19 DIAGNOSIS — E871 Hypo-osmolality and hyponatremia: Secondary | ICD-10-CM | POA: Diagnosis not present

## 2018-11-19 DIAGNOSIS — I129 Hypertensive chronic kidney disease with stage 1 through stage 4 chronic kidney disease, or unspecified chronic kidney disease: Secondary | ICD-10-CM | POA: Diagnosis not present

## 2018-11-19 DIAGNOSIS — N183 Chronic kidney disease, stage 3 (moderate): Secondary | ICD-10-CM | POA: Diagnosis not present

## 2018-11-19 DIAGNOSIS — N179 Acute kidney failure, unspecified: Secondary | ICD-10-CM | POA: Diagnosis not present

## 2018-11-20 DIAGNOSIS — M5033 Other cervical disc degeneration, cervicothoracic region: Secondary | ICD-10-CM | POA: Diagnosis not present

## 2018-11-20 DIAGNOSIS — M9901 Segmental and somatic dysfunction of cervical region: Secondary | ICD-10-CM | POA: Diagnosis not present

## 2018-11-20 DIAGNOSIS — M9903 Segmental and somatic dysfunction of lumbar region: Secondary | ICD-10-CM | POA: Diagnosis not present

## 2018-11-20 DIAGNOSIS — M5416 Radiculopathy, lumbar region: Secondary | ICD-10-CM | POA: Diagnosis not present

## 2018-11-21 ENCOUNTER — Other Ambulatory Visit: Payer: Self-pay | Admitting: Family Medicine

## 2018-11-21 ENCOUNTER — Ambulatory Visit (INDEPENDENT_AMBULATORY_CARE_PROVIDER_SITE_OTHER): Payer: Medicare Other | Admitting: Family Medicine

## 2018-11-21 ENCOUNTER — Encounter: Payer: Self-pay | Admitting: Family Medicine

## 2018-11-21 VITALS — BP 126/68 | HR 62 | Temp 97.8°F | Ht 65.0 in | Wt 157.0 lb

## 2018-11-21 DIAGNOSIS — M5441 Lumbago with sciatica, right side: Secondary | ICD-10-CM | POA: Diagnosis not present

## 2018-11-21 DIAGNOSIS — I2510 Atherosclerotic heart disease of native coronary artery without angina pectoris: Secondary | ICD-10-CM | POA: Diagnosis not present

## 2018-11-21 DIAGNOSIS — K219 Gastro-esophageal reflux disease without esophagitis: Secondary | ICD-10-CM | POA: Diagnosis not present

## 2018-11-21 DIAGNOSIS — G8929 Other chronic pain: Secondary | ICD-10-CM | POA: Diagnosis not present

## 2018-11-21 DIAGNOSIS — F4323 Adjustment disorder with mixed anxiety and depressed mood: Secondary | ICD-10-CM | POA: Diagnosis not present

## 2018-11-21 DIAGNOSIS — M79643 Pain in unspecified hand: Secondary | ICD-10-CM

## 2018-11-21 DIAGNOSIS — E7800 Pure hypercholesterolemia, unspecified: Secondary | ICD-10-CM | POA: Diagnosis not present

## 2018-11-21 DIAGNOSIS — M5442 Lumbago with sciatica, left side: Secondary | ICD-10-CM | POA: Diagnosis not present

## 2018-11-21 DIAGNOSIS — J449 Chronic obstructive pulmonary disease, unspecified: Secondary | ICD-10-CM | POA: Diagnosis not present

## 2018-11-21 DIAGNOSIS — E039 Hypothyroidism, unspecified: Secondary | ICD-10-CM

## 2018-11-21 DIAGNOSIS — N183 Chronic kidney disease, stage 3 unspecified: Secondary | ICD-10-CM

## 2018-11-21 NOTE — Patient Instructions (Signed)
Nice to see you. We will check lab work today and contact you with the results. Please monitor the joint aches in her hands. I would recommend against using CBD as it is not regulated or controlled and it is difficult to tell how it will interact with your other medications and with your medical issues.

## 2018-11-21 NOTE — Assessment & Plan Note (Signed)
Check TSH.  Continue Synthroid.  Prior TSH was low.

## 2018-11-21 NOTE — Assessment & Plan Note (Signed)
Well-controlled on omeprazole.  She will continue this as she has symptoms when coming off of it.

## 2018-11-21 NOTE — Progress Notes (Signed)
Jodi Rumps, MD Phone: [phone]  Jodi Beasley is a 72 y.o. female who presents today for f/u.  CC: Depression, hypothyroidism, COPD, chronic low back pain, joint aches in her hands, GERD  Depression: She notes no depression though does note sadness related to the loss of her parents and her significant other over the years.  She is on Wellbutrin and Celexa.  No SI.  Hypothyroidism: Taking Synthroid.  The dose was decreased previously.  It does not appear that she followed up for lab work.  No palpitations, skin changes, heat or cold intolerance.  Chronic low back pain: Patient notes her back is in good shape at this time.  She sees a Restaurant manager, fast food every few weeks.  She does take the [place] daily.  It does not make her drowsy.  She does not drink alcohol near the time that she takes the [place].  Hand pain: She notes joint aches in her hands for some time now.  She noted a week or so ago she had trouble bending them related to the achiness and stiffness.  She took some prednisone which did help some.  She restarted her CBD cold brew coffee and that has helped significantly.  CKD: She follows with nephrology.  She had recent recheck of her kidney function and they plan to follow-up in 6 months.  GERD: Taking omeprazole.  No reflux, abdominal pain, blood in her stool, or dysphagia.  She notes if she discontinues the PPI she will have reflux symptoms.  COPD: Patient is no longer on her Breo.  She notes it became difficult to afford given the price increase to $100.  She notes no significant cough or wheezing.  No dyspnea.  Social History   Tobacco Use  Smoking Status Current Every Day Smoker  . Packs/day: 1.00  . Years: 53.00  . Pack years: 53.00  . Types: E-cigarettes, Cigarettes  . Start date: 11/19/1960  Smokeless Tobacco Never Used  Tobacco Comment   down to 0.75ppd     ROS see history of present illness  Objective  Physical Exam Vitals:   11/21/18 1442  BP: 126/68    Pulse: 62  Temp: 97.8 F (36.6 C)  SpO2: 97%    BP Readings from Last 3 Encounters:  11/21/18 126/68  10/15/18 116/74  08/01/18 122/80   Wt Readings from Last 3 Encounters:  11/21/18 157 lb (71.2 kg)  10/15/18 153 lb 9.6 oz (69.7 kg)  08/01/18 152 lb (68.9 kg)    Physical Exam Constitutional:      General: She is not in acute distress.    Appearance: She is not diaphoretic.  Cardiovascular:     Rate and Rhythm: Normal rate and regular rhythm.     Heart sounds: Normal heart sounds.  Pulmonary:     Effort: Pulmonary effort is normal.     Breath sounds: Normal breath sounds.  Musculoskeletal:     Comments: Bilateral hands with no joint swelling, tenderness, or erythema  Skin:    General: Skin is warm and dry.  Neurological:     Mental Status: She is alert.      Assessment/Plan: Please see individual problem list.  COPD (chronic obstructive pulmonary disease) with chronic bronchitis (HCC) Seems to be adequately controlled.  No longer on her Breo.  We will send a message to our clinical pharmacist to see if they can check on potential assistance with this or an alternative at a better price.  Hypothyroidism Check TSH.  Continue Synthroid.  Prior TSH  was low.  Chronic low back pain (Primary Area of Pain) (Bilateral) (L>R) Currently is doing well with this.  She will continue to monitor.  CKD (chronic kidney disease), stage III ([place]) She will continue to follow with nephrology.  Adjustment disorder with mixed anxiety and depressed mood Stable.  Continue current medication.  GERD (gastroesophageal reflux disease) Well-controlled on omeprazole.  She will continue this as she has symptoms when coming off of it.  Hand pain I suspect osteoarthritis.  Discussed that CBD is an unregulated substance and that I advised against using it given that we do not know how it will interact with her medications or her other medical issues. Discussed tylenol use.    Orders Placed  This Encounter  Procedures  . TSH  . Direct LDL  . Comp Met (CMET)    No orders of the defined types were placed in this encounter.    Jodi Rumps, MD [facility]

## 2018-11-21 NOTE — Assessment & Plan Note (Signed)
Currently is doing well with this.  She will continue to monitor.

## 2018-11-21 NOTE — Assessment & Plan Note (Signed)
Seems to be adequately controlled.  No longer on her Breo.  We will send a message to our clinical pharmacist to see if they can check on potential assistance with this or an alternative at a better price.

## 2018-11-21 NOTE — Assessment & Plan Note (Signed)
Stable.  Continue current medication

## 2018-11-21 NOTE — Assessment & Plan Note (Signed)
She will continue to follow with nephrology. 

## 2018-11-21 NOTE — Assessment & Plan Note (Addendum)
I suspect osteoarthritis.  Discussed that CBD is an unregulated substance and that I advised against using it given that we do not know how it will interact with her medications or her other medical issues. Discussed tylenol use.

## 2018-11-22 ENCOUNTER — Encounter: Payer: Self-pay | Admitting: Pharmacist

## 2018-11-22 ENCOUNTER — Telehealth: Payer: Self-pay | Admitting: Family Medicine

## 2018-11-22 LAB — COMPREHENSIVE METABOLIC PANEL
ALT: 20 U/L (ref 0–35)
AST: 22 U/L (ref 0–37)
Albumin: 4.2 g/dL (ref 3.5–5.2)
Alkaline Phosphatase: 77 U/L (ref 39–117)
BUN: 20 mg/dL (ref 6–23)
CO2: 25 meq/L (ref 19–32)
CREATININE: 1.63 mg/dL — AB (ref 0.40–1.20)
Calcium: 9.2 mg/dL (ref 8.4–10.5)
Chloride: 103 mEq/L (ref 96–112)
GFR: 33 mL/min — ABNORMAL LOW (ref >60.00)
GLUCOSE: 96 mg/dL (ref 70–99)
Potassium: 4.3 mEq/L (ref 3.5–5.1)
Sodium: 137 mEq/L (ref 135–145)
Total Bilirubin: 0.3 mg/dL (ref 0.2–1.2)
Total Protein: 6.9 g/dL (ref 6.0–8.3)

## 2018-11-22 LAB — TSH: TSH: 2.14 u[IU]/mL (ref 0.35–4.50)

## 2018-11-22 LAB — LDL CHOLESTEROL, DIRECT: LDL DIRECT: 87 mg/dL

## 2018-11-22 NOTE — Telephone Encounter (Signed)
-----   Message from Leone Haven, MD sent at 11/22/2018  9:08 AM EST ----- Hey Catie,   This is not a rush and can wait until you are back in the office, though I wanted to see if you could look at this patients insurance and find out what the preferred medications are for COPD. She has been on Breo, though the price increase to $100 recently and it has become hard for her to afford. Thanks.   Randall Hiss

## 2018-11-22 NOTE — Telephone Encounter (Signed)
-----  Message from Katina J Boyd, RPH sent at 11/22/2018 10:06 AM EST ----- Regarding: Drug Prices Hello Dr. Sonnenberg.  My name is Katina Boyd and I am a Clinical Pharmacist with [facility].  I am covering Catie's inbox while she is on PAL .  If the patient keeps her current insurance, the following medications should have a $27 copay AFTER she meets her $435 deductible.  (Meaning in January, until she meets her deductible, most medications COPD or otherwise (unless they are generic) will carry a hefty copay until she spends $435):  Anoro  Breo Spiriva Symbicort Ventolin HFA (All covered and will have $27 copay AFTER her deductible is met)  Since we are at the end of the year, she is most likely in the donut hole or "coverage gap".   For 2020 when a patient's total drug cost reaches $4,020 they will be in the coverage gap and then responsible for 25% of the cost of a medication.  Breo has a retail cost of $362, once in the coverage gap, her estimated copay will be  $91.00 (for a 30 day supply).  Hope this is helpful.  Please let me know if you need something more specific. Catie or someone on our team may be able to assess the patient for eligibility for patient financial assistance programs.  Blessings,  Katina J. Boyd, PharmD, BCACP [facility] Clinical Pharmacist [phone]   ----- Message ----- From: Sonnenberg, Eric G, MD Sent: 11/22/2018   9:08 AM EST To: Catherine E Travis, RPH  Hey Catie,   This is not a rush and can wait until you are back in the office, though I wanted to see if you could look at this patients insurance and find out what the preferred medications are for COPD. She has been on Breo, though the price increase to $100 recently and it has become hard for her to afford. Thanks.   Eric   

## 2018-11-22 NOTE — Telephone Encounter (Signed)
Please let the patient know that the clinical pharmacist reviewed her insurance and noted that the patient may be in the donut hole and that might be why her [facility] cost went up.  This will likely revert back to a cheaper co-pay in the new year.  If it does not revert we could try to get her patient assistance if she meets criteria.

## 2018-11-22 NOTE — Telephone Encounter (Signed)
Controlled substance database reviewed. Sent to pharmacy.   

## 2018-11-23 ENCOUNTER — Other Ambulatory Visit: Payer: Self-pay | Admitting: Family Medicine

## 2018-11-23 DIAGNOSIS — E785 Hyperlipidemia, unspecified: Secondary | ICD-10-CM

## 2018-11-23 MED ORDER — EZETIMIBE 10 MG PO TABS: 10.0000 mg | ORAL_TABLET | Freq: Every day | ORAL | 3 refills | Status: DC

## 2018-11-23 NOTE — Telephone Encounter (Signed)
Called and spoke with patient. Pt advised and voiced understanding.  

## 2018-12-27 ENCOUNTER — Other Ambulatory Visit (INDEPENDENT_AMBULATORY_CARE_PROVIDER_SITE_OTHER): Payer: Medicare Other

## 2018-12-27 DIAGNOSIS — R7309 Other abnormal glucose: Secondary | ICD-10-CM

## 2018-12-27 DIAGNOSIS — E785 Hyperlipidemia, unspecified: Secondary | ICD-10-CM | POA: Diagnosis not present

## 2018-12-27 LAB — HEMOGLOBIN A1C: Hgb A1c MFr Bld: 6.2 % (ref 4.6–6.5)

## 2018-12-27 LAB — LDL CHOLESTEROL, DIRECT: LDL DIRECT: 72 mg/dL

## 2018-12-29 ENCOUNTER — Ambulatory Visit
Admission: EM | Admit: 2018-12-29 | Discharge: 2018-12-29 | Disposition: A | Discharge status: Home / Self Care | Payer: Medicare Other | Attending: Emergency Medicine | Admitting: Emergency Medicine

## 2018-12-29 ENCOUNTER — Ambulatory Visit: Payer: Medicare Other

## 2018-12-29 ENCOUNTER — Other Ambulatory Visit: Payer: Self-pay

## 2018-12-29 DIAGNOSIS — M19071 Primary osteoarthritis, right ankle and foot: Secondary | ICD-10-CM | POA: Diagnosis not present

## 2018-12-29 DIAGNOSIS — M79671 Pain in right foot: Secondary | ICD-10-CM

## 2018-12-29 NOTE — Discharge Instructions (Addendum)
Try thousand milligrams 3 times a day, gentle stretching, heel cups.  Ice for 20 minutes at a time.  Follow-up with Dr. Elvina Mattes if you are still having pain in within a week.

## 2018-12-29 NOTE — ED Triage Notes (Signed)
Pt reports her right foot started hurting on Thursday, but unsure what she did to it. Bottom of foot hurts all the time.

## 2018-12-29 NOTE — ED Provider Notes (Signed)
HPI  SUBJECTIVE:  Jodi Beasley is a 73 y.o. female who presents with 3 days of constant right foot plantar pain described as soreness, achiness located along the arch and heel.  She reports some swelling along the plantar aspect of her foot.  No trauma to the foot, change in physical activity.  No change in footwear.  No erythema, bruising, fevers, body aches, numbness or tingling in her toes, color changes.  She has no ankle pain.  She has had symptoms like this before, many years ago.  She tried 20 mg of prednisone for 2 days, 650 mg of Tylenol twice daily without improvement in her symptoms.  Symptoms are worse with standing, walking.  She has a past medical history of right foot fracture, right heel spur, foot plantar fasciitis, MI status post stent, hypercholesterolemia, chronic kidney disease.  She was admitted to the hospital for E. coli sepsis last year.  She has a history of irregular heart rate which is controlled with metoprolol.  No history of diabetes, hypertension.  KYH:[id number], Angela Adam, MD   Past Medical History:  Diagnosis Date  . Arrhythmia   . Chicken pox   . COPD (chronic obstructive pulmonary disease) ([place])   . Coronary artery disease    NSTEMI in 09/2008. Cath : 80% RCA and 95% first diagonal. PCI and 2 DES placement  RCA and 1 DES to diagonal. Complicated by acute diagonal stent thrombosis . Treated by thrombectomy. Most recent nuclear stress test in 2010 showed no ischemia with normal EF.   Marland Kitchen Depression   . GERD (gastroesophageal reflux disease)   . Hay fever   . History of blood transfusion   . History of hypercholesterolemia   . History of syncope 2000   negative EP study  . Hyperlipidemia    intolerance to statins except Crestor.   . Hypertension   . Hypothyroid   . MI (myocardial infarction) ([place])   . Pneumonia, organism unspecified(486)   . Sepsis ([place]) 04/27/2018    Past Surgical History:  Procedure Laterality Date  . CORONARY ANGIOPLASTY WITH STENT  PLACEMENT  2009   [facility], drug-eluting mid RCA,prox diag;  . TONSILLECTOMY    . TUBAL LIGATION      Family History  Problem Relation Age of Onset  . Colon cancer Father   . Lung cancer Father        was a former smoker  . Cancer Father        lung  . Diabetes Father   . Hypertension Mother   . Hypertension Other   . Diabetes Other     Social History   Tobacco Use  . Smoking status: Current Every Day Smoker    Packs/day: 1.00    Years: 53.00    Pack years: 53.00    Types: E-cigarettes, Cigarettes    Start date: 11/19/1960  . Smokeless tobacco: Never Used  . Tobacco comment: down to 0.75ppd  Substance Use Topics  . Alcohol use: Yes    Alcohol/week: 1.0 standard drinks    Types: 1 Standard drinks or equivalent per week  . Drug use: No    No current facility-administered medications for this encounter.   Current Outpatient Medications:  .  glucosamine-chondroitin 500-400 MG tablet, Take 1 tablet by mouth 2 (two) times daily., Disp: , Rfl:  .  albuterol (PROAIR HFA) 108 (90 Base) MCG/ACT inhaler, Inhale 1-2 puffs into the lungs every 6 (six) hours as needed for wheezing or shortness of breath., Disp: 1 Inhaler,  Rfl: 5 .  aspirin 81 MG tablet, Take 81 mg by mouth daily., Disp: , Rfl:  .  atorvastatin (LIPITOR) 80 MG tablet, TAKE 1 TABLET(80 MG) BY MOUTH DAILY, Disp: 90 tablet, Rfl: 0 .  BREO ELLIPTA 100-25 MCG/INH AEPB, INHALE 1 PUFF BY MOUTH EVERY DAY, Disp: 60 each, Rfl: 5 .  buPROPion (WELLBUTRIN XL) 150 MG 24 hr tablet, TAKE 1 TABLET(150 MG) BY MOUTH DAILY, Disp: 90 tablet, Rfl: 0 .  carisoprodol (SOMA) 350 MG tablet, TAKE 1 TABLET BY MOUTH TWICE DAILY AS NEEDED FOR MUSCLE SPASMS, Disp: 60 tablet, Rfl: 1 .  Cholecalciferol (VITAMIN D-3) 1000 UNITS CAPS, Take 1 capsule by mouth daily., Disp: , Rfl:  .  citalopram (CELEXA) 40 MG tablet, TAKE 1 TABLET(40 MG) BY MOUTH DAILY, Disp: 90 tablet, Rfl: 0 .  Coenzyme Q10 (COQ10) 400 MG CAPS, Take 400 mg by mouth daily.,  Disp: , Rfl:  .  ezetimibe (ZETIA) 10 MG tablet, Take 1 tablet (10 mg total) by mouth daily., Disp: 90 tablet, Rfl: 3 .  fluticasone (FLONASE) 50 MCG/ACT nasal spray, Place 2 sprays into both nostrils daily., Disp: 16 g, Rfl: 6 .  Glycopyrrolate-Formoterol (BEVESPI AEROSPHERE) 9-4.8 MCG/ACT AERO, Inhale 2 puffs into the lungs 2 (two) times daily., Disp: 1 Inhaler, Rfl: 0 .  levothyroxine (SYNTHROID, LEVOTHROID) 100 MCG tablet, Take 1 tablet (100 mcg total) by mouth daily before breakfast., Disp: 90 tablet, Rfl: 1 .  loratadine (CLARITIN) 10 MG tablet, Take 10 mg by mouth daily., Disp: , Rfl:  .  metoprolol tartrate (LOPRESSOR) 25 MG tablet, Take 1 tablet (25 mg total) by mouth 2 (two) times daily. (Patient taking differently: Take 50 mg by mouth daily. ), Disp: 60 tablet, Rfl: 0 .  Multiple Vitamin (MULTIVITAMIN) capsule, Take 1 capsule by mouth daily., Disp: , Rfl:  .  mupirocin ointment (BACTROBAN) 2 %, Apply 1 application topically 2 (two) times daily as needed., Disp: 22 g, Rfl: 0 .  nitroGLYCERIN (NITROSTAT) 0.4 MG SL tablet, Place 1 tablet (0.4 mg total) under the tongue every 5 (five) minutes as needed., Disp: 25 tablet, Rfl: 3 .  omeprazole (PRILOSEC) 20 MG capsule, TAKE 1 CAPSULE BY MOUTH EVERY DAY, Disp: 90 capsule, Rfl: 0  Allergies  Allergen Reactions  . Benzocaine     Tongue swelling  . Darvon [Propoxyphene Hcl]     HA  . Monosodium Glutamate Diarrhea  . Neosporin [Neomycin-Bacitracin Zn-Polymyx]     blisters  . Nicoderm [Nicotine]     arrythmia  . Prednisone     Cannot tolerate high doses per patient- caused depressive thoughts     ROS  As noted in HPI.   Physical Exam  BP 140/68 (BP Location: Left Arm)   Pulse 60   Temp 98.1 F (36.7 C) (Oral)   Resp 16   Ht 5\' 6"  (1.676 m)   Wt 68.9 kg   SpO2 96%   BMI 24.53 kg/m   Constitutional: Well developed, well nourished, no acute distress Eyes:  EOMI, conjunctiva normal bilaterally HENT: Normocephalic,  atraumatic,mucus membranes moist Respiratory: Normal inspiratory effort Cardiovascular: Normal rate GI: nondistended skin: No rash, skin intact Musculoskeletal: Normal appearance right foot.  Skin intact.  No erythema.  No obvious swelling.  No bruising.  Positive tenderness along the medial arch.  No other tenderness along the plantar surface of the foot.  No calcaneal tenderness.  No Achilles tendon tenderness.  No midfoot tenderness.  No tenderness over the base of the fifth metatarsal.  No tenderness over any of the other metatarsals.  DP 2+.  Sensation distally grossly intact to light touch and temperature.  Ankle normal appearance, nontender, patient has full painless AROM. Neurologic: Alert & oriented x 3, no focal neuro deficits Psychiatric: Speech and behavior appropriate   ED Course   Medications - No data to display  Orders Placed This Encounter  Procedures  . DG Foot Complete Right    Standing Status:   Standing    Number of Occurrences:   1    Order Specific Question:   Reason for Exam (SYMPTOM  OR DIAGNOSIS REQUIRED)    Answer:   pain    No results found for this or any previous visit (from the past 24 hour(s)). Dg Foot Complete Right  Result Date: 12/29/2018 CLINICAL DATA:  Right heel pain for the past 2 days. No known injury. EXAM: RIGHT FOOT COMPLETE - 3+ VIEW COMPARISON:  None. FINDINGS: Small, corticated ossicles adjacent to the base of the 5th metatarsal and proximal to the medial aspect of the navicular. Moderately large inferior calcaneal spur. Minimal dorsal tarsal spur formation. No fracture or dislocation. IMPRESSION: Degenerative changes, as described above with a moderately large inferior calcaneal spur. Electronically Signed   By: Claudie Revering M.D.   On: 12/29/2018 11:36    ED Clinical Impression  Foot pain, right   ED Assessment/Plan  Do not think that is plantar fasciitis because she is tender just in the arch of her foot rather than that over the  entire plantar fascia.  No evidence of infection.  Skin is intact.  Ankle is normal.  Reviewed imaging independently.  No obvious fracture.  Small corticated ossicles adjacent to the base of the fifth metatarsal and proximal to the medial aspect of the navicular.  Moderately large inferior calcaneal spur.  see radiology report for full details.  I suspect that this is due to her bone spur although plantar fasciitis is in the differential.  Will try heel cups, gentle stretching, ice, thousand milligrams of Tylenol 3 times a day.  No NSAIDs due to chronic kidney disease.  Will refer to Dr. Elvina Mattes, podiatrist on-call if not better in a week.  Discussed  imaging, MDM, treatment plan, and plan for follow-up with patient.  patient agrees with plan.   No orders of the defined types were placed in this encounter.   *This clinic note was created using Dragon dictation software. Therefore, there may be occasional mistakes despite careful proofreading.   ?    Melynda Ripple, MD 12/30/18 1147

## 2018-12-30 ENCOUNTER — Other Ambulatory Visit: Payer: Self-pay | Admitting: Family Medicine

## 2019-01-06 ENCOUNTER — Other Ambulatory Visit: Payer: Self-pay | Admitting: Family Medicine

## 2019-01-08 ENCOUNTER — Ambulatory Visit (INDEPENDENT_AMBULATORY_CARE_PROVIDER_SITE_OTHER): Payer: Medicare Other | Admitting: Psychology

## 2019-01-08 DIAGNOSIS — F4323 Adjustment disorder with mixed anxiety and depressed mood: Secondary | ICD-10-CM

## 2019-01-15 DIAGNOSIS — M5416 Radiculopathy, lumbar region: Secondary | ICD-10-CM | POA: Diagnosis not present

## 2019-01-15 DIAGNOSIS — M9903 Segmental and somatic dysfunction of lumbar region: Secondary | ICD-10-CM | POA: Diagnosis not present

## 2019-01-15 DIAGNOSIS — M9901 Segmental and somatic dysfunction of cervical region: Secondary | ICD-10-CM | POA: Diagnosis not present

## 2019-01-15 DIAGNOSIS — M5033 Other cervical disc degeneration, cervicothoracic region: Secondary | ICD-10-CM | POA: Diagnosis not present

## 2019-01-30 ENCOUNTER — Ambulatory Visit (INDEPENDENT_AMBULATORY_CARE_PROVIDER_SITE_OTHER): Payer: Medicare Other | Admitting: Pulmonary Disease

## 2019-01-30 ENCOUNTER — Encounter: Payer: Self-pay | Admitting: Pulmonary Disease

## 2019-01-30 VITALS — BP 134/60 | HR 51 | Ht 65.0 in | Wt 152.8 lb

## 2019-01-30 DIAGNOSIS — R0609 Other forms of dyspnea: Secondary | ICD-10-CM

## 2019-01-30 DIAGNOSIS — J432 Centrilobular emphysema: Secondary | ICD-10-CM

## 2019-01-30 DIAGNOSIS — R0600 Dyspnea, unspecified: Secondary | ICD-10-CM

## 2019-01-30 DIAGNOSIS — F17200 Nicotine dependence, unspecified, uncomplicated: Secondary | ICD-10-CM | POA: Diagnosis not present

## 2019-01-30 MED ORDER — BUDESONIDE-FORMOTEROL FUMARATE 80-4.5 MCG/ACT IN AERO: 2.0000 | INHALATION_SPRAY | Freq: Every day | RESPIRATORY_TRACT | 5 refills | Status: DC

## 2019-01-30 NOTE — Progress Notes (Signed)
Subjective:    Patient ID: Jodi Beasley, female    DOB: 24-Sep-1946, 73 y.o.   MRN: [id number]  Synopsis: Jodi Beasley established care with the [facility] pulmonary clinic in May 2013 for COPD. She simple spirometry at that time with a ratio of 71%, the flow volume loop consistent with obstruction and an FEV1 of 1.92 L or 77% predicted. She was followed previously by Dr. Efraim Kaufmann, a pulmonologist in the [place], [place] area. She had been treated for pneumonia 3 weeks prior to her initial visit.  She has smoked one half pack of cigarettes per day for 53 years and continues to smoke.  HPI Chief Complaint  Patient presents with  . Follow-up    back on Breo 100 not bevespi, shortness of breath more towards the afternoon, wheezing and coughing with clear mucus production.   Tess said she had a cold a few weeks ago and she is having increasing chest congestion.  However she has not had fevers or chills or worsening shortness of breath.  She thinks that the Brio does not help throughout the full day because she will have some shortness of breath in the late afternoon, this is been a problem ever since she started taking that medicine.  Bivespi was too expensive. She has worsening kidney dysfunction, she follows with a nephrologist in [place]. She has been more depressed recently, still smoking cigarettes, now in counseling.  Past Medical History:  Diagnosis Date  . Arrhythmia   . Chicken pox   . COPD (chronic obstructive pulmonary disease) ([place])   . Coronary artery disease    NSTEMI in 09/2008. Cath : 80% RCA and 95% first diagonal. PCI and 2 DES placement  RCA and 1 DES to diagonal. Complicated by acute diagonal stent thrombosis . Treated by thrombectomy. Most recent nuclear stress test in 2010 showed no ischemia with normal EF.   Marland Kitchen Depression   . GERD (gastroesophageal reflux disease)   . Hay fever   . History of blood transfusion   . History of hypercholesterolemia   .  History of syncope 2000   negative EP study  . Hyperlipidemia    intolerance to statins except Crestor.   . Hypertension   . Hypothyroid   . MI (myocardial infarction) ([place])   . Pneumonia, organism unspecified(486)   . Sepsis ([place]) 04/27/2018    Review of Systems  Constitutional: Negative for fatigue, fever and unexpected weight change.  HENT: Negative for congestion, postnasal drip, rhinorrhea and sinus pressure.   Respiratory: Negative for cough, choking and shortness of breath.   Cardiovascular: Negative for chest pain and leg swelling.       Objective:   Physical Exam Vitals:   01/30/19 1108  BP: 134/60  Pulse: (!) 51  SpO2: 95%  Weight: 152 lb 12.8 oz (69.3 kg)  Height: 5\' 5"  (1.651 m)   room air  Gen: well appearing HENT: OP clear, TM's clear, neck supple PULM: CTA B, normal percussion CV: RRR, no mgr, trace edema GI: BS+, soft, nontender Derm: no cyanosis or rash Psyche: normal mood and affect   PFT: 04/09/2012 Simple Spirometry: Ratio 71%, FEV1 1.92L (77% pred); flow volume loop consistent with obstruction 03/2016 PFT> FEV1 1.73 L (74%, > 30% change with bronchodilator)  Records from her hospitalization in 04/2018 for diarrhea and e coli bacteremia revieweed, she was treated with IV antibiotics; the discharge summary indicates she had pneumonia but on my review of her CXR she had no infiltrate  Assessment & Plan:   Centrilobular emphysema ([place])  Smoker  Dyspnea on exertion  Discussion  Jodi Beasley has had some increased chest congestion recently, but she has not had an exacerbation of her COPD.  Her lungs are clear on physical exam today.  She has been depressed, unfortunately still smoking cigarettes.  She does not feel that Breo lasts all day long so she needs to change to a twice a day inhaler.  Bevespi was too expensive.  Plan: COPD: Stop taking Breo Start taking Symbicort 80/4.52 puffs twice a day, if this is too expensive have your pharmacist check to  see which alternatives exist and then call us so that we can go over this list Keep using albuterol as needed for chest tightness wheezing or shortness of breath Stay physically active Practice good hand hygiene  Increasing chest congestion: Try taking over-the-counter guaifenesin 600 mg extended release twice a day, drink plenty of fluids Call me if this worsens  Cigarette smoking: Do your best to quit smoking as soon as possible Keep plans for the lung cancer screening CT in May 2020  I think it is good to stay physically active considering everything you have going on.  Try to walk at least 20 minutes a day.  We will see you back in 4 to 6 months or sooner if needed  Updated Medication List Outpatient Encounter Medications as of 01/30/2019  Medication Sig  . albuterol (PROAIR HFA) 108 (90 Base) MCG/ACT inhaler Inhale 1-2 puffs into the lungs every 6 (six) hours as needed for wheezing or shortness of breath.  Marland Kitchen aspirin 81 MG tablet Take 81 mg by mouth daily.  Marland Kitchen atorvastatin (LIPITOR) 80 MG tablet TAKE 1 TABLET(80 MG) BY MOUTH DAILY  . buPROPion (WELLBUTRIN XL) 150 MG 24 hr tablet TAKE 1 TABLET(150 MG) BY MOUTH DAILY  . carisoprodol (SOMA) 350 MG tablet TAKE 1 TABLET BY MOUTH TWICE DAILY AS NEEDED FOR MUSCLE SPASMS  . Cholecalciferol (VITAMIN D-3) 1000 UNITS CAPS Take 1 capsule by mouth daily.  . citalopram (CELEXA) 40 MG tablet TAKE 1 TABLET(40 MG) BY MOUTH DAILY  . Coenzyme Q10 (COQ10) 400 MG CAPS Take 400 mg by mouth daily.  Marland Kitchen ezetimibe (ZETIA) 10 MG tablet Take 1 tablet (10 mg total) by mouth daily.  . fluticasone (FLONASE) 50 MCG/ACT nasal spray Place 2 sprays into both nostrils daily.  Marland Kitchen glucosamine-chondroitin 500-400 MG tablet Take 1 tablet by mouth 2 (two) times daily.  Marland Kitchen levothyroxine (SYNTHROID, LEVOTHROID) 100 MCG tablet Take 1 tablet (100 mcg total) by mouth daily before breakfast.  . loratadine (CLARITIN) 10 MG tablet Take 10 mg by mouth daily.  . Multiple Vitamin  (MULTIVITAMIN) capsule Take 1 capsule by mouth daily.  . mupirocin ointment (BACTROBAN) 2 % Apply 1 application topically 2 (two) times daily as needed.  . nitroGLYCERIN (NITROSTAT) 0.4 MG SL tablet Place 1 tablet (0.4 mg total) under the tongue every 5 (five) minutes as needed.  Marland Kitchen omeprazole (PRILOSEC) 20 MG capsule TAKE 1 CAPSULE BY MOUTH EVERY DAY  . [DISCONTINUED] BREO ELLIPTA 100-25 MCG/INH AEPB INHALE 1 PUFF BY MOUTH EVERY DAY  . budesonide-formoterol (SYMBICORT) 80-4.5 MCG/ACT inhaler Inhale 2 puffs into the lungs daily.  . metoprolol tartrate (LOPRESSOR) 25 MG tablet Take 1 tablet (25 mg total) by mouth 2 (two) times daily. (Patient taking differently: Take 50 mg by mouth daily. )  . [DISCONTINUED] Glycopyrrolate-Formoterol (BEVESPI AEROSPHERE) 9-4.8 MCG/ACT AERO Inhale 2 puffs into the lungs 2 (two) times daily. (Patient not  taking: Reported on 01/30/2019)   No facility-administered encounter medications on file as of 01/30/2019.

## 2019-01-30 NOTE — Patient Instructions (Signed)
COPD: Stop taking Breo Start taking Symbicort 80/4.52 puffs twice a day, if this is too expensive have your pharmacist check to see which alternatives exist and then call us so that we can go over this list Keep using albuterol as needed for chest tightness wheezing or shortness of breath Stay physically active Practice good hand hygiene  Increasing chest congestion: Try taking over-the-counter guaifenesin 600 mg extended release twice a day, drink plenty of fluids Call me if this worsens  Cigarette smoking: Do your best to quit smoking as soon as possible Keep plans for the lung cancer screening CT in May 2020  I think it is good to stay physically active considering everything you have going on.  Try to walk at least 20 minutes a day.  We will see you back in 4 to 6 months or sooner if needed

## 2019-01-31 ENCOUNTER — Other Ambulatory Visit: Payer: Self-pay | Admitting: Family Medicine

## 2019-02-02 ENCOUNTER — Other Ambulatory Visit: Payer: Self-pay | Admitting: Family Medicine

## 2019-02-04 ENCOUNTER — Telehealth: Payer: Self-pay

## 2019-02-04 ENCOUNTER — Ambulatory Visit (INDEPENDENT_AMBULATORY_CARE_PROVIDER_SITE_OTHER): Payer: Medicare Other | Admitting: Psychology

## 2019-02-04 DIAGNOSIS — F3289 Other specified depressive episodes: Secondary | ICD-10-CM

## 2019-02-04 NOTE — Telephone Encounter (Signed)
3/16/ at 11 scheduled can patient keep.

## 2019-02-04 NOTE — Telephone Encounter (Signed)
Patient can keep appt.  Gae Bon, CMA

## 2019-02-04 NOTE — Telephone Encounter (Signed)
Copied from [place] [id number]. Topic: Appointment Scheduling - Scheduling Inquiry for Clinic >> Feb 04, 2019  3:26 PM Virl Axe D wrote: Reason for CRM: Pt called to schedule appt for Dr. Caryl Bis regarding her depression and anti anxiety medication. She is scheduled to see him on 04/24/19. Nothing available before then. Please pt advise if she can be seen sooner or by another provider. No expression of harming herself or others. She would like to discuss her anti anxiety medication and how it has not been adjusted in about 10 years and thinks this might help with depression.

## 2019-02-05 NOTE — Telephone Encounter (Signed)
Patient scheduled.

## 2019-02-09 ENCOUNTER — Other Ambulatory Visit: Payer: Self-pay | Admitting: Family Medicine

## 2019-02-12 DIAGNOSIS — M9901 Segmental and somatic dysfunction of cervical region: Secondary | ICD-10-CM | POA: Diagnosis not present

## 2019-02-12 DIAGNOSIS — M9903 Segmental and somatic dysfunction of lumbar region: Secondary | ICD-10-CM | POA: Diagnosis not present

## 2019-02-12 DIAGNOSIS — M5416 Radiculopathy, lumbar region: Secondary | ICD-10-CM | POA: Diagnosis not present

## 2019-02-12 DIAGNOSIS — M5033 Other cervical disc degeneration, cervicothoracic region: Secondary | ICD-10-CM | POA: Diagnosis not present

## 2019-02-18 ENCOUNTER — Ambulatory Visit: Payer: Self-pay | Admitting: Family Medicine

## 2019-02-19 ENCOUNTER — Ambulatory Visit (INDEPENDENT_AMBULATORY_CARE_PROVIDER_SITE_OTHER): Payer: Medicare Other

## 2019-02-19 ENCOUNTER — Other Ambulatory Visit: Payer: Self-pay

## 2019-02-19 VITALS — BP 122/70 | HR 59 | Temp 98.5°F | Resp 16 | Ht 64.0 in | Wt 156.8 lb

## 2019-02-19 DIAGNOSIS — Z0000 Encounter for general adult medical examination without abnormal findings: Secondary | ICD-10-CM

## 2019-02-19 NOTE — Progress Notes (Signed)
Subjective:   Jodi Beasley is a 73 y.o. female who presents for Medicare Annual (Subsequent) preventive examination.  Review of Systems:  No ROS.  Medicare Wellness Visit. Additional risk factors are reflected in the social history. Cardiac Risk Factors include: advanced age (>2men, >66 women);hypertension     Objective:     Vitals: BP 122/70 (BP Location: Left Arm, Patient Position: Sitting, Cuff Size: Normal)   Pulse (!) 59   Temp 98.5 F (36.9 C) (Oral)   Resp 16   Ht 5\' 4"  (1.626 m)   Wt 156 lb 12.8 oz (71.1 kg)   SpO2 98%   BMI 26.91 kg/m   Body mass index is 26.91 kg/m.  Advanced Directives 02/19/2019 12/29/2018 05/18/2018 04/16/2018 02/16/2018 03/27/2017 03/25/2017  Does Patient Have a Medical Advance Directive? Yes Yes Yes Yes Yes Yes Yes  Type of Paramedic of [place];Living will [place];Living will [place];Living will Living will;Healthcare Power of [place];Living will Living will Living will  Does patient want to make changes to medical advance directive? No - Patient declined - - No - Patient declined No - Patient declined - -  Copy of [place] in Chart? No - copy requested - - No - copy requested No - copy requested - -    Tobacco Social History   Tobacco Use  Smoking Status Current Every Day Smoker  . Packs/day: 1.00  . Years: 53.00  . Pack years: 53.00  . Types: E-cigarettes, Cigarettes  . Start date: 11/19/1960  Smokeless Tobacco Never Used  Tobacco Comment   down to 0.75ppd 1pk a day 01/30/19     Ready to quit: Not Answered Counseling given: Not Answered Comment: down to 0.75ppd 1pk a day 01/30/19   Clinical Intake:  Pre-visit preparation completed: Yes  Pain : No/denies pain     Diabetes: No  How often do you need to have someone help you when you read instructions, pamphlets, or other written materials from your doctor or  pharmacy?: 1 - Never  Interpreter Needed?: No     Past Medical History:  Diagnosis Date  . Arrhythmia   . Chicken pox   . COPD (chronic obstructive pulmonary disease) ([place])   . Coronary artery disease    NSTEMI in 09/2008. Cath : 80% RCA and 95% first diagonal. PCI and 2 DES placement  RCA and 1 DES to diagonal. Complicated by acute diagonal stent thrombosis . Treated by thrombectomy. Most recent nuclear stress test in 2010 showed no ischemia with normal EF.   Marland Kitchen Depression   . GERD (gastroesophageal reflux disease)   . Hay fever   . History of blood transfusion   . History of hypercholesterolemia   . History of syncope 2000   negative EP study  . Hyperlipidemia    intolerance to statins except Crestor.   . Hypertension   . Hypothyroid   . MI (myocardial infarction) ([place])   . Pneumonia, organism unspecified(486)   . Sepsis ([place]) 04/27/2018   Past Surgical History:  Procedure Laterality Date  . CORONARY ANGIOPLASTY WITH STENT PLACEMENT  2009   [facility], drug-eluting mid RCA,prox diag;  . TONSILLECTOMY    . TUBAL LIGATION     Family History  Problem Relation Age of Onset  . Colon cancer Father   . Lung cancer Father        was a former smoker  . Cancer Father  lung  . Diabetes Father   . Hypertension Mother   . Hypertension Other   . Diabetes Other    Social History   Socioeconomic History  . Marital status: Widowed    Spouse name: Not on file  . Number of children: Not on file  . Years of education: Not on file  . Highest education level: Not on file  Occupational History  . Not on file  Social Needs  . Financial resource strain: Not hard at all  . Food insecurity:    Worry: Never true    Inability: Never true  . Transportation needs:    Medical: No    Non-medical: No  Tobacco Use  . Smoking status: Current Every Day Smoker    Packs/day: 1.00    Years: 53.00    Pack years: 53.00    Types: E-cigarettes, Cigarettes    Start date: 11/19/1960   . Smokeless tobacco: Never Used  . Tobacco comment: down to 0.75ppd 1pk a day 01/30/19  Substance and Sexual Activity  . Alcohol use: Yes    Alcohol/week: 1.0 standard drinks    Types: 1 Standard drinks or equivalent per week  . Drug use: No  . Sexual activity: Yes  Lifestyle  . Physical activity:    Days per week: Not on file    Minutes per session: Not on file  . Stress: Not at all  Relationships  . Social connections:    Talks on phone: Not on file    Gets together: Not on file    Attends religious service: Not on file    Active member of club or organization: Not on file    Attends meetings of clubs or organizations: Not on file    Relationship status: Not on file  Other Topics Concern  . Not on file  Social History Narrative   Lives with mother in [place]. 2 cats 2 dogs in home.   Recently moved from [facility].   Custody of 79month old.    Outpatient Encounter Medications as of 02/19/2019  Medication Sig  . albuterol (PROAIR HFA) 108 (90 Base) MCG/ACT inhaler Inhale 1-2 puffs into the lungs every 6 (six) hours as needed for wheezing or shortness of breath.  Marland Kitchen aspirin 81 MG tablet Take 81 mg by mouth daily.  Marland Kitchen atorvastatin (LIPITOR) 80 MG tablet TAKE 1 TABLET(80 MG) BY MOUTH DAILY  . budesonide-formoterol (SYMBICORT) 80-4.5 MCG/ACT inhaler Inhale 2 puffs into the lungs daily.  Marland Kitchen buPROPion (WELLBUTRIN XL) 150 MG 24 hr tablet TAKE 1 TABLET(150 MG) BY MOUTH DAILY  . carisoprodol (SOMA) 350 MG tablet TAKE 1 TABLET BY MOUTH TWICE DAILY AS NEEDED FOR MUSCLE SPASMS  . Cholecalciferol (VITAMIN D-3) 1000 UNITS CAPS Take 1 capsule by mouth daily.  . citalopram (CELEXA) 40 MG tablet TAKE 1 TABLET(40 MG) BY MOUTH DAILY  . Coenzyme Q10 (COQ10) 400 MG CAPS Take 400 mg by mouth daily.  Marland Kitchen ezetimibe (ZETIA) 10 MG tablet Take 1 tablet (10 mg total) by mouth daily.  . fluticasone (FLONASE) 50 MCG/ACT nasal spray Place 2 sprays into both nostrils daily.  Marland Kitchen glucosamine-chondroitin 500-400 MG  tablet Take 1 tablet by mouth 2 (two) times daily.  Marland Kitchen levothyroxine (SYNTHROID, LEVOTHROID) 100 MCG tablet TAKE 1 TABLET(100 MCG) BY MOUTH DAILY BEFORE BREAKFAST  . loratadine (CLARITIN) 10 MG tablet Take 10 mg by mouth daily.  . Multiple Vitamin (MULTIVITAMIN) capsule Take 1 capsule by mouth daily.  . mupirocin ointment (BACTROBAN) 2 % Apply 1  application topically 2 (two) times daily as needed.  . nitroGLYCERIN (NITROSTAT) 0.4 MG SL tablet Place 1 tablet (0.4 mg total) under the tongue every 5 (five) minutes as needed.  Marland Kitchen omeprazole (PRILOSEC) 20 MG capsule TAKE 1 CAPSULE BY MOUTH EVERY DAY  . Psyllium (METAMUCIL FIBER PO) Take 1 Package by mouth daily as needed.  . metoprolol tartrate (LOPRESSOR) 25 MG tablet Take 1 tablet (25 mg total) by mouth 2 (two) times daily. (Patient taking differently: Take 50 mg by mouth daily. )   No facility-administered encounter medications on file as of 02/19/2019.     Activities of Daily Living In your present state of health, do you have any difficulty performing the following activities: 02/19/2019 04/16/2018  Hearing? Y N  Comment Hearing aids -  Vision? N N  Difficulty concentrating or making decisions? N N  Walking or climbing stairs? Y Y  Comment Hx of COPD -  Dressing or bathing? N N  Doing errands, shopping? N N  Preparing Food and eating ? N -  Using the Toilet? N -  In the past six months, have you accidently leaked urine? N -  Do you have problems with loss of bowel control? N -  Managing your Medications? N -  Managing your Finances? N -  Housekeeping or managing your Housekeeping? N -  Some recent data might be hidden    Patient Care Team: Leone Haven, MD as PCP - General (Family Medicine) Croitoru, Dani Gobble, MD as PCP - Cardiology (Cardiology)    Assessment:   This is a routine wellness examination for Kirra.  Return in 2 days for follow up with pcp.   Health Screenings  Mammogram -06/13/18 Colonoscopy -04/19/10 Bone  Density -07/08/15 Glaucoma -none Hearing -hearing aids Hemoglobin A1C -12/27/18 (6.2) LDL Cholesterol -12/27/18 (72.0) Dental-every 6 months Vision-annual visits  Social  Alcohol intake -yes, 1 drink per week  Smoking history- current, self Smokers in home? Illicit drug use? none Exercise -walking, yoga Diet -regular Sexually Active -yes  Safety  Patient feels safe at home.  Patient does have smoke detectors at home  Patient does wear sunscreen or protective clothing when in direct sunlight  Patient does wear seat belt when driving or riding with others.   Activities of Daily Living Patient can do their own household chores. Denies needing assistance with: driving, feeding themselves, getting from bed to chair, getting to the toilet, bathing/showering, dressing, managing money, climbing flight of stairs, or preparing meals.   Depression Screen Declined screening. Currently in counseling.  Denies self harm or harm towards others.  Followed by pcp.   Fall Screen Patient denies being afraid of falling or falling in the last year.   Memory Screen Patient denies problems with memory, misplacing items, and is able to balance checkbook/bank accounts.  Patient is alert, normal appearance, oriented to person/place/and time. Correctly identified the president of the [place], recall of 3/3 objects, and performing simple calculations.  Patient displays appropriate judgement and can read correct time from watch face.   Immunizations The following Immunizations are up to date: Influenza, shingles, pneumonia, and tetanus.   Other Providers Patient Care Team: Leone Haven, MD as PCP - General (Family Medicine) Croitoru, Dani Gobble, MD as PCP - Cardiology (Cardiology)  Exercise Activities and Dietary recommendations Current Exercise Habits: Home exercise routine, Type of exercise: walking;yoga, Time (Minutes): 20, Frequency (Times/Week): 1, Weekly Exercise (Minutes/Week): 20, Intensity: Mild   Goals      Patient Stated   .  Increase physical activity (pt-stated)     As tolerated       Fall Risk Fall Risk  02/19/2019 11/21/2018 02/16/2018 12/27/2017 12/12/2017  Falls in the past year? 0 1 No Yes Yes  Comment - - - - -  Number falls in past yr: - 1 - 1 1  Comment - - - - fell over parking lot after mother passed away  Injury with Fall? - 0 - Yes Yes  Comment - - - - -  Risk for fall due to : - - - Impaired balance/gait -  Follow up - - - Education provided Education provided;Falls prevention discussed  Comment - - - vert, out of alignment, going to chiropactor -   Depression Screen PHQ 2/9 Scores 02/19/2019 11/21/2018 02/16/2018 12/27/2017  PHQ - 2 Score - 0 0 0  PHQ- 9 Score - - - -  Exception Documentation Other- indicate reason in comment box - - -     Cognitive Function MMSE - Mini Mental State Exam 02/15/2017  Orientation to time 5  Orientation to Place 5  Registration 3  Attention/ Calculation 5  Recall 3  Language- name 2 objects 2  Language- repeat 1  Language- follow 3 step command 3  Language- read & follow direction 1  Write a sentence 1  Copy design 1  Total score 30     6CIT Screen 02/19/2019 02/16/2018  What Year? 0 points 0 points  What month? 0 points 0 points  What time? 0 points 0 points  Count back from 20 0 points 0 points  Months in reverse 0 points 0 points  Repeat phrase 0 points 0 points  Total Score 0 0    Immunization History  Administered Date(s) Administered  . Influenza Split 10/22/2012, 09/20/2014, 09/21/2015, 08/15/2016  . Influenza Whole 10/17/2013  . Influenza, High Dose Seasonal PF 08/23/2017, 08/22/2018  . Influenza-Unspecified 09/05/2015  . Pneumococcal Conjugate-13 12/06/2007  . Pneumococcal Polysaccharide-23 11/19/2013  . Td 05/05/2010  . Zoster 04/20/2011   Screening Tests Health Maintenance  Topic Date Due  . COLONOSCOPY  04/19/2020  . TETANUS/TDAP  05/05/2020  . MAMMOGRAM  06/13/2020  . INFLUENZA VACCINE   Completed  . DEXA SCAN  Completed  . Hepatitis C Screening  Completed  . PNA vac Low Risk Adult  Completed      Plan:    End of life planning; Advance aging; Advanced directives discussed. Copy of current HCPOA/Living Will requested.    I have personally reviewed and noted the following in the patient's chart:   . Medical and social history . Use of alcohol, tobacco or illicit drugs  . Current medications and supplements . Functional ability and status . Nutritional status . Physical activity . Advanced directives . List of other physicians . Hospitalizations, surgeries, and ER visits in previous 12 months . Vitals . Screenings to include cognitive, depression, and falls . Referrals and appointments  In addition, I have reviewed and discussed with patient certain preventive protocols, quality metrics, and best practice recommendations. A written personalized care plan for preventive services as well as general preventive health recommendations were provided to patient.     Varney Biles, LPN  01/16/9416

## 2019-02-19 NOTE — Patient Instructions (Addendum)
  Ms. Wolbert , Thank you for taking time to come for your Medicare Wellness Visit. I appreciate your ongoing commitment to your health goals. Please review the following plan we discussed and let me know if I can assist you in the future.   Follow up as needed.    Bring a copy of your [place] and/or Living Will to be scanned into chart.  Have a great day!  These are the goals we discussed: Goals      Patient Stated   . Increase physical activity (pt-stated)     As tolerated       This is a list of the screening recommended for you and due dates:  Health Maintenance  Topic Date Due  . Colon Cancer Screening  04/19/2020  . Tetanus Vaccine  05/05/2020  . Mammogram  06/13/2020  . Flu Shot  Completed  . DEXA scan (bone density measurement)  Completed  .  Hepatitis C: One time screening is recommended by Center for Disease Control  (CDC) for  adults born from [birth year] through 1964.   Completed  . Pneumonia vaccines  Completed

## 2019-02-20 NOTE — Progress Notes (Signed)
Reviewed. Will follow with PCP

## 2019-02-21 ENCOUNTER — Encounter: Payer: Self-pay | Admitting: Family Medicine

## 2019-02-21 ENCOUNTER — Ambulatory Visit (INDEPENDENT_AMBULATORY_CARE_PROVIDER_SITE_OTHER): Payer: Medicare Other | Admitting: Family Medicine

## 2019-02-21 ENCOUNTER — Other Ambulatory Visit: Payer: Self-pay

## 2019-02-21 DIAGNOSIS — J309 Allergic rhinitis, unspecified: Secondary | ICD-10-CM | POA: Diagnosis not present

## 2019-02-21 DIAGNOSIS — K009 Disorder of tooth development, unspecified: Secondary | ICD-10-CM

## 2019-02-21 DIAGNOSIS — F4323 Adjustment disorder with mixed anxiety and depressed mood: Secondary | ICD-10-CM

## 2019-02-21 MED ORDER — ALBUTEROL SULFATE HFA 108 (90 BASE) MCG/ACT IN AERS: 1.0000 | INHALATION_SPRAY | Freq: Four times a day (QID) | RESPIRATORY_TRACT | 5 refills | Status: DC | PRN

## 2019-02-21 MED ORDER — MONTELUKAST SODIUM 10 MG PO TABS: 10.0000 mg | ORAL_TABLET | Freq: Every day | ORAL | 3 refills | Status: DC

## 2019-02-21 NOTE — Patient Instructions (Signed)
Nice to see you. I suspect her symptoms are related to allergies. We will add Singulair to your regimen.  Please continue to use your inhalers.  If you are not improving in the next 3 to 4 days or if you significantly worsen please be reevaluated.  If you develop shortness of breath, cough productive of blood, or fevers please be reevaluated. We will contact you once I speak with our pharmacist and determine how to taper you onto your new medication.

## 2019-02-21 NOTE — Progress Notes (Signed)
[name], MD Phone: [phone]  Jodi Beasley is a 73 y.o. female who presents today for follow-up.  Allergic rhinitis: Patient notes over the last week or so she has had quite a bit of sneezing with postnasal drip and a dry cough.  She does feel congested in her sinuses though she is not blowing anything out of her nose.  No fevers.  She does note slight increase in shortness of breath.  She has been using her albuterol which is beneficial.  She is on Breo currently.  She is using Flonase.  Claritin was not beneficial.  She notes no fevers.  No travel.  No sick contacts.  She has not come in contact with anybody with coronavirus.  Anxiety/depression: Notes she cannot get out of this.  She is on Celexa and Wellbutrin and has been on those for many years.  She notes no SI.  She is back in counseling and thinks that will be helpful.  She has tried lots of self-care.  She has had trouble with this as she has not had a significant other or support system since she had her significant illness last year.    Dental issues: Patient is following with a dentist.  Has numbness and teeth issues that resulted in significant pain when she has cleanings.  In the past her dentist has prescribed hydrocodone and advised her to take this and Soma prior to her cleanings as she cannot use topical numbing medication.  She reports her dentist does not have an electronic system to send narcotics to the pharmacy and suggested that the patient discuss this medication with [place].  Social History   Tobacco Use  Smoking Status Current Every Day Smoker  . Packs/day: 1.00  . Years: 53.00  . Pack years: 53.00  . Types: E-cigarettes, Cigarettes  . Start date: 11/19/1960  Smokeless Tobacco Never Used  Tobacco Comment   down to 0.75ppd 1pk a day 01/30/19     ROS see history of present illness  Objective  Physical Exam Vitals:   02/21/19 1121  BP: 118/80  Pulse: 63  Temp: 98.7 F (37.1 C)  SpO2: 98%     BP Readings from Last 3 Encounters:  02/21/19 118/80  02/19/19 122/70  01/30/19 134/60   Wt Readings from Last 3 Encounters:  02/21/19 156 lb (70.8 kg)  02/19/19 156 lb 12.8 oz (71.1 kg)  01/30/19 152 lb 12.8 oz (69.3 kg)    Physical Exam Constitutional:      General: She is not in acute distress.    Appearance: She is not diaphoretic.  HENT:     Head: Normocephalic and atraumatic.     Comments: No significant sinus tenderness to percussion    Right Ear: Tympanic membrane normal.     Left Ear: Tympanic membrane normal.     Mouth/Throat:     Mouth: Mucous membranes are moist.     Pharynx: Oropharynx is clear.     Comments: Significantly receded gumline in lower anterior mouth Eyes:     Conjunctiva/sclera: Conjunctivae normal.     Pupils: Pupils are equal, round, and reactive to light.  Cardiovascular:     Rate and Rhythm: Normal rate and regular rhythm.     Heart sounds: Normal heart sounds.  Pulmonary:     Effort: Pulmonary effort is normal.     Breath sounds: Normal breath sounds.  Lymphadenopathy:     Cervical: No cervical adenopathy.  Skin:    General: Skin is warm and dry.  Neurological:     Mental Status: She is alert.      Assessment/Plan: Please see individual problem list.  Allergic rhinitis Patient symptoms are most consistent with allergic rhinitis with postnasal drip likely leading to increasing cough.  She has no abnormal lung sounds to indicate a pneumonia.  She has had no wheezing to indicate a COPD issue.  We will start her on Singulair.  Discussed monitoring for psychiatric issues with this medication.  If those occur she will let us know.  We will refill her albuterol.  She will continue on Breo and Flonase.  If not improving she will contact us.  Given return precautions.  Adjustment disorder with mixed anxiety and depressed mood Patient continues to have issues with this.  I would like to switch her from Celexa to Zoloft.  I will send a message to  our clinical pharmacist to determine the best manner in doing this.  She will continue to see her counselor.  She is given return precautions.  Dental anomaly I will have my CMA contact the patient's dentist to get more details regarding the patient's pain regimen for her cleanings.   No orders of the defined types were placed in this encounter.   Meds ordered this encounter  Medications  . montelukast (SINGULAIR) 10 MG tablet    Sig: Take 1 tablet (10 mg total) by mouth at bedtime.    Dispense:  30 tablet    Refill:  3  . albuterol (PROAIR HFA) 108 (90 Base) MCG/ACT inhaler    Sig: Inhale 1-2 puffs into the lungs every 6 (six) hours as needed for wheezing or shortness of breath.    Dispense:  1 Inhaler    Refill:  [place], MD [place]

## 2019-02-22 ENCOUNTER — Telehealth: Payer: Self-pay | Admitting: Family Medicine

## 2019-02-22 DIAGNOSIS — K009 Disorder of tooth development, unspecified: Secondary | ICD-10-CM | POA: Insufficient documentation

## 2019-02-22 NOTE — Assessment & Plan Note (Signed)
Patient continues to have issues with this.  I would like to switch her from Celexa to Zoloft.  I will send a message to our clinical pharmacist to determine the best manner in doing this.  She will continue to see her counselor.  She is given return precautions.

## 2019-02-22 NOTE — Assessment & Plan Note (Signed)
I will have my CMA contact the patient's dentist to get more details regarding the patient's pain regimen for her cleanings.

## 2019-02-22 NOTE — Telephone Encounter (Signed)
Called and spoke with pt to get the phone number to call the dentis office @ [phone]. Called the office and they are closed due to the coronavirus.

## 2019-02-22 NOTE — Assessment & Plan Note (Signed)
Patient symptoms are most consistent with allergic rhinitis with postnasal drip likely leading to increasing cough.  She has no abnormal lung sounds to indicate a pneumonia.  She has had no wheezing to indicate a COPD issue.  We will start her on Singulair.  Discussed monitoring for psychiatric issues with this medication.  If those occur she will let us know.  We will refill her albuterol.  She will continue on Breo and Flonase.  If not improving she will contact us.  Given return precautions.

## 2019-02-22 NOTE — Telephone Encounter (Signed)
I saw this patient in clinic yesterday.  She follows with a dentist, Jenny Reichmann [place], and the patient noted that she typically requires hydrocodone for her dental cleanings.  The patient noted that the dentist could not prescribe this as they did not have an electronic prescription system.  Could you contact this dentist and find out if they are able to prescribe the hydrocodone for the patient's dental cleanings?  If they are not able to prescribe the hydrocodone please find out what regimen they have been prescribing patient

## 2019-02-23 ENCOUNTER — Telehealth: Payer: Self-pay | Admitting: Family Medicine

## 2019-02-23 NOTE — Telephone Encounter (Signed)
-----   Message from De Hollingshead, [facility] sent at 02/22/2019  4:31 PM EDT ----- I think that would be fine, yes!   Catie ----- Message ----- From: Leone Haven, MD Sent: 02/22/2019  10:29 AM EDT To: De Hollingshead, [facility]  Hey Catie,   This patient is currently on celexa 40 mg daily and wellbutrin for depression. I would like to switch her to zoloft. Would the best thing be to decrease the celexa to 20 mg for one week then change to zoloft 50 mg daily and then increase to zoloft 100 mg daily? Or is there a better tapering schedule? Thanks.  Randall Hiss

## 2019-02-23 NOTE — Telephone Encounter (Signed)
Please let the patient know that I heard back from the pharmacist regarding her antidepressant switch. I would like for the patient to decrease her celexa to 20 mg daily for one week then we will have her discontinue this and start on zoloft 50 mg daily for one week then she will increase to 100 mg daily. I can send in the zoloft once you speak with her. Thanks.

## 2019-02-25 MED ORDER — SERTRALINE HCL 50 MG PO TABS: ORAL_TABLET | ORAL | 3 refills | Status: DC

## 2019-02-25 NOTE — Telephone Encounter (Signed)
Called and spoke with patient. Pt advised and voiced understanding.  

## 2019-02-25 NOTE — Telephone Encounter (Signed)
Filled on 11/23/2017  Next appt for dental appt Aug 11/2019 , last seen on 01/24/19/20.   Pt has two pills left but they are expired   Rx Norco 5-325 mg disp 5

## 2019-02-25 NOTE — Telephone Encounter (Signed)
Zoloft sent to pharmacy.  She will continue the Wellbutrin.  Please make sure she understands the tapering instructions for the Celexa and then how to start on the Zoloft per my prior message.  Thanks.

## 2019-02-25 NOTE — Telephone Encounter (Signed)
It appears she has not had this refilled by her dentist since 2018.  I am happy to consider filling this if her dentist is unable to do so though we do need to speak with her dentist first.  If they are closed she will not need this medication until she is scheduled for a dental appointment.  Please find out when her next dental appointment is and contact the dentists office around that time to confirm the need for this medication.

## 2019-02-25 NOTE — Addendum Note (Signed)
Addended by: Leone Haven on: 02/25/2019 04:41 PM   Modules accepted: Orders

## 2019-02-25 NOTE — Telephone Encounter (Signed)
Called and spoke with pt called the dentist office and they are closed. Called and spoke with pt to find out what she was prescribed in the past dose amount and disp amount. Pt stated that she has had the prescription refilled before at her pharmacy and I called [facility] and pt has nothing on file that has been refilled for the [facility].   Sent to PCP as an FYI   When I spoke with pt she stated that she takes the medication because her gums are sensitive to the gel they give her and that is way she takes the [facility] because the nerves in her gums are very sensitive she stated that she would normally only get a disp amount of 5 pills and that would last her all year for dental appts.   I don't know if you could look in PMP and see where she has had this filled before in the past or by who.

## 2019-02-25 NOTE — Telephone Encounter (Signed)
Called and spoke with pt. Pt advised and would like to start the Zoloft. Sent to PCP to send in RX.  Plus pt wanted to know would she continue taking the Wellbutrin as usual?

## 2019-03-05 ENCOUNTER — Ambulatory Visit: Payer: Medicare Other | Admitting: Psychology

## 2019-03-06 ENCOUNTER — Ambulatory Visit: Payer: Medicare Other | Admitting: Psychology

## 2019-03-20 ENCOUNTER — Ambulatory Visit: Payer: Self-pay

## 2019-03-20 ENCOUNTER — Encounter: Payer: Self-pay | Admitting: Family Medicine

## 2019-03-20 NOTE — Telephone Encounter (Signed)
Symptoms start went taking the Zoloft. Pt is only taking 50 mg as of now supposed to increase to 100 mg on Sunday. Pt has been on the 50 mg dose since the first of April.   Pt stated both eye are red and itch puffy and start 30 -35 minutes after taking the medication. Pt would like to discontinue but would need to know how to taper off if needed.   Sent to PCP

## 2019-03-20 NOTE — Telephone Encounter (Signed)
She does not need to taper of zoloft 50 mg daily. I would like to switch her to something different if she is willing.

## 2019-03-20 NOTE — Telephone Encounter (Signed)
Sent to PCP to advise 

## 2019-03-20 NOTE — Telephone Encounter (Signed)
Patient called and says her eyes are red, itching and swollen, she says she thinks it's a reaction to Zoloft. She says it's working for her and she's up to 50 mg, she says she doesn't want to stop taking it. She says she's been using OTC antihistamine eye drops, but it's not helping. I advised I will send over to Dr. Caryl Bis and someone will call with his recommendation.   Summary: Clinical Advice   Patient stated that she is having a reaction to the Zoloft she was prescribed. Patient stated that her eyes are burning really bad and she doesn't want to quit taking the Zoloft. Patient wants clinical advice and any type of OTC recommendation      Answer Assessment - Initial Assessment Questions 1. SYMPTOMS: "Do you have any symptoms?"     Eyes burning, watering 2. SEVERITY: If symptoms are present, ask "Are they mild, moderate or severe?"     Moderate  Protocols used: MEDICATION QUESTION CALL-A-AH

## 2019-03-21 MED ORDER — ESCITALOPRAM OXALATE 10 MG PO TABS: 10.0000 mg | ORAL_TABLET | Freq: Every day | ORAL | 2 refills | Status: DC

## 2019-03-22 NOTE — Telephone Encounter (Signed)
Called and spoke with pt. Pt advised and Dr. Caryl Bis reached out to patient through mychart and started the pt on Lexapro. Advised pt to let us know if she has any issue while taking lexapro.

## 2019-03-26 ENCOUNTER — Ambulatory Visit: Payer: Medicare Other | Admitting: Psychology

## 2019-03-29 ENCOUNTER — Other Ambulatory Visit: Payer: Self-pay | Admitting: Family Medicine

## 2019-03-29 NOTE — Telephone Encounter (Signed)
Last OV 02/21/2019  Last refilled 11/22/2018 disp 60 with 1 refill   Next OV 04/24/2019  Sent to PCP for approval

## 2019-03-30 ENCOUNTER — Other Ambulatory Visit: Payer: Self-pay | Admitting: Family Medicine

## 2019-04-02 DIAGNOSIS — M9903 Segmental and somatic dysfunction of lumbar region: Secondary | ICD-10-CM | POA: Diagnosis not present

## 2019-04-02 DIAGNOSIS — M5033 Other cervical disc degeneration, cervicothoracic region: Secondary | ICD-10-CM | POA: Diagnosis not present

## 2019-04-02 DIAGNOSIS — M5416 Radiculopathy, lumbar region: Secondary | ICD-10-CM | POA: Diagnosis not present

## 2019-04-02 DIAGNOSIS — M9901 Segmental and somatic dysfunction of cervical region: Secondary | ICD-10-CM | POA: Diagnosis not present

## 2019-04-12 ENCOUNTER — Other Ambulatory Visit: Payer: Self-pay | Admitting: Family Medicine

## 2019-04-16 ENCOUNTER — Ambulatory Visit: Payer: Medicare Other | Admitting: Psychology

## 2019-04-18 ENCOUNTER — Other Ambulatory Visit: Payer: Self-pay | Admitting: Family Medicine

## 2019-04-18 NOTE — Telephone Encounter (Signed)
Looks like this medication was discontinued at discharge.

## 2019-04-19 NOTE — Telephone Encounter (Signed)
Please confirm with the patient that she is continuing to take this medication. Thanks.

## 2019-04-22 NOTE — Telephone Encounter (Signed)
Noted.  I will refill at this dosing.

## 2019-04-22 NOTE — Telephone Encounter (Signed)
Spoke with pt and she stated that she is taking metoprolol 50mg  BID.

## 2019-04-23 DIAGNOSIS — M9903 Segmental and somatic dysfunction of lumbar region: Secondary | ICD-10-CM | POA: Diagnosis not present

## 2019-04-23 DIAGNOSIS — M9901 Segmental and somatic dysfunction of cervical region: Secondary | ICD-10-CM | POA: Diagnosis not present

## 2019-04-23 DIAGNOSIS — M5416 Radiculopathy, lumbar region: Secondary | ICD-10-CM | POA: Diagnosis not present

## 2019-04-23 DIAGNOSIS — M5033 Other cervical disc degeneration, cervicothoracic region: Secondary | ICD-10-CM | POA: Diagnosis not present

## 2019-04-24 ENCOUNTER — Other Ambulatory Visit: Payer: Self-pay

## 2019-04-24 ENCOUNTER — Telehealth: Payer: Self-pay | Admitting: Family Medicine

## 2019-04-24 ENCOUNTER — Ambulatory Visit (INDEPENDENT_AMBULATORY_CARE_PROVIDER_SITE_OTHER): Payer: Medicare Other | Admitting: Family Medicine

## 2019-04-24 ENCOUNTER — Encounter: Payer: Self-pay | Admitting: Family Medicine

## 2019-04-24 DIAGNOSIS — M79643 Pain in unspecified hand: Secondary | ICD-10-CM | POA: Diagnosis not present

## 2019-04-24 DIAGNOSIS — E7800 Pure hypercholesterolemia, unspecified: Secondary | ICD-10-CM | POA: Diagnosis not present

## 2019-04-24 DIAGNOSIS — J309 Allergic rhinitis, unspecified: Secondary | ICD-10-CM | POA: Diagnosis not present

## 2019-04-24 DIAGNOSIS — F4323 Adjustment disorder with mixed anxiety and depressed mood: Secondary | ICD-10-CM

## 2019-04-24 DIAGNOSIS — J449 Chronic obstructive pulmonary disease, unspecified: Secondary | ICD-10-CM

## 2019-04-24 NOTE — Assessment & Plan Note (Signed)
I discussed that this is most likely osteoarthritis.  She can continue with her current regimen.  NSAIDs are not an option given her kidney function.

## 2019-04-24 NOTE — Assessment & Plan Note (Signed)
Much improved.  She will continue with Lexapro and Wellbutrin.

## 2019-04-24 NOTE — Assessment & Plan Note (Signed)
Much improved.  She will continue Singulair.

## 2019-04-24 NOTE — Progress Notes (Signed)
Virtual Visit via video Note  This visit type was conducted due to national recommendations for restrictions regarding the COVID-19 pandemic (e.g. social distancing).  This format is felt to be most appropriate for this patient at this time.  All issues noted in this document were discussed and addressed.  No physical exam was performed (except for noted visual exam findings with Video Visits).   I connected with Joesph July today at 10:30 AM EDT by a video enabled telemedicine application and verified that I am speaking with the correct person using two identifiers. Location patient: home Location provider: work Persons participating in the virtual visit: patient, provider  I discussed the limitations, risks, security and privacy concerns of performing an evaluation and management service by telephone and the availability of in person appointments. I also discussed with the patient that there may be a patient responsible charge related to this service. The patient expressed understanding and agreed to proceed.  Reason for visit: Follow-up.  HPI: COPD: Patient notes she is doing well with this.  She has had no cough or wheezing.  She rarely has some dyspnea when she is out and the pollen is bad.  This is unchanged from previously.  She is using Breo.  She rarely uses her albuterol.  Hyperlipidemia: No chest pain, right upper quadrant pain, or myalgias.  She is taking Lipitor and Zetia.  Allergic rhinitis: She notes no allergy symptoms with addition of Singulair.  Depression: She notes no depressive symptoms with change to Lexapro.  She continues on Wellbutrin.  No SI.  She is no longer doing counseling.  Bilateral hand pain: She notes her PIP joints in both of her hands bother her at times.  She notes her chiropractor advised that this could be related to her neck and possible nerve impingement given that there are nerves that go down into her hands.  The discomfort is focal in those PIP  joints.  She uses copper gloves and lidocaine cream with good benefit.  She is trying to change her diet to cut chemicals out of her diet to see if that will help as well.   ROS: See pertinent positives and negatives per HPI.  Past Medical History:  Diagnosis Date  . Arrhythmia   . Chicken pox   . COPD (chronic obstructive pulmonary disease) ([place])   . Coronary artery disease    NSTEMI in 09/2008. Cath : 80% RCA and 95% first diagonal. PCI and 2 DES placement  RCA and 1 DES to diagonal. Complicated by acute diagonal stent thrombosis . Treated by thrombectomy. Most recent nuclear stress test in 2010 showed no ischemia with normal EF.   Marland Kitchen Depression   . GERD (gastroesophageal reflux disease)   . Hay fever   . History of blood transfusion   . History of hypercholesterolemia   . History of syncope 2000   negative EP study  . Hyperlipidemia    intolerance to statins except Crestor.   . Hypertension   . Hypothyroid   . MI (myocardial infarction) ([place])   . Pneumonia, organism unspecified(486)   . Sepsis ([place]) 04/27/2018    Past Surgical History:  Procedure Laterality Date  . CORONARY ANGIOPLASTY WITH STENT PLACEMENT  2009   [facility], drug-eluting mid RCA,prox diag;  . TONSILLECTOMY    . TUBAL LIGATION      Family History  Problem Relation Age of Onset  . Colon cancer Father   . Lung cancer Father  was a former smoker  . Cancer Father        lung  . Diabetes Father   . Hypertension Mother   . Hypertension Other   . Diabetes Other     SOCIAL HX: Smoker.   Current Outpatient Medications:  .  albuterol (PROAIR HFA) 108 (90 Base) MCG/ACT inhaler, Inhale 1-2 puffs into the lungs every 6 (six) hours as needed for wheezing or shortness of breath., Disp: 1 Inhaler, Rfl: 5 .  aspirin 81 MG tablet, Take 81 mg by mouth daily., Disp: , Rfl:  .  atorvastatin (LIPITOR) 80 MG tablet, TAKE 1 TABLET(80 MG) BY MOUTH DAILY, Disp: 90 tablet, Rfl: 0 .  budesonide-formoterol  (SYMBICORT) 80-4.5 MCG/ACT inhaler, Inhale 2 puffs into the lungs daily., Disp: 1 Inhaler, Rfl: 5 .  buPROPion (WELLBUTRIN XL) 150 MG 24 hr tablet, TAKE 1 TABLET(150 MG) BY MOUTH DAILY, Disp: 90 tablet, Rfl: 0 .  carisoprodol (SOMA) 350 MG tablet, TAKE 1 TABLET BY MOUTH TWICE DAILY AS NEEDED FOR MUSCLE SPASMS, Disp: 60 tablet, Rfl: 0 .  Cholecalciferol (VITAMIN D-3) 1000 UNITS CAPS, Take 1 capsule by mouth daily., Disp: , Rfl:  .  citalopram (CELEXA) 40 MG tablet, TAKE 1 TABLET(40 MG) BY MOUTH DAILY, Disp: 90 tablet, Rfl: 0 .  Coenzyme Q10 (COQ10) 400 MG CAPS, Take 400 mg by mouth daily., Disp: , Rfl:  .  escitalopram (LEXAPRO) 10 MG tablet, Take 1 tablet (10 mg total) by mouth daily., Disp: 30 tablet, Rfl: 2 .  ezetimibe (ZETIA) 10 MG tablet, Take 1 tablet (10 mg total) by mouth daily., Disp: 90 tablet, Rfl: 3 .  fluticasone (FLONASE) 50 MCG/ACT nasal spray, Place 2 sprays into both nostrils daily., Disp: 16 g, Rfl: 6 .  glucosamine-chondroitin 500-400 MG tablet, Take 1 tablet by mouth 2 (two) times daily., Disp: , Rfl:  .  levothyroxine (SYNTHROID, LEVOTHROID) 100 MCG tablet, TAKE 1 TABLET(100 MCG) BY MOUTH DAILY BEFORE BREAKFAST, Disp: 90 tablet, Rfl: 1 .  metoprolol tartrate (LOPRESSOR) 50 MG tablet, Take 1 tablet (50 mg total) by mouth 2 (two) times daily., Disp: 180 tablet, Rfl: 1 .  montelukast (SINGULAIR) 10 MG tablet, Take 1 tablet (10 mg total) by mouth at bedtime., Disp: 30 tablet, Rfl: 3 .  Multiple Vitamin (MULTIVITAMIN) capsule, Take 1 capsule by mouth daily., Disp: , Rfl:  .  mupirocin ointment (BACTROBAN) 2 %, Apply 1 application topically 2 (two) times daily as needed., Disp: 22 g, Rfl: 0 .  nitroGLYCERIN (NITROSTAT) 0.4 MG SL tablet, Place 1 tablet (0.4 mg total) under the tongue every 5 (five) minutes as needed., Disp: 25 tablet, Rfl: 3 .  omeprazole (PRILOSEC) 20 MG capsule, TAKE 1 CAPSULE BY MOUTH EVERY DAY, Disp: 90 capsule, Rfl: 0 .  Psyllium (METAMUCIL FIBER PO), Take 1  Package by mouth daily as needed., Disp: , Rfl:  .  sertraline (ZOLOFT) 50 MG tablet, Take 50 mg (1 tablet) by mouth daily for 7 days, then take 100 mg (2 tablets) by mouth daily, Disp: 60 tablet, Rfl: 3 .  metoprolol tartrate (LOPRESSOR) 25 MG tablet, Take 1 tablet (25 mg total) by mouth 2 (two) times daily. (Patient taking differently: Take 50 mg by mouth daily. ), Disp: 60 tablet, Rfl: 0  EXAM:  VITALS per patient if applicable: None.    GENERAL: alert, oriented, appears well and in no acute distress  HEENT: atraumatic, conjunttiva clear, no obvious abnormalities on inspection of external nose and ears  NECK: normal movements of  the head and neck  LUNGS: on inspection no signs of respiratory distress, breathing rate appears normal, no obvious gross SOB, gasping or wheezing  CV: no obvious cyanosis  MS: moves all visible extremities without noticeable abnormality, no apparent joint swelling in her PIP joints in her left hand  PSYCH/NEURO: pleasant and cooperative, no obvious depression or anxiety, speech and thought processing grossly intact  ASSESSMENT AND PLAN:  Discussed the following assessment and plan:  Pain of hand, unspecified laterality  Hypercholesterolemia - Plan: Lipid panel, Comp Met ([facility])  Adjustment disorder with mixed anxiety and depressed mood  Allergic rhinitis, unspecified seasonality, unspecified trigger  COPD (chronic obstructive pulmonary disease) with chronic bronchitis (HCC)  Hand pain I discussed that this is most likely osteoarthritis.  She can continue with her current regimen.  NSAIDs are not an option given her kidney function.  Hypercholesterolemia She will continue with her current medication.  We will have her come in for a lipid panel.  Adjustment disorder with mixed anxiety and depressed mood Much improved.  She will continue with Lexapro and Wellbutrin.  Allergic rhinitis Much improved.  She will continue Singulair.  COPD (chronic  obstructive pulmonary disease) with chronic bronchitis ([facility]CC) Stable.  She will continue with her current medication regimen.  CMA will contact the patient and get her scheduled for follow-up in 6 months and lab work in the next several weeks.  Social distancing precautions and sick precautions given regarding COVID-19.   I discussed the assessment and treatment plan with the patient. The patient was provided an opportunity to ask questions and all were answered. The patient agreed with the plan and demonstrated an understanding of the instructions.   The patient was advised to call back or seek an in-person evaluation if the symptoms worsen or if the condition fails to improve as anticipated.    [name], MD

## 2019-04-24 NOTE — Assessment & Plan Note (Signed)
She will continue with her current medication.  We will have her come in for a lipid panel.

## 2019-04-24 NOTE — Telephone Encounter (Signed)
Called and spoke with pt. Pt scheduled for lab appt and OV.

## 2019-04-24 NOTE — Assessment & Plan Note (Signed)
Stable.  She will continue with her current medication regimen.

## 2019-04-24 NOTE — Telephone Encounter (Signed)
Please call the patient and get her scheduled for 6 month follow-up with me an lab work in the next several weeks.

## 2019-04-25 ENCOUNTER — Other Ambulatory Visit: Payer: Self-pay

## 2019-04-26 DIAGNOSIS — M9901 Segmental and somatic dysfunction of cervical region: Secondary | ICD-10-CM | POA: Diagnosis not present

## 2019-04-26 DIAGNOSIS — M5033 Other cervical disc degeneration, cervicothoracic region: Secondary | ICD-10-CM | POA: Diagnosis not present

## 2019-04-26 DIAGNOSIS — M5416 Radiculopathy, lumbar region: Secondary | ICD-10-CM | POA: Diagnosis not present

## 2019-04-26 DIAGNOSIS — M9903 Segmental and somatic dysfunction of lumbar region: Secondary | ICD-10-CM | POA: Diagnosis not present

## 2019-05-01 DIAGNOSIS — M5416 Radiculopathy, lumbar region: Secondary | ICD-10-CM | POA: Diagnosis not present

## 2019-05-01 DIAGNOSIS — M9901 Segmental and somatic dysfunction of cervical region: Secondary | ICD-10-CM | POA: Diagnosis not present

## 2019-05-01 DIAGNOSIS — M5033 Other cervical disc degeneration, cervicothoracic region: Secondary | ICD-10-CM | POA: Diagnosis not present

## 2019-05-01 DIAGNOSIS — M9903 Segmental and somatic dysfunction of lumbar region: Secondary | ICD-10-CM | POA: Diagnosis not present

## 2019-05-06 DIAGNOSIS — N390 Urinary tract infection, site not specified: Secondary | ICD-10-CM | POA: Diagnosis not present

## 2019-05-06 DIAGNOSIS — M5416 Radiculopathy, lumbar region: Secondary | ICD-10-CM | POA: Diagnosis not present

## 2019-05-06 DIAGNOSIS — N183 Chronic kidney disease, stage 3 (moderate): Secondary | ICD-10-CM | POA: Diagnosis not present

## 2019-05-06 DIAGNOSIS — E871 Hypo-osmolality and hyponatremia: Secondary | ICD-10-CM | POA: Diagnosis not present

## 2019-05-06 DIAGNOSIS — E872 Acidosis: Secondary | ICD-10-CM | POA: Diagnosis not present

## 2019-05-06 DIAGNOSIS — M9901 Segmental and somatic dysfunction of cervical region: Secondary | ICD-10-CM | POA: Diagnosis not present

## 2019-05-06 DIAGNOSIS — I10 Essential (primary) hypertension: Secondary | ICD-10-CM | POA: Diagnosis not present

## 2019-05-06 DIAGNOSIS — N179 Acute kidney failure, unspecified: Secondary | ICD-10-CM | POA: Diagnosis not present

## 2019-05-06 DIAGNOSIS — M9903 Segmental and somatic dysfunction of lumbar region: Secondary | ICD-10-CM | POA: Diagnosis not present

## 2019-05-06 DIAGNOSIS — M5033 Other cervical disc degeneration, cervicothoracic region: Secondary | ICD-10-CM | POA: Diagnosis not present

## 2019-05-09 DIAGNOSIS — N183 Chronic kidney disease, stage 3 (moderate): Secondary | ICD-10-CM | POA: Diagnosis not present

## 2019-05-09 DIAGNOSIS — I129 Hypertensive chronic kidney disease with stage 1 through stage 4 chronic kidney disease, or unspecified chronic kidney disease: Secondary | ICD-10-CM | POA: Diagnosis not present

## 2019-05-12 ENCOUNTER — Other Ambulatory Visit: Payer: Self-pay | Admitting: Family Medicine

## 2019-05-13 ENCOUNTER — Other Ambulatory Visit: Payer: Self-pay | Admitting: Family Medicine

## 2019-05-14 DIAGNOSIS — M9901 Segmental and somatic dysfunction of cervical region: Secondary | ICD-10-CM | POA: Diagnosis not present

## 2019-05-14 DIAGNOSIS — M5033 Other cervical disc degeneration, cervicothoracic region: Secondary | ICD-10-CM | POA: Diagnosis not present

## 2019-05-14 DIAGNOSIS — M5416 Radiculopathy, lumbar region: Secondary | ICD-10-CM | POA: Diagnosis not present

## 2019-05-14 DIAGNOSIS — M9903 Segmental and somatic dysfunction of lumbar region: Secondary | ICD-10-CM | POA: Diagnosis not present

## 2019-05-16 ENCOUNTER — Telehealth: Payer: Self-pay | Admitting: Family Medicine

## 2019-05-23 ENCOUNTER — Other Ambulatory Visit: Payer: Self-pay | Admitting: Family Medicine

## 2019-05-23 NOTE — Telephone Encounter (Signed)
Request for refill on carisoprodol (SOMA) 350 MG tablet sent on 05/13/2019 and 05/16/2019 please advise

## 2019-05-24 ENCOUNTER — Other Ambulatory Visit: Payer: Self-pay

## 2019-05-24 NOTE — Telephone Encounter (Signed)
Medication refilled by Philis [facility].  [email or web address]

## 2019-05-26 ENCOUNTER — Other Ambulatory Visit: Payer: Self-pay | Admitting: Pulmonary Disease

## 2019-05-26 ENCOUNTER — Other Ambulatory Visit: Payer: Self-pay | Admitting: Family Medicine

## 2019-05-27 NOTE — Telephone Encounter (Signed)
Pt returning call a/b request for breo, please adviase she can be reached @ [phone].Hillery Hunter

## 2019-05-27 NOTE — Telephone Encounter (Signed)
Pt states she did not start Symbicort. Dr.Sonnerberg changed her sinus med and [facility] has been working well. Made patient aware if BQ not back in office by August she will need appt with app. Pt verbalized understanding.

## 2019-05-27 NOTE — Telephone Encounter (Signed)
Breo was d/c from med list Left message for patient to call back.

## 2019-05-30 DIAGNOSIS — E871 Hypo-osmolality and hyponatremia: Secondary | ICD-10-CM | POA: Diagnosis not present

## 2019-05-30 DIAGNOSIS — N390 Urinary tract infection, site not specified: Secondary | ICD-10-CM | POA: Diagnosis not present

## 2019-05-30 DIAGNOSIS — N179 Acute kidney failure, unspecified: Secondary | ICD-10-CM | POA: Diagnosis not present

## 2019-05-30 DIAGNOSIS — N183 Chronic kidney disease, stage 3 (moderate): Secondary | ICD-10-CM | POA: Diagnosis not present

## 2019-05-30 DIAGNOSIS — I10 Essential (primary) hypertension: Secondary | ICD-10-CM | POA: Diagnosis not present

## 2019-06-03 ENCOUNTER — Other Ambulatory Visit: Payer: Self-pay | Admitting: Family Medicine

## 2019-06-03 DIAGNOSIS — M9903 Segmental and somatic dysfunction of lumbar region: Secondary | ICD-10-CM | POA: Diagnosis not present

## 2019-06-03 DIAGNOSIS — M9901 Segmental and somatic dysfunction of cervical region: Secondary | ICD-10-CM | POA: Diagnosis not present

## 2019-06-03 DIAGNOSIS — M5033 Other cervical disc degeneration, cervicothoracic region: Secondary | ICD-10-CM | POA: Diagnosis not present

## 2019-06-03 DIAGNOSIS — Z1231 Encounter for screening mammogram for malignant neoplasm of breast: Secondary | ICD-10-CM

## 2019-06-03 DIAGNOSIS — M5416 Radiculopathy, lumbar region: Secondary | ICD-10-CM | POA: Diagnosis not present

## 2019-06-04 ENCOUNTER — Other Ambulatory Visit: Payer: Medicare Other

## 2019-06-12 ENCOUNTER — Ambulatory Visit: Payer: Self-pay | Admitting: Family Medicine

## 2019-06-15 ENCOUNTER — Other Ambulatory Visit: Payer: Self-pay | Admitting: Family Medicine

## 2019-06-25 DIAGNOSIS — M5416 Radiculopathy, lumbar region: Secondary | ICD-10-CM | POA: Diagnosis not present

## 2019-06-25 DIAGNOSIS — M5033 Other cervical disc degeneration, cervicothoracic region: Secondary | ICD-10-CM | POA: Diagnosis not present

## 2019-06-25 DIAGNOSIS — M9903 Segmental and somatic dysfunction of lumbar region: Secondary | ICD-10-CM | POA: Diagnosis not present

## 2019-06-25 DIAGNOSIS — M9901 Segmental and somatic dysfunction of cervical region: Secondary | ICD-10-CM | POA: Diagnosis not present

## 2019-06-27 ENCOUNTER — Other Ambulatory Visit: Payer: Self-pay | Admitting: Family Medicine

## 2019-07-03 ENCOUNTER — Other Ambulatory Visit: Payer: Self-pay | Admitting: Family Medicine

## 2019-07-03 NOTE — Telephone Encounter (Signed)
Refilled: 05/23/2019 Last OV: 04/24/2019 Next OV: 10/23/2019

## 2019-07-09 ENCOUNTER — Other Ambulatory Visit: Payer: Self-pay | Admitting: Family Medicine

## 2019-07-15 DIAGNOSIS — M5033 Other cervical disc degeneration, cervicothoracic region: Secondary | ICD-10-CM | POA: Diagnosis not present

## 2019-07-15 DIAGNOSIS — M9903 Segmental and somatic dysfunction of lumbar region: Secondary | ICD-10-CM | POA: Diagnosis not present

## 2019-07-15 DIAGNOSIS — M9901 Segmental and somatic dysfunction of cervical region: Secondary | ICD-10-CM | POA: Diagnosis not present

## 2019-07-15 DIAGNOSIS — M5416 Radiculopathy, lumbar region: Secondary | ICD-10-CM | POA: Diagnosis not present

## 2019-07-15 IMAGING — CR DG CHEST 2V
1 series · 2 of 2 positions shown · non-contrast
Comparison: July 21, 2016

CLINICAL DATA: Several recent falls.  Weakness and nausea

EXAM:
CHEST - 2 VIEW

[Series 1: dg chest 2 view · 0.14mm/px · 2 of 2 slices shown]
[im 1/2]
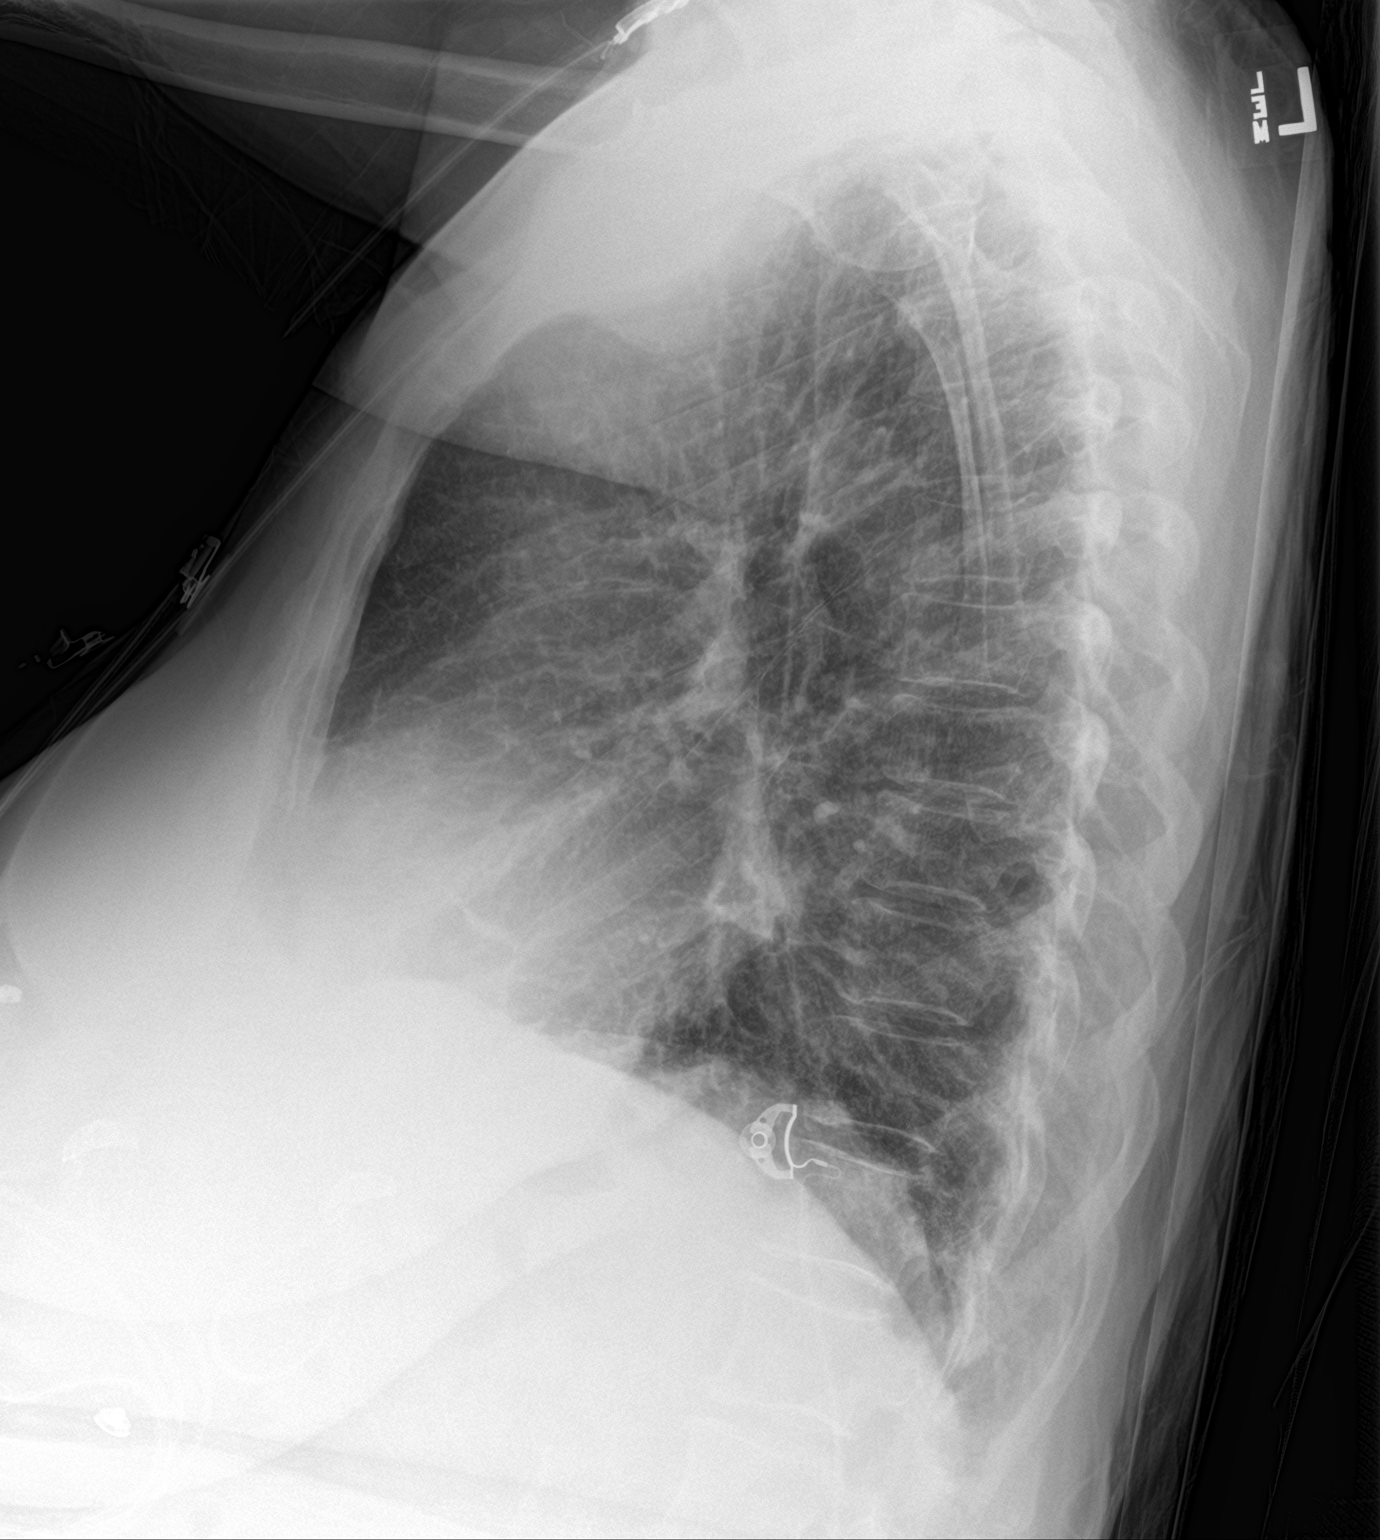
[im 2/2]
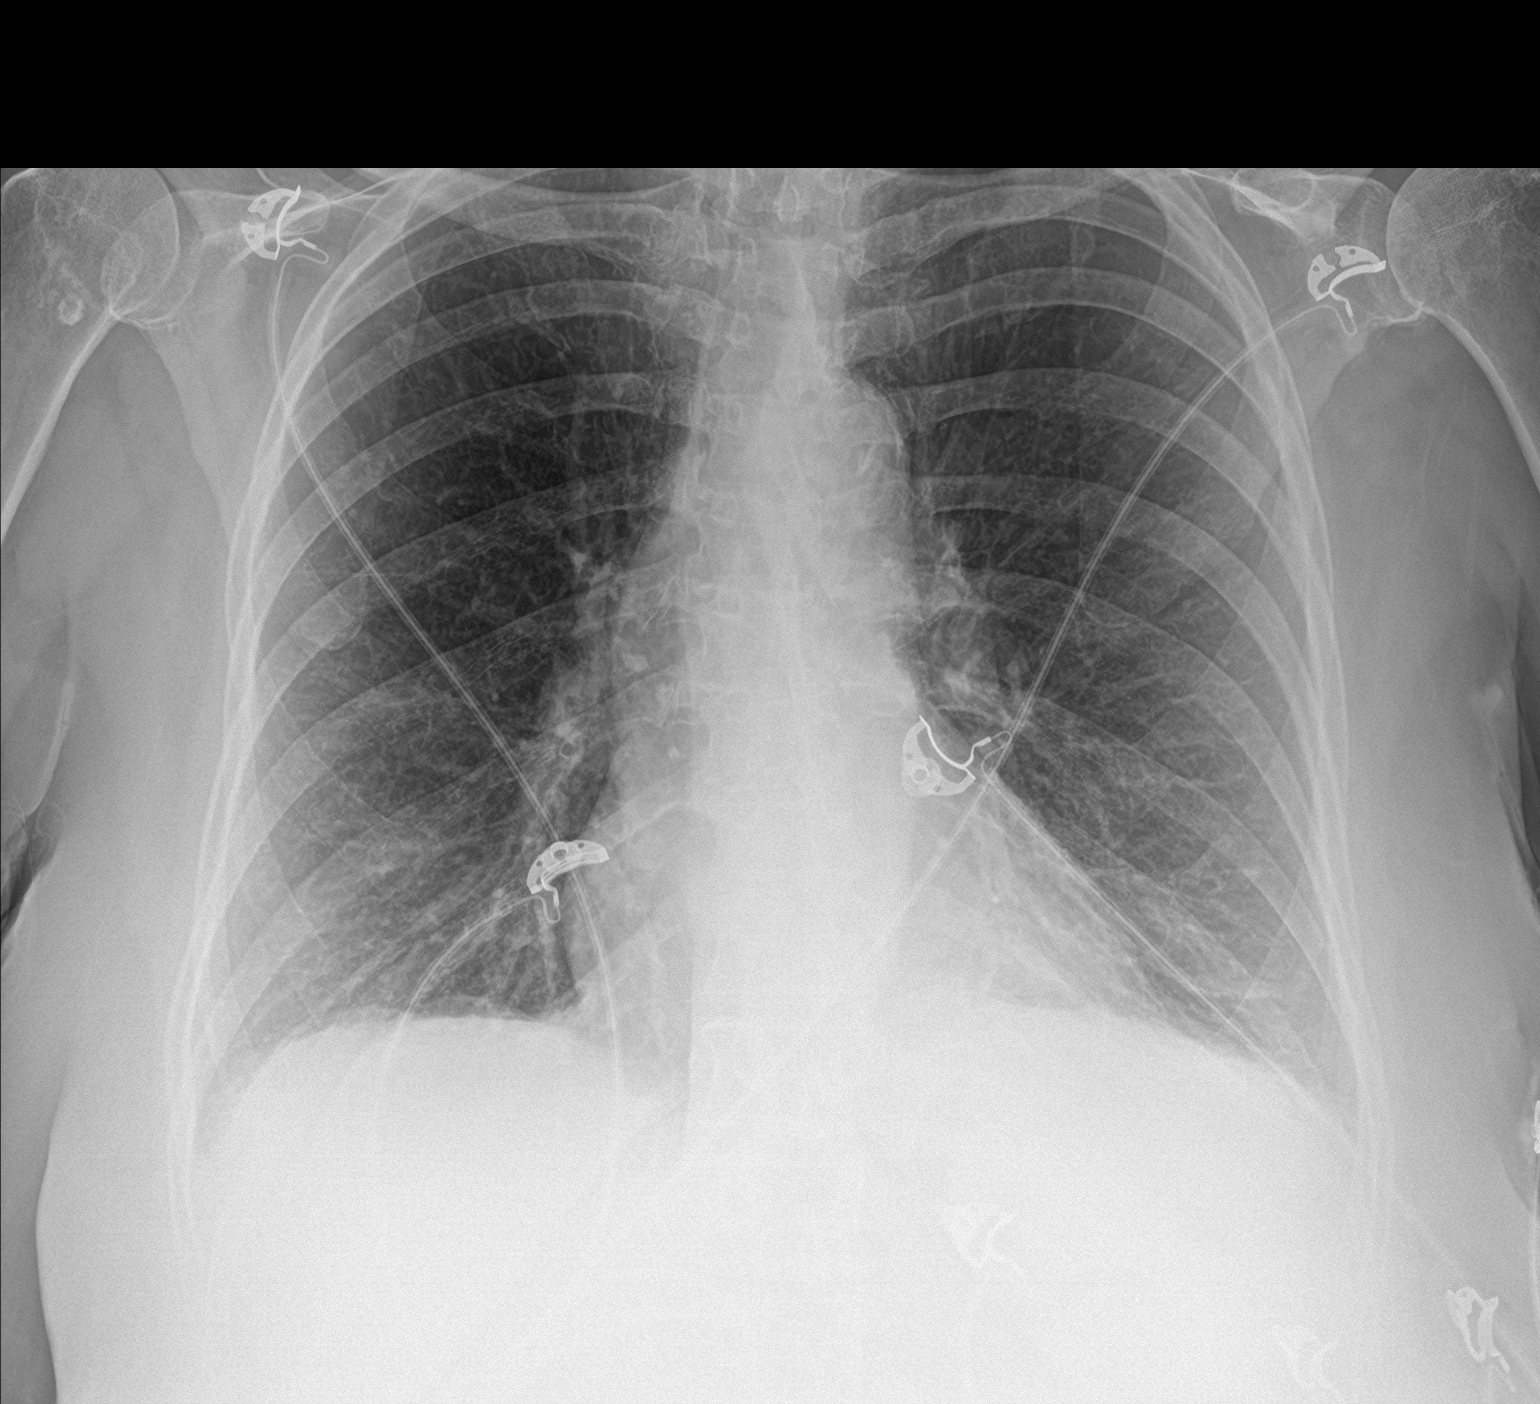

[2 of 2 positions shown; findings below may reference images not displayed]

FINDINGS: There is no edema or consolidation. The heart size and pulmonary
vascularity are normal. No adenopathy. There is aortic
atherosclerosis. No pneumothorax. No evident bone lesions.
IMPRESSION: Aortic atherosclerosis.  No edema or consolidation.

Aortic Atherosclerosis (EG7H3-DBC.C).

## 2019-07-16 DIAGNOSIS — D225 Melanocytic nevi of trunk: Secondary | ICD-10-CM | POA: Diagnosis not present

## 2019-07-16 DIAGNOSIS — Z1283 Encounter for screening for malignant neoplasm of skin: Secondary | ICD-10-CM | POA: Diagnosis not present

## 2019-07-16 DIAGNOSIS — L812 Freckles: Secondary | ICD-10-CM | POA: Diagnosis not present

## 2019-07-16 DIAGNOSIS — Z1389 Encounter for screening for other disorder: Secondary | ICD-10-CM | POA: Diagnosis not present

## 2019-07-16 DIAGNOSIS — L853 Xerosis cutis: Secondary | ICD-10-CM | POA: Diagnosis not present

## 2019-07-16 DIAGNOSIS — L821 Other seborrheic keratosis: Secondary | ICD-10-CM | POA: Diagnosis not present

## 2019-07-16 DIAGNOSIS — D1801 Hemangioma of skin and subcutaneous tissue: Secondary | ICD-10-CM | POA: Diagnosis not present

## 2019-08-04 ENCOUNTER — Other Ambulatory Visit: Payer: Self-pay | Admitting: Family Medicine

## 2019-08-06 DIAGNOSIS — M5033 Other cervical disc degeneration, cervicothoracic region: Secondary | ICD-10-CM | POA: Diagnosis not present

## 2019-08-06 DIAGNOSIS — Z23 Encounter for immunization: Secondary | ICD-10-CM | POA: Diagnosis not present

## 2019-08-06 DIAGNOSIS — M5416 Radiculopathy, lumbar region: Secondary | ICD-10-CM | POA: Diagnosis not present

## 2019-08-06 DIAGNOSIS — M9901 Segmental and somatic dysfunction of cervical region: Secondary | ICD-10-CM | POA: Diagnosis not present

## 2019-08-06 DIAGNOSIS — M9903 Segmental and somatic dysfunction of lumbar region: Secondary | ICD-10-CM | POA: Diagnosis not present

## 2019-08-12 ENCOUNTER — Other Ambulatory Visit: Payer: Self-pay | Admitting: Family Medicine

## 2019-08-13 ENCOUNTER — Ambulatory Visit
Admission: RE | Admit: 2019-08-13 | Discharge: 2019-08-13 | Disposition: A | Discharge status: Home / Self Care | Payer: Medicare Other | Source: Ambulatory Visit | Attending: Family Medicine | Admitting: Family Medicine

## 2019-08-13 DIAGNOSIS — Z1231 Encounter for screening mammogram for malignant neoplasm of breast: Secondary | ICD-10-CM | POA: Diagnosis not present

## 2019-08-20 ENCOUNTER — Other Ambulatory Visit: Payer: Self-pay | Admitting: Family Medicine

## 2019-08-21 DIAGNOSIS — M5416 Radiculopathy, lumbar region: Secondary | ICD-10-CM | POA: Diagnosis not present

## 2019-08-21 DIAGNOSIS — M9901 Segmental and somatic dysfunction of cervical region: Secondary | ICD-10-CM | POA: Diagnosis not present

## 2019-08-21 DIAGNOSIS — M9903 Segmental and somatic dysfunction of lumbar region: Secondary | ICD-10-CM | POA: Diagnosis not present

## 2019-08-21 DIAGNOSIS — M5033 Other cervical disc degeneration, cervicothoracic region: Secondary | ICD-10-CM | POA: Diagnosis not present

## 2019-08-21 NOTE — Telephone Encounter (Signed)
Refilled: 07/03/2019 Last OV: 04/24/2019 Next OV: 11/05/2019

## 2019-08-28 ENCOUNTER — Other Ambulatory Visit: Payer: Self-pay

## 2019-08-28 ENCOUNTER — Other Ambulatory Visit: Payer: Self-pay | Admitting: Family Medicine

## 2019-09-08 ENCOUNTER — Other Ambulatory Visit: Payer: Self-pay | Admitting: Family Medicine

## 2019-09-10 ENCOUNTER — Other Ambulatory Visit: Payer: Self-pay

## 2019-09-10 DIAGNOSIS — M9903 Segmental and somatic dysfunction of lumbar region: Secondary | ICD-10-CM | POA: Diagnosis not present

## 2019-09-10 DIAGNOSIS — M9901 Segmental and somatic dysfunction of cervical region: Secondary | ICD-10-CM | POA: Diagnosis not present

## 2019-09-10 DIAGNOSIS — Z20822 Contact with and (suspected) exposure to covid-19: Secondary | ICD-10-CM

## 2019-09-10 DIAGNOSIS — Z20828 Contact with and (suspected) exposure to other viral communicable diseases: Secondary | ICD-10-CM | POA: Diagnosis not present

## 2019-09-10 DIAGNOSIS — M5416 Radiculopathy, lumbar region: Secondary | ICD-10-CM | POA: Diagnosis not present

## 2019-09-10 DIAGNOSIS — M5033 Other cervical disc degeneration, cervicothoracic region: Secondary | ICD-10-CM | POA: Diagnosis not present

## 2019-09-12 DIAGNOSIS — M5033 Other cervical disc degeneration, cervicothoracic region: Secondary | ICD-10-CM | POA: Diagnosis not present

## 2019-09-12 DIAGNOSIS — M9903 Segmental and somatic dysfunction of lumbar region: Secondary | ICD-10-CM | POA: Diagnosis not present

## 2019-09-12 DIAGNOSIS — M5416 Radiculopathy, lumbar region: Secondary | ICD-10-CM | POA: Diagnosis not present

## 2019-09-12 DIAGNOSIS — M9901 Segmental and somatic dysfunction of cervical region: Secondary | ICD-10-CM | POA: Diagnosis not present

## 2019-09-12 LAB — NOVEL CORONAVIRUS, NAA: SARS-CoV-2, NAA: NOT DETECTED

## 2019-09-30 ENCOUNTER — Other Ambulatory Visit: Payer: Self-pay | Admitting: Family Medicine

## 2019-10-02 ENCOUNTER — Other Ambulatory Visit: Payer: Self-pay | Admitting: Family Medicine

## 2019-10-08 DIAGNOSIS — M5033 Other cervical disc degeneration, cervicothoracic region: Secondary | ICD-10-CM | POA: Diagnosis not present

## 2019-10-08 DIAGNOSIS — M9903 Segmental and somatic dysfunction of lumbar region: Secondary | ICD-10-CM | POA: Diagnosis not present

## 2019-10-08 DIAGNOSIS — M5416 Radiculopathy, lumbar region: Secondary | ICD-10-CM | POA: Diagnosis not present

## 2019-10-08 DIAGNOSIS — M9901 Segmental and somatic dysfunction of cervical region: Secondary | ICD-10-CM | POA: Diagnosis not present

## 2019-10-16 ENCOUNTER — Other Ambulatory Visit: Payer: Medicare Other

## 2019-10-21 DIAGNOSIS — E872 Acidosis: Secondary | ICD-10-CM | POA: Diagnosis not present

## 2019-10-21 DIAGNOSIS — N183 Chronic kidney disease, stage 3 unspecified: Secondary | ICD-10-CM | POA: Diagnosis not present

## 2019-10-21 DIAGNOSIS — N390 Urinary tract infection, site not specified: Secondary | ICD-10-CM | POA: Diagnosis not present

## 2019-10-21 DIAGNOSIS — I10 Essential (primary) hypertension: Secondary | ICD-10-CM | POA: Diagnosis not present

## 2019-10-21 DIAGNOSIS — N179 Acute kidney failure, unspecified: Secondary | ICD-10-CM | POA: Diagnosis not present

## 2019-10-21 DIAGNOSIS — E871 Hypo-osmolality and hyponatremia: Secondary | ICD-10-CM | POA: Diagnosis not present

## 2019-10-22 ENCOUNTER — Ambulatory Visit (INDEPENDENT_AMBULATORY_CARE_PROVIDER_SITE_OTHER): Payer: Medicare Other | Admitting: Cardiovascular Disease

## 2019-10-22 ENCOUNTER — Other Ambulatory Visit: Payer: Self-pay

## 2019-10-22 ENCOUNTER — Encounter: Payer: Self-pay | Admitting: Cardiovascular Disease

## 2019-10-22 VITALS — BP 124/80 | HR 52 | Temp 97.2°F | Ht 65.0 in | Wt 159.0 lb

## 2019-10-22 DIAGNOSIS — Z720 Tobacco use: Secondary | ICD-10-CM

## 2019-10-22 DIAGNOSIS — I10 Essential (primary) hypertension: Secondary | ICD-10-CM

## 2019-10-22 DIAGNOSIS — I2510 Atherosclerotic heart disease of native coronary artery without angina pectoris: Secondary | ICD-10-CM | POA: Diagnosis not present

## 2019-10-22 DIAGNOSIS — E782 Mixed hyperlipidemia: Secondary | ICD-10-CM | POA: Diagnosis not present

## 2019-10-22 DIAGNOSIS — J449 Chronic obstructive pulmonary disease, unspecified: Secondary | ICD-10-CM | POA: Diagnosis not present

## 2019-10-22 DIAGNOSIS — R002 Palpitations: Secondary | ICD-10-CM

## 2019-10-22 MED ORDER — METOPROLOL TARTRATE 25 MG PO TABS: 25.0000 mg | ORAL_TABLET | Freq: Two times a day (BID) | ORAL | 11 refills | Status: DC

## 2019-10-22 MED ORDER — NITROGLYCERIN 0.4 MG SL SUBL: 0.4000 mg | SUBLINGUAL_TABLET | SUBLINGUAL | 3 refills | Status: DC | PRN

## 2019-10-22 NOTE — Patient Instructions (Signed)

## 2019-10-22 NOTE — Progress Notes (Signed)
E 

## 2019-10-22 NOTE — Progress Notes (Signed)
Cardiology Office Note    Date:  10/22/2019   ID:  Jodi Beasley, DOB 05-Mar-1946, MRN [id number]  PCP:  Leone Haven, MD  Cardiologist:   Sanda Klein, MD   Chief Complaint  Patient presents with  . Follow-up    12 months.    History of Present Illness:  Jodi Beasley is a 73 y.o. female with coronary disease, mild sinus node dysfunction, hypertension, CKD 3 and hyperlipidemia returning for follow-up.  The patient specifically denies any chest pain at rest exertion, dyspnea at rest or with exertion, orthopnea, paroxysmal nocturnal dyspnea, syncope, palpitations, focal neurological deficits, intermittent claudication, lower extremity edema, unexplained weight gain, cough, hemoptysis or wheezing.  Fortunately she continues smoking roughly a pack per day and has no intention or hope that she will ever quit.  She has not had any major health problems this year.  She has routine lab follow-up with Dr. Juleen China her nephrologist [place] and tells me that he told her she is now at roughly 33% of normal kidney function.  She developed itching after he prescribed the drug to protect her kidneys (I presume this was an ARB), but does not remember the exact agent.  The most recent creatinine ICU on file was 1.56 in June 2020, prior to that 1.63 in December 2019  She is planning to relocate to [facility] to live with her sister.  However, she wants to keep all her current physicians in the [place] area.  Roughly 5 years have passed since Jodi Beasley, her longtime partner and also on of our patients, passed away in their home.  She has coronary artery disease. In October 2009 she had a small NSTEMI and received 2 drug-eluting stents (RCA and diagonal artery). Immediately following this she had a small lateral STEMI due to acute diagonal stent thrombosis, treated with thrombectomy and balloon angioplasty. She had a normal nuclear stress test in 2014 and has normal left ventricular systolic  function.  Past Medical History:  Diagnosis Date  . Arrhythmia   . Chicken pox   . COPD (chronic obstructive pulmonary disease) ([place])   . Coronary artery disease    NSTEMI in 09/2008. Cath : 80% RCA and 95% first diagonal. PCI and 2 DES placement  RCA and 1 DES to diagonal. Complicated by acute diagonal stent thrombosis . Treated by thrombectomy. Most recent nuclear stress test in 2010 showed no ischemia with normal EF.   Marland Kitchen Depression   . GERD (gastroesophageal reflux disease)   . Hay fever   . History of blood transfusion   . History of hypercholesterolemia   . History of syncope 2000   negative EP study  . Hyperlipidemia    intolerance to statins except Crestor.   . Hypertension   . Hypothyroid   . MI (myocardial infarction) ([place])   . Pneumonia, organism unspecified(486)   . Sepsis ([place]) 04/27/2018    Past Surgical History:  Procedure Laterality Date  . CORONARY ANGIOPLASTY WITH STENT PLACEMENT  2009   [facility], drug-eluting mid RCA,prox diag;  . TONSILLECTOMY    . TUBAL LIGATION      Current Medications: Outpatient Medications Prior to Visit  Medication Sig Dispense Refill  . aspirin 81 MG tablet Take 81 mg by mouth daily.    Marland Kitchen atorvastatin (LIPITOR) 80 MG tablet TAKE 1 TABLET(80 MG) BY MOUTH DAILY 90 tablet 3  . BREO ELLIPTA 100-25 MCG/INH AEPB INHALE 1 PUFF BY MOUTH EVERY DAY 60 each 3  . buPROPion (WELLBUTRIN XL) 150  MG 24 hr tablet TAKE 1 TABLET(150 MG) BY MOUTH DAILY 90 tablet 0  . carisoprodol (SOMA) 350 MG tablet TAKE 1 TABLET BY MOUTH TWICE DAILY AS NEEDED FOR MUSCLE SPASMS 60 tablet 0  . Cholecalciferol (VITAMIN D-3) 1000 UNITS CAPS Take 1 capsule by mouth daily.    . citalopram (CELEXA) 40 MG tablet TAKE 1 TABLET(40 MG) BY MOUTH DAILY 90 tablet 0  . Coenzyme Q10 (COQ10) 400 MG CAPS Take 400 mg by mouth daily.    Marland Kitchen escitalopram (LEXAPRO) 10 MG tablet TAKE 1 TABLET(10 MG) BY MOUTH DAILY 30 tablet 2  . ezetimibe (ZETIA) 10 MG tablet Take 1 tablet (10 mg  total) by mouth daily. 90 tablet 3  . fluticasone (FLONASE) 50 MCG/ACT nasal spray SHAKE LIQUID AND USE 2 SPRAYS IN EACH NOSTRIL DAILY 16 g 6  . FLUZONE HIGH-DOSE QUADRIVALENT 0.7 ML SUSY ADM 0.7ML IM UTD    . glucosamine-chondroitin 500-400 MG tablet Take 1 tablet by mouth 2 (two) times daily.    Marland Kitchen levothyroxine (SYNTHROID) 100 MCG tablet TAKE 1 TABLET(100 MCG) BY MOUTH DAILY BEFORE BREAKFAST 90 tablet 1  . montelukast (SINGULAIR) 10 MG tablet TAKE 1 TABLET(10 MG) BY MOUTH AT BEDTIME 30 tablet 3  . Multiple Vitamin (MULTIVITAMIN) capsule Take 1 capsule by mouth daily.    . mupirocin ointment (BACTROBAN) 2 % Apply 1 application topically 2 (two) times daily as needed. 22 g 0  . omeprazole (PRILOSEC) 20 MG capsule TAKE 1 CAPSULE BY MOUTH EVERY DAY 90 capsule 0  . Psyllium (METAMUCIL FIBER PO) Take 1 Package by mouth daily as needed.    . sertraline (ZOLOFT) 50 MG tablet Take 50 mg (1 tablet) by mouth daily for 7 days, then take 100 mg (2 tablets) by mouth daily 60 tablet 3  . metoprolol tartrate (LOPRESSOR) 50 MG tablet Take 1 tablet (50 mg total) by mouth 2 (two) times daily. 180 tablet 1  . nitroGLYCERIN (NITROSTAT) 0.4 MG SL tablet Place 1 tablet (0.4 mg total) under the tongue every 5 (five) minutes as needed. 25 tablet 3  . albuterol (PROAIR HFA) 108 (90 Base) MCG/ACT inhaler Inhale 1-2 puffs into the lungs every 6 (six) hours as needed for wheezing or shortness of breath. 1 Inhaler 5  . budesonide-formoterol (SYMBICORT) 80-4.5 MCG/ACT inhaler Inhale 2 puffs into the lungs daily. 1 Inhaler 5   No facility-administered medications prior to visit.      Allergies:   Benzocaine, Darvon [propoxyphene hcl], Monosodium glutamate, Neosporin [neomycin-bacitracin zn-polymyx], Nicoderm [nicotine], and Prednisone   Social History   Socioeconomic History  . Marital status: Widowed    Spouse name: Not on file  . Number of children: Not on file  . Years of education: Not on file  . Highest education  level: Not on file  Occupational History  . Not on file  Social Needs  . Financial resource strain: Not hard at all  . Food insecurity    Worry: Never true    Inability: Never true  . Transportation needs    Medical: No    Non-medical: No  Tobacco Use  . Smoking status: Current Every Day Smoker    Packs/day: 1.00    Years: 53.00    Pack years: 53.00    Types: E-cigarettes, Cigarettes    Start date: 11/19/1960  . Smokeless tobacco: Never Used  . Tobacco comment: down to 0.75ppd 1pk a day 01/30/19  Substance and Sexual Activity  . Alcohol use: Yes    Alcohol/week:  1.0 standard drinks    Types: 1 Standard drinks or equivalent per week  . Drug use: No  . Sexual activity: Yes  Lifestyle  . Physical activity    Days per week: Not on file    Minutes per session: Not on file  . Stress: Not at all  Relationships  . Social Herbalist on phone: Not on file    Gets together: Not on file    Attends religious service: Not on file    Active member of club or organization: Not on file    Attends meetings of clubs or organizations: Not on file    Relationship status: Not on file  Other Topics Concern  . Not on file  Social History Narrative   Lives with mother in [place]. 2 cats 2 dogs in home.   Recently moved from [facility].   Custody of 45month old.     Family History:  The patient's family history includes Cancer in her father; Colon cancer in her father; Diabetes in her father and another family member; Hypertension in her mother and another family member; Lung cancer in her father.   ROS:   Please see the history of present illness.    ROS All other systems are reviewed and are negative.   PHYSICAL EXAM:   VS:  BP 124/80 (BP Location: Left Arm, Patient Position: Sitting, Cuff Size: Normal)   Pulse (!) 52   Temp (!) 97.2 F (36.2 C)   Ht 5\' 5"  (1.651 m)   Wt 159 lb (72.1 kg)   BMI 26.46 kg/m      General: Alert, oriented x3, no distress Head: no evidence  of trauma, PERRL, EOMI, no exophtalmos or lid lag, no myxedema, no xanthelasma; normal ears, nose and oropharynx Neck: normal jugular venous pulsations and no hepatojugular reflux; brisk carotid pulses without delay and no carotid bruits Chest: clear to auscultation, no signs of consolidation by percussion or palpation, normal fremitus, symmetrical and full respiratory excursions Cardiovascular: normal position and quality of the apical impulse, regular rhythm, normal first and second heart sounds, no murmurs, rubs or gallops Abdomen: no tenderness or distention, no masses by palpation, no abnormal pulsatility or arterial bruits, normal bowel sounds, no hepatosplenomegaly Extremities: no clubbing, cyanosis or edema; 2+ radial, ulnar and brachial pulses bilaterally; 2+ right femoral, posterior tibial and dorsalis pedis pulses; 2+ left femoral, posterior tibial and dorsalis pedis pulses; no subclavian or femoral bruits Neurological: grossly nonfocal Psych: Normal mood and affect    Wt Readings from Last 3 Encounters:  10/22/19 159 lb (72.1 kg)  02/21/19 156 lb (70.8 kg)  02/19/19 156 lb 12.8 oz (71.1 kg)      Studies/Labs Reviewed:   EKG:  EKG is ordered today.  Shows sinus bradycardia with a almost 2-second pause that I think is a blocked junctional premature contraction.  There are no repolarization abnormalities and the QT is normal at 448 ms Recent Labs: 11/21/2018: ALT 20; BUN 20; Creatinine, Ser 1.63; Potassium 4.3; Sodium 137; TSH 2.14   Lipid Panel    Component Value Date/Time   CHOL 174 03/28/2018 0838   TRIG 198.0 (H) 03/28/2018 0838   HDL 50.20 03/28/2018 0838   CHOLHDL 3 03/28/2018 0838   VLDL 39.6 03/28/2018 0838   LDLCALC 84 03/28/2018 0838   LDLDIRECT 72.0 12/27/2018 1049    Additional studies/ records that were reviewed today include:  Notes from Dr. Caryl Bis   ASSESSMENT:    1. Essential  hypertension   2. Coronary artery disease involving native coronary  artery of native heart without angina pectoris   3. Mixed hyperlipidemia   4. Palpitations   5. Chronic obstructive pulmonary disease, unspecified COPD type ([place])   6. Tobacco abuse      PLAN:  In order of problems listed above:  1. CAD: She does not have angina at rest or with activity. History of stents in the right coronary artery and diagonal artery in 2009. Normal nuclear stress test in 2014.  Smoking cessation remains her biggest unaddressed risk factor. 2. HTN: Excellent control 3. HLP: LDL is very close to target of less than 70.  Triglycerides remain mildly elevated at 198, but I do not think this justifies adding 1 more medication.  She actually has repeat labs scheduled for Monday with Dr. Caryl Bis 4. Palpitations: Remains mildly bradycardic but felt poorly when we try to wean down her beta-blocker.  The pause on today's ECG was asymptomatic and I suspect it is secondary to a blocked junctional premature beat rather than sinus node pause. 5. COPD: FEV1 in April 2017 was 1.3 L/56% of predicted. Make sure she understands the distinction between the long-acting beta agonist (which should be used schedule twice daily) and her rescue inhaler (albuterol).  6. Smoking: Not interested in smoking cessation planning.    Medication Adjustments/Labs and Tests Ordered: Current medicines are reviewed at length with the patient today.  Concerns regarding medicines are outlined above.  Medication changes, Labs and Tests ordered today are listed in the Patient Instructions below. Patient Instructions  Medication Instructions:  No changes *If you need a refill on your cardiac medications before your next appointment, please call your pharmacy*  Lab Work: None ordered If you have labs (blood work) drawn today and your tests are completely normal, you will receive your results only by: Marland Kitchen MyChart Message (if you have MyChart) OR . A paper copy in the mail If you have any lab test that is  abnormal or we need to change your treatment, we will call you to review the results.  Testing/Procedures: None ordered  Follow-Up: At [facility], you and your health needs are our priority.  As part of our continuing mission to provide you with exceptional heart care, we have created designated Provider Care Teams.  These Care Teams include your primary Cardiologist (physician) and Advanced Practice Providers (APPs -  Physician Assistants and Nurse Practitioners) who all work together to provide you with the care you need, when you need it.  Your next appointment:   12 months  The format for your next appointment:   In Person  Provider:   Sanda Klein, MD      Signed, Sanda Klein, MD  10/22/2019 1:14 PM    [facility] [address], [place], [place]  [address] Phone: [phone]; Fax: [phone]

## 2019-10-23 ENCOUNTER — Ambulatory Visit: Payer: Medicare Other | Admitting: Family Medicine

## 2019-10-24 ENCOUNTER — Other Ambulatory Visit: Payer: Self-pay

## 2019-10-25 DIAGNOSIS — E872 Acidosis: Secondary | ICD-10-CM | POA: Diagnosis not present

## 2019-10-25 DIAGNOSIS — R809 Proteinuria, unspecified: Secondary | ICD-10-CM | POA: Diagnosis not present

## 2019-10-25 DIAGNOSIS — I129 Hypertensive chronic kidney disease with stage 1 through stage 4 chronic kidney disease, or unspecified chronic kidney disease: Secondary | ICD-10-CM | POA: Diagnosis not present

## 2019-10-25 DIAGNOSIS — E871 Hypo-osmolality and hyponatremia: Secondary | ICD-10-CM | POA: Diagnosis not present

## 2019-10-25 DIAGNOSIS — N2581 Secondary hyperparathyroidism of renal origin: Secondary | ICD-10-CM | POA: Diagnosis not present

## 2019-10-25 DIAGNOSIS — N1832 Chronic kidney disease, stage 3b: Secondary | ICD-10-CM | POA: Diagnosis not present

## 2019-10-28 ENCOUNTER — Other Ambulatory Visit (INDEPENDENT_AMBULATORY_CARE_PROVIDER_SITE_OTHER): Payer: Medicare Other

## 2019-10-28 ENCOUNTER — Other Ambulatory Visit: Payer: Self-pay

## 2019-10-28 DIAGNOSIS — E7800 Pure hypercholesterolemia, unspecified: Secondary | ICD-10-CM

## 2019-10-28 LAB — COMPREHENSIVE METABOLIC PANEL
ALT: 16 U/L (ref 0–35)
AST: 21 U/L (ref 0–37)
Albumin: 3.9 g/dL (ref 3.5–5.2)
Alkaline Phosphatase: 77 U/L (ref 39–117)
BUN: 20 mg/dL (ref 6–23)
CO2: 25 mEq/L (ref 19–32)
Calcium: 9.2 mg/dL (ref 8.4–10.5)
Chloride: 104 mEq/L (ref 96–112)
Creatinine, Ser: 1.31 mg/dL — ABNORMAL HIGH (ref 0.40–1.20)
GFR: 39.85 mL/min — ABNORMAL LOW (ref >60.00)
Glucose, Bld: 112 mg/dL — ABNORMAL HIGH (ref 70–99)
Potassium: 3.9 mEq/L (ref 3.5–5.1)
Sodium: 139 mEq/L (ref 135–145)
Total Bilirubin: 0.5 mg/dL (ref 0.2–1.2)
Total Protein: 6.7 g/dL (ref 6.0–8.3)

## 2019-10-28 LAB — LIPID PANEL
Cholesterol: 151 mg/dL (ref 0–200)
HDL: 48.3 mg/dL (ref >39.00)
LDL Cholesterol: 65 mg/dL (ref 0–99)
NonHDL: 102.37
Total CHOL/HDL Ratio: 3
Triglycerides: 185 mg/dL — ABNORMAL HIGH (ref 0.0–149.0)
VLDL: 37 mg/dL (ref 0.0–40.0)

## 2019-11-05 ENCOUNTER — Other Ambulatory Visit: Payer: Self-pay

## 2019-11-05 ENCOUNTER — Encounter: Payer: Self-pay | Admitting: Family Medicine

## 2019-11-05 ENCOUNTER — Other Ambulatory Visit: Payer: Self-pay | Admitting: *Deleted

## 2019-11-05 ENCOUNTER — Ambulatory Visit (INDEPENDENT_AMBULATORY_CARE_PROVIDER_SITE_OTHER): Payer: Medicare Other | Admitting: Family Medicine

## 2019-11-05 VITALS — Ht 65.0 in | Wt 159.0 lb

## 2019-11-05 DIAGNOSIS — E039 Hypothyroidism, unspecified: Secondary | ICD-10-CM

## 2019-11-05 DIAGNOSIS — M5441 Lumbago with sciatica, right side: Secondary | ICD-10-CM | POA: Diagnosis not present

## 2019-11-05 DIAGNOSIS — I2510 Atherosclerotic heart disease of native coronary artery without angina pectoris: Secondary | ICD-10-CM

## 2019-11-05 DIAGNOSIS — E7800 Pure hypercholesterolemia, unspecified: Secondary | ICD-10-CM

## 2019-11-05 DIAGNOSIS — Z20822 Contact with and (suspected) exposure to covid-19: Secondary | ICD-10-CM

## 2019-11-05 DIAGNOSIS — R0981 Nasal congestion: Secondary | ICD-10-CM | POA: Diagnosis not present

## 2019-11-05 DIAGNOSIS — F4323 Adjustment disorder with mixed anxiety and depressed mood: Secondary | ICD-10-CM

## 2019-11-05 DIAGNOSIS — M5442 Lumbago with sciatica, left side: Secondary | ICD-10-CM | POA: Diagnosis not present

## 2019-11-05 DIAGNOSIS — G8929 Other chronic pain: Secondary | ICD-10-CM | POA: Diagnosis not present

## 2019-11-05 DIAGNOSIS — Z20828 Contact with and (suspected) exposure to other viral communicable diseases: Secondary | ICD-10-CM | POA: Diagnosis not present

## 2019-11-05 MED ORDER — AMOXICILLIN-POT CLAVULANATE 875-125 MG PO TABS: 1.0000 | ORAL_TABLET | Freq: Two times a day (BID) | ORAL | 0 refills | Status: DC

## 2019-11-05 NOTE — Assessment & Plan Note (Signed)
Symptoms could be related to bacterial sinus infection versus COVID-19 versus viral illness.  Given her history sinus infection is most likely though given the rising COVID-19 cases I did discuss that I could not rule that out.  We will treat her with Augmentin for bacterial sinus infection.  She will go to have COVID-19 testing today.  Discussed strict quarantine precautions to last at least until we get the test results back.  She will seek medical attention in the ED if she develops shortness of breath or worsening symptoms.  We will contact her when her COVID-19 test result returns.

## 2019-11-05 NOTE — Progress Notes (Signed)
Virtual Visit via video Note  This visit type was conducted due to national recommendations for restrictions regarding the COVID-19 pandemic (e.g. social distancing).  This format is felt to be most appropriate for this patient at this time.  All issues noted in this document were discussed and addressed.  No physical exam was performed (except for noted visual exam findings with Video Visits).   I connected with Jodi Beasley today at 11:00 AM EST by a video enabled telemedicine application and verified that I am speaking with the correct person using two identifiers. Location patient: home Location provider: work Persons participating in the virtual visit: patient, provider  I discussed the limitations, risks, security and privacy concerns of performing an evaluation and management service by telephone and the availability of in person appointments. I also discussed with the patient that there may be a patient responsible charge related to this service. The patient expressed understanding and agreed to proceed.  Reason for visit: follow-up  HPI: Sinus congestion: Patient thinks she may have a sinus infection.  She typically gets one this time of year.  She has frontal sinus congestion and nasal congestion.  She has postnasal drip.  She is coughing up yellow mucus.  She is blowing out "nasty" mucus from her nose.  No fevers.  No COVID-19 exposure.  She notes she was tested 6 to 8 weeks ago and it was negative.  Has been using Mucinex.  Hypothyroidism: Taking Synthroid.  No skin changes.  No heat or cold intolerance.  Hyperlipidemia: Cholesterol panel relatively stable with LDL at goal.  Triglycerides are minimally elevated.  She thinks this is related to her sugar intake which has increased recently.  She is trying to work on decreasing that.  She continues on Lipitor.  She does walk her dogs and remains active at home.  Chronic back pain: She takes Soma daily for this with good benefit.   No drowsiness with this.  She does not drink alcohol with this.  She sees a Restaurant manager, fast food as well which is beneficial.  No numbness or weakness.  No incontinence.  Depression: She denies depressive symptoms.  She continues on Lexapro and Wellbutrin.   ROS: See pertinent positives and negatives per HPI.  Past Medical History:  Diagnosis Date  . Arrhythmia   . Chicken pox   . COPD (chronic obstructive pulmonary disease) ([place])   . Coronary artery disease    NSTEMI in 09/2008. Cath : 80% RCA and 95% first diagonal. PCI and 2 DES placement  RCA and 1 DES to diagonal. Complicated by acute diagonal stent thrombosis . Treated by thrombectomy. Most recent nuclear stress test in 2010 showed no ischemia with normal EF.   Marland Kitchen Depression   . GERD (gastroesophageal reflux disease)   . Hay fever   . History of blood transfusion   . History of hypercholesterolemia   . History of syncope 2000   negative EP study  . Hyperlipidemia    intolerance to statins except Crestor.   . Hypertension   . Hypothyroid   . MI (myocardial infarction) ([place])   . Pneumonia, organism unspecified(486)   . Sepsis ([place]) 04/27/2018    Past Surgical History:  Procedure Laterality Date  . CORONARY ANGIOPLASTY WITH STENT PLACEMENT  2009   [facility], drug-eluting mid RCA,prox diag;  . TONSILLECTOMY    . TUBAL LIGATION      Family History  Problem Relation Age of Onset  . Colon cancer Father   . Lung cancer Father  was a former smoker  . Cancer Father        lung  . Diabetes Father   . Hypertension Mother   . Hypertension Other   . Diabetes Other     SOCIAL HX: Smoker   Current Outpatient Medications:  .  aspirin 81 MG tablet, Take 81 mg by mouth daily., Disp: , Rfl:  .  atorvastatin (LIPITOR) 80 MG tablet, TAKE 1 TABLET(80 MG) BY MOUTH DAILY, Disp: 90 tablet, Rfl: 3 .  BREO ELLIPTA 100-25 MCG/INH AEPB, INHALE 1 PUFF BY MOUTH EVERY DAY, Disp: 60 each, Rfl: 3 .  buPROPion (WELLBUTRIN XL) 150 MG 24 hr  tablet, TAKE 1 TABLET(150 MG) BY MOUTH DAILY, Disp: 90 tablet, Rfl: 0 .  carisoprodol (SOMA) 350 MG tablet, TAKE 1 TABLET BY MOUTH TWICE DAILY AS NEEDED FOR MUSCLE SPASMS, Disp: 60 tablet, Rfl: 0 .  Cholecalciferol (VITAMIN D-3) 1000 UNITS CAPS, Take 1 capsule by mouth daily., Disp: , Rfl:  .  Coenzyme Q10 (COQ10) 400 MG CAPS, Take 400 mg by mouth daily., Disp: , Rfl:  .  escitalopram (LEXAPRO) 10 MG tablet, TAKE 1 TABLET(10 MG) BY MOUTH DAILY, Disp: 30 tablet, Rfl: 2 .  ezetimibe (ZETIA) 10 MG tablet, Take 1 tablet (10 mg total) by mouth daily., Disp: 90 tablet, Rfl: 3 .  fluticasone (FLONASE) 50 MCG/ACT nasal spray, SHAKE LIQUID AND USE 2 SPRAYS IN EACH NOSTRIL DAILY, Disp: 16 g, Rfl: 6 .  FLUZONE HIGH-DOSE QUADRIVALENT 0.7 ML SUSY, ADM 0.7ML IM UTD, Disp: , Rfl:  .  glucosamine-chondroitin 500-400 MG tablet, Take 1 tablet by mouth 2 (two) times daily., Disp: , Rfl:  .  levothyroxine (SYNTHROID) 100 MCG tablet, TAKE 1 TABLET(100 MCG) BY MOUTH DAILY BEFORE BREAKFAST, Disp: 90 tablet, Rfl: 1 .  metoprolol tartrate (LOPRESSOR) 25 MG tablet, Take 1 tablet (25 mg total) by mouth 2 (two) times daily., Disp: 60 tablet, Rfl: 11 .  montelukast (SINGULAIR) 10 MG tablet, TAKE 1 TABLET(10 MG) BY MOUTH AT BEDTIME, Disp: 30 tablet, Rfl: 3 .  Multiple Vitamin (MULTIVITAMIN) capsule, Take 1 capsule by mouth daily., Disp: , Rfl:  .  mupirocin ointment (BACTROBAN) 2 %, Apply 1 application topically 2 (two) times daily as needed., Disp: 22 g, Rfl: 0 .  nitroGLYCERIN (NITROSTAT) 0.4 MG SL tablet, Place 1 tablet (0.4 mg total) under the tongue every 5 (five) minutes as needed., Disp: 25 tablet, Rfl: 3 .  omeprazole (PRILOSEC) 20 MG capsule, TAKE 1 CAPSULE BY MOUTH EVERY DAY, Disp: 90 capsule, Rfl: 0 .  Psyllium (METAMUCIL FIBER PO), Take 1 Package by mouth daily as needed., Disp: , Rfl:  .  amoxicillin-clavulanate (AUGMENTIN) 875-125 MG tablet, Take 1 tablet by mouth 2 (two) times daily., Disp: 14 tablet, Rfl: 0   EXAM:  VITALS per patient if applicable: None.  GENERAL: alert, oriented, appears well and in no acute distress  HEENT: atraumatic, conjunttiva clear, no obvious abnormalities on inspection of external nose and ears  NECK: normal movements of the head and neck  LUNGS: on inspection no signs of respiratory distress, breathing rate appears normal, no obvious gross SOB, gasping or wheezing  CV: no obvious cyanosis  MS: moves all visible extremities without noticeable abnormality  PSYCH/NEURO: pleasant and cooperative, no obvious depression or anxiety, speech and thought processing grossly intact  ASSESSMENT AND PLAN:  Discussed the following assessment and plan:  Sinus congestion Symptoms could be related to bacterial sinus infection versus COVID-19 versus viral illness.  Given her history sinus  infection is most likely though given the rising COVID-19 cases I did discuss that I could not rule that out.  We will treat her with Augmentin for bacterial sinus infection.  She will go to have COVID-19 testing today.  Discussed strict quarantine precautions to last at least until we get the test results back.  She will seek medical attention in the ED if she develops shortness of breath or worsening symptoms.  We will contact her when her COVID-19 test result returns.  Hypothyroidism Continue Synthroid.  Plan for recheck of TSH with next labs.  Chronic low back pain (Primary Area of Pain) (Bilateral) (L>R) Stable.  She will continue Soma.  She will continue to see the chiropractor as this has been beneficial.  Adjustment disorder with mixed anxiety and depressed mood Well-controlled.  Continue current regimen.  Hypercholesterolemia Triglycerides were minimally elevated.  She will work on decreasing carbohydrate and sugar intake.  Continue current regimen otherwise.    I discussed the assessment and treatment plan with the patient. The patient was provided an opportunity to ask questions  and all were answered. The patient agreed with the plan and demonstrated an understanding of the instructions.   The patient was advised to call back or seek an in-person evaluation if the symptoms worsen or if the condition fails to improve as anticipated.   [name], MD

## 2019-11-05 NOTE — Assessment & Plan Note (Signed)
Triglycerides were minimally elevated.  She will work on decreasing carbohydrate and sugar intake.  Continue current regimen otherwise.

## 2019-11-05 NOTE — Assessment & Plan Note (Signed)
Stable.  She will continue Soma.  She will continue to see the chiropractor as this has been beneficial.

## 2019-11-05 NOTE — Assessment & Plan Note (Signed)
Continue Synthroid.  Plan for recheck of TSH with next labs.

## 2019-11-05 NOTE — Assessment & Plan Note (Signed)
Well-controlled.  Continue current regimen. 

## 2019-11-07 LAB — NOVEL CORONAVIRUS, NAA: SARS-CoV-2, NAA: NOT DETECTED

## 2019-11-13 ENCOUNTER — Other Ambulatory Visit: Payer: Self-pay

## 2019-11-13 DIAGNOSIS — M9901 Segmental and somatic dysfunction of cervical region: Secondary | ICD-10-CM | POA: Diagnosis not present

## 2019-11-13 DIAGNOSIS — M5033 Other cervical disc degeneration, cervicothoracic region: Secondary | ICD-10-CM | POA: Diagnosis not present

## 2019-11-13 DIAGNOSIS — M5416 Radiculopathy, lumbar region: Secondary | ICD-10-CM | POA: Diagnosis not present

## 2019-11-13 DIAGNOSIS — M9903 Segmental and somatic dysfunction of lumbar region: Secondary | ICD-10-CM | POA: Diagnosis not present

## 2019-11-13 MED ORDER — OMEPRAZOLE 20 MG PO CPDR: DELAYED_RELEASE_CAPSULE | ORAL | 0 refills | Status: DC

## 2019-11-13 NOTE — Telephone Encounter (Signed)
Refill request for Omeprazole sent to patient's pharmacy.  Rafael Quesada,cma

## 2019-11-14 ENCOUNTER — Other Ambulatory Visit: Payer: Self-pay | Admitting: Family Medicine

## 2019-11-14 MED ORDER — CARISOPRODOL 350 MG PO TABS: ORAL_TABLET | ORAL | 0 refills | Status: DC

## 2019-11-14 NOTE — Telephone Encounter (Signed)
Medication Refill - Medication: carisoprodol (SOMA) 350 MG tablet  Has the patient contacted their pharmacy? Yes - states pharmacy hasn't heard from [place] since Monday (Agent: If no, request that the patient contact the pharmacy for the refill.) (Agent: If yes, when and what did the pharmacy advise?)  Preferred Pharmacy (with phone number or street name):  [facility] [id number], [place] Phone:  [phone]  Fax:  [phone]     Agent: Please be advised that RX refills may take up to 3 business days. We ask that you follow-up with your pharmacy.

## 2019-11-14 NOTE — Telephone Encounter (Signed)
Message received when attempting to reorder requested Soma: Reordered from carisoprodol (SOMA) 350 MG tablet is not on the preferred formulary for the patient's insurance plan. Below are alternatives which are likely to be more affordable. Do not assume that every medication presented is a clinically appropriate alternative. Please review for refill if appropriate.

## 2019-11-14 NOTE — Telephone Encounter (Signed)
Soma sent to pharmacy.

## 2019-11-22 ENCOUNTER — Other Ambulatory Visit: Payer: Self-pay | Admitting: Family Medicine

## 2019-12-10 DIAGNOSIS — M5416 Radiculopathy, lumbar region: Secondary | ICD-10-CM | POA: Diagnosis not present

## 2019-12-10 DIAGNOSIS — M9903 Segmental and somatic dysfunction of lumbar region: Secondary | ICD-10-CM | POA: Diagnosis not present

## 2019-12-10 DIAGNOSIS — M9901 Segmental and somatic dysfunction of cervical region: Secondary | ICD-10-CM | POA: Diagnosis not present

## 2019-12-10 DIAGNOSIS — M5033 Other cervical disc degeneration, cervicothoracic region: Secondary | ICD-10-CM | POA: Diagnosis not present

## 2019-12-17 ENCOUNTER — Other Ambulatory Visit: Payer: Self-pay | Admitting: *Deleted

## 2019-12-18 ENCOUNTER — Other Ambulatory Visit: Payer: Self-pay

## 2019-12-18 ENCOUNTER — Ambulatory Visit (INDEPENDENT_AMBULATORY_CARE_PROVIDER_SITE_OTHER): Payer: Medicare Other | Admitting: Emergency Medicine

## 2019-12-18 ENCOUNTER — Encounter: Payer: Self-pay | Admitting: Emergency Medicine

## 2019-12-18 DIAGNOSIS — F17210 Nicotine dependence, cigarettes, uncomplicated: Secondary | ICD-10-CM

## 2019-12-18 DIAGNOSIS — Z720 Tobacco use: Secondary | ICD-10-CM

## 2019-12-18 DIAGNOSIS — J309 Allergic rhinitis, unspecified: Secondary | ICD-10-CM

## 2019-12-18 DIAGNOSIS — J449 Chronic obstructive pulmonary disease, unspecified: Secondary | ICD-10-CM

## 2019-12-18 NOTE — Assessment & Plan Note (Signed)
Extended discussion today about the stressors that contribute to her tobacco use.  We talked about techniques to try and cut down.  She will start setting goals to cut down and use an allocation technique to achieve this.  We will review next time.

## 2019-12-18 NOTE — Patient Instructions (Signed)
Please continue Breo 1 inhalation once daily.  Rinse and gargle after using. We may decide to change you to an alternative inhaler such as Trelegy at some point in the future. Keep your albuterol available to use 2 puffs if you need it for shortness of breath, chest tightness, wheezing. Flu shot and pneumonia shot are both up-to-date. You would benefit from the COVID-19 vaccine and should be able to tolerate this based on your history.  You can inquire about arranging this with the [facility] system by calling [phone].  Continue your Singulair 10 mg each evening as you have been taking it. Use your fluticasone nasal spray, 2 sprays each nostril daily if needed for increased congestion and drainage, cough. We talked today about decreasing your cigarettes.  Set some achievable goals to slowly decrease the number of cigarettes you smoke every day.  For starters we will cut down to 19 cigarettes daily. Follow with Dr Lamonte Sakai in 6 months or sooner if you have any problems

## 2019-12-18 NOTE — Assessment & Plan Note (Signed)
Continue your Singulair 10 mg each evening as you have been taking it. Use your fluticasone nasal spray, 2 sprays each nostril daily if needed for increased congestion and drainage, cough.

## 2019-12-18 NOTE — Assessment & Plan Note (Signed)
She had some increased symptoms when she was off Breo for 2 months at the end of the year, improved now that she is back on.  She has had to modify her activities over the last several years.  We talked today about possibly changing to Trelegy to add the LAMA component.  We will revisit this going forward.  Please continue Breo 1 inhalation once daily.  Rinse and gargle after using. We may decide to change you to an alternative inhaler such as Trelegy at some point in the future. Keep your albuterol available to use 2 puffs if you need it for shortness of breath, chest tightness, wheezing. Flu shot and pneumonia shot are both up-to-date. You would benefit from the COVID-19 vaccine and should be able to tolerate this based on your history.  You can inquire about arranging this with the [facility] system by calling [phone].  Follow with Dr Lamonte Sakai in 6 months or sooner if you have any problems

## 2019-12-18 NOTE — Progress Notes (Signed)
Subjective:    Patient ID: Jodi Beasley, female    DOB: 19-Nov-1946, 74 y.o.   MRN: [id number]  HPI 74 year old smoker (44 pack years) who has been followed in our office by Dr. Lake Bells for COPD.  She also has a history of hypertension, coronary artery disease/PTCI, depression, GERD, allergic rhinitis, hypothyroidism, chronic renal insufficiency.  She is currently managed on flonase prn, singulair, Breo (did not tolerate Symbicort due to sinusitis). Reports that she can get some SOB with mowing lawn, vacuuming and has to pace herself. She gets some cough, usually dry, often associated with some sneezing, watery eyes. Intermittent nasal gtt, uses the flonase for this. She uses albuterol rarely, maybe once a month. She was treated for an AE in December, had 2 flares in the last year. She continues to smoke 1 pk/day.   Pneumovax up to date. Flu shot up to date.     Pulmonary function testing from 03/15/2016 reviewed, shows severe obstruction, possible superimposed restriction with a positive bronchodilator response.  Her lung volumes confirm restriction, diffusion capacity is decreased and does not correct for alveolar volume  Most recent available chest x-ray reviewed from 04/16/2018 showed no evidence of infiltrate, effusion or hyperinflation.   Review of Systems  Past Medical History:  Diagnosis Date  . Arrhythmia   . Chicken pox   . COPD (chronic obstructive pulmonary disease) ([place])   . Coronary artery disease    NSTEMI in 09/2008. Cath : 80% RCA and 95% first diagonal. PCI and 2 DES placement  RCA and 1 DES to diagonal. Complicated by acute diagonal stent thrombosis . Treated by thrombectomy. Most recent nuclear stress test in 2010 showed no ischemia with normal EF.   Marland Kitchen Depression   . GERD (gastroesophageal reflux disease)   . Hay fever   . History of blood transfusion   . History of hypercholesterolemia   . History of syncope 2000   negative EP study  . Hyperlipidemia     intolerance to statins except Crestor.   . Hypertension   . Hypothyroid   . MI (myocardial infarction) ([place])   . Pneumonia, organism unspecified(486)   . Sepsis ([place]) 04/27/2018     Family History  Problem Relation Age of Onset  . Colon cancer Father   . Lung cancer Father        was a former smoker  . Cancer Father        lung  . Diabetes Father   . Hypertension Mother   . Hypertension Other   . Diabetes Other      Social History   Socioeconomic History  . Marital status: Widowed    Spouse name: Not on file  . Number of children: Not on file  . Years of education: Not on file  . Highest education level: Not on file  Occupational History  . Not on file  Tobacco Use  . Smoking status: Current Every Day Smoker    Packs/day: 1.00    Years: 53.00    Pack years: 53.00    Types: E-cigarettes, Cigarettes    Start date: 11/19/1960  . Smokeless tobacco: Never Used  . Tobacco comment: down to 0.75ppd 1pk a day 01/30/19  Substance and Sexual Activity  . Alcohol use: Yes    Alcohol/week: 1.0 standard drinks    Types: 1 Standard drinks or equivalent per week  . Drug use: No  . Sexual activity: Yes  Other Topics Concern  . Not on file  Social History Narrative  Lives with mother in [place]. 2 cats 2 dogs in home.   Recently moved from [facility].   Custody of 37month old.   Social Determinants of Health   Financial Resource Strain:   . Difficulty of Paying Living Expenses: Not on file  Food Insecurity:   . Worried About Charity fundraiser in the Last Year: Not on file  . Ran Out of Food in the Last Year: Not on file  Transportation Needs:   . Lack of Transportation (Medical): Not on file  . Lack of Transportation (Non-Medical): Not on file  Physical Activity:   . Days of Exercise per Week: Not on file  . Minutes of Exercise per Session: Not on file  Stress:   . Feeling of Stress : Not on file  Social Connections:   . Frequency of Communication with Friends and  Family: Not on file  . Frequency of Social Gatherings with Friends and Family: Not on file  . Attends Religious Services: Not on file  . Active Member of Clubs or Organizations: Not on file  . Attends Archivist Meetings: Not on file  . Marital Status: Not on file  Intimate Partner Violence:   . Fear of Current or Ex-Partner: Not on file  . Emotionally Abused: Not on file  . Physically Abused: Not on file  . Sexually Abused: Not on file     Allergies  Allergen Reactions  . Benzocaine     Tongue swelling  . Darvon [Propoxyphene Hcl]     HA  . Monosodium Glutamate Diarrhea  . Neosporin [Neomycin-Bacitracin Zn-Polymyx]     blisters  . Nicoderm [Nicotine]     arrythmia  . Prednisone     Cannot tolerate high doses per patient- caused depressive thoughts     Outpatient Medications Prior to Visit  Medication Sig Dispense Refill  . aspirin 81 MG tablet Take 81 mg by mouth daily.    Marland Kitchen atorvastatin (LIPITOR) 80 MG tablet TAKE 1 TABLET(80 MG) BY MOUTH DAILY 90 tablet 3  . buPROPion (WELLBUTRIN XL) 150 MG 24 hr tablet TAKE 1 TABLET(150 MG) BY MOUTH DAILY 90 tablet 0  . carisoprodol (SOMA) 350 MG tablet TAKE 1 TABLET BY MOUTH TWICE DAILY AS NEEDED FOR MUSCLE SPASMS 60 tablet 0  . Cholecalciferol (VITAMIN D-3) 1000 UNITS CAPS Take 1 capsule by mouth daily.    . Coenzyme Q10 (COQ10) 400 MG CAPS Take 400 mg by mouth daily.    Marland Kitchen escitalopram (LEXAPRO) 10 MG tablet TAKE 1 TABLET(10 MG) BY MOUTH DAILY 30 tablet 2  . ezetimibe (ZETIA) 10 MG tablet TAKE 1 TABLET(10 MG) BY MOUTH DAILY 90 tablet 3  . fluticasone (FLONASE) 50 MCG/ACT nasal spray SHAKE LIQUID AND USE 2 SPRAYS IN EACH NOSTRIL DAILY 16 g 6  . FLUZONE HIGH-DOSE QUADRIVALENT 0.7 ML SUSY ADM 0.7ML IM UTD    . glucosamine-chondroitin 500-400 MG tablet Take 1 tablet by mouth 2 (two) times daily.    Marland Kitchen levothyroxine (SYNTHROID) 100 MCG tablet TAKE 1 TABLET(100 MCG) BY MOUTH DAILY BEFORE BREAKFAST 90 tablet 1  . metoprolol tartrate  (LOPRESSOR) 25 MG tablet Take 1 tablet (25 mg total) by mouth 2 (two) times daily. 60 tablet 11  . montelukast (SINGULAIR) 10 MG tablet TAKE 1 TABLET(10 MG) BY MOUTH AT BEDTIME 30 tablet 3  . Multiple Vitamin (MULTIVITAMIN) capsule Take 1 capsule by mouth daily.    . mupirocin ointment (BACTROBAN) 2 % Apply 1 application topically 2 (two) times daily  as needed. 22 g 0  . nitroGLYCERIN (NITROSTAT) 0.4 MG SL tablet Place 1 tablet (0.4 mg total) under the tongue every 5 (five) minutes as needed. 25 tablet 3  . omeprazole (PRILOSEC) 20 MG capsule TAKE 1 CAPSULE BY MOUTH EVERY DAY 90 capsule 0  . Psyllium (METAMUCIL FIBER PO) Take 1 Package by mouth daily as needed.     No facility-administered medications prior to visit.        Objective:   Physical Exam Vitals:   12/18/19 1149  BP: 136/74  Pulse: 68  Temp: 98.3 F (36.8 C)  TempSrc: Temporal  SpO2: 97%  Weight: 159 lb 6.4 oz (72.3 kg)  Height: 5\' 6"  (1.676 m)   Gen: Pleasant, well-nourished, in no distress,  normal affect  ENT: No lesions,  mouth clear,  oropharynx clear, no postnasal drip  Neck: No JVD, no stridor  Lungs: No use of accessory muscles, no crackles or wheezing on normal respiration, no wheeze on forced expiration  Cardiovascular: RRR, heart sounds normal, no murmur or gallops, no peripheral edema  Musculoskeletal: No deformities, no cyanosis or clubbing  Neuro: alert, awake, non focal  Skin: Warm, no lesions or rash      Assessment & Plan:  COPD (chronic obstructive pulmonary disease) with chronic bronchitis (HCC) She had some increased symptoms when she was off Breo for 2 months at the end of the year, improved now that she is back on.  She has had to modify her activities over the last several years.  We talked today about possibly changing to Trelegy to add the LAMA component.  We will revisit this going forward.  Please continue Breo 1 inhalation once daily.  Rinse and gargle after using. We may decide  to change you to an alternative inhaler such as Trelegy at some point in the future. Keep your albuterol available to use 2 puffs if you need it for shortness of breath, chest tightness, wheezing. Flu shot and pneumonia shot are both up-to-date. You would benefit from the COVID-19 vaccine and should be able to tolerate this based on your history.  You can inquire about arranging this with the [facility] system by calling [phone].  Follow with Dr Lamonte Sakai in 6 months or sooner if you have any problems  Allergic rhinitis Continue your Singulair 10 mg each evening as you have been taking it. Use your fluticasone nasal spray, 2 sprays each nostril daily if needed for increased congestion and drainage, cough.   Tobacco abuse Extended discussion today about the stressors that contribute to her tobacco use.  We talked about techniques to try and cut down.  She will start setting goals to cut down and use an allocation technique to achieve this.  We will review next time.    Baltazar Apo, MD, PhD 12/18/2019, 12:29 PM [facility] [phone] or if no answer [phone]

## 2019-12-30 ENCOUNTER — Other Ambulatory Visit: Payer: Self-pay | Admitting: Family Medicine

## 2019-12-31 ENCOUNTER — Other Ambulatory Visit: Payer: Self-pay | Admitting: Family Medicine

## 2019-12-31 DIAGNOSIS — M5033 Other cervical disc degeneration, cervicothoracic region: Secondary | ICD-10-CM | POA: Diagnosis not present

## 2019-12-31 DIAGNOSIS — M9903 Segmental and somatic dysfunction of lumbar region: Secondary | ICD-10-CM | POA: Diagnosis not present

## 2019-12-31 DIAGNOSIS — M9901 Segmental and somatic dysfunction of cervical region: Secondary | ICD-10-CM | POA: Diagnosis not present

## 2019-12-31 DIAGNOSIS — M5416 Radiculopathy, lumbar region: Secondary | ICD-10-CM | POA: Diagnosis not present

## 2020-01-01 NOTE — Telephone Encounter (Signed)
PDMP reviewed.  Soma refill x 1 sent in to pharmacy.

## 2020-01-06 ENCOUNTER — Other Ambulatory Visit: Payer: Self-pay | Admitting: Family Medicine

## 2020-01-08 ENCOUNTER — Other Ambulatory Visit: Payer: Self-pay | Admitting: Pulmonary Disease

## 2020-01-08 ENCOUNTER — Other Ambulatory Visit: Payer: Self-pay | Admitting: Family Medicine

## 2020-01-09 ENCOUNTER — Other Ambulatory Visit: Payer: Self-pay | Admitting: *Deleted

## 2020-01-09 MED ORDER — BREO ELLIPTA 100-25 MCG/INH IN AEPB: 1.0000 | INHALATION_SPRAY | Freq: Every day | RESPIRATORY_TRACT | 5 refills | Status: DC

## 2020-01-25 ENCOUNTER — Ambulatory Visit: Payer: Medicare Other | Attending: Internal Medicine

## 2020-01-25 DIAGNOSIS — Z23 Encounter for immunization: Secondary | ICD-10-CM | POA: Insufficient documentation

## 2020-01-25 NOTE — Progress Notes (Signed)
   Covid-19 Vaccination Clinic  Name:  Jodi Beasley    MRN: [id number] DOB: Mar 23, 1946  01/25/2020  Jodi Beasley was observed post Covid-19 immunization for 15 minutes without incidence. She was provided with Vaccine Information Sheet and instruction to access the V-Safe system.   Jodi Beasley was instructed to call 911 with any severe reactions post vaccine: Marland Kitchen Difficulty breathing  . Swelling of your face and throat  . A fast heartbeat  . A bad rash all over your body  . Dizziness and weakness    Immunizations Administered    Name Date Dose VIS Date Route   Pfizer COVID-19 Vaccine 01/25/2020 12:47 PM 0.3 mL 11/15/2019 Intramuscular   Manufacturer: [place]   Lot: [id number]   [place]: [id number]

## 2020-01-28 DIAGNOSIS — M9901 Segmental and somatic dysfunction of cervical region: Secondary | ICD-10-CM | POA: Diagnosis not present

## 2020-01-28 DIAGNOSIS — M9903 Segmental and somatic dysfunction of lumbar region: Secondary | ICD-10-CM | POA: Diagnosis not present

## 2020-01-28 DIAGNOSIS — M5416 Radiculopathy, lumbar region: Secondary | ICD-10-CM | POA: Diagnosis not present

## 2020-01-28 DIAGNOSIS — M5033 Other cervical disc degeneration, cervicothoracic region: Secondary | ICD-10-CM | POA: Diagnosis not present

## 2020-02-04 DIAGNOSIS — M5416 Radiculopathy, lumbar region: Secondary | ICD-10-CM | POA: Diagnosis not present

## 2020-02-04 DIAGNOSIS — M5033 Other cervical disc degeneration, cervicothoracic region: Secondary | ICD-10-CM | POA: Diagnosis not present

## 2020-02-04 DIAGNOSIS — M9903 Segmental and somatic dysfunction of lumbar region: Secondary | ICD-10-CM | POA: Diagnosis not present

## 2020-02-04 DIAGNOSIS — M9901 Segmental and somatic dysfunction of cervical region: Secondary | ICD-10-CM | POA: Diagnosis not present

## 2020-02-10 ENCOUNTER — Other Ambulatory Visit: Payer: Self-pay | Admitting: Family Medicine

## 2020-02-14 ENCOUNTER — Other Ambulatory Visit: Payer: Self-pay | Admitting: Family Medicine

## 2020-02-17 ENCOUNTER — Other Ambulatory Visit: Payer: Self-pay | Admitting: Internal Medicine

## 2020-02-18 ENCOUNTER — Ambulatory Visit: Payer: Medicare Other | Attending: Internal Medicine

## 2020-02-18 DIAGNOSIS — Z23 Encounter for immunization: Secondary | ICD-10-CM

## 2020-02-18 NOTE — Progress Notes (Signed)
   Covid-19 Vaccination Clinic  Name:  Jodi Beasley    MRN: [id number] DOB: [birth year]  02/18/2020  [name] was observed post Covid-19 immunization for 15 minutes without incident. She was provided with Vaccine Information Sheet and instruction to access the V-Safe system.   [name] was instructed to call 911 with any severe reactions post vaccine: Marland Kitchen Difficulty breathing  . Swelling of face and throat  . A fast heartbeat  . A bad rash all over body  . Dizziness and weakness   Immunizations Administered    Name Date Dose VIS Date Route   Pfizer COVID-19 Vaccine 02/18/2020  2:12 PM 0.3 mL 11/15/2019 Intramuscular   Manufacturer: [place]   Lot: [id number]   [place]: [id number]

## 2020-02-20 ENCOUNTER — Other Ambulatory Visit: Payer: Self-pay | Admitting: Family Medicine

## 2020-02-20 MED ORDER — CARISOPRODOL 350 MG PO TABS: ORAL_TABLET | ORAL | 0 refills | Status: DC

## 2020-02-20 NOTE — Telephone Encounter (Signed)
Pt needs a refill on carisoprodol (SOMA) 350 MG tablet

## 2020-02-21 ENCOUNTER — Ambulatory Visit (INDEPENDENT_AMBULATORY_CARE_PROVIDER_SITE_OTHER): Payer: Medicare Other

## 2020-02-21 ENCOUNTER — Ambulatory Visit: Payer: Self-pay | Admitting: Family Medicine

## 2020-02-21 ENCOUNTER — Other Ambulatory Visit: Payer: Self-pay

## 2020-02-21 VITALS — Ht 66.0 in | Wt 159.0 lb

## 2020-02-21 DIAGNOSIS — Z0000 Encounter for general adult medical examination without abnormal findings: Secondary | ICD-10-CM | POA: Diagnosis not present

## 2020-02-21 NOTE — Progress Notes (Signed)
Subjective:   Jodi Beasley is a 74 y.o. female who presents for Medicare Annual (Subsequent) preventive examination.  Review of Systems:  No ROS.  Medicare Wellness Virtual Visit.  Visual/audio telehealth visit, UTA vital signs.   Ht/Wt provided.  See social history for additional risk factors.   Cardiac Risk Factors include: advanced age (>7men, >54 women);hypertension     Objective:     Vitals: Ht 5\' 6"  (1.676 m)   Wt 159 lb (72.1 kg)   BMI 25.66 kg/m   Body mass index is 25.66 kg/m.  Advanced Directives 02/21/2020 02/19/2019 12/29/2018 05/18/2018 04/16/2018 02/16/2018 03/27/2017  Does Patient Have a Medical Advance Directive? Yes Yes Yes Yes Yes Yes Yes  Type of Paramedic of [place];Living will [place];Living will [place];Living will [place];Living will Living will;Healthcare Power of [place];Living will Living will  Does patient want to make changes to medical advance directive? No - Patient declined No - Patient declined - - No - Patient declined No - Patient declined -  Copy of [place] in Chart? No - copy requested No - copy requested - - No - copy requested No - copy requested -    Tobacco Social History   Tobacco Use  Smoking Status Current Every Day Smoker  . Packs/day: 1.00  . Years: 53.00  . Pack years: 53.00  . Types: E-cigarettes, Cigarettes  . Start date: 11/19/1960  Smokeless Tobacco Never Used  Tobacco Comment   down to 0.75ppd 1pk a day 01/30/19     Ready to quit: Not Answered Counseling given: Not Answered Comment: down to 0.75ppd 1pk a day 01/30/19   Clinical Intake:  Pre-visit preparation completed: Yes        Diabetes: No  How often do you need to have someone help you when you read instructions, pamphlets, or other written materials from your doctor or pharmacy?: 1 - Never  Interpreter Needed?:  No     Past Medical History:  Diagnosis Date  . Arrhythmia   . Chicken pox   . COPD (chronic obstructive pulmonary disease) ([place])   . Coronary artery disease    NSTEMI in 09/2008. Cath : 80% RCA and 95% first diagonal. PCI and 2 DES placement  RCA and 1 DES to diagonal. Complicated by acute diagonal stent thrombosis . Treated by thrombectomy. Most recent nuclear stress test in 2010 showed no ischemia with normal EF.   Marland Kitchen Depression   . GERD (gastroesophageal reflux disease)   . Hay fever   . History of blood transfusion   . History of hypercholesterolemia   . History of syncope 2000   negative EP study  . Hyperlipidemia    intolerance to statins except Crestor.   . Hypertension   . Hypothyroid   . MI (myocardial infarction) ([place])   . Pneumonia, organism unspecified(486)   . Sepsis ([place]) 04/27/2018   Past Surgical History:  Procedure Laterality Date  . CORONARY ANGIOPLASTY WITH STENT PLACEMENT  2009   [facility], drug-eluting mid RCA,prox diag;  . TONSILLECTOMY    . TUBAL LIGATION     Family History  Problem Relation Age of Onset  . Colon cancer Father   . Lung cancer Father        was a former smoker  . Cancer Father        lung  . Diabetes Father   . Hypertension Mother   . Hypertension Other   .  Diabetes Other    Social History   Socioeconomic History  . Marital status: Widowed    Spouse name: Not on file  . Number of children: Not on file  . Years of education: Not on file  . Highest education level: Not on file  Occupational History  . Not on file  Tobacco Use  . Smoking status: Current Every Day Smoker    Packs/day: 1.00    Years: 53.00    Pack years: 53.00    Types: E-cigarettes, Cigarettes    Start date: 11/19/1960  . Smokeless tobacco: Never Used  . Tobacco comment: down to 0.75ppd 1pk a day 01/30/19  Substance and Sexual Activity  . Alcohol use: Yes    Alcohol/week: 1.0 standard drinks    Types: 1 Standard drinks or equivalent per week  .  Drug use: No  . Sexual activity: Yes  Other Topics Concern  . Not on file  Social History Narrative   Lives with mother in [place]. 2 cats 2 dogs in home.   Recently moved from [facility]   Custody of 49month old.   Social Determinants of Health   Financial Resource Strain:   . Difficulty of Paying Living Expenses:   Food Insecurity:   . Worried About Charity fundraiser in the Last Year:   . Arboriculturist in the Last Year:   Transportation Needs:   . Film/video editor (Medical):   Marland Kitchen Lack of Transportation (Non-Medical):   Physical Activity:   . Days of Exercise per Week:   . Minutes of Exercise per Session:   Stress:   . Feeling of Stress :   Social Connections:   . Frequency of Communication with Friends and Family:   . Frequency of Social Gatherings with Friends and Family:   . Attends Religious Services:   . Active Member of Clubs or Organizations:   . Attends Archivist Meetings:   Marland Kitchen Marital Status:     Outpatient Encounter Medications as of 02/21/2020  Medication Sig  . aspirin 81 MG tablet Take 81 mg by mouth daily.  Marland Kitchen atorvastatin (LIPITOR) 80 MG tablet TAKE 1 TABLET(80 MG) BY MOUTH DAILY  . buPROPion (WELLBUTRIN XL) 150 MG 24 hr tablet TAKE 1 TABLET(150 MG) BY MOUTH DAILY  . carisoprodol (SOMA) 350 MG tablet TAKE 1 TABLET BY MOUTH TWICE DAILY AS NEEDED FOR MUSCLE SPASMS  . Cholecalciferol (VITAMIN D-3) 1000 UNITS CAPS Take 1 capsule by mouth daily.  . Coenzyme Q10 (COQ10) 400 MG CAPS Take 400 mg by mouth daily.  Marland Kitchen escitalopram (LEXAPRO) 10 MG tablet TAKE 1 TABLET BY MOUTH EVERY DAY  . ezetimibe (ZETIA) 10 MG tablet TAKE 1 TABLET(10 MG) BY MOUTH DAILY  . fluticasone (FLONASE) 50 MCG/ACT nasal spray SHAKE LIQUID AND USE 2 SPRAYS IN EACH NOSTRIL DAILY  . fluticasone furoate-vilanterol (BREO ELLIPTA) 100-25 MCG/INH AEPB Inhale 1 puff into the lungs daily.  Marland Kitchen FLUZONE HIGH-DOSE QUADRIVALENT 0.7 ML SUSY ADM 0.7ML IM UTD  . glucosamine-chondroitin 500-400  MG tablet Take 1 tablet by mouth 2 (two) times daily.  Marland Kitchen levothyroxine (SYNTHROID) 100 MCG tablet TAKE 1 TABLET(100 MCG) BY MOUTH DAILY BEFORE BREAKFAST  . metoprolol tartrate (LOPRESSOR) 25 MG tablet Take 1 tablet (25 mg total) by mouth 2 (two) times daily.  . montelukast (SINGULAIR) 10 MG tablet TAKE 1 TABLET(10 MG) BY MOUTH AT BEDTIME  . Multiple Vitamin (MULTIVITAMIN) capsule Take 1 capsule by mouth daily.  . mupirocin ointment (BACTROBAN) 2 %  Apply 1 application topically 2 (two) times daily as needed.  . nitroGLYCERIN (NITROSTAT) 0.4 MG SL tablet Place 1 tablet (0.4 mg total) under the tongue every 5 (five) minutes as needed.  Marland Kitchen omeprazole (PRILOSEC) 20 MG capsule TAKE 1 CAPSULE BY MOUTH EVERY DAY  . Psyllium (METAMUCIL FIBER PO) Take 1 Package by mouth daily as needed.   No facility-administered encounter medications on file as of 02/21/2020.    Activities of Daily Living In your present state of health, do you have any difficulty performing the following activities: 02/21/2020  Hearing? Y  Comment Hearing aid  Vision? N  Difficulty concentrating or making decisions? N  Walking or climbing stairs? N  Dressing or bathing? N  Doing errands, shopping? N  Preparing Food and eating ? N  Using the Toilet? N  In the past six months, have you accidently leaked urine? Y  Comment Managed with daily liner  Do you have problems with loss of bowel control? N  Managing your Medications? N  Managing your Finances? N  Housekeeping or managing your Housekeeping? N  Some recent data might be hidden    Patient Care Team: Leone Haven, MD as PCP - General (Family Medicine) Croitoru, Dani Gobble, MD as PCP - Cardiology (Cardiology)    Assessment:   This is a routine wellness examination for Jodi Beasley.  Nurse connected with patient 02/21/20 at 10:30 AM EDT by a telephone enabled telemedicine application and verified that I am speaking with the correct person using two identifiers. Patient stated  full name and DOB. Patient gave permission to continue with virtual visit. Patient's location was at home and Nurse's location was at [place] office.   Patient is alert and oriented x3. Patient denies difficulty focusing or concentrating. Patient likes to read and play computer games for brain stimulation.   Health Maintenance Due: -See completed HM at the end of note.   Eye: Visual acuity not assessed. Virtual visit. Followed by their ophthalmologist.  Dental: Visits every 6 months.    Hearing: Hearing aids- yes  Safety:  Patient feels safe at home- yes Patient does have smoke detectors at home- yes Patient does wear sunscreen or protective clothing when in direct sunlight - yes Patient does wear seat belt when in a moving vehicle - yes Patient drives- yes Adequate lighting in walkways free from debris- yes Grab bars and handrails used as appropriate- yes Ambulates with an assistive device- no Cell phone on person when ambulating outside of the home- yes  Social: Alcohol intake - yes      Smoking history- current Illicit drug use? none  Medication: Taking as directed and without issues.  Pill box in use -yes  Self managed - yes   Covid-19: Precautions and sickness symptoms discussed. Wears mask, social distancing, hand hygiene as appropriate.   Activities of Daily Living Patient denies needing assistance with: household chores, feeding themselves, getting from bed to chair, getting to the toilet, bathing/showering, dressing, managing money, or preparing meals.   Discussed the importance of a healthy diet, water intake and the benefits of aerobic exercise.   Physical activity- active in the home.   Diet:  Regular Water: good intake Caffeine: 2 cups   Other Providers Patient Care Team: Leone Haven, MD as PCP - General (Family Medicine) Croitoru, Dani Gobble, MD as PCP - Cardiology (Cardiology)  Exercise Activities and Dietary recommendations Current Exercise  Habits: Home exercise routine, Intensity: Mild  Goals      Patient Stated   .  I want to get my allergies under control (pt-stated)     Follow up with doctor and allergy specialist       Fall Risk Fall Risk  02/21/2020 11/05/2019 02/19/2019 11/21/2018 02/16/2018  Falls in the past year? 0 0 0 1 No  Comment - - - - -  Number falls in past yr: - 0 - 1 -  Comment - - - - -  Injury with Fall? - - - 0 -  Comment - - - - -  Risk for fall due to : - - - - -  Follow up Falls evaluation completed Falls evaluation completed - - -  Comment - - - - -   Timed Get Up and Go performed: no, virtual visit  Depression Screen PHQ 2/9 Scores 02/21/2020 11/05/2019 02/19/2019 11/21/2018  PHQ - 2 Score 0 0 - 0  PHQ- 9 Score - - - -  Exception Documentation - - Other- indicate reason in comment box -     Cognitive Function MMSE - Mini Mental State Exam 02/15/2017  Orientation to time 5  Orientation to Place 5  Registration 3  Attention/ Calculation 5  Recall 3  Language- name 2 objects 2  Language- repeat 1  Language- follow 3 step command 3  Language- read & follow direction 1  Write a sentence 1  Copy design 1  Total score 30     6CIT Screen 02/21/2020 02/19/2019 02/16/2018  What Year? 0 points 0 points 0 points  What month? 0 points 0 points 0 points  What time? 0 points 0 points 0 points  Count back from 20 0 points 0 points 0 points  Months in reverse 0 points 0 points 0 points  Repeat phrase 0 points 0 points 0 points  Total Score 0 0 0    Immunization History  Administered Date(s) Administered  . Influenza Split 10/22/2012, 09/20/2014, 09/21/2015, 08/15/2016  . Influenza Whole 10/17/2013  . Influenza, High Dose Seasonal PF 08/23/2017, 08/22/2018, 08/06/2019  . Influenza-Unspecified 09/05/2015, 08/06/2019  . PFIZER SARS-COV-2 Vaccination 01/25/2020, 02/18/2020  . Pneumococcal Conjugate-13 12/06/2007  . Pneumococcal Polysaccharide-23 11/19/2013  . Td 05/05/2010  . Zoster  04/20/2011   Screening Tests Health Maintenance  Topic Date Due  . COLONOSCOPY  04/19/2020  . TETANUS/TDAP  05/05/2020  . MAMMOGRAM  08/12/2021  . INFLUENZA VACCINE  Completed  . DEXA SCAN  Completed  . Hepatitis C Screening  Completed  . PNA vac Low Risk Adult  Completed     Plan:    Keep all routine maintenance appointments.   Follow up 03/10/20 @ 11:00  Medicare Attestation I have personally reviewed: The patient's medical and social history Their use of alcohol, tobacco or illicit drugs Their current medications and supplements The patient's functional ability including ADLs,fall risks, home safety risks, cognitive, and hearing and visual impairment Diet and physical activities Evidence for depression   I have reviewed and discussed with patient certain preventive protocols, quality metrics, and best practice recommendations.      Varney Biles, LPN  [id number]

## 2020-02-21 NOTE — Patient Instructions (Addendum)
  [name] , Thank you for taking time to come for your Medicare Wellness Visit. I appreciate your ongoing commitment to your health goals. Please review the following plan we discussed and let me know if I can assist you in the future.   These are the goals we discussed: Goals      Patient Stated   . I want to get my allergies under control (pt-stated)     Follow up with doctor and allergy specialist       This is a list of the screening recommended for you and due dates:  Health Maintenance  Topic Date Due  . Colon Cancer Screening  04/19/2020  . Tetanus Vaccine  05/05/2020  . Mammogram  08/12/2021  . Flu Shot  Completed  . DEXA scan (bone density measurement)  Completed  .  Hepatitis C: One time screening is recommended by Center for Disease Control  (CDC) for  adults born from [birth year] through 1964.   Completed  . Pneumonia vaccines  Completed

## 2020-02-25 DIAGNOSIS — M9903 Segmental and somatic dysfunction of lumbar region: Secondary | ICD-10-CM | POA: Diagnosis not present

## 2020-02-25 DIAGNOSIS — M9901 Segmental and somatic dysfunction of cervical region: Secondary | ICD-10-CM | POA: Diagnosis not present

## 2020-02-25 DIAGNOSIS — M5416 Radiculopathy, lumbar region: Secondary | ICD-10-CM | POA: Diagnosis not present

## 2020-02-25 DIAGNOSIS — M5033 Other cervical disc degeneration, cervicothoracic region: Secondary | ICD-10-CM | POA: Diagnosis not present

## 2020-02-25 NOTE — Progress Notes (Signed)
I have reviewed the above note and agree.  Montoya Watkin, M.D.  

## 2020-03-06 ENCOUNTER — Ambulatory Visit: Payer: Medicare Other | Admitting: Family Medicine

## 2020-03-10 ENCOUNTER — Other Ambulatory Visit: Payer: Self-pay

## 2020-03-10 ENCOUNTER — Other Ambulatory Visit: Payer: Self-pay | Admitting: Family Medicine

## 2020-03-10 ENCOUNTER — Encounter: Payer: Self-pay | Admitting: Family Medicine

## 2020-03-10 ENCOUNTER — Ambulatory Visit (INDEPENDENT_AMBULATORY_CARE_PROVIDER_SITE_OTHER): Payer: Medicare Other | Admitting: Family Medicine

## 2020-03-10 DIAGNOSIS — J309 Allergic rhinitis, unspecified: Secondary | ICD-10-CM | POA: Diagnosis not present

## 2020-03-10 DIAGNOSIS — F4323 Adjustment disorder with mixed anxiety and depressed mood: Secondary | ICD-10-CM | POA: Diagnosis not present

## 2020-03-10 DIAGNOSIS — G8929 Other chronic pain: Secondary | ICD-10-CM

## 2020-03-10 DIAGNOSIS — E7800 Pure hypercholesterolemia, unspecified: Secondary | ICD-10-CM | POA: Diagnosis not present

## 2020-03-10 DIAGNOSIS — J3489 Other specified disorders of nose and nasal sinuses: Secondary | ICD-10-CM | POA: Diagnosis not present

## 2020-03-10 DIAGNOSIS — M5442 Lumbago with sciatica, left side: Secondary | ICD-10-CM | POA: Diagnosis not present

## 2020-03-10 DIAGNOSIS — M5441 Lumbago with sciatica, right side: Secondary | ICD-10-CM

## 2020-03-10 DIAGNOSIS — J449 Chronic obstructive pulmonary disease, unspecified: Secondary | ICD-10-CM | POA: Diagnosis not present

## 2020-03-10 MED ORDER — LEVOCETIRIZINE DIHYDROCHLORIDE 5 MG PO TABS: 5.0000 mg | ORAL_TABLET | Freq: Every evening | ORAL | 1 refills | Status: DC

## 2020-03-10 NOTE — Assessment & Plan Note (Signed)
Depression has been improving.  She will keep her appointment with the therapist though if she is back to her baseline she will cancel this.  She will continue her current medications.

## 2020-03-10 NOTE — Assessment & Plan Note (Signed)
Continue current regimen.  Patient with an episode of atypical chest discomfort.  Unlikely cardiac given description.  She will monitor and if she has recurrent pain be evaluated.  Discussed that she could take her nitroglycerin if needed.

## 2020-03-10 NOTE — Assessment & Plan Note (Signed)
She will continue Breo.  I encouraged her to contact her pulmonologist to consider Trelegy as they discussed previously.

## 2020-03-10 NOTE — Progress Notes (Signed)
Jodi Rumps, MD Phone: [phone]  Jodi Beasley is a 74 y.o. female who presents today for follow-up.  Hyperlipidemia: Taking Lipitor and Zetia.  Patient does note 1 episode of chest discomfort that did not feel like her prior heart attack.  Lasted briefly and went away on its own.  She was sitting on the couch when that occurred.  It occurred 6 to 7 weeks ago.  Has not had any recurrence.  COPD: She does note occasional shortness of breath that typically only occurs when she is outside.  She has seen her pulmonologist and they discussed changing her to Trelegy.  She does use her Breo daily.  Depression: Patient notes she was having some issues with depression.  She has a nephew who is addicted to crack and they have had to cut him off.  That was difficult for her to deal with.  She has also had several friends pass away.  She denies SI.  She is on Wellbutrin and Lexapro.  She has an appointment with a therapist though she does note the depression has improved at this point.  Nose pain: Patient notes she is playing with her dog and her dog ran into her nose.  She has had some soreness over the bridge of her nose since then.  No bleeding.  No loss of consciousness.  Allergic rhinitis: Patient notes this has been an issue since February.  She uses Flonase and Singulair.  She tried Claritin, Zyrtec, and Allegra with little benefit.  She has an appointment for allergy testing in the next month or 2.  She has chronic postnasal drip and rhinorrhea related to this.  Chronic low back pain: This is stable.  She uses Manufacturing engineer.  This is beneficial.  No drowsiness.  No alcohol intake.  Social History   Tobacco Use  Smoking Status Current Every Day Smoker  . Packs/day: 1.00  . Years: 53.00  . Pack years: 53.00  . Types: E-cigarettes, Cigarettes  . Start date: 11/19/1960  Smokeless Tobacco Never Used  Tobacco Comment   down to 0.75ppd 1pk a day 01/30/19     ROS see history of present  illness  Objective  Physical Exam Vitals:   03/10/20 1102  BP: 120/80  Pulse: 73  Temp: (!) 96.1 F (35.6 C)  SpO2: 98%    BP Readings from Last 3 Encounters:  03/10/20 120/80  12/18/19 136/74  10/22/19 124/80   Wt Readings from Last 3 Encounters:  03/10/20 155 lb 9.6 oz (70.6 kg)  02/21/20 159 lb (72.1 kg)  12/18/19 159 lb 6.4 oz (72.3 kg)    Physical Exam Constitutional:      General: She is not in acute distress.    Appearance: She is not diaphoretic.  HENT:     Nose:     Comments: No orbital tenderness or defects palpated, no nasal bone defects palpated, slight tenderness over the bridge of her nose Cardiovascular:     Rate and Rhythm: Normal rate and regular rhythm.     Heart sounds: Normal heart sounds.  Pulmonary:     Effort: Pulmonary effort is normal.     Breath sounds: Normal breath sounds.  Skin:    General: Skin is warm and dry.  Neurological:     Mental Status: She is alert.      Assessment/Plan: Please see individual problem list.  COPD (chronic obstructive pulmonary disease) with chronic bronchitis ([place]) She will continue Breo.  I encouraged her to contact her pulmonologist to consider  Trelegy as they discussed previously.  Allergic rhinitis Chronic issues.  We will add Xyzal.  She will continue Flonase and Singulair.  She will complete allergy testing as scheduled.  Nose pain Suspect soft tissue injury though did offer x-ray to evaluate for fracture.  She deferred this at this time.  If she continues to have discomfort she will let us know and we can perform an x-ray.  Chronic low back pain (Primary Area of Pain) (Bilateral) (L>R) Stable.  Continue Soma.  Adjustment disorder with mixed anxiety and depressed mood Depression has been improving.  She will keep her appointment with the therapist though if she is back to her baseline she will cancel this.  She will continue her current medications.  Hypercholesterolemia Continue current  regimen.  Patient with an episode of atypical chest discomfort.  Unlikely cardiac given description.  She will monitor and if she has recurrent pain be evaluated.  Discussed that she could take her nitroglycerin if needed.    No orders of the defined types were placed in this encounter.   Meds ordered this encounter  Medications  . levocetirizine (XYZAL) 5 MG tablet    Sig: Take 1 tablet (5 mg total) by mouth every evening.    Dispense:  30 tablet    Refill:  1    This visit occurred during the SARS-CoV-2 public health emergency.  Safety protocols were in place, including screening questions prior to the visit, additional usage of staff PPE, and extensive cleaning of exam room while observing appropriate contact time as indicated for disinfecting solutions.    Jodi Rumps, MD [place]

## 2020-03-10 NOTE — Assessment & Plan Note (Signed)
Suspect soft tissue injury though did offer x-ray to evaluate for fracture.  She deferred this at this time.  If she continues to have discomfort she will let us know and we can perform an x-ray.

## 2020-03-10 NOTE — Patient Instructions (Signed)
Nice to see you. Please try the xyzal.  Please seek medical attention if you have any further chest pain.  Please monitor your nose. If the discomfort does not improve please let us know and we can get an x-ray.

## 2020-03-10 NOTE — Assessment & Plan Note (Signed)
Chronic issues.  We will add Xyzal.  She will continue Flonase and Singulair.  She will complete allergy testing as scheduled.

## 2020-03-10 NOTE — Assessment & Plan Note (Signed)
Stable.  Continue Soma.

## 2020-03-17 DIAGNOSIS — M5033 Other cervical disc degeneration, cervicothoracic region: Secondary | ICD-10-CM | POA: Diagnosis not present

## 2020-03-17 DIAGNOSIS — M9901 Segmental and somatic dysfunction of cervical region: Secondary | ICD-10-CM | POA: Diagnosis not present

## 2020-03-17 DIAGNOSIS — M5416 Radiculopathy, lumbar region: Secondary | ICD-10-CM | POA: Diagnosis not present

## 2020-03-17 DIAGNOSIS — M9903 Segmental and somatic dysfunction of lumbar region: Secondary | ICD-10-CM | POA: Diagnosis not present

## 2020-03-18 ENCOUNTER — Telehealth: Payer: Self-pay | Admitting: Emergency Medicine

## 2020-03-18 DIAGNOSIS — J309 Allergic rhinitis, unspecified: Secondary | ICD-10-CM

## 2020-03-18 NOTE — Telephone Encounter (Signed)
Spoke with pt, she has been sick since the pollen surfaced. She saw her primary care doctor and he put her on Xyzal for her allergies but she is still having allergy symptoms. She is SOB, coughing, runny nose, red eyes, sneezing, and PND. Her cough is dry but sometimes some clear phlegm comes up. She is also on Flonase for allergies but nothing is helping. She doesn't know which way to go. Should she see [place], an allergist? Please advise.    Call pt after 1pm.

## 2020-03-19 NOTE — Telephone Encounter (Signed)
Called and spoke with pt letting her know the info stated by RB. Pt stated she would like to have a referral to allergist so referral has been placed. Nothing further needed.

## 2020-03-19 NOTE — Telephone Encounter (Signed)
I believe she should try nasal saline rinses if she isn't already doing. Also, I am Ok with an allergy referral if she would like. Thanks.

## 2020-03-23 ENCOUNTER — Ambulatory Visit: Payer: PRIVATE HEALTH INSURANCE | Admitting: Psychology

## 2020-03-24 ENCOUNTER — Ambulatory Visit (INDEPENDENT_AMBULATORY_CARE_PROVIDER_SITE_OTHER): Payer: Medicare Other | Admitting: Allergy and Immunology

## 2020-03-24 ENCOUNTER — Other Ambulatory Visit: Payer: Self-pay | Admitting: Allergy and Immunology

## 2020-03-24 ENCOUNTER — Other Ambulatory Visit: Payer: Self-pay

## 2020-03-24 ENCOUNTER — Encounter: Payer: Self-pay | Admitting: Allergy and Immunology

## 2020-03-24 VITALS — BP 160/100 | HR 76 | Temp 97.5°F | Resp 16 | Ht 65.0 in | Wt 157.2 lb

## 2020-03-24 DIAGNOSIS — J3089 Other allergic rhinitis: Secondary | ICD-10-CM

## 2020-03-24 DIAGNOSIS — J449 Chronic obstructive pulmonary disease, unspecified: Secondary | ICD-10-CM

## 2020-03-24 DIAGNOSIS — H1010 Acute atopic conjunctivitis, unspecified eye: Secondary | ICD-10-CM

## 2020-03-24 DIAGNOSIS — J301 Allergic rhinitis due to pollen: Secondary | ICD-10-CM

## 2020-03-24 DIAGNOSIS — F17210 Nicotine dependence, cigarettes, uncomplicated: Secondary | ICD-10-CM | POA: Diagnosis not present

## 2020-03-24 DIAGNOSIS — J4489 Other specified chronic obstructive pulmonary disease: Secondary | ICD-10-CM

## 2020-03-24 MED ORDER — BREO ELLIPTA 100-25 MCG/INH IN AEPB: 1.0000 | INHALATION_SPRAY | Freq: Every day | RESPIRATORY_TRACT | 5 refills | Status: DC

## 2020-03-24 MED ORDER — MONTELUKAST SODIUM 10 MG PO TABS: ORAL_TABLET | ORAL | 3 refills | Status: DC

## 2020-03-24 MED ORDER — AZELASTINE-FLUTICASONE 137-50 MCG/ACT NA SUSP: NASAL | 5 refills | Status: DC

## 2020-03-24 MED ORDER — CETIRIZINE HCL 10 MG PO TABS: 10.0000 mg | ORAL_TABLET | Freq: Every day | ORAL | 5 refills | Status: DC

## 2020-03-24 MED ORDER — ALBUTEROL SULFATE HFA 108 (90 BASE) MCG/ACT IN AERS: 2.0000 | INHALATION_SPRAY | Freq: Four times a day (QID) | RESPIRATORY_TRACT | 1 refills | Status: DC | PRN

## 2020-03-24 NOTE — Progress Notes (Signed)
[place] - [place] - [place] - [place] - [place]   Dear Dr. Lamonte Sakai,  Thank you for referring Jodi Beasley to the [facility] on 03/24/2020.   Below is a summation of this patient's evaluation and recommendations.  Thank you for your referral. I will keep you informed about this patient's response to treatment.   If you have any questions please do not hesitate to contact me.   Sincerely,  Jiles Prows, MD Allergy / Immunology [place]   ______________________________________________________________________    NEW PATIENT NOTE  Referring Provider: Collene Gobble, MD Primary Provider: Leone Haven, MD Date of office visit: 03/24/2020    Subjective:   Chief Complaint:  Jodi Beasley (DOB: July 04, 1946) is a 74 y.o. female who presents to the clinic on 03/24/2020 with a chief complaint of Allergic Rhinitis  .     HPI: Jodi Beasley presents to this clinic in evaluation of allergies.  She has a long history of itchy eyes and runny nose and sneezing and nasal congestion and some slight cough and scratchy throat and postnasal drip and throat clearing that occurs on a perennial basis but flares during the spring of many years duration.  This past March was a particularly bad period of the year.  She has been consistently using Flonase and Zyrtec yet still continues to have the symptoms.  She has a history of COPD secondary to extensive tobacco use.  She has some coughing and shortness of breath.  This usually occurs when she mows the lawn and when she vacuums.  She has been using Breo on a daily basis and her requirement for short acting bronchodilators is about 3 times per month.  She does have reflux which is a mid chest burning which is completely controlled with omeprazole.  She does drink coffee and tea throughout the day.  She has received 2 Covid vaccinations.  Past  Medical History:  Diagnosis Date  . Arrhythmia   . Chicken pox   . COPD (chronic obstructive pulmonary disease) ([place])   . Coronary artery disease    NSTEMI in 09/2008. Cath : 80% RCA and 95% first diagonal. PCI and 2 DES placement  RCA and 1 DES to diagonal. Complicated by acute diagonal stent thrombosis . Treated by thrombectomy. Most recent nuclear stress test in 2010 showed no ischemia with normal EF.   Marland Kitchen Depression   . GERD (gastroesophageal reflux disease)   . Hay fever   . History of blood transfusion   . History of hypercholesterolemia   . History of syncope 2000   negative EP study  . Hyperlipidemia    intolerance to statins except Crestor.   . Hypertension   . Hypothyroid   . MI (myocardial infarction) ([place])   . Pneumonia, organism unspecified(486)   . Sepsis ([place]) 04/27/2018    Past Surgical History:  Procedure Laterality Date  . CORONARY ANGIOPLASTY WITH STENT PLACEMENT  2009   [facility], drug-eluting mid RCA,prox diag;  . TONSILLECTOMY    . TUBAL LIGATION      Allergies as of 03/24/2020      Reactions   Benzocaine    Tongue swelling   Darvon [propoxyphene Hcl]    HA   Monosodium Glutamate Diarrhea   Neosporin [neomycin-bacitracin Zn-polymyx]    blisters   Nicoderm [nicotine]    arrythmia   Prednisone    Cannot tolerate high doses per patient- caused depressive thoughts  Medication List      aspirin 81 MG tablet Take 81 mg by mouth daily.   atorvastatin 80 MG tablet Commonly known as: LIPITOR TAKE 1 TABLET(80 MG) BY MOUTH DAILY   Breo Ellipta 100-25 MCG/INH Aepb Generic drug: fluticasone furoate-vilanterol Inhale 1 puff into the lungs daily.   buPROPion 150 MG 24 hr tablet Commonly known as: WELLBUTRIN XL TAKE 1 TABLET(150 MG) BY MOUTH DAILY   carisoprodol 350 MG tablet Commonly known as: SOMA TAKE 1 TABLET BY MOUTH TWICE DAILY AS NEEDED FOR MUSCLE SPASMS   CoQ10 400 MG Caps Take 400 mg by mouth daily.   escitalopram 10 MG  tablet Commonly known as: LEXAPRO TAKE 1 TABLET BY MOUTH EVERY DAY   ezetimibe 10 MG tablet Commonly known as: ZETIA TAKE 1 TABLET(10 MG) BY MOUTH DAILY   fluticasone 50 MCG/ACT nasal spray Commonly known as: FLONASE SHAKE LIQUID AND USE 2 SPRAYS IN EACH NOSTRIL DAILY   Fluzone High-Dose Quadrivalent 0.7 ML Susy Generic drug: Influenza Vac High-Dose Quad ADM 0.7ML IM UTD   glucosamine-chondroitin 500-400 MG tablet Take 1 tablet by mouth 2 (two) times daily.   levocetirizine 5 MG tablet Commonly known as: XYZAL TAKE 1 TABLET(5 MG) BY MOUTH EVERY EVENING   levothyroxine 100 MCG tablet Commonly known as: SYNTHROID TAKE 1 TABLET(100 MCG) BY MOUTH DAILY BEFORE BREAKFAST   METAMUCIL FIBER PO Take 1 Package by mouth daily as needed.   metoprolol tartrate 25 MG tablet Commonly known as: LOPRESSOR Take 1 tablet (25 mg total) by mouth 2 (two) times daily.   montelukast 10 MG tablet Commonly known as: SINGULAIR TAKE 1 TABLET(10 MG) BY MOUTH AT BEDTIME   multivitamin capsule Take 1 capsule by mouth daily.   mupirocin ointment 2 % Commonly known as: Bactroban Apply 1 application topically 2 (two) times daily as needed.   nitroGLYCERIN 0.4 MG SL tablet Commonly known as: NITROSTAT Place 1 tablet (0.4 mg total) under the tongue every 5 (five) minutes as needed.   omeprazole 20 MG capsule Commonly known as: PRILOSEC TAKE 1 CAPSULE BY MOUTH EVERY DAY   Vitamin D-3 25 MCG (1000 UT) Caps Take 1 capsule by mouth daily.       Review of systems negative except as noted in HPI / PMHx or noted below:  Review of Systems  Constitutional: Negative.   HENT: Negative.   Eyes: Negative.   Respiratory: Negative.   Cardiovascular: Negative.   Gastrointestinal: Negative.   Genitourinary: Negative.   Musculoskeletal: Negative.   Skin: Negative.   Neurological: Negative.   Endo/Heme/Allergies: Negative.   Psychiatric/Behavioral: Negative.     Family History  Problem Relation  Age of Onset  . Colon cancer Father   . Lung cancer Father        was a former smoker  . Cancer Father        lung  . Diabetes Father   . Hypertension Mother   . Hypertension Other   . Diabetes Other   . Allergic rhinitis Neg Hx   . Angioedema Neg Hx   . Asthma Neg Hx   . Atopy Neg Hx   . Eczema Neg Hx   . Immunodeficiency Neg Hx   . Urticaria Neg Hx     Social History   Socioeconomic History  . Marital status: Widowed    Spouse name: Not on file  . Number of children: Not on file  . Years of education: Not on file  . Highest education level: Not on  file  Occupational History  . Not on file  Tobacco Use  . Smoking status: Current Every Day Smoker    Packs/day: 1.00    Years: 53.00    Pack years: 53.00    Types: E-cigarettes, Cigarettes    Start date: 11/19/1960  . Smokeless tobacco: Never Used  . Tobacco comment: down to 0.75ppd 1pk a day 01/30/19  Substance and Sexual Activity  . Alcohol use: Yes    Alcohol/week: 1.0 standard drinks    Types: 1 Standard drinks or equivalent per week  . Drug use: No  . Sexual activity: Yes  Other Topics Concern  . Not on file  Social History Narrative   Lives with mother in [place]. 2 cats 2 dogs in home.   Recently moved from [facility].   Custody of 38month old.    Environmental and Social history  Lives in a condominium with a dry environment, 2 dogs located inside the household, carpet in the bedroom, plastic on the bed, plastic on the pillow, actively smoking tobacco products or rate of 15 cigarettes/day for the past 50 years.  Objective:   Vitals:   03/24/20 1422  BP: (!) 160/100  Pulse: 76  Resp: 16  Temp: (!) 97.5 F (36.4 C)  SpO2: 96%   Height: 5\' 5"  (165.1 cm) Weight: 157 lb 3.2 oz (71.3 kg)  Physical Exam Constitutional:      Appearance: She is not diaphoretic.     Comments: Raspy voice  HENT:     Head: Normocephalic. No right periorbital erythema or left periorbital erythema.     Right Ear:  Tympanic membrane, ear canal and external ear normal.     Left Ear: Tympanic membrane, ear canal and external ear normal.     Nose: Nose normal. No mucosal edema or rhinorrhea.     Mouth/Throat:     Pharynx: No oropharyngeal exudate.  Eyes:     General: Lids are normal.     Conjunctiva/sclera: Conjunctivae normal.     Pupils: Pupils are equal, round, and reactive to light.  Neck:     Thyroid: No thyromegaly.     Trachea: Trachea normal. No tracheal deviation.  Cardiovascular:     Rate and Rhythm: Normal rate and regular rhythm.     Heart sounds: Normal heart sounds, S1 normal and S2 normal. No murmur.  Pulmonary:     Effort: Pulmonary effort is normal. No respiratory distress.     Breath sounds: No stridor. No wheezing or rales.  Chest:     Chest wall: No tenderness.  Abdominal:     General: There is no distension.     Palpations: Abdomen is soft. There is no mass.     Tenderness: There is no abdominal tenderness. There is no guarding or rebound.  Musculoskeletal:        General: No tenderness.  Lymphadenopathy:     Head:     Right side of head: No tonsillar adenopathy.     Left side of head: No tonsillar adenopathy.     Cervical: No cervical adenopathy.  Skin:    Coloration: Skin is not pale.     Findings: No erythema or rash.     Nails: There is no clubbing.  Neurological:     Mental Status: She is alert.     Diagnostics: Allergy skin tests were performed.  She demonstrated hypersensitivity to wheat, tree, and mold.  Spirometry was performed and demonstrated an FEV1 of 1.50 @ 65 % of predicted.  FEV1/FVC = 0.60  Assessment and Plan:    1. Perennial allergic rhinitis   2. Seasonal allergic rhinitis due to pollen   3. Seasonal allergic conjunctivitis   4. COPD with asthma ([place])   5. Heavy tobacco smoker >10 cigarettes per day     1.  Allergen avoidance measures - pollen / mold  2.  Use nicotine substitutes to replace tobacco smoke exposure  3.  Treat and  prevent inflammation:   A.  Continue Breo 100 -1 inhalation 1 time per day  B.  Dymista -1 spray each nostril twice a day  C.  Montelukast 10 mg -1 tablet 1 time per day  4.  If needed:   A.  Cetirizine 10 mg - 1-2 tablets 1-2 times per day (max = 40 mg /day)  B.  OTC Pataday -1 drop each eye 1 time per day  C.  Nasal saline   D.  Albuterol HFA -2 inhalations every 4-6 hours  5.  Immunotherapy?  6.  Return to clinic in 4 weeks or earlier if problem  Jodi Beasley appears to have a inflamed and irritated respiratory tract and part of this issue is secondary to her atopic respiratory disease and part of this issue is secondary to tobacco smoke exposure.  I made an attempt to convince her to perform allergen avoidance measures and use nicotine substitutes to replace her tobacco smoke exposure.  She will utilize a collection of anti-inflammatory agents for her airway as noted above.  She would be a candidate for immunotherapy if she fails this form of treatment.  I will see her back in this clinic in 4 weeks or earlier if there is a problem.  Jiles Prows, MD Allergy / Immunology [facility]

## 2020-03-24 NOTE — Patient Instructions (Addendum)
  1.  Allergen avoidance measures - pollen / mold  2.  Use nicotine substitutes to replace tobacco smoke exposure  3.  Treat and prevent inflammation:   A.  Continue Breo 100 -1 inhalation 1 time per day  B.  Dymista -1 spray each nostril twice a day  C.  Montelukast 10 mg -1 tablet 1 time per day  4.  If needed:   A.  Cetirizine 10 mg - 1-2 tablets 1-2 times per day (max = 40 mg /day)  B.  OTC Pataday -1 drop each eye 1 time per day  C.  Nasal saline   D.  Albuterol HFA -2 inhalations every 4-6 hours  5.  Immunotherapy?  6.  Return to clinic in 4 weeks or earlier if problem

## 2020-03-25 ENCOUNTER — Encounter: Payer: Self-pay | Admitting: Allergy and Immunology

## 2020-04-01 ENCOUNTER — Other Ambulatory Visit: Payer: Self-pay | Admitting: Family Medicine

## 2020-04-01 NOTE — Telephone Encounter (Signed)
Refill request for Jodi Beasley, last seen 03-10-20, last filled 02-20-20.  Please advise.

## 2020-04-03 ENCOUNTER — Other Ambulatory Visit: Payer: Self-pay | Admitting: Family Medicine

## 2020-04-06 ENCOUNTER — Other Ambulatory Visit: Payer: Self-pay | Admitting: Family Medicine

## 2020-04-14 DIAGNOSIS — M5033 Other cervical disc degeneration, cervicothoracic region: Secondary | ICD-10-CM | POA: Diagnosis not present

## 2020-04-14 DIAGNOSIS — M5416 Radiculopathy, lumbar region: Secondary | ICD-10-CM | POA: Diagnosis not present

## 2020-04-14 DIAGNOSIS — M9901 Segmental and somatic dysfunction of cervical region: Secondary | ICD-10-CM | POA: Diagnosis not present

## 2020-04-14 DIAGNOSIS — M9903 Segmental and somatic dysfunction of lumbar region: Secondary | ICD-10-CM | POA: Diagnosis not present

## 2020-04-15 ENCOUNTER — Other Ambulatory Visit: Payer: Self-pay | Admitting: Family Medicine

## 2020-04-15 DIAGNOSIS — Z1231 Encounter for screening mammogram for malignant neoplasm of breast: Secondary | ICD-10-CM

## 2020-04-17 ENCOUNTER — Telehealth: Payer: Self-pay | Admitting: Allergy and Immunology

## 2020-04-17 MED ORDER — CETIRIZINE HCL 10 MG PO TABS
ORAL_TABLET | ORAL | 5 refills | Status: DC
Start: 2020-04-17 — End: 2021-01-05

## 2020-04-17 NOTE — Telephone Encounter (Signed)
PT called was told she can take cetirizine 10mg  up to 2x daily and she has been taking 5mg  3x daily. PT requesting new rx to be sent back to [facility] so she does not run out of medication

## 2020-04-17 NOTE — Telephone Encounter (Signed)
New prescription has been sent in. Called patient and advised and stated that there may need to be  a prior authorization done for the medication. Patient verbalized understanding.

## 2020-04-23 DIAGNOSIS — E871 Hypo-osmolality and hyponatremia: Secondary | ICD-10-CM | POA: Diagnosis not present

## 2020-04-23 DIAGNOSIS — N2581 Secondary hyperparathyroidism of renal origin: Secondary | ICD-10-CM | POA: Diagnosis not present

## 2020-04-23 DIAGNOSIS — N1832 Chronic kidney disease, stage 3b: Secondary | ICD-10-CM | POA: Diagnosis not present

## 2020-04-23 DIAGNOSIS — I129 Hypertensive chronic kidney disease with stage 1 through stage 4 chronic kidney disease, or unspecified chronic kidney disease: Secondary | ICD-10-CM | POA: Diagnosis not present

## 2020-04-23 DIAGNOSIS — R809 Proteinuria, unspecified: Secondary | ICD-10-CM | POA: Diagnosis not present

## 2020-04-23 DIAGNOSIS — E872 Acidosis: Secondary | ICD-10-CM | POA: Diagnosis not present

## 2020-04-28 ENCOUNTER — Encounter: Payer: Self-pay | Admitting: Allergy and Immunology

## 2020-04-28 ENCOUNTER — Other Ambulatory Visit: Payer: Self-pay

## 2020-04-28 ENCOUNTER — Ambulatory Visit (INDEPENDENT_AMBULATORY_CARE_PROVIDER_SITE_OTHER): Payer: Medicare Other | Admitting: Allergy and Immunology

## 2020-04-28 VITALS — BP 130/80 | HR 68 | Temp 97.9°F | Resp 16

## 2020-04-28 DIAGNOSIS — H1010 Acute atopic conjunctivitis, unspecified eye: Secondary | ICD-10-CM

## 2020-04-28 DIAGNOSIS — J449 Chronic obstructive pulmonary disease, unspecified: Secondary | ICD-10-CM

## 2020-04-28 DIAGNOSIS — J3089 Other allergic rhinitis: Secondary | ICD-10-CM | POA: Diagnosis not present

## 2020-04-28 DIAGNOSIS — J301 Allergic rhinitis due to pollen: Secondary | ICD-10-CM

## 2020-04-28 DIAGNOSIS — J4489 Other specified chronic obstructive pulmonary disease: Secondary | ICD-10-CM

## 2020-04-28 DIAGNOSIS — F17210 Nicotine dependence, cigarettes, uncomplicated: Secondary | ICD-10-CM | POA: Diagnosis not present

## 2020-04-28 NOTE — Progress Notes (Signed)
[place] - [place] - [place]   Follow-up Note  Referring Provider: Leone Haven, MD Primary Provider: Leone Haven, MD Date of Office Visit: 04/28/2020  Subjective:   Jodi Beasley (DOB: [birth year]) is a 74 y.o. female who returns to the [facility] on 04/28/2020 in re-evaluation of the following:  HPI: Jodi Beasley presents to this clinic in evaluation of allergic rhinoconjunctivitis and history of COPD with asthma and heavy tobacco smoking.  Her last visit to this clinic was her initial evaluation of 24 March 2020.  She has noticed a very good improvement regarding all of her respiratory and eye issues while utilizing the therapy established during her initial evaluation with includes the introduction of nasal steroids and nasal antihistamines and leukotriene modifier and trying her best at allergen avoidance measures while she also continues to utilize her Breo.  She has resolved almost all of her sneezing and throat clearing and postnasal drip and cough.  She continues to smoke approximately 1 pack/day.  She has experienced the recent death of several close friends and she is very stressed and she is using nicotine for stress reduction.  She will be entering into counseling within the next week or 2.  Allergies as of 04/28/2020      Reactions   Benzocaine    Tongue swelling   Darvon [propoxyphene Hcl]    HA   Monosodium Glutamate Diarrhea   Neosporin [neomycin-bacitracin Zn-polymyx]    blisters   Nicoderm [nicotine]    arrythmia   Prednisone    Cannot tolerate high doses per patient- caused depressive thoughts      Medication List      albuterol 108 (90 Base) MCG/ACT inhaler Commonly known as: VENTOLIN HFA INHALE 2 PUFFS INTO THE LUNGS EVERY 6 HOURS AS NEEDED FOR WHEEZING OR SHORTNESS OF BREATH   aspirin 81 MG tablet Take 81 mg by mouth daily.   atorvastatin 80 MG tablet Commonly known as: LIPITOR TAKE 1 TABLET(80  MG) BY MOUTH DAILY   Azelastine-Fluticasone 137-50 MCG/ACT Susp 1 spray each nostril twice a day   Breo Ellipta 100-25 MCG/INH Aepb Generic drug: fluticasone furoate-vilanterol Inhale 1 puff into the lungs daily.   buPROPion 150 MG 24 hr tablet Commonly known as: WELLBUTRIN XL TAKE 1 TABLET(150 MG) BY MOUTH DAILY   carisoprodol 350 MG tablet Commonly known as: SOMA TAKE 1 TABLET BY MOUTH TWICE DAILY AS NEEDED FOR MUSCLE SPASMS   cetirizine 10 MG tablet Commonly known as: ZYRTEC Take 1 tablet (10 mg total) by mouth daily.   cetirizine 10 MG tablet Commonly known as: ZYRTEC Take 1-2 tablets 1-2 times daily as needed.   CoQ10 400 MG Caps Take 400 mg by mouth daily.   escitalopram 10 MG tablet Commonly known as: LEXAPRO TAKE 1 TABLET BY MOUTH EVERY DAY   ezetimibe 10 MG tablet Commonly known as: ZETIA TAKE 1 TABLET(10 MG) BY MOUTH DAILY   fluticasone 50 MCG/ACT nasal spray Commonly known as: FLONASE SHAKE LIQUID AND USE 2 SPRAYS IN EACH NOSTRIL DAILY   Fluzone High-Dose Quadrivalent 0.7 ML Susy Generic drug: Influenza Vac High-Dose Quad ADM 0.7ML IM UTD   glucosamine-chondroitin 500-400 MG tablet Take 1 tablet by mouth 2 (two) times daily.   levocetirizine 5 MG tablet Commonly known as: XYZAL TAKE 1 TABLET(5 MG) BY MOUTH EVERY EVENING   levothyroxine 100 MCG tablet Commonly known as: SYNTHROID TAKE 1 TABLET(100 MCG) BY MOUTH DAILY BEFORE BREAKFAST   METAMUCIL FIBER  PO Take 1 Package by mouth daily as needed.   metoprolol tartrate 25 MG tablet Commonly known as: LOPRESSOR Take 1 tablet (25 mg total) by mouth 2 (two) times daily.   montelukast 10 MG tablet Commonly known as: SINGULAIR TAKE 1 TABLET(10 MG) BY MOUTH AT BEDTIME   multivitamin capsule Take 1 capsule by mouth daily.   mupirocin ointment 2 % Commonly known as: Bactroban Apply 1 application topically 2 (two) times daily as needed.   nitroGLYCERIN 0.4 MG SL tablet Commonly known as:  NITROSTAT Place 1 tablet (0.4 mg total) under the tongue every 5 (five) minutes as needed.   omeprazole 20 MG capsule Commonly known as: PRILOSEC TAKE 1 CAPSULE BY MOUTH EVERY DAY   Vitamin D-3 25 MCG (1000 UT) Caps Take 1 capsule by mouth daily.       Past Medical History:  Diagnosis Date  . Arrhythmia   . Chicken pox   . COPD (chronic obstructive pulmonary disease) ([place])   . Coronary artery disease    NSTEMI in 09/2008. Cath : 80% RCA and 95% first diagonal. PCI and 2 DES placement  RCA and 1 DES to diagonal. Complicated by acute diagonal stent thrombosis . Treated by thrombectomy. Most recent nuclear stress test in 2010 showed no ischemia with normal EF.   Marland Kitchen Depression   . GERD (gastroesophageal reflux disease)   . Hay fever   . History of blood transfusion   . History of hypercholesterolemia   . History of syncope 2000   negative EP study  . Hyperlipidemia    intolerance to statins except Crestor.   . Hypertension   . Hypothyroid   . MI (myocardial infarction) ([place])   . Pneumonia, organism unspecified(486)   . Sepsis ([place]) 04/27/2018    Past Surgical History:  Procedure Laterality Date  . CORONARY ANGIOPLASTY WITH STENT PLACEMENT  2009   [facility], drug-eluting mid RCA,prox diag;  . TONSILLECTOMY    . TUBAL LIGATION      Review of systems negative except as noted in HPI / PMHx or noted below:  Review of Systems  Constitutional: Negative.   HENT: Negative.   Eyes: Negative.   Respiratory: Negative.   Cardiovascular: Negative.   Gastrointestinal: Negative.   Genitourinary: Negative.   Musculoskeletal: Negative.   Skin: Negative.   Neurological: Negative.   Endo/Heme/Allergies: Negative.   Psychiatric/Behavioral: Negative.      Objective:   Vitals:   04/28/20 1327  BP: 130/80  Pulse: 68  Resp: 16  Temp: 97.9 F (36.6 C)  SpO2: 96%          Physical Exam Constitutional:      Appearance: She is not diaphoretic.  HENT:     Head:  Normocephalic.     Right Ear: Tympanic membrane, ear canal and external ear normal.     Left Ear: Tympanic membrane, ear canal and external ear normal.     Nose: Nose normal. No mucosal edema or rhinorrhea.     Mouth/Throat:     Pharynx: Uvula midline. No oropharyngeal exudate.  Eyes:     Conjunctiva/sclera: Conjunctivae normal.  Neck:     Thyroid: No thyromegaly.     Trachea: Trachea normal. No tracheal tenderness or tracheal deviation.  Cardiovascular:     Rate and Rhythm: Normal rate and regular rhythm.     Heart sounds: Normal heart sounds, S1 normal and S2 normal. No murmur.  Pulmonary:     Effort: No respiratory distress.     Breath  sounds: Normal breath sounds. No stridor. No wheezing or rales.  Lymphadenopathy:     Head:     Right side of head: No tonsillar adenopathy.     Left side of head: No tonsillar adenopathy.     Cervical: No cervical adenopathy.  Skin:    Findings: No erythema or rash.     Nails: There is no clubbing.  Neurological:     Mental Status: She is alert.     Diagnostics:    Spirometry was performed and demonstrated an FEV1 of 1.65 at 73 % of predicted.  Assessment and Plan:   1. Perennial allergic rhinitis   2. Seasonal allergic rhinitis due to pollen   3. Seasonal allergic conjunctivitis   4. COPD with asthma ([place])   5. Heavy tobacco smoker >10 cigarettes per day     1.  Allergen avoidance measures - pollen / mold  2.  Use nicotine substitutes to replace tobacco smoke exposure  3.  Treat and prevent inflammation:   A.  Continue Breo 100 -1 inhalation 1 time per day  B.  Dymista -1 spray each nostril twice a day  C.  Montelukast 10 mg -1 tablet 1 time per day  4.  If needed:   A.  Cetirizine 10 mg - 1-2 tablets 1-2 times per day (max = 40 mg /day)  B.  OTC Pataday -1 drop each eye 1 time per day  C.  Nasal saline   D.  Albuterol HFA -2 inhalations every 4-6 hours  5.  Return to clinic in 6 months or earlier if problem  [facility]  appears to be doing pretty good at this point in time and we will continue to have her use a collection of anti-inflammatory agents for her airway.  I encouraged her once again to attempt to replace her tobacco smoke exposure with a nonsmoking form of nicotine to address her stress reduction.  Hopefully when she goes into counseling for her stress there will be an opportunity for her to eliminate her tobacco smoke exposure.  I will see her back in his clinic in 6 months or earlier if there is a problem.  Allena Katz, MD Allergy / Immunology [facility]

## 2020-04-28 NOTE — Patient Instructions (Addendum)
  1.  Allergen avoidance measures - pollen / mold  2.  Use nicotine substitutes to replace tobacco smoke exposure  3.  Treat and prevent inflammation:   A.  Continue Breo 100 -1 inhalation 1 time per day  B.  Dymista -1 spray each nostril twice a day  C.  Montelukast 10 mg -1 tablet 1 time per day  4.  If needed:   A.  Cetirizine 10 mg - 1-2 tablets 1-2 times per day (max = 40 mg /day)  B.  OTC Pataday -1 drop each eye 1 time per day  C.  Nasal saline   D.  Albuterol HFA -2 inhalations every 4-6 hours  5.  Return to clinic in 6 months or earlier if problem

## 2020-04-29 ENCOUNTER — Encounter: Payer: Self-pay | Admitting: Allergy and Immunology

## 2020-04-29 DIAGNOSIS — N1832 Chronic kidney disease, stage 3b: Secondary | ICD-10-CM | POA: Diagnosis not present

## 2020-04-29 DIAGNOSIS — R809 Proteinuria, unspecified: Secondary | ICD-10-CM | POA: Diagnosis not present

## 2020-04-29 DIAGNOSIS — I129 Hypertensive chronic kidney disease with stage 1 through stage 4 chronic kidney disease, or unspecified chronic kidney disease: Secondary | ICD-10-CM | POA: Diagnosis not present

## 2020-04-29 DIAGNOSIS — N2581 Secondary hyperparathyroidism of renal origin: Secondary | ICD-10-CM | POA: Diagnosis not present

## 2020-05-01 ENCOUNTER — Ambulatory Visit (INDEPENDENT_AMBULATORY_CARE_PROVIDER_SITE_OTHER): Payer: Medicare Other | Admitting: Psychologist

## 2020-05-01 DIAGNOSIS — Z634 Disappearance and death of family member: Secondary | ICD-10-CM | POA: Diagnosis not present

## 2020-05-01 DIAGNOSIS — F411 Generalized anxiety disorder: Secondary | ICD-10-CM

## 2020-05-11 ENCOUNTER — Other Ambulatory Visit: Payer: Self-pay | Admitting: Family Medicine

## 2020-05-11 NOTE — Telephone Encounter (Signed)
Refill request for SOMA, last seen 03-10-20, last filled 04-02-20.  Please advise.

## 2020-05-12 DIAGNOSIS — M9903 Segmental and somatic dysfunction of lumbar region: Secondary | ICD-10-CM | POA: Diagnosis not present

## 2020-05-12 DIAGNOSIS — M9901 Segmental and somatic dysfunction of cervical region: Secondary | ICD-10-CM | POA: Diagnosis not present

## 2020-05-12 DIAGNOSIS — M5033 Other cervical disc degeneration, cervicothoracic region: Secondary | ICD-10-CM | POA: Diagnosis not present

## 2020-05-12 DIAGNOSIS — M5416 Radiculopathy, lumbar region: Secondary | ICD-10-CM | POA: Diagnosis not present

## 2020-05-14 ENCOUNTER — Other Ambulatory Visit: Payer: Self-pay | Admitting: Family Medicine

## 2020-05-22 ENCOUNTER — Ambulatory Visit (INDEPENDENT_AMBULATORY_CARE_PROVIDER_SITE_OTHER): Payer: Medicare Other | Admitting: Psychologist

## 2020-05-22 DIAGNOSIS — Z634 Disappearance and death of family member: Secondary | ICD-10-CM | POA: Diagnosis not present

## 2020-05-22 DIAGNOSIS — F411 Generalized anxiety disorder: Secondary | ICD-10-CM | POA: Diagnosis not present

## 2020-05-28 ENCOUNTER — Encounter: Payer: Self-pay | Admitting: Emergency Medicine

## 2020-05-28 ENCOUNTER — Ambulatory Visit (INDEPENDENT_AMBULATORY_CARE_PROVIDER_SITE_OTHER): Payer: Medicare Other | Admitting: Emergency Medicine

## 2020-05-28 ENCOUNTER — Other Ambulatory Visit: Payer: Self-pay

## 2020-05-28 DIAGNOSIS — J309 Allergic rhinitis, unspecified: Secondary | ICD-10-CM | POA: Diagnosis not present

## 2020-05-28 DIAGNOSIS — F17210 Nicotine dependence, cigarettes, uncomplicated: Secondary | ICD-10-CM | POA: Diagnosis not present

## 2020-05-28 DIAGNOSIS — J449 Chronic obstructive pulmonary disease, unspecified: Secondary | ICD-10-CM | POA: Diagnosis not present

## 2020-05-28 DIAGNOSIS — Z720 Tobacco use: Secondary | ICD-10-CM

## 2020-05-28 NOTE — Assessment & Plan Note (Signed)
Discussed cessation efforts and techniques with her today.  Our next goal will be to get down to 13 cigarettes daily.  She does acknowledge significant stressors in her life that have made cutting down difficult.

## 2020-05-28 NOTE — Progress Notes (Signed)
Subjective:    Patient ID: Jodi Beasley, female    DOB: March 20, 1946, 74 y.o.   MRN: [id number]  HPI 74 year old smoker (50 pack years) who has been followed in our office by Dr. Lake Bells for COPD.  She also has a history of hypertension, coronary artery disease/PTCI, depression, GERD, allergic rhinitis, hypothyroidism, chronic renal insufficiency.  She is currently managed on flonase prn, singulair, Breo (did not tolerate Symbicort due to sinusitis). Reports that she can get some SOB with mowing lawn, vacuuming and has to pace herself. She gets some cough, usually dry, often associated with some sneezing, watery eyes. Intermittent nasal gtt, uses the flonase for this. She uses albuterol rarely, maybe once a month. She was treated for an AE in December, had 2 flares in the last year. She continues to smoke 1 pk/day.   Pneumovax up to date. Flu shot up to date.     Pulmonary function testing from 03/15/2016 reviewed, shows severe obstruction, possible superimposed restriction with a positive bronchodilator response.  Her lung volumes confirm restriction, diffusion capacity is decreased and does not correct for alveolar volume  Most recent available chest x-ray reviewed from 04/16/2018 showed no evidence of infiltrate, effusion or hyperinflation.  ROV 05/28/20 --this is a follow-up visit for patient with a history of COPD, allergic rhinitis.  She has severe obstruction by pulmonary function testing and hyperinflation on chest x-ray.  Currently managed on Breo.  She has albuterol which she uses approximately once a week, often at night. She believes that her breathing is doing well. She is sometimes limited with vacuuming or mowing the lawn.  Has to stop to rest.  No burdensome cough or wheezing.  No exacerbations She is currently smoking 15 cig a day, had gotten down to 13 a day.  She has seen Dr Neldon Mc - is allergic to molds. She is benefiting from starting zyrtec, singulair, dymista  She has had the  COVID vaccine.   Review of Systems As per HPI       Objective:   Physical Exam Vitals:   05/28/20 1139  BP: 124/70  Pulse: 60  Temp: 98 F (36.7 C)  TempSrc: Oral  SpO2: 97%  Weight: 158 lb 3.2 oz (71.8 kg)  Height: 5\' 5"  (1.651 m)   Gen: Pleasant, well-nourished, in no distress,  normal affect  ENT: No lesions,  mouth clear,  oropharynx clear, no postnasal drip, slight hoarse voice  Neck: No JVD, no stridor  Lungs: No use of accessory muscles, no crackles or wheezing on normal respiration, no wheeze on forced expiration  Cardiovascular: RRR, heart sounds normal, no murmur or gallops, no peripheral edema  Musculoskeletal: No deformities, no cyanosis or clubbing  Neuro: alert, awake, non focal  Skin: Warm, no lesions or rash      Assessment & Plan:  COPD (chronic obstructive pulmonary disease) with chronic bronchitis (HCC) Overall stable interval.  Some improvement with her smoking.  No exacerbations.  COVID-19 vaccine up-to-date.  We discussed a possible change to Trelegy.  She is doing okay and wants to hold off, continue Breo for now.  We will continue to discuss going forward.  Please continue Breo once daily as you have been taking it.  Rinse and gargle after using. Keep your albuterol available to use 2 puffs when you need it for shortness of breath, chest tightness, wheezing.  You can try taking 2 puffs before exertion to see if this helps with your tasks. COVID-19 vaccine is up-to-date Follow with Dr  Kwali Wrinkle in 6 months or sooner if you have any problems  Allergic rhinitis Continue your Singulair, Zyrtec, Dymista nasal spray as you have been taking it and follow with Dr. Neldon Mc.  Tobacco abuse Discussed cessation efforts and techniques with her today.  Our next goal will be to get down to 13 cigarettes daily.  She does acknowledge significant stressors in her life that have made cutting down difficult.   Baltazar Apo, MD, PhD 05/28/2020, 12:02 PM Jodi Beasley  Pulmonary [facility] [phone] or if no answer [phone]

## 2020-05-28 NOTE — Assessment & Plan Note (Signed)
Continue your Singulair, Zyrtec, Dymista nasal spray as you have been taking it and follow with Dr. Neldon Mc.

## 2020-05-28 NOTE — Assessment & Plan Note (Signed)
Overall stable interval.  Some improvement with her smoking.  No exacerbations.  COVID-19 vaccine up-to-date.  We discussed a possible change to Trelegy.  She is doing okay and wants to hold off, continue Breo for now.  We will continue to discuss going forward.  Please continue Breo once daily as you have been taking it.  Rinse and gargle after using. Keep your albuterol available to use 2 puffs when you need it for shortness of breath, chest tightness, wheezing.  You can try taking 2 puffs before exertion to see if this helps with your tasks. COVID-19 vaccine is up-to-date Follow with Dr Lamonte Sakai in 6 months or sooner if you have any problems

## 2020-05-28 NOTE — Patient Instructions (Addendum)
Please continue Breo once daily as you have been taking it.  Rinse and gargle after using. Keep your albuterol available to use 2 puffs when you need it for shortness of breath, chest tightness, wheezing.  You can try taking 2 puffs before exertion to see if this helps with your tasks. Continue your Singulair, Zyrtec, Dymista nasal spray as you have been taking it and follow with Dr. Neldon Mc. Congratulations on decreasing your cigarettes!  Our next goal will be to cut down to 13 cigarettes daily. COVID-19 vaccine is up-to-date Follow with Dr Lamonte Sakai in 6 months or sooner if you have any problems

## 2020-06-18 ENCOUNTER — Ambulatory Visit: Payer: PRIVATE HEALTH INSURANCE | Admitting: Psychologist

## 2020-06-22 DIAGNOSIS — D1801 Hemangioma of skin and subcutaneous tissue: Secondary | ICD-10-CM | POA: Diagnosis not present

## 2020-06-22 DIAGNOSIS — D225 Melanocytic nevi of trunk: Secondary | ICD-10-CM | POA: Diagnosis not present

## 2020-06-22 DIAGNOSIS — Z1283 Encounter for screening for malignant neoplasm of skin: Secondary | ICD-10-CM | POA: Diagnosis not present

## 2020-06-22 DIAGNOSIS — L812 Freckles: Secondary | ICD-10-CM | POA: Diagnosis not present

## 2020-06-22 DIAGNOSIS — L821 Other seborrheic keratosis: Secondary | ICD-10-CM | POA: Diagnosis not present

## 2020-06-30 ENCOUNTER — Other Ambulatory Visit: Payer: Self-pay | Admitting: Family

## 2020-07-07 ENCOUNTER — Other Ambulatory Visit: Payer: Self-pay | Admitting: Family Medicine

## 2020-07-09 ENCOUNTER — Other Ambulatory Visit: Payer: Self-pay | Admitting: Family Medicine

## 2020-07-13 ENCOUNTER — Other Ambulatory Visit: Payer: Self-pay

## 2020-07-14 DIAGNOSIS — M9901 Segmental and somatic dysfunction of cervical region: Secondary | ICD-10-CM | POA: Diagnosis not present

## 2020-07-14 DIAGNOSIS — M5033 Other cervical disc degeneration, cervicothoracic region: Secondary | ICD-10-CM | POA: Diagnosis not present

## 2020-07-14 DIAGNOSIS — M5416 Radiculopathy, lumbar region: Secondary | ICD-10-CM | POA: Diagnosis not present

## 2020-07-14 DIAGNOSIS — M9903 Segmental and somatic dysfunction of lumbar region: Secondary | ICD-10-CM | POA: Diagnosis not present

## 2020-07-15 ENCOUNTER — Telehealth: Payer: Self-pay | Admitting: Family Medicine

## 2020-07-15 ENCOUNTER — Encounter: Payer: Self-pay | Admitting: Family Medicine

## 2020-07-15 ENCOUNTER — Other Ambulatory Visit: Payer: Self-pay

## 2020-07-15 ENCOUNTER — Ambulatory Visit (INDEPENDENT_AMBULATORY_CARE_PROVIDER_SITE_OTHER): Payer: Medicare Other | Admitting: Family Medicine

## 2020-07-15 VITALS — BP 110/70 | HR 56 | Temp 98.2°F | Ht 65.0 in | Wt 159.0 lb

## 2020-07-15 DIAGNOSIS — M5442 Lumbago with sciatica, left side: Secondary | ICD-10-CM | POA: Diagnosis not present

## 2020-07-15 DIAGNOSIS — E039 Hypothyroidism, unspecified: Secondary | ICD-10-CM

## 2020-07-15 DIAGNOSIS — R001 Bradycardia, unspecified: Secondary | ICD-10-CM | POA: Diagnosis not present

## 2020-07-15 DIAGNOSIS — G8929 Other chronic pain: Secondary | ICD-10-CM | POA: Diagnosis not present

## 2020-07-15 DIAGNOSIS — K219 Gastro-esophageal reflux disease without esophagitis: Secondary | ICD-10-CM | POA: Diagnosis not present

## 2020-07-15 DIAGNOSIS — M5441 Lumbago with sciatica, right side: Secondary | ICD-10-CM

## 2020-07-15 DIAGNOSIS — N1832 Chronic kidney disease, stage 3b: Secondary | ICD-10-CM

## 2020-07-15 LAB — TSH: TSH: 0.6 u[IU]/mL (ref 0.35–4.50)

## 2020-07-15 NOTE — Assessment & Plan Note (Addendum)
Noted on exam and vitals.  EKG as outlined above.  The pause appears similar to her EKG done by cardiology back in November 2020.  It appears they felt as though this was likely a blocked junctional premature beat at that time.  I sent a message to her cardiologist to review the EKGs.  He has noted he will shortly review these and I will let the patient know what he says.  The patient has been asymptomatic.  Discussed if she develops any symptoms such as palpitations, fatigue, chest pain she should seek medical attention immediately.

## 2020-07-15 NOTE — Assessment & Plan Note (Signed)
Chronic issue.  Relatively stable.  Well-controlled with a chiropractor visit.  She will continue Soma.

## 2020-07-15 NOTE — Progress Notes (Signed)
[name], MD Phone: [phone]  Jodi Beasley is a 74 y.o. female who presents today for f/u.  GERD:   Reflux symptoms: no   Abd pain: no   Blood in stool: no  Dysphagia: no   EGD: no history of this  Medication: taking omeprazole for years  HYPOTHYROIDISM Disease Monitoring Weight changes: no  Skin Changes: none recently Heat/Cold intolerance: no  Medication Monitoring Compliance:  Taking synthroid   Last TSH:   Lab Results  Component Value Date   TSH 2.14 11/21/2018   Bradycardia: Noted vitals and exam.  She notes no chest pain.  Chronic breathing issues related to her COPD with the warm weather.  She has history of palpitations in the past though nothing significant recently.  CKD stage III: Patient notes her kidney function is relatively stable.  She follows with nephrology.  Chronic low back pain: Patient notes this is stable.  Jodi Beasley is helpful.  No drowsiness with this.  Follow intake with this.  She sees a Restaurant manager, fast food every 2 to 3 weeks with good benefit.  Social History   Tobacco Use  Smoking Status Current Every Day Smoker  . Packs/day: 2.00  . Years: 53.00  . Pack years: 106.00  . Types: E-cigarettes, Cigarettes  . Start date: 11/19/1960  Smokeless Tobacco Never Used  Tobacco Comment   currently smoking 15cigs per day     ROS see history of present illness  Objective  Physical Exam Vitals:   07/15/20 1107  BP: 110/70  Pulse: (!) 56  Temp: 98.2 F (36.8 C)  SpO2: 96%    BP Readings from Last 3 Encounters:  07/15/20 110/70  05/28/20 124/70  04/28/20 130/80   Wt Readings from Last 3 Encounters:  07/15/20 159 lb (72.1 kg)  05/28/20 158 lb 3.2 oz (71.8 kg)  03/24/20 157 lb 3.2 oz (71.3 kg)    Physical Exam Constitutional:      General: She is not in acute distress.    Appearance: She is not diaphoretic.  Cardiovascular:     Rate and Rhythm: Bradycardia present. Rhythm regularly irregular.     Heart sounds: Normal heart  sounds.  Pulmonary:     Effort: Pulmonary effort is normal.     Breath sounds: Normal breath sounds.  Skin:    General: Skin is warm and dry.  Neurological:     Mental Status: She is alert.    EKG 1: Sinus bradycardia, rate 54, frequent ectopic ventricular beats, artifact noted as well, no apparent ST or T wave changes  EKG 2: Sinus rhythm, rate 61, rate variation noted with 1.6-1.8-second pause, no ST or T wave changes  EKG 3: Sinus bradycardia, rate 56, rate variation noted with 1.6-second pause, no ST or T wave changes  Assessment/Plan: Please see individual problem list.  GERD (gastroesophageal reflux disease) Continue omeprazole.  Monitor for symptoms.  Hypothyroidism Continue Synthroid.  Check TSH.  CKD (chronic kidney disease), stage III (HCC) Seems to be relatively stable.  She will continue to see nephrology.  Bradycardia Noted on exam and vitals.  EKG as outlined above.  The pause appears similar to her EKG done by cardiology back in November 2020.  It appears they felt as though this was likely a blocked junctional premature beat at that time.  I sent a message to her cardiologist to review the EKGs.  He has noted he will shortly review these and I will let the patient know what he says.  The patient has been asymptomatic.  Discussed if she develops any symptoms such as palpitations, fatigue, chest pain she should seek medical attention immediately.  Chronic low back pain (Primary Area of Pain) (Bilateral) (L>R) Chronic issue.  Relatively stable.  Well-controlled with a chiropractor visit.  She will continue Soma.   Health Maintenance: patient declines colonoscopy at this time.  Discussed risk of missing a significant lesion with delaying colonoscopy.  She understands this risk.  Orders Placed This Encounter  Procedures  . TSH  . EKG 12-Lead    No orders of the defined types were placed in this encounter.   This visit occurred during the SARS-CoV-2 public  health emergency.  Safety protocols were in place, including screening questions prior to the visit, additional usage of staff PPE, and extensive cleaning of exam room while observing appropriate contact time as indicated for disinfecting solutions.   I have spent 42 minutes in the care of this patient regarding history taking, documentation, EKG interpretation, communication with cardiology regarding EKG findings, and documentation.   [name], MD [place]

## 2020-07-15 NOTE — Assessment & Plan Note (Signed)
Continue omeprazole.  Monitor for symptoms.

## 2020-07-15 NOTE — Assessment & Plan Note (Signed)
Continue Synthroid.  Check TSH. 

## 2020-07-15 NOTE — Assessment & Plan Note (Signed)
Seems to be relatively stable.  She will continue to see nephrology.

## 2020-07-15 NOTE — Telephone Encounter (Signed)
I called and informed the patient that her cardiologist looked at the EKG that was done here and no treatment is needed at this time, but if she has any new symptoms, fatigue, chest pains of palpitations to let us know right away, she understood.  Jodi Beasley,cma

## 2020-07-15 NOTE — Telephone Encounter (Signed)
Please let the patient know that her cardiologist looked at her EKGs and noted they looked about the same. As long as she remains asymptomatic he noted no treatment was needed. If she develops palpitations, fatigue, chest pain, or other new symptoms she should let us know right away.

## 2020-07-15 NOTE — Patient Instructions (Signed)
Nice to see you. I am going to have your cardiologist review your EKG to get their input.  We will contact you with their answer. We will check some lab work today for your thyroid.

## 2020-07-16 ENCOUNTER — Other Ambulatory Visit: Payer: Self-pay

## 2020-07-16 ENCOUNTER — Other Ambulatory Visit: Payer: Self-pay | Admitting: Family Medicine

## 2020-07-16 MED ORDER — MUPIROCIN 2 % EX OINT: 1.0000 "application " | TOPICAL_OINTMENT | Freq: Two times a day (BID) | CUTANEOUS | 0 refills | Status: DC | PRN

## 2020-07-16 MED ORDER — CARISOPRODOL 350 MG PO TABS: 350.0000 mg | ORAL_TABLET | Freq: Every day | ORAL | 0 refills | Status: DC

## 2020-07-16 NOTE — Progress Notes (Signed)
Cardiology Clinic Note   Patient Name: Jodi Beasley Date of Encounter: 07/17/2020  Primary Care Provider:  Leone Haven, MD Primary Cardiologist:  Sanda Klein, MD  Patient Profile    Jodi Beasley 74 year old female presents the clinic today for follow-up evaluation of her EKG.  Past Medical History    Past Medical History:  Diagnosis Date   Arrhythmia    Chicken pox    COPD (chronic obstructive pulmonary disease) ([place])    Coronary artery disease    NSTEMI in 09/2008. Cath : 80% RCA and 95% first diagonal. PCI and 2 DES placement  RCA and 1 DES to diagonal. Complicated by acute diagonal stent thrombosis . Treated by thrombectomy. Most recent nuclear stress test in 2010 showed no ischemia with normal EF.    Depression    GERD (gastroesophageal reflux disease)    Hay fever    History of blood transfusion    History of hypercholesterolemia    History of syncope 2000   negative EP study   Hyperlipidemia    intolerance to statins except Crestor.    Hypertension    Hypothyroid    MI (myocardial infarction) ([place])    Pneumonia, organism unspecified(486)    Sepsis ([place]) 04/27/2018   Past Surgical History:  Procedure Laterality Date   CORONARY ANGIOPLASTY WITH STENT PLACEMENT  2009   [facility], drug-eluting mid RCA,prox diag;   TONSILLECTOMY     TUBAL LIGATION      Allergies  Allergies  Allergen Reactions   Benzocaine     Tongue swelling   Darvon [Propoxyphene Hcl]     HA   Monosodium Glutamate Diarrhea   Neosporin [Neomycin-Bacitracin Zn-Polymyx]     blisters   Nicoderm [Nicotine]     arrythmia   Prednisone     Cannot tolerate high doses per patient- caused depressive thoughts    History of Present Illness    Ms. Torgeson has a PMH of coronary artery disease (NSTEMI 10/09 with PCI and DES x2 to her RCA and diagonal.  Immediately following she had a lateral STEMI due to acute diagonal stent thrombosis which was treated  with thrombectomy and angioplasty.  Underwent nuclear stress test 2014 which was normal with normal LVEF), hypertension, COPD, allergic rhinitis, GERD, chronic low back pain, hypothyroidism, CKD stage III, hyponatremia, bradycardia, and palpitations.  She was last seen by Dr. Eulas Post on 10/22/2019.  During that time she denied chest pain, dyspnea, orthopnea, PND, syncope, palpitations, lower extremity edema, and hemoptysis.  She continues to smoke a pack per day and has no intention to quit smoking.  She had follow-up with nephrology who told her her renal function was about 33% of normal.  She developed itching after prescribed a medication to help her kidneys (felt to be an ARB), she did not remember the name of the medication.  Her creatinine 6/20 was 1.5 and was 1.63 12/19.  She was planning to relocate to [place] to live with her sister however, she wished to maintain her care/positions in [place].  She presents the clinic today for follow-up evaluation and states she has been to her PCP who noticed that her EKG was abnormal.  Her EKG was reviewed by cardiology and no changes from prior EKGs were noted.  She states she feels well.  She remains physically active doing yard work with her push mower, cleaning her house.  She states she became concerned when her PCP told her that her EKG was abnormal.  We reviewed her previous EKGs,  discuss her heart rate/rhythm, and she expressed understanding.  She is switched to a more organic diet.  She also indicated that she is taking a vacation to the [place] and wanted to make sure she was fine before she left.  She is also planning on moving and has been busy packing.  Her breathing remained stable at this time.  She did indicate that she had 2 dizzy spells about a month ago that happened in the morning after feeding her dogs.  Symptoms have resolved but she has had no further events.  I have asked her to continue to monitor her blood pressure, maintain her physical  activity, and heart healthy diet.  We will have her follow-up with Dr. Loletha Grayer as scheduled in 3 months.  Today she denies chest pain, shortness of breath, lower extremity edema, fatigue, palpitations, melena, hematuria, hemoptysis, diaphoresis, weakness, presyncope, syncope, orthopnea, and PND.   Home Medications    Prior to Admission medications   Medication Sig Start Date End Date Taking? Authorizing Provider  albuterol (VENTOLIN HFA) 108 (90 Base) MCG/ACT inhaler INHALE 2 PUFFS INTO THE LUNGS EVERY 6 HOURS AS NEEDED FOR WHEEZING OR SHORTNESS OF BREATH 03/24/20   Kozlow, Donnamarie Poag, MD  aspirin 81 MG tablet Take 81 mg by mouth daily.    [provider]  atorvastatin (LIPITOR) 80 MG tablet TAKE 1 TABLET(80 MG) BY MOUTH DAILY 07/09/20   Leone Haven, MD  Azelastine-Fluticasone [phone] MCG/ACT SUSP 1 spray each nostril twice a day 03/24/20   Jiles Prows, MD  buPROPion (WELLBUTRIN XL) 150 MG 24 hr tablet TAKE 1 TABLET(150 MG) BY MOUTH DAILY 06/30/20   Burnard Hawthorne, FNP  carisoprodol (SOMA) 350 MG tablet Take 1 tablet (350 mg total) by mouth at bedtime. 07/16/20   Leone Haven, MD  cetirizine (ZYRTEC) 10 MG tablet Take 1-2 tablets 1-2 times daily as needed. 04/17/20   Kozlow, Donnamarie Poag, MD  Cholecalciferol (VITAMIN D-3) 1000 UNITS CAPS Take 1 capsule by mouth daily.    [provider]  Coenzyme Q10 (COQ10) 400 MG CAPS Take 400 mg by mouth daily.    [provider]  escitalopram (LEXAPRO) 10 MG tablet TAKE 1 TABLET BY MOUTH EVERY DAY 07/07/20   Leone Haven, MD  ezetimibe (ZETIA) 10 MG tablet TAKE 1 TABLET(10 MG) BY MOUTH DAILY 11/25/19   Leone Haven, MD  fluticasone furoate-vilanterol (BREO ELLIPTA) 100-25 MCG/INH AEPB Inhale 1 puff into the lungs daily. 03/24/20   Kozlow, Donnamarie Poag, MD  levocetirizine (XYZAL) 5 MG tablet TAKE 1 TABLET(5 MG) BY MOUTH EVERY EVENING 03/10/20   McLean-Scocuzza, Nino Glow, MD  levothyroxine (SYNTHROID) 100 MCG tablet TAKE 1 TABLET(100  MCG) BY MOUTH DAILY BEFORE BREAKFAST 02/10/20   Leone Haven, MD  metoprolol tartrate (LOPRESSOR) 25 MG tablet Take 1 tablet (25 mg total) by mouth 2 (two) times daily. 10/22/19   Croitoru, Mihai, MD  Multiple Vitamin (MULTIVITAMIN) capsule Take 1 capsule by mouth daily.    [provider]  mupirocin ointment (BACTROBAN) 2 % Apply 1 application topically 2 (two) times daily as needed. 07/16/20   Leone Haven, MD  nitroGLYCERIN (NITROSTAT) 0.4 MG SL tablet Place 1 tablet (0.4 mg total) under the tongue every 5 (five) minutes as needed. 10/22/19   Croitoru, Mihai, MD  omeprazole (PRILOSEC) 20 MG capsule TAKE 1 CAPSULE BY MOUTH EVERY DAY 05/15/20   Leone Haven, MD    Family History    Family History  Problem Relation Age of Onset   Colon cancer Father    Lung cancer Father        was a former smoker   Cancer Father        lung   Diabetes Father    Hypertension Mother    Hypertension Other    Diabetes Other    Allergic rhinitis Neg Hx    Angioedema Neg Hx    Asthma Neg Hx    Atopy Neg Hx    Eczema Neg Hx    Immunodeficiency Neg Hx    Urticaria Neg Hx    She indicated that her mother is deceased. She indicated that her father is deceased. She indicated that her sister is alive. She indicated that the status of her neg hx is unknown. She indicated that the status of her other is unknown.  Social History    Social History   Socioeconomic History   Marital status: Widowed    Spouse name: Not on file   Number of children: Not on file   Years of education: Not on file   Highest education level: Not on file  Occupational History   Not on file  Tobacco Use   Smoking status: Current Every Day Smoker    Packs/day: 2.00    Years: 53.00    Pack years: 106.00    Types: E-cigarettes, Cigarettes    Start date: 11/19/1960   Smokeless tobacco: Never Used   Tobacco comment: currently smoking 15cigs per day  Vaping Use   Vaping Use: Former    Substance and Sexual Activity   Alcohol use: Yes    Alcohol/week: 1.0 standard drink    Types: 1 Standard drinks or equivalent per week   Drug use: No   Sexual activity: Yes  Other Topics Concern   Not on file  Social History Narrative   Lives with mother in [place]. 2 cats 2 dogs in home.   Recently moved from [facility].   Custody of 33month old.   Social Determinants of Health   Financial Resource Strain:    Difficulty of Paying Living Expenses:   Food Insecurity:    Worried About Charity fundraiser in the Last Year:    Arboriculturist in the Last Year:   Transportation Needs:    Film/video editor (Medical):    Lack of Transportation (Non-Medical):   Physical Activity:    Days of Exercise per Week:    Minutes of Exercise per Session:   Stress:    Feeling of Stress :   Social Connections:    Frequency of Communication with Friends and Family:    Frequency of Social Gatherings with Friends and Family:    Attends Religious Services:    Active Member of Clubs or Organizations:    Attends Music therapist:    Marital Status:   Intimate Partner Violence:    Fear of Current or Ex-Partner:    Emotionally Abused:    Physically Abused:    Sexually Abused:      Review of Systems    General:  No chills, fever, night sweats or weight changes.  Cardiovascular:  No chest pain, dyspnea on exertion, edema, orthopnea, palpitations, paroxysmal nocturnal dyspnea. Dermatological: No rash, lesions/masses Respiratory: No cough, dyspnea Urologic: No hematuria, dysuria Abdominal:   No nausea, vomiting, diarrhea, bright red blood per rectum, melena, or hematemesis Neurologic:  No visual changes, wkns, changes in mental status. All other systems reviewed and are otherwise  negative except as noted above.  Physical Exam    VS:  BP 128/84    Pulse (!) 54    Ht 5\' 5"  (1.651 m)    Wt 159 lb (72.1 kg)    SpO2 98%    BMI 26.46 kg/m  , BMI Body mass  index is 26.46 kg/m. GEN: Well nourished, well developed, in no acute distress. HEENT: normal. Neck: Supple, no JVD, carotid bruits, or masses. Cardiac: RRR, no murmurs, rubs, or gallops. No clubbing, cyanosis, edema.  Radials/DP/PT 2+ and equal bilaterally.  Respiratory:  Respirations regular and unlabored, clear to auscultation bilaterally. GI: Soft, nontender, nondistended, BS + x 4. MS: no deformity or atrophy. Skin: warm and dry, no rash. Neuro:  Strength and sensation are intact. Psych: Normal affect.  Accessory Clinical Findings    Recent Labs: 10/28/2019: ALT 16; BUN 20; Creatinine, Ser 1.31; Potassium 3.9; Sodium 139 07/15/2020: TSH 0.60   Recent Lipid Panel    Component Value Date/Time   CHOL 151 10/28/2019 1046   TRIG 185.0 (H) 10/28/2019 1046   HDL 48.30 10/28/2019 1046   CHOLHDL 3 10/28/2019 1046   VLDL 37.0 10/28/2019 1046   LDLCALC 65 10/28/2019 1046   LDLDIRECT 72.0 12/27/2018 1049    ECG personally reviewed by me today-none today.  EKG 10/22/2019 Sinus bradycardia with marked sinus arrhythmia 52 bpm  EKG 07/15/2020 Sinus bradycardia with frequent ventricular ectopic beats 54 bpm  Nuclear stress test 08/22/2013 Normal nuclear stress test low risk no ischemia  Assessment & Plan   1.  Coronary artery disease-no chest pain today.  Normal nuclear stress test 9/14.  Continues to be somewhat physically active. Continue aspirin, atorvastatin co-Q10, Zetia, metoprolol, nitroglycerin Heart healthy low-sodium diet-salty 6 given Increase physical activity as tolerated  Essential hypertension-BP today 128/84.  Well-controlled at home Continue metoprolol Heart healthy low-sodium diet-salty 6 given Increase physical activity as tolerated  Palpitations-no recent episodes of increased heart rate/skipped beats.  Recent EKG shows no changes in her cardiac rate/rhythm. Continue metoprolol Avoid triggers caffeine, chocolate, EtOH, stimulants etc. Increase physical  activity as tolerated   Mixed hyperlipidemia-10/28/2019: Cholesterol 151; HDL 48.30; LDL Cholesterol 65; Triglycerides 185.0; VLDL 37.0 Continue atorvastatin, go to 10, Zetia Heart healthy low-sodium high-fiber diet Increase physical activity as tolerated   Disposition: Follow-up with Dr. Sallyanne Kuster in 3 months.  Jossie Ng. Kendyl Festa NP-C    07/17/2020, 11:04 AM [address] Office [phone] Fax [phone]  Notice: This dictation was prepared with Dragon dictation along with smaller phrase technology. Any transcriptional errors that result from this process are unintentional and may not be corrected upon review.

## 2020-07-16 NOTE — Telephone Encounter (Signed)
Pt needs refills on the following sent to [facility]  carisoprodol (SOMA) 350 MG tablet mupirocin ointment (BACTROBAN) 2 %

## 2020-07-17 ENCOUNTER — Other Ambulatory Visit: Payer: Self-pay

## 2020-07-17 ENCOUNTER — Ambulatory Visit (INDEPENDENT_AMBULATORY_CARE_PROVIDER_SITE_OTHER): Payer: Medicare Other | Admitting: General Practice

## 2020-07-17 ENCOUNTER — Encounter: Payer: Self-pay | Admitting: General Practice

## 2020-07-17 VITALS — BP 128/84 | HR 54 | Ht 65.0 in | Wt 159.0 lb

## 2020-07-17 DIAGNOSIS — I10 Essential (primary) hypertension: Secondary | ICD-10-CM | POA: Diagnosis not present

## 2020-07-17 DIAGNOSIS — R002 Palpitations: Secondary | ICD-10-CM

## 2020-07-17 DIAGNOSIS — I2510 Atherosclerotic heart disease of native coronary artery without angina pectoris: Secondary | ICD-10-CM | POA: Diagnosis not present

## 2020-07-17 DIAGNOSIS — E782 Mixed hyperlipidemia: Secondary | ICD-10-CM

## 2020-07-17 NOTE — Patient Instructions (Addendum)
Medication Instructions:  Your physician recommends that you continue on your current medications as directed. Please refer to the Current Medication list given to you today.  *If you need a refill on your cardiac medications before your next appointment, please call your pharmacy*    Follow-Up: At [facility], you and your health needs are our priority.  As part of our continuing mission to provide you with exceptional heart care, we have created designated Provider Care Teams.  These Care Teams include your primary Cardiologist (physician) and Advanced Practice Providers (APPs -  Physician Assistants and Nurse Practitioners) who all work together to provide you with the care you need, when you need it.  We recommend signing up for the patient portal called "MyChart".  Sign up information is provided on this After Visit Summary.  MyChart is used to connect with patients for Virtual Visits (Telemedicine).  Patients are able to view lab/test results, encounter notes, upcoming appointments, etc.  Non-urgent messages can be sent to your provider as well.   To learn more about what you can do with MyChart, go to [email or web address].    Your next appointment:   November 2021  The format for your next appointment:   In Person  Provider:   Sanda Klein, MD   Other Instructions

## 2020-07-17 NOTE — Progress Notes (Signed)
Thanks

## 2020-08-10 ENCOUNTER — Other Ambulatory Visit: Payer: Self-pay | Admitting: Family Medicine

## 2020-08-11 ENCOUNTER — Other Ambulatory Visit: Payer: Self-pay | Admitting: Family Medicine

## 2020-08-12 DIAGNOSIS — M9901 Segmental and somatic dysfunction of cervical region: Secondary | ICD-10-CM | POA: Diagnosis not present

## 2020-08-12 DIAGNOSIS — M9903 Segmental and somatic dysfunction of lumbar region: Secondary | ICD-10-CM | POA: Diagnosis not present

## 2020-08-12 DIAGNOSIS — M5033 Other cervical disc degeneration, cervicothoracic region: Secondary | ICD-10-CM | POA: Diagnosis not present

## 2020-08-12 DIAGNOSIS — M5416 Radiculopathy, lumbar region: Secondary | ICD-10-CM | POA: Diagnosis not present

## 2020-08-13 ENCOUNTER — Ambulatory Visit
Admission: RE | Admit: 2020-08-13 | Discharge: 2020-08-13 | Disposition: A | Discharge status: Home / Self Care | Payer: Medicare Other | Source: Ambulatory Visit | Attending: Family Medicine | Admitting: Family Medicine

## 2020-08-13 DIAGNOSIS — Z1231 Encounter for screening mammogram for malignant neoplasm of breast: Secondary | ICD-10-CM | POA: Diagnosis not present

## 2020-08-13 DIAGNOSIS — Z23 Encounter for immunization: Secondary | ICD-10-CM | POA: Diagnosis not present

## 2020-08-14 ENCOUNTER — Other Ambulatory Visit: Payer: Self-pay | Admitting: Family Medicine

## 2020-08-14 DIAGNOSIS — N6489 Other specified disorders of breast: Secondary | ICD-10-CM

## 2020-08-14 DIAGNOSIS — R928 Other abnormal and inconclusive findings on diagnostic imaging of breast: Secondary | ICD-10-CM

## 2020-08-20 ENCOUNTER — Encounter: Payer: Self-pay | Admitting: *Deleted

## 2020-08-20 ENCOUNTER — Telehealth: Payer: Self-pay | Admitting: *Deleted

## 2020-08-20 NOTE — Telephone Encounter (Signed)
Pt returned call & is aware of results & appt

## 2020-08-20 NOTE — Telephone Encounter (Signed)
Left voicemail to return call to discuss mammogram results or to check mychart.

## 2020-08-23 ENCOUNTER — Other Ambulatory Visit: Payer: Self-pay | Admitting: Family Medicine

## 2020-08-24 ENCOUNTER — Other Ambulatory Visit: Payer: Self-pay

## 2020-08-24 ENCOUNTER — Ambulatory Visit
Admission: RE | Admit: 2020-08-24 | Discharge: 2020-08-24 | Disposition: A | Discharge status: Home / Self Care | Payer: Medicare Other | Source: Ambulatory Visit | Attending: Family Medicine | Admitting: Family Medicine

## 2020-08-24 DIAGNOSIS — N6489 Other specified disorders of breast: Secondary | ICD-10-CM | POA: Diagnosis not present

## 2020-08-24 DIAGNOSIS — R928 Other abnormal and inconclusive findings on diagnostic imaging of breast: Secondary | ICD-10-CM | POA: Diagnosis not present

## 2020-08-25 ENCOUNTER — Telehealth: Payer: Self-pay | Admitting: Family Medicine

## 2020-08-25 NOTE — Telephone Encounter (Signed)
Patient called in for refill on carisoprodol (SOMA) 350 MG tablet

## 2020-08-26 NOTE — Telephone Encounter (Signed)
Based on when it was last filled she is not due for refill yet.  As this is a controlled substance she should contact us closer to the time of the need for refill.

## 2020-08-27 ENCOUNTER — Telehealth: Payer: Self-pay | Admitting: Family Medicine

## 2020-08-27 NOTE — Telephone Encounter (Signed)
Patient is having a problem fill her carisoprodol (SOMA) 350 MG tablet. She is suppose to take two a day and the instructions on the bottle is written 1 a day. Pharmacy will not reifill and states she needs a new prescription written to say 2 pills a day. She states she had taken 2 pills a day for years.

## 2020-08-27 NOTE — Telephone Encounter (Signed)
Please advise? Not sure what dose this patient is supposed to be one besides what is in chart?

## 2020-08-28 ENCOUNTER — Other Ambulatory Visit: Payer: Self-pay | Admitting: Family Medicine

## 2020-08-28 MED ORDER — CARISOPRODOL 350 MG PO TABS
350.0000 mg | ORAL_TABLET | Freq: Two times a day (BID) | ORAL | 0 refills | Status: DC
Start: 2020-08-28 — End: 2020-10-05

## 2020-08-28 NOTE — Telephone Encounter (Signed)
It looks like it was reordered incorrectly previously with the directions changed by one of the CMA's.  I have corrected this and sent it to the patient's pharmacy.

## 2020-09-07 ENCOUNTER — Other Ambulatory Visit: Payer: Self-pay | Admitting: Allergy and Immunology

## 2020-09-10 DIAGNOSIS — Z23 Encounter for immunization: Secondary | ICD-10-CM | POA: Diagnosis not present

## 2020-09-15 ENCOUNTER — Ambulatory Visit
Admission: EM | Admit: 2020-09-15 | Discharge: 2020-09-15 | Disposition: A | Discharge status: Home / Self Care | Payer: Medicare Other

## 2020-09-15 ENCOUNTER — Ambulatory Visit: Payer: Self-pay

## 2020-09-15 DIAGNOSIS — J0110 Acute frontal sinusitis, unspecified: Secondary | ICD-10-CM | POA: Diagnosis not present

## 2020-09-15 DIAGNOSIS — M5033 Other cervical disc degeneration, cervicothoracic region: Secondary | ICD-10-CM | POA: Diagnosis not present

## 2020-09-15 DIAGNOSIS — J029 Acute pharyngitis, unspecified: Secondary | ICD-10-CM | POA: Diagnosis not present

## 2020-09-15 DIAGNOSIS — R059 Cough, unspecified: Secondary | ICD-10-CM

## 2020-09-15 DIAGNOSIS — M5416 Radiculopathy, lumbar region: Secondary | ICD-10-CM | POA: Diagnosis not present

## 2020-09-15 DIAGNOSIS — R0981 Nasal congestion: Secondary | ICD-10-CM

## 2020-09-15 DIAGNOSIS — M9903 Segmental and somatic dysfunction of lumbar region: Secondary | ICD-10-CM | POA: Diagnosis not present

## 2020-09-15 DIAGNOSIS — M9901 Segmental and somatic dysfunction of cervical region: Secondary | ICD-10-CM | POA: Diagnosis not present

## 2020-09-15 MED ORDER — PREDNISONE 10 MG PO TABS: 20.0000 mg | ORAL_TABLET | Freq: Every day | ORAL | 0 refills | Status: AC

## 2020-09-15 MED ORDER — AMOXICILLIN-POT CLAVULANATE 875-125 MG PO TABS: 1.0000 | ORAL_TABLET | Freq: Two times a day (BID) | ORAL | 0 refills | Status: AC

## 2020-09-15 NOTE — ED Triage Notes (Signed)
Pt reports sinus pressure, scratchy throat, dry cough for approx 10 days.  Has sinus issues this time every year.  No relief from Zyrtec or nasal spray.  Does not want tested for COVID- had booster last week.

## 2020-09-15 NOTE — ED Provider Notes (Signed)
[place]   [id number] 09/15/20 Arrival Time: 3382  [id number] THROAT  SUBJECTIVE: History from: patient.  Jodi Beasley is a 74 y.o. female who presents with abrupt onset of nasal congestion, headache, fatigue, scratchy throat for 10 days. Denies sick exposure to Covid, strep, flu or mono, or precipitating event. Has negative history of Covid. Has completed Covid vaccines, has also completed her booster. Has tried zyrtec and nasal spray without relief. There are no aggravating symptoms. Reports that she gets a sinus infection every year. Declines Covid testing today  Denies fever, chills, ear pain, rhinorrhea, cough, SOB, wheezing, chest pain, nausea, rash, changes in bowel or bladder habits.    ROS: As per HPI.  All other pertinent ROS negative.     Past Medical History:  Diagnosis Date  . Arrhythmia   . Chicken pox   . COPD (chronic obstructive pulmonary disease) ([place])   . Coronary artery disease    NSTEMI in 09/2008. Cath : 80% RCA and 95% first diagonal. PCI and 2 DES placement  RCA and 1 DES to diagonal. Complicated by acute diagonal stent thrombosis . Treated by thrombectomy. Most recent nuclear stress test in 2010 showed no ischemia with normal EF.   Marland Kitchen Depression   . GERD (gastroesophageal reflux disease)   . Hay fever   . History of blood transfusion   . History of hypercholesterolemia   . History of syncope 2000   negative EP study  . Hyperlipidemia    intolerance to statins except Crestor.   . Hypertension   . Hypothyroid   . MI (myocardial infarction) ([place])   . Pneumonia, organism unspecified(486)   . Sepsis ([place]) 04/27/2018   Past Surgical History:  Procedure Laterality Date  . CORONARY ANGIOPLASTY WITH STENT PLACEMENT  2009   [facility], drug-eluting mid RCA,prox diag;  . TONSILLECTOMY    . TUBAL LIGATION     Allergies  Allergen Reactions  . Benzocaine     Tongue swelling  . Darvon [Propoxyphene Hcl]     HA  . Monosodium Glutamate  Diarrhea  . Neosporin [Neomycin-Bacitracin Zn-Polymyx]     blisters  . Nicoderm [Nicotine]     arrythmia  . Prednisone     Cannot tolerate high doses per patient- caused depressive thoughts   No current facility-administered medications on file prior to encounter.   Current Outpatient Medications on File Prior to Encounter  Medication Sig Dispense Refill  . aspirin 81 MG tablet Take 81 mg by mouth daily.    Marland Kitchen atorvastatin (LIPITOR) 80 MG tablet TAKE 1 TABLET(80 MG) BY MOUTH DAILY 90 tablet 3  . Azelastine-Fluticasone 137-50 MCG/ACT SUSP 1 spray each nostril twice a day 23 g 5  . buPROPion (WELLBUTRIN XL) 150 MG 24 hr tablet TAKE 1 TABLET(150 MG) BY MOUTH DAILY 90 tablet 0  . carisoprodol (SOMA) 350 MG tablet Take 1 tablet (350 mg total) by mouth 2 (two) times daily. 60 tablet 0  . cetirizine (ZYRTEC) 10 MG tablet Take 1-2 tablets 1-2 times daily as needed. 120 tablet 5  . Cholecalciferol (VITAMIN D-3) 1000 UNITS CAPS Take 1 capsule by mouth daily.    . Coenzyme Q10 (COQ10) 400 MG CAPS Take 400 mg by mouth daily.    Marland Kitchen ezetimibe (ZETIA) 10 MG tablet TAKE 1 TABLET(10 MG) BY MOUTH DAILY 90 tablet 3  . fluticasone furoate-vilanterol (BREO ELLIPTA) 100-25 MCG/INH AEPB Inhale 1 puff into the lungs daily. 60 each 5  . Garlic 6734 MG CAPS Take by mouth.    Marland Kitchen  levothyroxine (SYNTHROID) 100 MCG tablet TAKE 1 TABLET(100 MCG) BY MOUTH DAILY BEFORE BREAKFAST 90 tablet 1  . metoprolol tartrate (LOPRESSOR) 25 MG tablet Take 1 tablet (25 mg total) by mouth 2 (two) times daily. 60 tablet 11  . montelukast (SINGULAIR) 10 MG tablet TAKE 1 TABLET(10 MG) BY MOUTH AT BEDTIME 90 tablet 0  . Multiple Vitamin (MULTIVITAMIN) capsule Take 1 capsule by mouth daily.    . nitroGLYCERIN (NITROSTAT) 0.4 MG SL tablet Place 1 tablet (0.4 mg total) under the tongue every 5 (five) minutes as needed. 25 tablet 3  . omeprazole (PRILOSEC) 20 MG capsule TAKE 1 CAPSULE BY MOUTH EVERY DAY 90 capsule 0  . psyllium (METAMUCIL) 58.6  % packet Take 1 packet by mouth daily.    . vitamin C (ASCORBIC ACID) 250 MG tablet Take 250 mg by mouth daily.    Marland Kitchen albuterol (VENTOLIN HFA) 108 (90 Base) MCG/ACT inhaler INHALE 2 PUFFS INTO THE LUNGS EVERY 6 HOURS AS NEEDED FOR WHEEZING OR SHORTNESS OF BREATH 51 g 0  . escitalopram (LEXAPRO) 10 MG tablet TAKE 1 TABLET BY MOUTH EVERY DAY 30 tablet 2  . levocetirizine (XYZAL) 5 MG tablet TAKE 1 TABLET(5 MG) BY MOUTH EVERY EVENING 90 tablet 1  . mupirocin ointment (BACTROBAN) 2 % Apply 1 application topically 2 (two) times daily as needed. 22 g 0   Social History   Socioeconomic History  . Marital status: Widowed    Spouse name: Not on file  . Number of children: Not on file  . Years of education: Not on file  . Highest education level: Not on file  Occupational History  . Not on file  Tobacco Use  . Smoking status: Current Every Day Smoker    Packs/day: 1.00    Years: 53.00    Pack years: 53.00    Types: E-cigarettes, Cigarettes    Start date: 11/19/1960  . Smokeless tobacco: Never Used  . Tobacco comment: currently smoking 15cigs per day  Vaping Use  . Vaping Use: Former  Substance and Sexual Activity  . Alcohol use: Never    Alcohol/week: 1.0 standard drink    Types: 1 Standard drinks or equivalent per week  . Drug use: Never  . Sexual activity: Yes  Other Topics Concern  . Not on file  Social History Narrative   Lives with mother in [place]. 2 cats 2 dogs in home.   Recently moved from [facility]   Custody of 29month old.   Social Determinants of Health   Financial Resource Strain:   . Difficulty of Paying Living Expenses: Not on file  Food Insecurity:   . Worried About Charity fundraiser in the Last Year: Not on file  . Ran Out of Food in the Last Year: Not on file  Transportation Needs:   . Lack of Transportation (Medical): Not on file  . Lack of Transportation (Non-Medical): Not on file  Physical Activity:   . Days of Exercise per Week: Not on file  .  Minutes of Exercise per Session: Not on file  Stress:   . Feeling of Stress : Not on file  Social Connections:   . Frequency of Communication with Friends and Family: Not on file  . Frequency of Social Gatherings with Friends and Family: Not on file  . Attends Religious Services: Not on file  . Active Member of Clubs or Organizations: Not on file  . Attends Archivist Meetings: Not on file  . Marital Status:  Not on file  Intimate Partner Violence:   . Fear of Current or Ex-Partner: Not on file  . Emotionally Abused: Not on file  . Physically Abused: Not on file  . Sexually Abused: Not on file   Family History  Problem Relation Age of Onset  . Colon cancer Father   . Lung cancer Father        was a former smoker  . Cancer Father        lung  . Diabetes Father   . Hypertension Mother   . Hypertension Other   . Diabetes Other   . Allergic rhinitis Neg Hx   . Angioedema Neg Hx   . Asthma Neg Hx   . Atopy Neg Hx   . Eczema Neg Hx   . Immunodeficiency Neg Hx   . Urticaria Neg Hx     OBJECTIVE:  Vitals:   09/15/20 1419  BP: (!) 159/81  Pulse: 67  Resp: 18  Temp: 98.5 F (36.9 C)  TempSrc: Oral  SpO2: 94%    General appearance: alert; appears fatigued, but nontoxic, speaking in full sentences and managing own secretions HEENT: NCAT; Ears: EACs clear, TMs pearly gray with visible cone of light, without erythema; Eyes: PERRL, EOMI grossly; Nose: no obvious rhinorrhea; Throat: oropharynx clear, tonsils 1+ and mildly erythematous without white tonsillar exudates, uvula midline; Sinuses: tender to palpation Neck: supple without LAD Lungs: CTA bilaterally without adventitious breath sounds; cough absent Heart: regular rate and rhythm.  Radial pulses 2+ symmetrical bilaterally Skin: warm and dry Psychological: alert and cooperative; normal mood and affect  LABS: No results found for this or any previous visit (from the past 24 hour(s)).   ASSESSMENT & PLAN:  1.  Acute non-recurrent frontal sinusitis   2. Nasal congestion   3. Cough   4. Sore throat     Meds ordered this encounter  Medications  . amoxicillin-clavulanate (AUGMENTIN) 875-125 MG tablet    Sig: Take 1 tablet by mouth 2 (two) times daily for 7 days.    Dispense:  14 tablet    Refill:  0    Order Specific Question:   Supervising Provider    Answer:   Chase Picket [id number]  . predniSONE (DELTASONE) 10 MG tablet    Sig: Take 2 tablets (20 mg total) by mouth daily for 5 days.    Dispense:  10 tablet    Refill:  0    Order Specific Question:   Supervising Provider    Answer:   Chase Picket [id number]    Acute Sinusitis Push fluids and get rest Prescribed Augmentin Prescribed steroid Take as directed and to completion.  Drink warm or cool liquids, use throat lozenges, or popsicles to help alleviate symptoms Take OTC ibuprofen or tylenol as needed for pain May use Zyrtec D and flonase to help alleviate symptoms Follow up with PCP if symptoms persist Return or go to ER if you have any new or worsening symptoms such as fever, chills, nausea, vomiting, worsening sore throat, cough, abdominal pain, chest pain, changes in bowel or bladder habits.   Reviewed expectations re: course of current medical issues. Questions answered. Outlined signs and symptoms indicating need for more acute intervention. Patient verbalized understanding. After Visit Summary given.          Faustino Congress, NP 09/15/20 1448

## 2020-09-15 NOTE — Discharge Instructions (Addendum)
I think that you have a sinus infection.  I have sent in an antibiotic for you to take twice a day for 7 days  I have sent in a steroid for you to take one tablet for 5 days  Follow up with this office or with primary care as needed  Follow up with the ER for high fever, trouble swallowing, trouble breathing, other concerning symptoms

## 2020-09-28 ENCOUNTER — Other Ambulatory Visit: Payer: Self-pay | Admitting: Family

## 2020-10-03 ENCOUNTER — Other Ambulatory Visit: Payer: Self-pay | Admitting: Family Medicine

## 2020-10-06 ENCOUNTER — Other Ambulatory Visit: Payer: Self-pay | Admitting: Allergy and Immunology

## 2020-10-06 ENCOUNTER — Other Ambulatory Visit: Payer: Self-pay | Admitting: Family Medicine

## 2020-10-15 ENCOUNTER — Ambulatory Visit: Payer: Medicare Other | Admitting: Cardiovascular Disease

## 2020-10-16 DIAGNOSIS — H04123 Dry eye syndrome of bilateral lacrimal glands: Secondary | ICD-10-CM | POA: Diagnosis not present

## 2020-10-16 DIAGNOSIS — H25812 Combined forms of age-related cataract, left eye: Secondary | ICD-10-CM | POA: Diagnosis not present

## 2020-10-16 DIAGNOSIS — H524 Presbyopia: Secondary | ICD-10-CM | POA: Diagnosis not present

## 2020-10-16 DIAGNOSIS — H43813 Vitreous degeneration, bilateral: Secondary | ICD-10-CM | POA: Diagnosis not present

## 2020-10-16 DIAGNOSIS — H25811 Combined forms of age-related cataract, right eye: Secondary | ICD-10-CM | POA: Diagnosis not present

## 2020-10-20 ENCOUNTER — Other Ambulatory Visit: Payer: Self-pay | Admitting: Cardiovascular Disease

## 2020-10-20 DIAGNOSIS — M5033 Other cervical disc degeneration, cervicothoracic region: Secondary | ICD-10-CM | POA: Diagnosis not present

## 2020-10-20 DIAGNOSIS — M9903 Segmental and somatic dysfunction of lumbar region: Secondary | ICD-10-CM | POA: Diagnosis not present

## 2020-10-20 DIAGNOSIS — M5416 Radiculopathy, lumbar region: Secondary | ICD-10-CM | POA: Diagnosis not present

## 2020-10-20 DIAGNOSIS — M9901 Segmental and somatic dysfunction of cervical region: Secondary | ICD-10-CM | POA: Diagnosis not present

## 2020-10-26 DIAGNOSIS — I129 Hypertensive chronic kidney disease with stage 1 through stage 4 chronic kidney disease, or unspecified chronic kidney disease: Secondary | ICD-10-CM | POA: Diagnosis not present

## 2020-10-26 DIAGNOSIS — N2581 Secondary hyperparathyroidism of renal origin: Secondary | ICD-10-CM | POA: Diagnosis not present

## 2020-10-26 DIAGNOSIS — R809 Proteinuria, unspecified: Secondary | ICD-10-CM | POA: Diagnosis not present

## 2020-10-26 DIAGNOSIS — N1832 Chronic kidney disease, stage 3b: Secondary | ICD-10-CM | POA: Diagnosis not present

## 2020-11-02 DIAGNOSIS — I129 Hypertensive chronic kidney disease with stage 1 through stage 4 chronic kidney disease, or unspecified chronic kidney disease: Secondary | ICD-10-CM | POA: Diagnosis not present

## 2020-11-02 DIAGNOSIS — R809 Proteinuria, unspecified: Secondary | ICD-10-CM | POA: Diagnosis not present

## 2020-11-02 DIAGNOSIS — N2581 Secondary hyperparathyroidism of renal origin: Secondary | ICD-10-CM | POA: Diagnosis not present

## 2020-11-02 DIAGNOSIS — N1832 Chronic kidney disease, stage 3b: Secondary | ICD-10-CM | POA: Diagnosis not present

## 2020-11-03 ENCOUNTER — Ambulatory Visit: Payer: Medicare Other | Admitting: Allergy and Immunology

## 2020-11-06 ENCOUNTER — Other Ambulatory Visit: Payer: Self-pay | Admitting: Allergy and Immunology

## 2020-11-08 ENCOUNTER — Other Ambulatory Visit: Payer: Self-pay | Admitting: Family Medicine

## 2020-11-10 ENCOUNTER — Other Ambulatory Visit: Payer: Self-pay | Admitting: Family Medicine

## 2020-11-17 DIAGNOSIS — M9901 Segmental and somatic dysfunction of cervical region: Secondary | ICD-10-CM | POA: Diagnosis not present

## 2020-11-17 DIAGNOSIS — M5416 Radiculopathy, lumbar region: Secondary | ICD-10-CM | POA: Diagnosis not present

## 2020-11-17 DIAGNOSIS — M9903 Segmental and somatic dysfunction of lumbar region: Secondary | ICD-10-CM | POA: Diagnosis not present

## 2020-11-17 DIAGNOSIS — M5033 Other cervical disc degeneration, cervicothoracic region: Secondary | ICD-10-CM | POA: Diagnosis not present

## 2020-11-26 DIAGNOSIS — M5416 Radiculopathy, lumbar region: Secondary | ICD-10-CM | POA: Diagnosis not present

## 2020-11-26 DIAGNOSIS — M5033 Other cervical disc degeneration, cervicothoracic region: Secondary | ICD-10-CM | POA: Diagnosis not present

## 2020-11-26 DIAGNOSIS — M9901 Segmental and somatic dysfunction of cervical region: Secondary | ICD-10-CM | POA: Diagnosis not present

## 2020-11-26 DIAGNOSIS — M9903 Segmental and somatic dysfunction of lumbar region: Secondary | ICD-10-CM | POA: Diagnosis not present

## 2020-11-30 DIAGNOSIS — M5416 Radiculopathy, lumbar region: Secondary | ICD-10-CM | POA: Diagnosis not present

## 2020-11-30 DIAGNOSIS — M9903 Segmental and somatic dysfunction of lumbar region: Secondary | ICD-10-CM | POA: Diagnosis not present

## 2020-11-30 DIAGNOSIS — M5033 Other cervical disc degeneration, cervicothoracic region: Secondary | ICD-10-CM | POA: Diagnosis not present

## 2020-11-30 DIAGNOSIS — M9901 Segmental and somatic dysfunction of cervical region: Secondary | ICD-10-CM | POA: Diagnosis not present

## 2020-12-10 ENCOUNTER — Telehealth: Payer: Self-pay | Admitting: Emergency Medicine

## 2020-12-10 NOTE — Telephone Encounter (Signed)
Dr. Delton Coombes are you ok with patient coming in to office or would you rather change to televisit tomorrow??

## 2020-12-10 NOTE — Telephone Encounter (Signed)
I agree with changing this to a televisit

## 2020-12-10 NOTE — Telephone Encounter (Signed)
Patient notified, apt changed to televisit

## 2020-12-11 ENCOUNTER — Other Ambulatory Visit: Payer: Self-pay

## 2020-12-11 ENCOUNTER — Ambulatory Visit (INDEPENDENT_AMBULATORY_CARE_PROVIDER_SITE_OTHER): Payer: Medicare Other | Admitting: Emergency Medicine

## 2020-12-11 ENCOUNTER — Encounter: Payer: Self-pay | Admitting: Emergency Medicine

## 2020-12-11 DIAGNOSIS — J449 Chronic obstructive pulmonary disease, unspecified: Secondary | ICD-10-CM

## 2020-12-11 MED ORDER — BREZTRI AEROSPHERE 160-9-4.8 MCG/ACT IN AERO: 2.0000 | INHALATION_SPRAY | Freq: Two times a day (BID) | RESPIRATORY_TRACT | 4 refills | Status: DC

## 2020-12-11 NOTE — Progress Notes (Signed)
Virtual Visit via Telephone Note  I connected with Jodi Beasley on 12/11/20 at 11:30 AM EST by telephone and verified that I am speaking with the correct person using two identifiers.  Location: Patient: Home Provider: Office   I discussed the limitations, risks, security and privacy concerns of performing an evaluation and management service by telephone and the availability of in person appointments. I also discussed with the patient that there may be a patient responsible charge related to this service. The patient expressed understanding and agreed to proceed.   History of Present Illness: 75 year old woman with history of tobacco use and COPD.  Also with hypertension, CAD, GERD, hypothyroidism, chronic renal insufficiency.  We have been managing her on Breo.  Her COVID-19 vaccine is up-to-date.  She is also on Singulair, Zyrtec, Dymista for allergic rhinitis, follows with Dr. Neldon Mc.   Observations/Objective: She has been doing well without any SOB or cough until about 2-3 days ago. She has developed URI sx so this visit was converted to a tele visit. She has nasal gtt and cough now, clear drainage. No fever. She wonders if her Memory Dance is still helping her as much as it should. She is using her ProAir most evenings when she feels the the Less[place] may be running out.   Assessment and Plan: She is working on getting COVID tested, is self isolating   She is not sure that the Memory Dance is adequate for her COPD at this time.  I think it is reasonable to convert to ICS/LAMA/LABA, possibly twice daily scheduling to see if she gets more benefit.  We will send a prescription for Breztri to see if she has benefit.  Alternatively we could try changing to Trelegy going forward depending on response, cost, etc.  Follow Up Instructions: Need to set up office visit in 4 to 6 weeks   I discussed the assessment and treatment plan with the patient. The patient was provided an opportunity to ask questions and all  were answered. The patient agreed with the plan and demonstrated an understanding of the instructions.   The patient was advised to call back or seek an in-person evaluation if the symptoms worsen or if the condition fails to improve as anticipated.  I provided 16 minutes of non-face-to-face time during this encounter.   Collene Gobble, MD

## 2020-12-15 ENCOUNTER — Ambulatory Visit: Payer: Medicare Other | Admitting: Allergy and Immunology

## 2020-12-15 ENCOUNTER — Ambulatory Visit: Payer: Medicare Other | Admitting: Cardiovascular Disease

## 2020-12-16 DIAGNOSIS — Z20822 Contact with and (suspected) exposure to covid-19: Secondary | ICD-10-CM | POA: Diagnosis not present

## 2020-12-29 ENCOUNTER — Telehealth: Payer: Self-pay

## 2020-12-29 DIAGNOSIS — M9903 Segmental and somatic dysfunction of lumbar region: Secondary | ICD-10-CM | POA: Diagnosis not present

## 2020-12-29 DIAGNOSIS — M9901 Segmental and somatic dysfunction of cervical region: Secondary | ICD-10-CM | POA: Diagnosis not present

## 2020-12-29 DIAGNOSIS — M5416 Radiculopathy, lumbar region: Secondary | ICD-10-CM | POA: Diagnosis not present

## 2020-12-29 DIAGNOSIS — M5033 Other cervical disc degeneration, cervicothoracic region: Secondary | ICD-10-CM | POA: Diagnosis not present

## 2020-12-29 NOTE — Telephone Encounter (Signed)
Messaged patient through mychart about their influenza vaccine. Requested a call back to schedule for a nurse visit for the flu shot or for the dates of their flu vaccine if they have already received it.

## 2020-12-30 ENCOUNTER — Other Ambulatory Visit: Payer: Self-pay | Admitting: Family Medicine

## 2020-12-30 ENCOUNTER — Other Ambulatory Visit: Payer: Self-pay | Admitting: Allergy and Immunology

## 2020-12-30 ENCOUNTER — Other Ambulatory Visit: Payer: Self-pay | Admitting: Family

## 2020-12-31 ENCOUNTER — Telehealth: Payer: Self-pay | Admitting: Allergy and Immunology

## 2020-12-31 DIAGNOSIS — Z20822 Contact with and (suspected) exposure to covid-19: Secondary | ICD-10-CM | POA: Diagnosis not present

## 2020-12-31 NOTE — Telephone Encounter (Signed)
Patient called and needs to have azelastine-fluticasone inhaler called into [facility] in [place]. And she has appointment on Tuesday 01/05/2021 with dr Neldon Mc. [phone].

## 2020-12-31 NOTE — Telephone Encounter (Signed)
noted 

## 2020-12-31 NOTE — Telephone Encounter (Signed)
Please have Azula let us know about the results of her Covid test.  We can certainly refill her medications for a month and we can see her in this clinic for a visit pending the results of that test.  It would be best for her to be seen in person.

## 2020-12-31 NOTE — Telephone Encounter (Signed)
Patient states that back in November she tested positive for COVID and is currently having symptoms and will be tested to day. Patient states she should have test results back by Monday and wanted Dr. Neldon Mc to know. She is needing refills and advise a courtesy refill was already sent in back on 11/06/2020 for azelastine-fluticasone nasal spray. Please advise if patient could get a year supply if she comes in to be seen and if she does test positive is she okay to do a tele-visit. Please advise?

## 2021-01-01 ENCOUNTER — Other Ambulatory Visit: Payer: Self-pay | Admitting: Family

## 2021-01-05 ENCOUNTER — Other Ambulatory Visit: Payer: Self-pay

## 2021-01-05 ENCOUNTER — Encounter: Payer: Self-pay | Admitting: Allergy and Immunology

## 2021-01-05 ENCOUNTER — Ambulatory Visit (INDEPENDENT_AMBULATORY_CARE_PROVIDER_SITE_OTHER): Payer: Medicare Other | Admitting: Allergy and Immunology

## 2021-01-05 VITALS — BP 150/80 | HR 58 | Temp 97.0°F | Resp 16 | Ht 65.0 in | Wt 150.0 lb

## 2021-01-05 DIAGNOSIS — J301 Allergic rhinitis due to pollen: Secondary | ICD-10-CM | POA: Diagnosis not present

## 2021-01-05 DIAGNOSIS — F17210 Nicotine dependence, cigarettes, uncomplicated: Secondary | ICD-10-CM | POA: Diagnosis not present

## 2021-01-05 DIAGNOSIS — J3089 Other allergic rhinitis: Secondary | ICD-10-CM

## 2021-01-05 DIAGNOSIS — J449 Chronic obstructive pulmonary disease, unspecified: Secondary | ICD-10-CM | POA: Diagnosis not present

## 2021-01-05 DIAGNOSIS — H1010 Acute atopic conjunctivitis, unspecified eye: Secondary | ICD-10-CM

## 2021-01-05 DIAGNOSIS — J4489 Other specified chronic obstructive pulmonary disease: Secondary | ICD-10-CM

## 2021-01-05 MED ORDER — BREZTRI AEROSPHERE 160-9-4.8 MCG/ACT IN AERO: 2.0000 | INHALATION_SPRAY | Freq: Two times a day (BID) | RESPIRATORY_TRACT | 4 refills | Status: DC

## 2021-01-05 MED ORDER — MONTELUKAST SODIUM 10 MG PO TABS: ORAL_TABLET | ORAL | 2 refills | Status: DC

## 2021-01-05 MED ORDER — AZELASTINE-FLUTICASONE 137-50 MCG/ACT NA SUSP: NASAL | 5 refills | Status: DC

## 2021-01-05 MED ORDER — ALBUTEROL SULFATE HFA 108 (90 BASE) MCG/ACT IN AERS: INHALATION_SPRAY | RESPIRATORY_TRACT | 0 refills | Status: DC

## 2021-01-05 MED ORDER — CETIRIZINE HCL 10 MG PO TABS: ORAL_TABLET | ORAL | 2 refills | Status: DC

## 2021-01-05 NOTE — Patient Instructions (Addendum)
  1.  Allergen avoidance measures - pollen / mold  2.  Use nicotine substitutes to replace tobacco smoke exposure  3.  Treat and prevent inflammation:   A.  Breztri - 2 inhalations 2 times per day  B.  Dymista -1 spray each nostril twice a day  C.  Montelukast 10 mg -1 tablet 1 time per day  4.  If needed:   A.  Cetirizine 10 mg - 1-2 tablets 1-2 times per day (max = 40 mg /day)  B.  OTC Pataday -1 drop each eye 1 time per day  C.  Nasal saline   D.  Albuterol HFA -2 inhalations every 4-6 hours  5.  Return to clinic in 12 months or earlier if problem

## 2021-01-05 NOTE — Progress Notes (Signed)
[facility] [place]   Follow-up Note  Referring Provider: Leone Haven, MD Primary Provider: Leone Haven, MD Date of Office Visit: 01/05/2021  Subjective:   Jodi Beasley (DOB: June 13, 1946) is a 75 y.o. female who returns to the [facility] on 01/05/2021 in re-evaluation of the following:  HPI: Jodi Beasley returns to this clinic in reevaluation of allergic rhinoconjunctivitis in the context of COPD with asthma and heavy tobacco smoking.  I last saw in this clinic on 28 Apr 2020.  She has really done well since her last visit.  She continues to follow-up with Dr. Lamonte Sakai regarding her COPD with component of asthma and has recently been changed to a triple inhaler which has resulted in dramatic improvement regarding all of her respiratory tract symptoms and she now rarely uses a short acting bronchodilator.  It does not sound as though she has required a systemic steroid or antibiotic for any type of airway issue.  She still continues to smoke 14 cigarettes/day.  Her nose is really doing quite well while using Dymista on a consistent basis and she takes cetirizine at a dose of 5 mg 3 times per day.  She has had [facility] vaccines and contracted Covid in January 2022 manifested mostly as a cough and body aches for 1 week which has completely resolved.  She has received the flu vaccine.  Allergies as of 01/05/2021      Reactions   Benzocaine    Tongue swelling   Darvon [propoxyphene Hcl]    HA   Monosodium Glutamate Diarrhea   Neosporin [neomycin-bacitracin Zn-polymyx]    blisters   Nicoderm [nicotine]    arrythmia   Prednisone    Cannot tolerate high doses per patient- caused depressive thoughts      Medication List      albuterol 108 (90 Base) MCG/ACT inhaler Commonly known as: VENTOLIN HFA INHALE 2 PUFFS INTO THE LUNGS EVERY 6 HOURS AS NEEDED FOR WHEEZING OR SHORTNESS OF BREATH   aspirin 81 MG tablet Take 81 mg  by mouth daily.   atorvastatin 80 MG tablet Commonly known as: LIPITOR TAKE 1 TABLET(80 MG) BY MOUTH DAILY   Azelastine-Fluticasone 137-50 MCG/ACT Susp SHAKE LIQUID AND USE 1 SPRAY IN EACH NOSTRIL TWICE DAILY   Breztri Aerosphere 160-9-4.8 MCG/ACT Aero Generic drug: Budeson-Glycopyrrol-Formoterol Inhale 2 puffs into the lungs 2 (two) times daily.   buPROPion 150 MG 24 hr tablet Commonly known as: WELLBUTRIN XL TAKE 1 TABLET(150 MG) BY MOUTH DAILY   carisoprodol 350 MG tablet Commonly known as: SOMA TAKE 1 TABLET(350 MG) BY MOUTH TWICE DAILY   cetirizine 10 MG tablet Commonly known as: ZYRTEC Take 1-2 tablets 1-2 times daily as needed.   CoQ10 400 MG Caps Take 400 mg by mouth daily.   escitalopram 10 MG tablet Commonly known as: LEXAPRO TAKE 1 TABLET BY MOUTH EVERY DAY   ezetimibe 10 MG tablet Commonly known as: ZETIA TAKE 1 TABLET(10 MG) BY MOUTH DAILY   Garlic 123XX123 MG Caps Take by mouth.   levocetirizine 5 MG tablet Commonly known as: XYZAL TAKE 1 TABLET(5 MG) BY MOUTH EVERY EVENING   levothyroxine 100 MCG tablet Commonly known as: SYNTHROID TAKE 1 TABLET(100 MCG) BY MOUTH DAILY BEFORE BREAKFAST   metoprolol tartrate 25 MG tablet Commonly known as: LOPRESSOR TAKE 1 TABLET(25 MG) BY MOUTH TWICE DAILY   montelukast 10 MG tablet Commonly known as: SINGULAIR TAKE 1 TABLET(10 MG) BY MOUTH  AT BEDTIME   multivitamin capsule Take 1 capsule by mouth daily.   mupirocin ointment 2 % Commonly known as: Bactroban Apply 1 application topically 2 (two) times daily as needed.   nitroGLYCERIN 0.4 MG SL tablet Commonly known as: NITROSTAT Place 1 tablet (0.4 mg total) under the tongue every 5 (five) minutes as needed.   omeprazole 20 MG capsule Commonly known as: PRILOSEC TAKE 1 CAPSULE BY MOUTH EVERY DAY   psyllium 58.6 % packet Commonly known as: METAMUCIL Take 1 packet by mouth daily.   vitamin C 250 MG tablet Commonly known as: ASCORBIC ACID Take 250 mg  by mouth daily.   Vitamin D-3 25 MCG (1000 UT) Caps Take 1 capsule by mouth daily.       Past Medical History:  Diagnosis Date  . Arrhythmia   . Chicken pox   . COPD (chronic obstructive pulmonary disease) ([place])   . Coronary artery disease    NSTEMI in 09/2008. Cath : 80% RCA and 95% first diagonal. PCI and 2 DES placement  RCA and 1 DES to diagonal. Complicated by acute diagonal stent thrombosis . Treated by thrombectomy. Most recent nuclear stress test in 2010 showed no ischemia with normal EF.   Marland Kitchen Depression   . GERD (gastroesophageal reflux disease)   . Hay fever   . History of blood transfusion   . History of hypercholesterolemia   . History of syncope 2000   negative EP study  . Hyperlipidemia    intolerance to statins except Crestor.   . Hypertension   . Hypothyroid   . MI (myocardial infarction) ([place])   . Pneumonia, organism unspecified(486)   . Sepsis ([place]) 04/27/2018    Past Surgical History:  Procedure Laterality Date  . CORONARY ANGIOPLASTY WITH STENT PLACEMENT  2009   [facility], drug-eluting mid RCA,prox diag;  . TONSILLECTOMY    . TUBAL LIGATION      Review of systems negative except as noted in HPI / PMHx or noted below:  Review of Systems  Constitutional: Negative.   HENT: Negative.   Eyes: Negative.   Respiratory: Negative.   Cardiovascular: Negative.   Gastrointestinal: Negative.   Genitourinary: Negative.   Musculoskeletal: Negative.   Skin: Negative.   Neurological: Negative.   Endo/Heme/Allergies: Negative.   Psychiatric/Behavioral: Negative.      Objective:   Vitals:   01/05/21 1210  BP: (!) 150/80  Pulse: (!) 58  Resp: 16  Temp: (!) 97 F (36.1 C)  SpO2: 95%   Height: 5\' 5"  (165.1 cm)  Weight: 150 lb (68 kg)   Physical Exam Constitutional:      Appearance: She is not diaphoretic.  HENT:     Head: Normocephalic.     Right Ear: Tympanic membrane, ear canal and external ear normal.     Left Ear: Tympanic membrane, ear  canal and external ear normal.     Nose: Nose normal. No mucosal edema or rhinorrhea.     Mouth/Throat:     Mouth: Oropharynx is clear and moist and mucous membranes are normal.     Pharynx: Uvula midline. No oropharyngeal exudate.  Eyes:     Conjunctiva/sclera: Conjunctivae normal.  Neck:     Thyroid: No thyromegaly.     Trachea: Trachea normal. No tracheal tenderness or tracheal deviation.  Cardiovascular:     Rate and Rhythm: Normal rate and regular rhythm.     Heart sounds: Normal heart sounds, S1 normal and S2 normal. No murmur heard.   Pulmonary:  Effort: No respiratory distress.     Breath sounds: Normal breath sounds. No stridor. No wheezing or rales.  Musculoskeletal:        General: No edema.  Lymphadenopathy:     Head:     Right side of head: No tonsillar adenopathy.     Left side of head: No tonsillar adenopathy.     Cervical: No cervical adenopathy.  Skin:    Findings: No erythema or rash.     Nails: There is no clubbing.  Neurological:     Mental Status: She is alert.     Diagnostics:    Spirometry was performed and demonstrated an FEV1 of 1.41 at 62 % of predicted.  Assessment and Plan:   1. Perennial allergic rhinitis   2. Seasonal allergic rhinitis due to pollen   3. Seasonal allergic conjunctivitis   4. COPD with asthma ([place])   5. Heavy tobacco smoker >10 cigarettes per day     1.  Allergen avoidance measures - pollen / mold  2.  Use nicotine substitutes to replace tobacco smoke exposure  3.  Treat and prevent inflammation:   A.  Breztri - 2 inhalations 2 times per day  B.  Dymista -1 spray each nostril twice a day  C.  Montelukast 10 mg -1 tablet 1 time per day  4.  If needed:   A.  Cetirizine 10 mg - 1-2 tablets 1-2 times per day (max = 40 mg /day)  B.  OTC Pataday -1 drop each eye 1 time per day  C.  Nasal saline   D.  Albuterol HFA -2 inhalations every 4-6 hours  5.  Return to clinic in 12 months or earlier if problem  Overall  Jodi Beasley appears to be doing pretty good regarding her respiratory tract issue on her current plan of anti-inflammatory medications delivered to her airway.  We had a discussion about her smoking today and the reality is that she is not going to stop smoking as she has been smoking for 60 years.  She has tried all forms of nicotine substitutes in the past and has not found them to be effective.  Assuming she does well with the plan noted above I will see her back in this clinic in 1 year or earlier if there is a problem.  Allena Katz, MD Allergy / Immunology [place]

## 2021-01-06 ENCOUNTER — Encounter: Payer: Self-pay | Admitting: Allergy and Immunology

## 2021-01-06 ENCOUNTER — Other Ambulatory Visit: Payer: Self-pay | Admitting: Family Medicine

## 2021-01-13 NOTE — Telephone Encounter (Signed)
Patient called and needs a nurse to call and explain her inhalers to her. She not sure about the rescue inhaler is. [phone].

## 2021-01-13 NOTE — Telephone Encounter (Signed)
Called and went over medications with patient and she verbalized understanding. Patient was advised to call our office back if she had further questions or concerns in the future.

## 2021-01-15 ENCOUNTER — Encounter: Payer: Self-pay | Admitting: Family Medicine

## 2021-01-15 ENCOUNTER — Other Ambulatory Visit: Payer: Self-pay

## 2021-01-15 ENCOUNTER — Ambulatory Visit (INDEPENDENT_AMBULATORY_CARE_PROVIDER_SITE_OTHER): Payer: Medicare Other | Admitting: Family Medicine

## 2021-01-15 VITALS — BP 120/80 | HR 66 | Temp 98.6°F | Ht 65.0 in | Wt 163.6 lb

## 2021-01-15 DIAGNOSIS — G8929 Other chronic pain: Secondary | ICD-10-CM | POA: Diagnosis not present

## 2021-01-15 DIAGNOSIS — M5442 Lumbago with sciatica, left side: Secondary | ICD-10-CM | POA: Diagnosis not present

## 2021-01-15 DIAGNOSIS — R7303 Prediabetes: Secondary | ICD-10-CM | POA: Diagnosis not present

## 2021-01-15 DIAGNOSIS — Z1211 Encounter for screening for malignant neoplasm of colon: Secondary | ICD-10-CM | POA: Diagnosis not present

## 2021-01-15 DIAGNOSIS — Z8616 Personal history of COVID-19: Secondary | ICD-10-CM

## 2021-01-15 DIAGNOSIS — F4323 Adjustment disorder with mixed anxiety and depressed mood: Secondary | ICD-10-CM

## 2021-01-15 DIAGNOSIS — E7800 Pure hypercholesterolemia, unspecified: Secondary | ICD-10-CM

## 2021-01-15 DIAGNOSIS — E039 Hypothyroidism, unspecified: Secondary | ICD-10-CM

## 2021-01-15 DIAGNOSIS — M25512 Pain in left shoulder: Secondary | ICD-10-CM

## 2021-01-15 DIAGNOSIS — W19XXXA Unspecified fall, initial encounter: Secondary | ICD-10-CM

## 2021-01-15 DIAGNOSIS — M5441 Lumbago with sciatica, right side: Secondary | ICD-10-CM | POA: Diagnosis not present

## 2021-01-15 DIAGNOSIS — M25511 Pain in right shoulder: Secondary | ICD-10-CM | POA: Diagnosis not present

## 2021-01-15 DIAGNOSIS — M25519 Pain in unspecified shoulder: Secondary | ICD-10-CM | POA: Insufficient documentation

## 2021-01-15 HISTORY — DX: Personal history of COVID-19: Z86.16

## 2021-01-15 LAB — LIPID PANEL
Cholesterol: 162 mg/dL (ref 0–200)
HDL: 53 mg/dL (ref >39.00)
LDL Cholesterol: 73 mg/dL (ref 0–99)
NonHDL: 108.8
Total CHOL/HDL Ratio: 3
Triglycerides: 181 mg/dL — ABNORMAL HIGH (ref 0.0–149.0)
VLDL: 36.2 mg/dL (ref 0.0–40.0)

## 2021-01-15 LAB — COMPREHENSIVE METABOLIC PANEL
ALT: 19 U/L (ref 0–35)
AST: 21 U/L (ref 0–37)
Albumin: 4.2 g/dL (ref 3.5–5.2)
Alkaline Phosphatase: 69 U/L (ref 39–117)
BUN: 22 mg/dL (ref 6–23)
CO2: 27 mEq/L (ref 19–32)
Calcium: 9.4 mg/dL (ref 8.4–10.5)
Chloride: 100 mEq/L (ref 96–112)
Creatinine, Ser: 1.45 mg/dL — ABNORMAL HIGH (ref 0.40–1.20)
GFR: 35.71 mL/min — ABNORMAL LOW (ref >60.00)
Glucose, Bld: 84 mg/dL (ref 70–99)
Potassium: 4.8 mEq/L (ref 3.5–5.1)
Sodium: 134 mEq/L — ABNORMAL LOW (ref 135–145)
Total Bilirubin: 0.4 mg/dL (ref 0.2–1.2)
Total Protein: 7.2 g/dL (ref 6.0–8.3)

## 2021-01-15 LAB — TSH: TSH: 1.33 u[IU]/mL (ref 0.35–4.50)

## 2021-01-15 NOTE — Patient Instructions (Addendum)
Nice to see you. We will get you referred to sports medicine. Please monitor your shoulders. We will get lab work today.

## 2021-01-15 NOTE — Assessment & Plan Note (Addendum)
At this time seeing the chiropractor and taking her Jodi Beasley has this well controlled.  She will continue to monitor.

## 2021-01-15 NOTE — Assessment & Plan Note (Signed)
Suspected to be arthritis.  She will continue to take Tylenol 1000 mg every 8 hours as needed when this flares up.  If it becomes more bothersome we could consider alternative medications though her kidney function may limit our choices.

## 2021-01-15 NOTE — Assessment & Plan Note (Signed)
Adequately controlled at this time.  She will continue Lexapro 10 mg once daily and Wellbutrin 150 mg once daily.

## 2021-01-15 NOTE — Progress Notes (Signed)
[name], MD Phone: [phone]  Melisia Leming is a 75 y.o. female who presents today for f/u.  Chronic back pain: Patient notes she is doing well currently.  She sees her chiropractor every 3 weeks.  [employer] and has been on this for years with no issues.  No drowsiness.  History of COVID-19: Notes she had Covid in early January.  Describes it as a bad cold.  No persistent symptoms.  Depression/anxiety: Patient is she is okay at this time.  She did have a little bit of difficulty with this with her moved to [facility].  She continues on Lexapro and Wellbutrin.  No SI.  Shoulder arthritis: This is a chronic intermittent issue.  Typically develops pain in her shoulders with temperature changes.  She takes Tylenol 1000 mg every 8 hours as needed when she needs it.  Also using Aspercreme.  Falls: Patient reports 3 falls since our last visit.  She believes they occur because her right leg is shorter than her left.  One of the falls was on uneven concrete.  The other one was in the bathtub when she slipped.  She notes that she banged her knees up though otherwise had no injuries.  No head injury.  Social History   Tobacco Use  Smoking Status Current Every Day Smoker  . Packs/day: 1.00  . Years: 53.00  . Pack years: 53.00  . Types: E-cigarettes, Cigarettes  . Start date: 11/19/1960  Smokeless Tobacco Never Used  Tobacco Comment   currently smoking 15cigs per day    Current Outpatient Medications on File Prior to Visit  Medication Sig Dispense Refill  . albuterol (VENTOLIN HFA) 108 (90 Base) MCG/ACT inhaler INHALE 2 PUFFS INTO THE LUNGS EVERY 6 HOURS AS NEEDED FOR WHEEZING OR SHORTNESS OF BREATH 51 g 0  . aspirin 81 MG tablet Take 81 mg by mouth daily.    Marland Kitchen atorvastatin (LIPITOR) 80 MG tablet TAKE 1 TABLET(80 MG) BY MOUTH DAILY 90 tablet 3  . Azelastine-Fluticasone 137-50 MCG/ACT SUSP SHAKE LIQUID AND USE 1 SPRAY IN EACH NOSTRIL TWICE DAILY 23 g 5  .  Budeson-Glycopyrrol-Formoterol (BREZTRI AEROSPHERE) 160-9-4.8 MCG/ACT AERO Inhale 2 puffs into the lungs 2 (two) times daily. 10.7 g 4  . buPROPion (WELLBUTRIN XL) 150 MG 24 hr tablet TAKE 1 TABLET(150 MG) BY MOUTH DAILY 90 tablet 0  . carisoprodol (SOMA) 350 MG tablet TAKE 1 TABLET(350 MG) BY MOUTH TWICE DAILY 60 tablet 0  . cetirizine (ZYRTEC) 10 MG tablet Take 1-2 tablets 1-2 times daily as needed. 120 tablet 2  . Cholecalciferol (VITAMIN D-3) 1000 UNITS CAPS Take 1 capsule by mouth daily.    . Coenzyme Q10 (COQ10) 400 MG CAPS Take 400 mg by mouth daily.    Marland Kitchen escitalopram (LEXAPRO) 10 MG tablet TAKE 1 TABLET BY MOUTH EVERY DAY 30 tablet 2  . ezetimibe (ZETIA) 10 MG tablet TAKE 1 TABLET(10 MG) BY MOUTH DAILY 90 tablet 3  . Garlic 6203 MG CAPS Take by mouth.    . levocetirizine (XYZAL) 5 MG tablet TAKE 1 TABLET(5 MG) BY MOUTH EVERY EVENING 90 tablet 1  . levothyroxine (SYNTHROID) 100 MCG tablet TAKE 1 TABLET(100 MCG) BY MOUTH DAILY BEFORE BREAKFAST 90 tablet 1  . metoprolol tartrate (LOPRESSOR) 25 MG tablet TAKE 1 TABLET(25 MG) BY MOUTH TWICE DAILY 90 tablet 3  . montelukast (SINGULAIR) 10 MG tablet TAKE 1 TABLET(10 MG) BY MOUTH AT BEDTIME 90 tablet 2  . Multiple Vitamin (MULTIVITAMIN) capsule Take 1 capsule by mouth  daily.    . mupirocin ointment (BACTROBAN) 2 % Apply 1 application topically 2 (two) times daily as needed. 22 g 0  . nitroGLYCERIN (NITROSTAT) 0.4 MG SL tablet Place 1 tablet (0.4 mg total) under the tongue every 5 (five) minutes as needed. 25 tablet 3  . omeprazole (PRILOSEC) 20 MG capsule TAKE 1 CAPSULE BY MOUTH EVERY DAY 90 capsule 0  . psyllium (METAMUCIL) 58.6 % packet Take 1 packet by mouth daily.    . vitamin C (ASCORBIC ACID) 250 MG tablet Take 250 mg by mouth daily.     No current facility-administered medications on file prior to visit.     ROS see history of present illness  Objective  Physical Exam Vitals:   01/15/21 1109  BP: 120/80  Pulse: 66  Temp: 98.6  F (37 C)  SpO2: 99%    BP Readings from Last 3 Encounters:  01/15/21 120/80  01/05/21 (!) 150/80  09/15/20 (!) 159/81   Wt Readings from Last 3 Encounters:  01/15/21 163 lb 9.6 oz (74.2 kg)  01/05/21 150 lb (68 kg)  07/17/20 159 lb (72.1 kg)    Physical Exam Constitutional:      General: She is not in acute distress.    Appearance: She is not diaphoretic.  Cardiovascular:     Rate and Rhythm: Normal rate and regular rhythm.     Heart sounds: Normal heart sounds.  Pulmonary:     Effort: Pulmonary effort is normal.     Breath sounds: Normal breath sounds.  Musculoskeletal:        General: No edema.     Right lower leg: No edema.     Left lower leg: No edema.     Comments: It looks like there may be about a centimeter discrepancy in leg length with her right leg being shorter than the left  Skin:    General: Skin is warm and dry.  Neurological:     Mental Status: She is alert.      Assessment/Plan: Please see individual problem list.  Problem List Items Addressed This Visit    Adjustment disorder with mixed anxiety and depressed mood    Adequately controlled at this time.  She will continue Lexapro 10 mg once daily and Wellbutrin 150 mg once daily.      Chronic low back pain (Primary Area of Pain) (Bilateral) (L>R) (Chronic)    At this time seeing the chiropractor and taking her Manuela Neptune has this well controlled.  She will continue to monitor.      Chronic shoulder pain    Suspected to be arthritis.  She will continue to take Tylenol 1000 mg every 8 hours as needed when this flares up.  If it becomes more bothersome we could consider alternative medications though her kidney function may limit our choices.      Fall    Leg length discrepancy is likely playing a role in this.  We will refer to sports medicine to see if she meets criteria for custom inserts for her shoes for this.      History of COVID-19    Patient has recovered well.  She will monitor for any new  symptoms.      Hypercholesterolemia    Continue Zetia and Lipitor.  We will check labs.      Relevant Orders   Comp Met (CMET)   Lipid panel   Hypothyroidism    Check TSH.  Continue Synthroid.      Relevant Orders  TSH   Prediabetes    Check A1c.      Relevant Orders   HgB A1c    Other Visit Diagnoses    Colon cancer screening    -  Primary   Relevant Orders   Fecal occult blood, imunochemical       Health Maintenance: Patient declines colonoscopy.  She would prefer stool cards.  These were provided to her.     This visit occurred during the SARS-CoV-2 public health emergency.  Safety protocols were in place, including screening questions prior to the visit, additional usage of staff PPE, and extensive cleaning of exam room while observing appropriate contact time as indicated for disinfecting solutions.    [name], MD [place] [facility]

## 2021-01-15 NOTE — Assessment & Plan Note (Signed)
Check A1c. 

## 2021-01-15 NOTE — Assessment & Plan Note (Signed)
Patient has recovered well.  She will monitor for any new symptoms.

## 2021-01-15 NOTE — Assessment & Plan Note (Signed)
Leg length discrepancy is likely playing a role in this.  We will refer to sports medicine to see if she meets criteria for custom inserts for her shoes for this.

## 2021-01-15 NOTE — Assessment & Plan Note (Signed)
Continue Zetia and Lipitor.  We will check labs.

## 2021-01-15 NOTE — Assessment & Plan Note (Signed)
Check TSH.  Continue Synthroid. 

## 2021-01-18 LAB — HEMOGLOBIN A1C: Hgb A1c MFr Bld: 6.2 % (ref 4.6–6.5)

## 2021-01-19 ENCOUNTER — Encounter: Payer: Self-pay | Admitting: Family Medicine

## 2021-01-26 DIAGNOSIS — M5416 Radiculopathy, lumbar region: Secondary | ICD-10-CM | POA: Diagnosis not present

## 2021-01-26 DIAGNOSIS — M9901 Segmental and somatic dysfunction of cervical region: Secondary | ICD-10-CM | POA: Diagnosis not present

## 2021-01-26 DIAGNOSIS — M5033 Other cervical disc degeneration, cervicothoracic region: Secondary | ICD-10-CM | POA: Diagnosis not present

## 2021-01-26 DIAGNOSIS — M9903 Segmental and somatic dysfunction of lumbar region: Secondary | ICD-10-CM | POA: Diagnosis not present

## 2021-01-27 ENCOUNTER — Other Ambulatory Visit (INDEPENDENT_AMBULATORY_CARE_PROVIDER_SITE_OTHER): Payer: Medicare Other

## 2021-01-27 ENCOUNTER — Telehealth: Payer: Self-pay | Admitting: *Deleted

## 2021-01-27 ENCOUNTER — Encounter: Payer: Self-pay | Admitting: Family Medicine

## 2021-01-27 DIAGNOSIS — Z1211 Encounter for screening for malignant neoplasm of colon: Secondary | ICD-10-CM

## 2021-01-27 DIAGNOSIS — R195 Other fecal abnormalities: Secondary | ICD-10-CM

## 2021-01-27 LAB — FECAL OCCULT BLOOD, IMMUNOCHEMICAL: Fecal Occult Bld: POSITIVE — AB

## 2021-01-27 NOTE — Telephone Encounter (Signed)
CRITICAL VALUE STICKER  CRITICAL VALUE + IFOB  RECEIVER (on-site recipient of call): Jari Favre, CMA/XT  DATE & TIME NOTIFIED: 01/27/21 @ 10:38am  MESSENGER (representative from lab): [facility] lab)  MD NOTIFIED: Dr. Caryl Bis  TIME OF NOTIFICATION: 10:40am  RESPONSE:

## 2021-01-27 NOTE — Telephone Encounter (Signed)
Referral placed.

## 2021-01-27 NOTE — Addendum Note (Signed)
Addended by: Leone Haven on: 01/27/2021 03:35 PM   Modules accepted: Orders

## 2021-01-27 NOTE — Telephone Encounter (Signed)
Please see the result note and call the patient with my recommendations.

## 2021-01-27 NOTE — Telephone Encounter (Signed)
Provider aware of result:  Please let the patient know that her stool cards were positive for blood. She needs to see GI to have a colonoscopy to evaluate for any lesions or polyps.. I can place the referral once you speak with her.

## 2021-01-27 NOTE — Telephone Encounter (Signed)
I called and spoke with the patient and informed her of her stoll card results and she is okay with having a colonoscopy but she wants the procedure done at [facility]

## 2021-01-31 ENCOUNTER — Encounter: Payer: Self-pay | Admitting: Family Medicine

## 2021-02-03 ENCOUNTER — Other Ambulatory Visit: Payer: Self-pay | Admitting: Family Medicine

## 2021-02-03 ENCOUNTER — Ambulatory Visit: Payer: Medicare Other | Admitting: Emergency Medicine

## 2021-02-10 ENCOUNTER — Other Ambulatory Visit: Payer: Self-pay | Admitting: Family Medicine

## 2021-02-23 ENCOUNTER — Ambulatory Visit: Payer: PRIVATE HEALTH INSURANCE

## 2021-02-27 ENCOUNTER — Other Ambulatory Visit: Payer: Self-pay | Admitting: Family Medicine

## 2021-03-01 ENCOUNTER — Telehealth: Payer: Self-pay | Admitting: Allergy and Immunology

## 2021-03-01 MED ORDER — PREDNISONE 10 MG PO TABS: 10.0000 mg | ORAL_TABLET | Freq: Every day | ORAL | 0 refills | Status: DC

## 2021-03-01 NOTE — Telephone Encounter (Signed)
Please advise 

## 2021-03-01 NOTE — Telephone Encounter (Signed)
Called and spoke to patient and informed her of the note per Dr. Neldon Mc. The Prednisone has ben sent to patients pharmacy of choice in [place].

## 2021-03-01 NOTE — Telephone Encounter (Signed)
Please let Jodi Beasley know that we can give her prednisone at low dose using 10 mg 1 time per day for 10 days which will help her for this upcoming spring.

## 2021-03-01 NOTE — Telephone Encounter (Signed)
Pt states she is feeling miserable, has a cough, is sneezing and has sinus congestion due to pollen. Pt states she is taking cetirizine at 40 mg a day and does not feel any better. Pt also uses the Azelastine-Fluticasone when allergy symptoms get really bad, however pt does not like to use it as it leaves a bad taste in her mouth for the rest of the day.  Pt would like to know if she could be given a new regimen as her symptoms are not getting any better.   Please advise.

## 2021-03-12 ENCOUNTER — Ambulatory Visit: Payer: Medicare Other | Admitting: Cardiovascular Disease

## 2021-03-17 ENCOUNTER — Ambulatory Visit: Payer: PRIVATE HEALTH INSURANCE

## 2021-03-30 ENCOUNTER — Ambulatory Visit (INDEPENDENT_AMBULATORY_CARE_PROVIDER_SITE_OTHER): Payer: Medicare Other | Admitting: Emergency Medicine

## 2021-03-30 ENCOUNTER — Encounter: Payer: Self-pay | Admitting: Emergency Medicine

## 2021-03-30 ENCOUNTER — Other Ambulatory Visit: Payer: Self-pay

## 2021-03-30 DIAGNOSIS — J309 Allergic rhinitis, unspecified: Secondary | ICD-10-CM | POA: Diagnosis not present

## 2021-03-30 DIAGNOSIS — J449 Chronic obstructive pulmonary disease, unspecified: Secondary | ICD-10-CM | POA: Diagnosis not present

## 2021-03-30 DIAGNOSIS — Z720 Tobacco use: Secondary | ICD-10-CM | POA: Diagnosis not present

## 2021-03-30 MED ORDER — FLUTICASONE PROPIONATE 50 MCG/ACT NA SUSP: 2.0000 | Freq: Two times a day (BID) | NASAL | 2 refills | Status: DC

## 2021-03-30 NOTE — Assessment & Plan Note (Signed)
She had cut down to 10 cigarettes daily but is back up to 1 pack a day, ascribes this to stressors in her life.  We talked about try to cut down further but she does not think she can do it right now.  We will make a strategy to systematically decrease at her next office visit.  Once she accomplishes this we will work towards a quit date.

## 2021-03-30 NOTE — Assessment & Plan Note (Signed)
Please continue cetirizine as you have been taking it. Try taking your fluticasone nasal spray, 2 sprays each nostril 2 times daily every day. Discuss with Dr. Neldon Mc whether you might benefit from going back on Singulair.

## 2021-03-30 NOTE — Patient Instructions (Addendum)
Please continue Breztri 2 puffs twice a day.  Rinse and gargle after using. Keep your albuterol available to use 2 puffs when you need it for shortness of breath, chest tightness, wheezing. Please continue cetirizine as you have been taking it. Try taking your fluticasone nasal spray, 2 sprays each nostril 2 times daily every day. Discuss with Dr. Neldon Mc whether you might benefit from going back on Singulair. COVID-19 vaccine up-to-date We discussed smoking cessation today.  We will work on a strategy to first cut down beginning in next visit.  Once you have decreased we may be able to consider setting a quit date. Follow with Dr Lamonte Sakai in 4 months or sooner if you have any problems.

## 2021-03-30 NOTE — Progress Notes (Signed)
Subjective:    Patient ID: Jodi Beasley, female    DOB: 1946/11/10, 75 y.o.   MRN: [id number]  HPI  ROV 05/28/20 --this is a follow-up visit for patient with a history of COPD, allergic rhinitis.  She has severe obstruction by pulmonary function testing and hyperinflation on chest x-ray.  Currently managed on Breo.  She has albuterol which she uses approximately once a week, often at night. She believes that her breathing is doing well. She is sometimes limited with vacuuming or mowing the lawn.  Has to stop to rest.  No burdensome cough or wheezing.  No exacerbations She is currently smoking 15 cig a day, had gotten down to 13 a day.  She has seen Dr Neldon Mc - is allergic to molds. She is benefiting from starting zyrtec, singulair, dymista  She has had the COVID vaccine.   ROV 03/30/21 --75 year old woman with a history of active tobacco use (56 pack years), hypertension, CAD, depression, GERD, hypothyroidism, chronic renal insufficiency.  We follow her for severe COPD, sees Dr. Neldon Mc for chronic rhinitis and allergies.  She was on Xyzal, Singulair but not currently.  She is on Zyrtec and flonase, is off dymista. At her last visit I tried changing her Breo to [place].  She reports that her breathing has significantly benefited. Minimal albuterol use. Her worst problem right now is her allergic drainage, congestion, cough.  She moved to [facility] 11/2020 and has had significant increase in her allergy sx as above. She was started on prednisone 10 mg daily x 10 days by Dr Neldon Mc.  She is smoking about 20 cig a day (increased). She is not in a position to consider a quit date, dealing with illness in her sister.    Review of Systems As per HPI     Objective:   Physical Exam Vitals:   03/30/21 1124  BP: 116/72  Pulse: 61  Temp: 97.7 F (36.5 C)  TempSrc: Temporal  SpO2: 97%  Weight: 164 lb (74.4 kg)  Height: 5\' 6"  (1.676 m)   Gen: Pleasant, well-nourished, in no distress,  normal  affect  ENT: No lesions,  mouth clear,  oropharynx clear, no postnasal drip, strong voice  Neck: No JVD, no stridor  Lungs: No use of accessory muscles, no crackles or wheezing on normal respiration, mild low pitched wheeze on forced expiration  Cardiovascular: RRR, heart sounds normal, no murmur or gallops, no peripheral edema  Musculoskeletal: No deformities, no cyanosis or clubbing  Neuro: alert, awake, non focal  Skin: Warm, no lesions or rash      Assessment & Plan:  COPD (chronic obstructive pulmonary disease) with chronic bronchitis (HCC) Overall stable to improved since we changed Breo to [place].  No flares, no antibiotics.  She did have prednisone for mainly allergic symptoms since her last visit.  Plan to continue the [facility] and continue to manage contributing factors including her rhinitis.  Certainly smoking remains a big issue.  We talked about cutting down today.  Please continue Breztri 2 puffs twice a day.  Rinse and gargle after using. Keep your albuterol available to use 2 puffs when you need it for shortness of breath, chest tightness, wheezing. COVID-19 vaccine up-to-date We discussed smoking cessation today.  We will work on a strategy to first cut down beginning in next visit.  Once you have decreased we may be able to consider setting a quit date. Follow with Dr Lamonte Sakai in 4 months or sooner if you have any problems.  Allergic rhinitis Please continue cetirizine as you have been taking it. Try taking your fluticasone nasal spray, 2 sprays each nostril 2 times daily every day. Discuss with Dr. Neldon Mc whether you might benefit from going back on Singulair.  Tobacco abuse She had cut down to 10 cigarettes daily but is back up to 1 pack a day, ascribes this to stressors in her life.  We talked about try to cut down further but she does not think she can do it right now.  We will make a strategy to systematically decrease at her next office visit.  Once she  accomplishes this we will work towards a quit date.   Baltazar Apo, MD, PhD 03/30/2021, 12:01 PM [facility] [phone] or if no answer [phone]

## 2021-03-30 NOTE — Assessment & Plan Note (Signed)
Overall stable to improved since we changed Breo to [place].  No flares, no antibiotics.  She did have prednisone for mainly allergic symptoms since her last visit.  Plan to continue the [facility] and continue to manage contributing factors including her rhinitis.  Certainly smoking remains a big issue.  We talked about cutting down today.  Please continue Breztri 2 puffs twice a day.  Rinse and gargle after using. Keep your albuterol available to use 2 puffs when you need it for shortness of breath, chest tightness, wheezing. COVID-19 vaccine up-to-date We discussed smoking cessation today.  We will work on a strategy to first cut down beginning in next visit.  Once you have decreased we may be able to consider setting a quit date. Follow with Dr Lamonte Sakai in 4 months or sooner if you have any problems.

## 2021-03-30 NOTE — Addendum Note (Signed)
Addended by: Gavin Potters R on: 03/30/2021 12:06 PM   Modules accepted: Orders

## 2021-04-02 ENCOUNTER — Telehealth: Payer: Self-pay | Admitting: Family Medicine

## 2021-04-02 NOTE — Telephone Encounter (Signed)
lft vm for pt to call ofc to follow up on [facility] gastro referral. thanks

## 2021-04-03 ENCOUNTER — Other Ambulatory Visit: Payer: Self-pay | Admitting: Family Medicine

## 2021-04-12 ENCOUNTER — Ambulatory Visit (INDEPENDENT_AMBULATORY_CARE_PROVIDER_SITE_OTHER): Payer: Medicare Other

## 2021-04-12 VITALS — Ht 66.0 in | Wt 164.0 lb

## 2021-04-12 DIAGNOSIS — Z0000 Encounter for general adult medical examination without abnormal findings: Secondary | ICD-10-CM | POA: Diagnosis not present

## 2021-04-12 NOTE — Patient Instructions (Addendum)
Jodi Beasley , Thank you for taking time to come for your Medicare Wellness Visit. I appreciate your ongoing commitment to your health goals. Please review the following plan we discussed and let me know if I can assist you in the future.   These are the goals we discussed: Goals      Patient Stated   .  Maintain Healthy Lifestyle (pt-stated)      Healthy diet; portion control Stay active Stay hydrated       This is a list of the screening recommended for you and due dates:  Health Maintenance  Topic Date Due  . Colon Cancer Screening  04/19/2020  . Tetanus Vaccine  04/12/2022*  . Flu Shot  07/05/2021  . Stool Blood Test  01/27/2022  . Mammogram  08/13/2022  . DEXA scan (bone density measurement)  Completed  . COVID-19 Vaccine  Completed  . Hepatitis C Screening: USPSTF Recommendation to screen - Ages 59-79 yo.  Completed  . Pneumonia vaccines  Completed  . HPV Vaccine  Aged Out  *Topic was postponed. The date shown is not the original due date.   Advanced directives: End of life planning; Advance aging; Advanced directives discussed.  Copy of current HCPOA/Living Will requested.    Conditions/risks identified: none new  Follow up in one year for your annual wellness visit    Preventive Care 65 Years and Older, Female Preventive care refers to lifestyle choices and visits with your health care provider that can promote health and wellness. What does preventive care include?  A yearly physical exam. This is also called an annual well check.  Dental exams once or twice a year.  Routine eye exams. Ask your health care provider how often you should have your eyes checked.  Personal lifestyle choices, including:  Daily care of your teeth and gums.  Regular physical activity.  Eating a healthy diet.  Avoiding tobacco and drug use.  Limiting alcohol use.  Practicing safe sex.  Taking low-dose aspirin every day.  Taking vitamin and mineral supplements as  recommended by your health care provider. What happens during an annual well check? The services and screenings done by your health care provider during your annual well check will depend on your age, overall health, lifestyle risk factors, and family history of disease. Counseling  Your health care provider may ask you questions about your:  Alcohol use.  Tobacco use.  Drug use.  Emotional well-being.  Home and relationship well-being.  Sexual activity.  Eating habits.  History of falls.  Memory and ability to understand (cognition).  Work and work Statistician.  Reproductive health. Screening  You may have the following tests or measurements:  Height, weight, and BMI.  Blood pressure.  Lipid and cholesterol levels. These may be checked every 5 years, or more frequently if you are over 15 years old.  Skin check.  Lung cancer screening. You may have this screening every year starting at age 35 if you have a 30-pack-year history of smoking and currently smoke or have quit within the past 15 years.  Fecal occult blood test (FOBT) of the stool. You may have this test every year starting at age 56.  Flexible sigmoidoscopy or colonoscopy. You may have a sigmoidoscopy every 5 years or a colonoscopy every 10 years starting at age 21.  Hepatitis C blood test.  Hepatitis B blood test.  Sexually transmitted disease (STD) testing.  Diabetes screening. This is done by checking your blood sugar (glucose) after you  have not eaten for a while (fasting). You may have this done every 1-3 years.  Bone density scan. This is done to screen for osteoporosis. You may have this done starting at age 39.  Mammogram. This may be done every 1-2 years. Talk to your health care provider about how often you should have regular mammograms. Talk with your health care provider about your test results, treatment options, and if necessary, the need for more tests. Vaccines  Your health care  provider may recommend certain vaccines, such as:  Influenza vaccine. This is recommended every year.  Tetanus, diphtheria, and acellular pertussis (Tdap, Td) vaccine. You may need a Td booster every 10 years.  Zoster vaccine. You may need this after age 72.  Pneumococcal 13-valent conjugate (PCV13) vaccine. One dose is recommended after age 24.  Pneumococcal polysaccharide (PPSV23) vaccine. One dose is recommended after age 90. Talk to your health care provider about which screenings and vaccines you need and how often you need them. This information is not intended to replace advice given to you by your health care provider. Make sure you discuss any questions you have with your health care provider. Document Released: 12/18/2015 Document Revised: 08/10/2016 Document Reviewed: 09/22/2015 Elsevier Interactive Patient Education  2017 [place] Prevention in the Home Falls can cause injuries. They can happen to people of all ages. There are many things you can do to make your home safe and to help prevent falls. What can I do on the outside of my home?  Regularly fix the edges of walkways and driveways and fix any cracks.  Remove anything that might make you trip as you walk through a door, such as a raised step or threshold.  Trim any bushes or trees on the path to your home.  Use bright outdoor lighting.  Clear any walking paths of anything that might make someone trip, such as rocks or tools.  Regularly check to see if handrails are loose or broken. Make sure that both sides of any steps have handrails.  Any raised decks and porches should have guardrails on the edges.  Have any leaves, snow, or ice cleared regularly.  Use sand or salt on walking paths during winter.  Clean up any spills in your garage right away. This includes oil or grease spills. What can I do in the bathroom?  Use night lights.  Install grab bars by the toilet and in the tub and shower. Do  not use towel bars as grab bars.  Use non-skid mats or decals in the tub or shower.  If you need to sit down in the shower, use a plastic, non-slip stool.  Keep the floor dry. Clean up any water that spills on the floor as soon as it happens.  Remove soap buildup in the tub or shower regularly.  Attach bath mats securely with double-sided non-slip rug tape.  Do not have throw rugs and other things on the floor that can make you trip. What can I do in the bedroom?  Use night lights.  Make sure that you have a light by your bed that is easy to reach.  Do not use any sheets or blankets that are too big for your bed. They should not hang down onto the floor.  Have a firm chair that has side arms. You can use this for support while you get dressed.  Do not have throw rugs and other things on the floor that can make you trip. What  can I do in the kitchen?  Clean up any spills right away.  Avoid walking on wet floors.  Keep items that you use a lot in easy-to-reach places.  If you need to reach something above you, use a strong step stool that has a grab bar.  Keep electrical cords out of the way.  Do not use floor polish or wax that makes floors slippery. If you must use wax, use non-skid floor wax.  Do not have throw rugs and other things on the floor that can make you trip. What can I do with my stairs?  Do not leave any items on the stairs.  Make sure that there are handrails on both sides of the stairs and use them. Fix handrails that are broken or loose. Make sure that handrails are as long as the stairways.  Check any carpeting to make sure that it is firmly attached to the stairs. Fix any carpet that is loose or worn.  Avoid having throw rugs at the top or bottom of the stairs. If you do have throw rugs, attach them to the floor with carpet tape.  Make sure that you have a light switch at the top of the stairs and the bottom of the stairs. If you do not have them,  ask someone to add them for you. What else can I do to help prevent falls?  Wear shoes that:  Do not have high heels.  Have rubber bottoms.  Are comfortable and fit you well.  Are closed at the toe. Do not wear sandals.  If you use a stepladder:  Make sure that it is fully opened. Do not climb a closed stepladder.  Make sure that both sides of the stepladder are locked into place.  Ask someone to hold it for you, if possible.  Clearly mark and make sure that you can see:  Any grab bars or handrails.  First and last steps.  Where the edge of each step is.  Use tools that help you move around (mobility aids) if they are needed. These include:  Canes.  Walkers.  Scooters.  Crutches.  Turn on the lights when you go into a dark area. Replace any light bulbs as soon as they burn out.  Set up your furniture so you have a clear path. Avoid moving your furniture around.  If any of your floors are uneven, fix them.  If there are any pets around you, be aware of where they are.  Review your medicines with your doctor. Some medicines can make you feel dizzy. This can increase your chance of falling. Ask your doctor what other things that you can do to help prevent falls. This information is not intended to replace advice given to you by your health care provider. Make sure you discuss any questions you have with your health care provider. Document Released: 09/17/2009 Document Revised: 04/28/2016 Document Reviewed: 12/26/2014 Elsevier Interactive Patient Education  2017 [employer].

## 2021-04-12 NOTE — Progress Notes (Signed)
Subjective:   Jodi Beasley is a 75 y.o. female who presents for Medicare Annual (Subsequent) preventive examination.  Review of Systems    No ROS.  Medicare Wellness Virtual Visit.  Visual/audio telehealth visit, UTA vital signs.   See social history for additional risk factors.   Cardiac Risk Factors include: advanced age (>26men, >69 women)     Objective:    Today's Vitals   04/12/21 1407  Weight: 164 lb (74.4 kg)  Height: 5\' 6"  (1.676 m)   Body mass index is 26.47 kg/m.  Advanced Directives 04/12/2021 02/21/2020 02/19/2019 12/29/2018 05/18/2018 04/16/2018 02/16/2018  Does Patient Have a Medical Advance Directive? Yes Yes Yes Yes Yes Yes Yes  Type of Paramedic of [place];Living will [place];Living will [place];Living will [place];Living will [place];Living will Living will;Healthcare Power of [place];Living will  Does patient want to make changes to medical advance directive? No - Patient declined No - Patient declined No - Patient declined - - No - Patient declined No - Patient declined  Copy of [place] in Chart? No - copy requested No - copy requested No - copy requested - - No - copy requested No - copy requested    Current Medications (verified) Outpatient Encounter Medications as of 04/12/2021  Medication Sig  . albuterol (VENTOLIN HFA) 108 (90 Base) MCG/ACT inhaler INHALE 2 PUFFS INTO THE LUNGS EVERY 6 HOURS AS NEEDED FOR WHEEZING OR SHORTNESS OF BREATH  . aspirin 81 MG tablet Take 81 mg by mouth daily.  Marland Kitchen atorvastatin (LIPITOR) 80 MG tablet TAKE 1 TABLET(80 MG) BY MOUTH DAILY  . Budeson-Glycopyrrol-Formoterol (BREZTRI AEROSPHERE) 160-9-4.8 MCG/ACT AERO Inhale 2 puffs into the lungs 2 (two) times daily.  Marland Kitchen buPROPion (WELLBUTRIN XL) 150 MG 24 hr tablet TAKE 1 TABLET(150 MG) BY MOUTH DAILY  . carisoprodol (SOMA) 350 MG  tablet TAKE 1 TABLET(350 MG) BY MOUTH TWICE DAILY  . cetirizine (ZYRTEC) 10 MG tablet Take 1-2 tablets 1-2 times daily as needed.  . Cholecalciferol (VITAMIN D-3) 1000 UNITS CAPS Take 1 capsule by mouth daily.  . Coenzyme Q10 (COQ10) 400 MG CAPS Take 400 mg by mouth daily.  Marland Kitchen escitalopram (LEXAPRO) 10 MG tablet TAKE 1 TABLET BY MOUTH EVERY DAY  . ezetimibe (ZETIA) 10 MG tablet TAKE 1 TABLET(10 MG) BY MOUTH DAILY  . fluticasone (FLONASE) 50 MCG/ACT nasal spray Place 2 sprays into both nostrils in the morning and at bedtime.  . Garlic 3818 MG CAPS Take by mouth.  . levocetirizine (XYZAL) 5 MG tablet TAKE 1 TABLET(5 MG) BY MOUTH EVERY EVENING (Patient not taking: Reported on 03/30/2021)  . levothyroxine (SYNTHROID) 100 MCG tablet TAKE 1 TABLET(100 MCG) BY MOUTH DAILY BEFORE BREAKFAST  . metoprolol tartrate (LOPRESSOR) 25 MG tablet TAKE 1 TABLET(25 MG) BY MOUTH TWICE DAILY  . montelukast (SINGULAIR) 10 MG tablet TAKE 1 TABLET(10 MG) BY MOUTH AT BEDTIME (Patient not taking: Reported on 03/30/2021)  . Multiple Vitamin (MULTIVITAMIN) capsule Take 1 capsule by mouth daily.  . mupirocin ointment (BACTROBAN) 2 % Apply 1 application topically 2 (two) times daily as needed.  . nitroGLYCERIN (NITROSTAT) 0.4 MG SL tablet Place 1 tablet (0.4 mg total) under the tongue every 5 (five) minutes as needed.  Marland Kitchen omeprazole (PRILOSEC) 20 MG capsule TAKE 1 CAPSULE BY MOUTH EVERY DAY  . predniSONE (DELTASONE) 10 MG tablet Take 1 tablet (10 mg total) by mouth daily.  Marland Kitchen  psyllium (METAMUCIL) 58.6 % packet Take 1 packet by mouth daily.  . vitamin C (ASCORBIC ACID) 250 MG tablet Take 250 mg by mouth daily.   No facility-administered encounter medications on file as of 04/12/2021.    Allergies (verified) Benzocaine, Darvon [propoxyphene hcl], Monosodium glutamate, Neosporin [neomycin-bacitracin zn-polymyx], Nicoderm [nicotine], and Prednisone   History: Past Medical History:  Diagnosis Date  . Arrhythmia   . Chicken pox    . COPD (chronic obstructive pulmonary disease) ([place])   . Coronary artery disease    NSTEMI in 09/2008. Cath : 80% RCA and 95% first diagonal. PCI and 2 DES placement  RCA and 1 DES to diagonal. Complicated by acute diagonal stent thrombosis . Treated by thrombectomy. Most recent nuclear stress test in 2010 showed no ischemia with normal EF.   Marland Kitchen Depression   . GERD (gastroesophageal reflux disease)   . Hay fever   . History of blood transfusion   . History of hypercholesterolemia   . History of syncope 2000   negative EP study  . Hyperlipidemia    intolerance to statins except Crestor.   . Hypertension   . Hypothyroid   . MI (myocardial infarction) ([place])   . Pneumonia, organism unspecified(486)   . Sepsis ([place]) 04/27/2018   Past Surgical History:  Procedure Laterality Date  . CORONARY ANGIOPLASTY WITH STENT PLACEMENT  2009   [facility], drug-eluting mid RCA,prox diag;  . TONSILLECTOMY    . TUBAL LIGATION     Family History  Problem Relation Age of Onset  . Colon cancer Father   . Lung cancer Father        was a former smoker  . Cancer Father        lung  . Diabetes Father   . Hypertension Mother   . Hypertension Other   . Diabetes Other   . Allergic rhinitis Neg Hx   . Angioedema Neg Hx   . Asthma Neg Hx   . Atopy Neg Hx   . Eczema Neg Hx   . Immunodeficiency Neg Hx   . Urticaria Neg Hx    Social History   Socioeconomic History  . Marital status: Widowed    Spouse name: Not on file  . Number of children: Not on file  . Years of education: Not on file  . Highest education level: Not on file  Occupational History  . Not on file  Tobacco Use  . Smoking status: Current Every Day Smoker    Packs/day: 1.00    Years: 53.00    Pack years: 53.00    Types: E-cigarettes, Cigarettes    Start date: 11/19/1960  . Smokeless tobacco: Never Used  . Tobacco comment: 20 cigarettes smoked daily 03/30/21 ARJ   Vaping Use  . Vaping Use: Former  Substance and Sexual  Activity  . Alcohol use: Never    Alcohol/week: 1.0 standard drink    Types: 1 Standard drinks or equivalent per week  . Drug use: Never  . Sexual activity: Yes  Other Topics Concern  . Not on file  Social History Narrative   Lives with mother in [place]. 2 cats 2 dogs in home.   Recently moved from [facility].   Custody of 69month old.   Social Determinants of Health   Financial Resource Strain: Low Risk   . Difficulty of Paying Living Expenses: Not hard at all  Food Insecurity: No Food Insecurity  . Worried About Charity fundraiser in the Last Year: Never true  .  Ran Out of Food in the Last Year: Never true  Transportation Needs: No Transportation Needs  . Lack of Transportation (Medical): No  . Lack of Transportation (Non-Medical): No  Physical Activity: Not on file  Stress: No Stress Concern Present  . Feeling of Stress : Not at all  Social Connections: Unknown  . Frequency of Communication with Friends and Family: More than three times a week  . Frequency of Social Gatherings with Friends and Family: More than three times a week  . Attends Religious Services: Not on file  . Active Member of Clubs or Organizations: Not on file  . Attends Archivist Meetings: Not on file  . Marital Status: Not on file    Tobacco Counseling Ready to quit: Not Answered Counseling given: Not Answered Comment: 20 cigarettes smoked daily 03/30/21 ARJ    Clinical Intake:  Pre-visit preparation completed: Yes        Diabetes: No  How often do you need to have someone help you when you read instructions, pamphlets, or other written materials from your doctor or pharmacy?: 1 - Never   Interpreter Needed?: No      Activities of Daily Living In your present state of health, do you have any difficulty performing the following activities: 04/12/2021  Hearing? N  Vision? N  Difficulty concentrating or making decisions? N  Walking or climbing stairs? N  Dressing or bathing?  N  Doing errands, shopping? N  Preparing Food and eating ? N  Using the Toilet? N  In the past six months, have you accidently leaked urine? N  Do you have problems with loss of bowel control? N  Managing your Medications? N  Managing your Finances? N  Housekeeping or managing your Housekeeping? N  Some recent data might be hidden    Patient Care Team: Leone Haven, MD as PCP - General (Family Medicine) Croitoru, Dani Gobble, MD as PCP - Cardiology (Cardiology)  Indicate any recent Medical Services you may have received from other than [facility] providers in the past year (date may be approximate).     Assessment:   This is a routine wellness examination for Taryne.  I connected with [place] today by telephone and verified that I am speaking with the correct person using two identifiers. Location patient: home Location provider: work Persons participating in the virtual visit: patient, Marine scientist.    I discussed the limitations, risks, security and privacy concerns of performing an evaluation and management service by telephone and the availability of in person appointments. The patient expressed understanding and verbally consented to this telephonic visit.    Interactive audio and video telecommunications were attempted between this provider and patient, however failed, due to patient having technical difficulties OR patient did not have access to video capability.  We continued and completed visit with audio only.  Some vital signs may be absent or patient reported.   Hearing/Vision screen  Hearing Screening   125Hz  250Hz  500Hz  1000Hz  2000Hz  3000Hz  4000Hz  6000Hz  8000Hz   Right ear:           Left ear:           Comments: Patient is able to hear conversational tones without difficulty.  No issues reported.  Vision Screening Comments: Wears corrective lenses Visual acuity not assessed, virtual visit.  They have seen their ophthalmologist in the last 12 months.     Dietary issues  and exercise activities discussed: Current Exercise Habits: Home exercise routine  Healthy diet Good water intake  Goals Addressed              This Visit's Progress     Patient Stated   .  Maintain Healthy Lifestyle (pt-stated)        Healthy diet; portion control Stay active Stay hydrated      Depression Screen PHQ 2/9 Scores 04/12/2021 07/15/2020 02/21/2020 11/05/2019 02/19/2019 11/21/2018 02/16/2018  PHQ - 2 Score 0 0 0 0 - 0 0  PHQ- 9 Score - - - - - - -  Exception Documentation - - - - Other- indicate reason in comment box - -    Fall Risk Fall Risk  04/12/2021 01/15/2021 07/15/2020 02/21/2020 11/05/2019  Falls in the past year? 0 0 0 0 0  Comment - - - - -  Number falls in past yr: 0 0 0 - 0  Comment - - - - -  Injury with Fall? 0 - - - -  Comment - - - - -  Risk for fall due to : - - - - -  Follow up Falls evaluation completed Falls evaluation completed Falls evaluation completed Falls evaluation completed Falls evaluation completed  Comment - - - - -    FALL RISK PREVENTION PERTAINING TO THE HOME: Handrails in use when climbing stairs? Yes Home free of loose throw rugs in walkways, pet beds, electrical cords, etc? Yes  Adequate lighting in your home to reduce risk of falls? Yes    ASSISTIVE DEVICES UTILIZED TO PREVENT FALLS: Life alert? No  Use of a cane, walker or w/c? No   TIMED UP AND GO: Was the test performed? No . Virtual visit  Cognitive Function: Patient is alert and oriented x3. Denies difficulty focusing, making decisions, memory loss.  MMSE/6CIT deferred. Normal by direct communication/observation.   MMSE - Mini Mental State Exam 02/15/2017  Orientation to time 5  Orientation to Place 5  Registration 3  Attention/ Calculation 5  Recall 3  Language- name 2 objects 2  Language- repeat 1  Language- follow 3 step command 3  Language- read & follow direction 1  Write a sentence 1  Copy design 1  Total score 30     6CIT Screen 02/21/2020  02/19/2019 02/16/2018  What Year? 0 points 0 points 0 points  What month? 0 points 0 points 0 points  What time? 0 points 0 points 0 points  Count back from 20 0 points 0 points 0 points  Months in reverse 0 points 0 points 0 points  Repeat phrase 0 points 0 points 0 points  Total Score 0 0 0    Immunizations Immunization History  Administered Date(s) Administered  . Influenza Split 10/22/2012, 09/20/2014, 09/21/2015, 08/15/2016  . Influenza Whole 10/17/2013  . Influenza, High Dose Seasonal PF 08/23/2017, 08/22/2018, 08/06/2019  . Influenza-Unspecified 09/05/2015, 08/06/2019, 08/05/2020  . PFIZER(Purple Top)SARS-COV-2 Vaccination 01/25/2020, 02/18/2020, 09/10/2020  . Pneumococcal Conjugate-13 12/06/2007  . Pneumococcal Polysaccharide-23 11/19/2013  . Td 05/05/2010  . Zoster 04/20/2011   Health Maintenance Health Maintenance  Topic Date Due  . COLONOSCOPY (Pts 45-3yrs Insurance coverage will need to be confirmed)  04/19/2020  . TETANUS/TDAP  04/12/2022 (Originally 05/05/2020)  . INFLUENZA VACCINE  07/05/2021  . COLON CANCER SCREENING ANNUAL FOBT  01/27/2022  . MAMMOGRAM  08/13/2022  . DEXA SCAN  Completed  . COVID-19 Vaccine  Completed  . Hepatitis C Screening  Completed  . PNA vac Low Risk Adult  Completed  . HPV VACCINES  Aged Out   Colorectal cancer screening:  Type of screening: Colonoscopy. Completed 04/19/10. Repeat every 10 years Scheduled 07/15/2021 @ [facility].  Mammogram status: Completed 08/13/20. Repeat every year  Dental Screening: Recommended annual dental exams for proper oral hygiene.  Community Resource Referral / Chronic Care Management: CRR required this visit?  No   CCM required this visit?  No      Plan:   Keep all routine maintenance appointments.   I have personally reviewed and noted the following in the patient's chart:   . Medical and social history . Use of alcohol, tobacco or illicit drugs  . Current medications and supplements including  opioid prescriptions.  . Functional ability and status . Nutritional status . Physical activity . Advanced directives . List of other physicians . Hospitalizations, surgeries, and ER visits in previous 12 months . Vitals . Screenings to include cognitive, depression, and falls . Referrals and appointments  In addition, I have reviewed and discussed with patient certain preventive protocols, quality metrics, and best practice recommendations. A written personalized care plan for preventive services as well as general preventive health recommendations were provided to patient via mychart.     Varney Biles, LPN   05/06/8314

## 2021-04-19 ENCOUNTER — Ambulatory Visit (INDEPENDENT_AMBULATORY_CARE_PROVIDER_SITE_OTHER): Payer: Medicare Other | Admitting: Family Medicine

## 2021-04-19 ENCOUNTER — Other Ambulatory Visit: Payer: Self-pay

## 2021-04-19 ENCOUNTER — Encounter: Payer: Self-pay | Admitting: Family Medicine

## 2021-04-19 DIAGNOSIS — M5442 Lumbago with sciatica, left side: Secondary | ICD-10-CM | POA: Diagnosis not present

## 2021-04-19 DIAGNOSIS — F439 Reaction to severe stress, unspecified: Secondary | ICD-10-CM | POA: Insufficient documentation

## 2021-04-19 DIAGNOSIS — E7800 Pure hypercholesterolemia, unspecified: Secondary | ICD-10-CM | POA: Diagnosis not present

## 2021-04-19 DIAGNOSIS — M25532 Pain in left wrist: Secondary | ICD-10-CM | POA: Diagnosis not present

## 2021-04-19 DIAGNOSIS — G8929 Other chronic pain: Secondary | ICD-10-CM

## 2021-04-19 DIAGNOSIS — M5441 Lumbago with sciatica, right side: Secondary | ICD-10-CM

## 2021-04-19 DIAGNOSIS — J449 Chronic obstructive pulmonary disease, unspecified: Secondary | ICD-10-CM

## 2021-04-19 LAB — LDL CHOLESTEROL, DIRECT: Direct LDL: 80 mg/dL

## 2021-04-19 MED ORDER — CARISOPRODOL 350 MG PO TABS: ORAL_TABLET | ORAL | 0 refills | Status: DC

## 2021-04-19 NOTE — Assessment & Plan Note (Signed)
I offered support.  Advised if she developed depression or anxiety with this she should let us know.  She will continue with stress reducing activities.

## 2021-04-19 NOTE — Assessment & Plan Note (Signed)
Undetermined cause.  Certainly this could represent arthritis or an acute injury from her adjustment from the chiropractor.  X-ray to be completed today.

## 2021-04-19 NOTE — Assessment & Plan Note (Signed)
Seems to be improved on her new regimen.  She will continue to work on smoking cessation.  She will continue the breztri.  She will continue to see pulmonology.

## 2021-04-19 NOTE — Assessment & Plan Note (Signed)
Stable.  Refill of Soma provided.  She will monitor for drowsiness.

## 2021-04-19 NOTE — Progress Notes (Signed)
[name], MD Phone: [phone]  Jodi Beasley is a 75 y.o. female who presents today for f/u.  COPD: Medication compliance- taking breztri   Dyspnea- chronic and stable  Wheezing- minimal  Cough- minimal   Productive- clear sputum, minimal Patient notes her COPD symptoms are very well controlled with the [facility]. She notes her pulmonologist and allergist have things under control at this time.  They are talking about smoking cessation and she is working on tapering down on her cigarettes.  She is aware she needs to quit smoking.  Low back pain: This is chronic.  It is well controlled with periodic chiropractic adjustments and her Soma.  The soma does not make her drowsy.  No numbness or weakness.  Left wrist pain: Patient notes a couple weeks ago her chiropractor addressed her left wrist and she developed pain and joint enlargement at her [facility] joint.  Notes it hurts some now though it is not severe.  Stress: The patient's sister was recently diagnosed with cancer and has been ill and in and out of the hospital intermittently.  Patient notes no anxiety or depression.  She is doing lots of meditation and other activities to help with the stress.    Social History   Tobacco Use  Smoking Status Current Every Day Smoker  . Packs/day: 1.00  . Years: 53.00  . Pack years: 53.00  . Types: E-cigarettes, Cigarettes  . Start date: 11/19/1960  Smokeless Tobacco Never Used  Tobacco Comment   20 cigarettes smoked daily 03/30/21 ARJ     Current Outpatient Medications on File Prior to Visit  Medication Sig Dispense Refill  . albuterol (VENTOLIN HFA) 108 (90 Base) MCG/ACT inhaler INHALE 2 PUFFS INTO THE LUNGS EVERY 6 HOURS AS NEEDED FOR WHEEZING OR SHORTNESS OF BREATH 51 g 0  . aspirin 81 MG tablet Take 81 mg by mouth daily.    Marland Kitchen atorvastatin (LIPITOR) 80 MG tablet TAKE 1 TABLET(80 MG) BY MOUTH DAILY 90 tablet 3  . Budeson-Glycopyrrol-Formoterol (BREZTRI AEROSPHERE) 160-9-4.8 MCG/ACT  AERO Inhale 2 puffs into the lungs 2 (two) times daily. 10.7 g 4  . buPROPion (WELLBUTRIN XL) 150 MG 24 hr tablet TAKE 1 TABLET(150 MG) BY MOUTH DAILY 90 tablet 0  . cetirizine (ZYRTEC) 10 MG tablet Take 1-2 tablets 1-2 times daily as needed. 120 tablet 2  . Cholecalciferol (VITAMIN D-3) 1000 UNITS CAPS Take 1 capsule by mouth daily.    . Coenzyme Q10 (COQ10) 400 MG CAPS Take 400 mg by mouth daily.    Marland Kitchen escitalopram (LEXAPRO) 10 MG tablet TAKE 1 TABLET BY MOUTH EVERY DAY 30 tablet 2  . ezetimibe (ZETIA) 10 MG tablet TAKE 1 TABLET(10 MG) BY MOUTH DAILY 90 tablet 3  . fluticasone (FLONASE) 50 MCG/ACT nasal spray Place 2 sprays into both nostrils in the morning and at bedtime. 16 g 2  . Garlic 8413 MG CAPS Take by mouth.    . levocetirizine (XYZAL) 5 MG tablet TAKE 1 TABLET(5 MG) BY MOUTH EVERY EVENING 90 tablet 1  . levothyroxine (SYNTHROID) 100 MCG tablet TAKE 1 TABLET(100 MCG) BY MOUTH DAILY BEFORE BREAKFAST 90 tablet 1  . metoprolol tartrate (LOPRESSOR) 25 MG tablet TAKE 1 TABLET(25 MG) BY MOUTH TWICE DAILY 90 tablet 3  . montelukast (SINGULAIR) 10 MG tablet TAKE 1 TABLET(10 MG) BY MOUTH AT BEDTIME 90 tablet 2  . Multiple Vitamin (MULTIVITAMIN) capsule Take 1 capsule by mouth daily.    . mupirocin ointment (BACTROBAN) 2 % Apply 1 application topically 2 (  two) times daily as needed. 22 g 0  . nitroGLYCERIN (NITROSTAT) 0.4 MG SL tablet Place 1 tablet (0.4 mg total) under the tongue every 5 (five) minutes as needed. 25 tablet 3  . omeprazole (PRILOSEC) 20 MG capsule TAKE 1 CAPSULE BY MOUTH EVERY DAY 90 capsule 1  . vitamin C (ASCORBIC ACID) 250 MG tablet Take 250 mg by mouth daily.    . predniSONE (DELTASONE) 10 MG tablet Take 1 tablet (10 mg total) by mouth daily. (Patient not taking: Reported on 04/19/2021) 10 tablet 0  . psyllium (METAMUCIL) 58.6 % packet Take 1 packet by mouth daily. (Patient not taking: Reported on 04/19/2021)     No current facility-administered medications on file prior to  visit.     ROS see history of present illness  Objective  Physical Exam Vitals:   04/19/21 1058  BP: 130/80  Pulse: 66  Temp: (!) 97.3 F (36.3 C)  SpO2: 98%    BP Readings from Last 3 Encounters:  04/19/21 130/80  03/30/21 116/72  01/15/21 120/80   Wt Readings from Last 3 Encounters:  04/19/21 163 lb 9.6 oz (74.2 kg)  04/12/21 164 lb (74.4 kg)  03/30/21 164 lb (74.4 kg)    Physical Exam Constitutional:      General: She is not in acute distress.    Appearance: She is not diaphoretic.  Cardiovascular:     Rate and Rhythm: Normal rate and regular rhythm.     Heart sounds: Normal heart sounds.  Pulmonary:     Effort: Pulmonary effort is normal.     Breath sounds: Normal breath sounds.  Musculoskeletal:       Hands:  Skin:    General: Skin is warm and dry.  Neurological:     Mental Status: She is alert.      Assessment/Plan: Please see individual problem list.  Problem List Items Addressed This Visit    Chronic low back pain (Primary Area of Pain) (Bilateral) (L>R) (Chronic)    Stable.  Refill of Soma provided.  She will monitor for drowsiness.      Relevant Medications   carisoprodol (SOMA) 350 MG tablet   COPD (chronic obstructive pulmonary disease) with chronic bronchitis (HCC)    Seems to be improved on her new regimen.  She will continue to work on smoking cessation.  She will continue the breztri.  She will continue to see pulmonology.      Hypercholesterolemia    Recheck direct LDL.  This was very near her goal on last check.  She will continue Zetia and Lipitor at this time.      Relevant Orders   Direct LDL   Acute pain of left wrist    Undetermined cause.  Certainly this could represent arthritis or an acute injury from her adjustment from the chiropractor.  X-ray to be completed today.      Relevant Orders   DG Wrist Complete Left   Stress    I offered support.  Advised if she developed depression or anxiety with this she should let  us know.  She will continue with stress reducing activities.          Health Maintenance: reports her colonoscopy is scheduled for august.   Return in about 3 months (around 07/20/2021) for Chronic back pain follow-up.  This visit occurred during the SARS-CoV-2 public health emergency.  Safety protocols were in place, including screening questions prior to the visit, additional usage of staff PPE, and extensive cleaning of  exam room while observing appropriate contact time as indicated for disinfecting solutions.    [name], MD [place]

## 2021-04-19 NOTE — Patient Instructions (Signed)
Nice to see you. Will contact you with your x-ray result. Please monitor for drowsiness with the Soma.

## 2021-04-19 NOTE — Assessment & Plan Note (Signed)
Recheck direct LDL.  This was very near her goal on last check.  She will continue Zetia and Lipitor at this time.

## 2021-04-21 ENCOUNTER — Telehealth: Payer: Self-pay | Admitting: Family Medicine

## 2021-04-21 ENCOUNTER — Other Ambulatory Visit: Payer: Self-pay | Admitting: Family Medicine

## 2021-04-21 DIAGNOSIS — G8929 Other chronic pain: Secondary | ICD-10-CM

## 2021-04-21 NOTE — Telephone Encounter (Signed)
RX Refill:soma Last Seen:04-19-21 Last ordered:04-19-21

## 2021-04-21 NOTE — Telephone Encounter (Signed)
Left detailed message informing the below.

## 2021-04-21 NOTE — Telephone Encounter (Signed)
This was just sent to her pharmacy. Please CALL the patient and let her know it should be at her pharmacy already.

## 2021-04-21 NOTE — Telephone Encounter (Signed)
error 

## 2021-04-26 ENCOUNTER — Other Ambulatory Visit: Payer: Self-pay

## 2021-04-26 ENCOUNTER — Ambulatory Visit (INDEPENDENT_AMBULATORY_CARE_PROVIDER_SITE_OTHER): Payer: Medicare Other

## 2021-04-26 ENCOUNTER — Other Ambulatory Visit: Payer: Medicare Other

## 2021-04-26 DIAGNOSIS — M25532 Pain in left wrist: Secondary | ICD-10-CM

## 2021-04-28 ENCOUNTER — Other Ambulatory Visit: Payer: Self-pay

## 2021-04-28 ENCOUNTER — Telehealth: Payer: Self-pay | Admitting: Family Medicine

## 2021-04-28 DIAGNOSIS — I10 Essential (primary) hypertension: Secondary | ICD-10-CM

## 2021-04-28 MED ORDER — METOPROLOL TARTRATE 25 MG PO TABS: ORAL_TABLET | ORAL | 3 refills | Status: DC

## 2021-04-28 NOTE — Telephone Encounter (Signed)
LVM for the patient to call back to ask what pharmacy she wanted the Lopressor to go to. I called [facility] on [address] and they stated she no longer got her medications there.  Lesia Hausen

## 2021-04-28 NOTE — Progress Notes (Signed)
Patient changed pharmacies.  Antonius Hartlage,cma

## 2021-04-28 NOTE — Telephone Encounter (Signed)
Patient said her pharmacy sent over to office 2 requests to have her metoprolol tartrate (LOPRESSOR) 25 MG tablet refilled. She is requesting a refill. She also called about her xray results.

## 2021-04-28 NOTE — Telephone Encounter (Signed)
I called the pharmacy and they stated the patient had no refills and was no longer using their pharmacy.  I called and the patient gave me her correct pharmacy and a new rx for Lopressor was sent .  Cari Vandeberg,cma

## 2021-05-05 ENCOUNTER — Other Ambulatory Visit: Payer: Self-pay

## 2021-05-05 MED ORDER — CETIRIZINE HCL 10 MG PO TABS: ORAL_TABLET | ORAL | 1 refills | Status: DC

## 2021-06-08 NOTE — Progress Notes (Signed)
Cardiology Office Note    Date:  06/09/2021   ID:  Jodi Beasley, DOB [birth year], MRN [id number]  PCP:  Leone Haven, MD  Cardiologist:   Sanda Klein, MD   Chief Complaint  Patient presents with   Coronary Artery Disease        History of Present Illness:  Jodi Beasley is a 75 y.o. female with coronary disease, mild sinus node dysfunction, hypertension, CKD 3 and hyperlipidemia returning for follow-up.  She has not had any serious physical problems, but has been under emotional stress.  She sold her house and moved in with her sister, who was not long thereafter diagnosed with cancer and has just completed treatment with chemotherapy and radiation therapy.  She has responded well to treatment, but it appears that she presented with widespread disease and expected to have recurrence.  Jodi Beasley worries about her sister's health, but also about her own living circumstances should her sisters condition worsen.  She continues to smoke, but is "working" with Dr. Lamonte Sakai on this.  She almost never uses rescue albuterol, but takes long-acting bronchodilators (Breztri twice daily).  She has a nonproductive cough.  She is taking prednisone frequently.  She has NYHA functional class II exertional dyspnea, unchanged.  She has not had any angina at rest or with activity.  She has not taken nitroglycerin in years.  She denies dizziness, syncope, palpitations.  She has noticed that her home pulse oximeter occasionally shows heart rates in the 30s (which I suspect is artifactual and due to ventricular bigeminy).  She has not had any major health problems this year.  She has routine lab follow-up with Dr. Juleen China her nephrologist in [place].  She developed itching after he prescribed a  drug to protect her kidneys (I presume this was an ARB), but does not remember the exact agent.  Her most recent creatinine on 04/15/2021 was improved at 1.25.  She is on maximum dose atorvastatin and ezetimibe and  her recent LDL in February was close to target at 73.  Her HDL is pretty good at 53, triglycerides borderline high at 181.  Hemoglobin A1c was 6.2%. She is on max dose atorvastatin and ezetimibe. Untreated LDL was 153.   Roughly 8 years have passed since Jodi Beasley, her longtime partner and also on of our patients, passed away in their home.  She has coronary artery disease. In October 2009 she had a small NSTEMI and received 2 drug-eluting stents (RCA and diagonal artery). Immediately following this she had a small lateral STEMI due to acute diagonal stent thrombosis, treated with thrombectomy and balloon angioplasty. She had a normal nuclear stress test in 2014 and has normal left ventricular systolic function.  Past Medical History:  Diagnosis Date   Arrhythmia    Chicken pox    COPD (chronic obstructive pulmonary disease) ([place])    Coronary artery disease    NSTEMI in 09/2008. Cath : 80% RCA and 95% first diagonal. PCI and 2 DES placement  RCA and 1 DES to diagonal. Complicated by acute diagonal stent thrombosis . Treated by thrombectomy. Most recent nuclear stress test in 2010 showed no ischemia with normal EF.    Depression    GERD (gastroesophageal reflux disease)    Hay fever    History of blood transfusion    History of hypercholesterolemia    History of syncope 2000   negative EP study   Hyperlipidemia    intolerance to statins except Crestor.    Hypertension  Hypothyroid    MI (myocardial infarction) ([place])    Pneumonia, organism unspecified(486)    Sepsis ([place]) 04/27/2018    Past Surgical History:  Procedure Laterality Date   CORONARY ANGIOPLASTY WITH STENT PLACEMENT  2009   [facility], drug-eluting mid RCA,prox diag;   TONSILLECTOMY     TUBAL LIGATION      Current Medications: Outpatient Medications Prior to Visit  Medication Sig Dispense Refill   albuterol (VENTOLIN HFA) 108 (90 Base) MCG/ACT inhaler INHALE 2 PUFFS INTO THE LUNGS EVERY 6 HOURS AS NEEDED FOR  WHEEZING OR SHORTNESS OF BREATH 51 g 0   aspirin 81 MG tablet Take 81 mg by mouth daily.     atorvastatin (LIPITOR) 80 MG tablet TAKE 1 TABLET(80 MG) BY MOUTH DAILY 90 tablet 3   Budeson-Glycopyrrol-Formoterol (BREZTRI AEROSPHERE) 160-9-4.8 MCG/ACT AERO Inhale 2 puffs into the lungs 2 (two) times daily. 10.7 g 4   buPROPion (WELLBUTRIN XL) 150 MG 24 hr tablet TAKE 1 TABLET(150 MG) BY MOUTH DAILY 90 tablet 0   carisoprodol (SOMA) 350 MG tablet TAKE 1 TABLET(350 MG) BY MOUTH TWICE DAILY 60 tablet 0   cetirizine (ZYRTEC) 10 MG tablet Take 1-2 tablets 1-2 times daily as needed. 120 tablet 1   Cholecalciferol (VITAMIN D-3) 1000 UNITS CAPS Take 1 capsule by mouth daily.     Coenzyme Q10 (COQ10) 400 MG CAPS Take 400 mg by mouth daily.     escitalopram (LEXAPRO) 10 MG tablet TAKE 1 TABLET BY MOUTH EVERY DAY 30 tablet 2   ezetimibe (ZETIA) 10 MG tablet TAKE 1 TABLET(10 MG) BY MOUTH DAILY 90 tablet 3   fluticasone (FLONASE) 50 MCG/ACT nasal spray Place 2 sprays into both nostrils in the morning and at bedtime. 16 g 2   Garlic 0938 MG CAPS Take by mouth.     levocetirizine (XYZAL) 5 MG tablet TAKE 1 TABLET(5 MG) BY MOUTH EVERY EVENING 90 tablet 1   metoprolol tartrate (LOPRESSOR) 25 MG tablet TAKE 1 TABLET(25 MG) BY MOUTH TWICE DAILY 90 tablet 3   montelukast (SINGULAIR) 10 MG tablet TAKE 1 TABLET(10 MG) BY MOUTH AT BEDTIME 90 tablet 2   Multiple Vitamin (MULTIVITAMIN) capsule Take 1 capsule by mouth daily.     mupirocin ointment (BACTROBAN) 2 % Apply 1 application topically 2 (two) times daily as needed. 22 g 0   nitroGLYCERIN (NITROSTAT) 0.4 MG SL tablet Place 1 tablet (0.4 mg total) under the tongue every 5 (five) minutes as needed. 25 tablet 3   omeprazole (PRILOSEC) 20 MG capsule TAKE 1 CAPSULE BY MOUTH EVERY DAY 90 capsule 1   vitamin C (ASCORBIC ACID) 250 MG tablet Take 250 mg by mouth daily.     levothyroxine (SYNTHROID) 100 MCG tablet TAKE 1 TABLET(100 MCG) BY MOUTH DAILY BEFORE BREAKFAST 90  tablet 1   predniSONE (DELTASONE) 10 MG tablet Take 1 tablet (10 mg total) by mouth daily. 10 tablet 0   psyllium (METAMUCIL) 58.6 % packet Take 1 packet by mouth daily.     No facility-administered medications prior to visit.     Allergies:   Benzocaine, Darvon [propoxyphene hcl], Monosodium glutamate, Neosporin [neomycin-bacitracin zn-polymyx], Nicoderm [nicotine], and Prednisone   Social History   Socioeconomic History   Marital status: Widowed    Spouse name: Not on file   Number of children: Not on file   Years of education: Not on file   Highest education level: Not on file  Occupational History   Not on file  Tobacco Use  Smoking status: Every Day    Packs/day: 1.00    Years: 53.00    Pack years: 53.00    Types: E-cigarettes, Cigarettes    Start date: 11/19/1960   Smokeless tobacco: Never   Tobacco comments:    20 cigarettes smoked daily 03/30/21 ARJ   Vaping Use   Vaping Use: Former  Substance and Sexual Activity   Alcohol use: Never    Alcohol/week: 1.0 standard drink    Types: 1 Standard drinks or equivalent per week   Drug use: Never   Sexual activity: Yes  Other Topics Concern   Not on file  Social History Narrative   Lives with mother in [place]. 2 cats 2 dogs in home.   Recently moved from [employer]   Custody of 78month old.   Social Determinants of Health   Financial Resource Strain: Low Risk    Difficulty of Paying Living Expenses: Not hard at all  Food Insecurity: No Food Insecurity   Worried About Charity fundraiser in the Last Year: Never true   [place] in the Last Year: Never true  Transportation Needs: No Transportation Needs   Lack of Transportation (Medical): No   Lack of Transportation (Non-Medical): No  Physical Activity: Not on file  Stress: No Stress Concern Present   Feeling of Stress : Not at all  Social Connections: Unknown   Frequency of Communication with Friends and Family: More than three times a week   Frequency of  Social Gatherings with Friends and Family: More than three times a week   Attends Religious Services: Not on Electrical engineer or Organizations: Not on file   Attends Archivist Meetings: Not on file   Marital Status: Not on file     Family History:  The patient's family history includes Cancer in her father; Colon cancer in her father; Diabetes in her father and another family member; Hypertension in her mother and another family member; Lung cancer in her father.   ROS:   Please see the history of present illness.    ROS All other systems are reviewed and are negative.   PHYSICAL EXAM:   VS:  BP 130/60   Pulse 71   Ht 5\' 5"  (1.651 m)   Wt 161 lb 9.6 oz (73.3 kg)   SpO2 92%   BMI 26.89 kg/m      General: Alert, oriented x3, no distress, appears well Head: no evidence of trauma, PERRL, EOMI, no exophtalmos or lid lag, no myxedema, no xanthelasma; normal ears, nose and oropharynx Neck: normal jugular venous pulsations and no hepatojugular reflux; brisk carotid pulses without delay and no carotid bruits Chest: clear to auscultation, no signs of consolidation by percussion or palpation, normal fremitus, symmetrical and full respiratory excursions Cardiovascular: normal position and quality of the apical impulse, regular bigeminal rhythm, normal first and second heart sounds, no murmurs, rubs or gallops Abdomen: no tenderness or distention, no masses by palpation, no abnormal pulsatility or arterial bruits, normal bowel sounds, no hepatosplenomegaly Extremities: no clubbing, cyanosis or edema; 2+ radial, ulnar and brachial pulses bilaterally; 2+ right femoral, posterior tibial and dorsalis pedis pulses; 2+ left femoral, posterior tibial and dorsalis pedis pulses; no subclavian or femoral bruits Neurological: grossly nonfocal Psych: Normal mood and affect   Wt Readings from Last 3 Encounters:  06/09/21 161 lb 9.6 oz (73.3 kg)  04/19/21 163 lb 9.6 oz (74.2 kg)   04/12/21 164 lb (74.4 kg)  Studies/Labs Reviewed:   EKG:  EKG is ordered today.  It shows sinus rhythm with PVCs in a pattern of bigeminy, QTc 460 ms. Recent Labs: 01/15/2021: ALT 19; BUN 22; Creatinine, Ser 1.45; Potassium 4.8; Sodium 134; TSH 1.33   Lipid Panel    Component Value Date/Time   CHOL 162 01/15/2021 1153   TRIG 181.0 (H) 01/15/2021 1153   HDL 53.00 01/15/2021 1153   CHOLHDL 3 01/15/2021 1153   VLDL 36.2 01/15/2021 1153   LDLCALC 73 01/15/2021 1153   LDLDIRECT 80.0 04/19/2021 1129    Additional studies/ records that were reviewed today include:  Notes from Dr. Caryl Bis   ASSESSMENT:    1. Coronary artery disease involving native coronary artery of native heart without angina pectoris   2. Essential hypertension   3. Mixed hyperlipidemia   4. Palpitations   5. COPD (chronic obstructive pulmonary disease) with chronic bronchitis (HCC)   6. Smoking      PLAN:  In order of problems listed above:  CAD: She does not have angina. History of stents in the right coronary artery and diagonal artery in 2009. Normal nuclear stress test in 2014.  Smoking cessation remains her biggest unaddressed risk factor, her metabolic parameters are fair. HTN: Well-controlled.  Only on low-dose metoprolol. HLP: LDL is very close to target of 70 or less.  She is on the maximum dose of atorvastatin as well as ezetimibe.  She has not had any objective evidence of progression of CAD in over 10 years.  I do not think we should add more medications.   Palpitations: In the past when we tried to completely wean off her beta-blocker her palpitations became very troublesome.  Interestingly, today she has ventricular bigeminy but is unaware of the arrhythmia, probably because its been going on for a while. COPD: Seeing Dr. Lamonte Sakai.  FEV1 in April 2017 was 1.3 L/56% of predicted. Make sure she understands the distinction between the long-acting beta agonist (which should be used schedule  twice daily) and her rescue inhaler (albuterol).  Smoking: Long-term, the goal remains complete smoking cessation.  She is not able to commit to that at this time due to social/family stressors.    Medication Adjustments/Labs and Tests Ordered: Current medicines are reviewed at length with the patient today.  Concerns regarding medicines are outlined above.  Medication changes, Labs and Tests ordered today are listed in the Patient Instructions below. Patient Instructions  Medication Instructions:  No changes *If you need a refill on your cardiac medications before your next appointment, please call your pharmacy*   Lab Work: None ordered If you have labs (blood work) drawn today and your tests are completely normal, you will receive your results only by: [place] (if you have MyChart) OR A paper copy in the mail If you have any lab test that is abnormal or we need to change your treatment, we will call you to review the results.   Testing/Procedures: None ordered   Follow-Up: At [facility], you and your health needs are our priority.  As part of our continuing mission to provide you with exceptional heart care, we have created designated Provider Care Teams.  These Care Teams include your primary Cardiologist (physician) and Advanced Practice Providers (APPs -  Physician Assistants and Nurse Practitioners) who all work together to provide you with the care you need, when you need it.  We recommend signing up for the patient portal called "MyChart".  Sign up information is provided on this  After Visit Summary.  MyChart is used to connect with patients for Virtual Visits (Telemedicine).  Patients are able to view lab/test results, encounter notes, upcoming appointments, etc.  Non-urgent messages can be sent to your provider as well.   To learn more about what you can do with MyChart, go to [email or web address].    Your next appointment:   12 month(s)  The format for  your next appointment:   In Person  Provider:   You may see Sanda Klein, MD or one of the following Advanced Practice Providers on your designated Care Team:   Almyra Deforest, PA-C Fabian Sharp, [place] or  Roby Lofts, PA-C    Signed, Sanda Klein, MD  06/09/2021 4:33 PM    [facility] [address], [place], [place]  [address] Phone: [phone]; Fax: [phone]

## 2021-06-09 ENCOUNTER — Other Ambulatory Visit: Payer: Self-pay

## 2021-06-09 ENCOUNTER — Encounter: Payer: Self-pay | Admitting: Cardiovascular Disease

## 2021-06-09 ENCOUNTER — Ambulatory Visit (INDEPENDENT_AMBULATORY_CARE_PROVIDER_SITE_OTHER): Payer: Medicare Other | Admitting: Cardiovascular Disease

## 2021-06-09 VITALS — BP 130/60 | HR 71 | Ht 65.0 in | Wt 161.6 lb

## 2021-06-09 DIAGNOSIS — R002 Palpitations: Secondary | ICD-10-CM

## 2021-06-09 DIAGNOSIS — I10 Essential (primary) hypertension: Secondary | ICD-10-CM | POA: Diagnosis not present

## 2021-06-09 DIAGNOSIS — I2510 Atherosclerotic heart disease of native coronary artery without angina pectoris: Secondary | ICD-10-CM | POA: Diagnosis not present

## 2021-06-09 DIAGNOSIS — E782 Mixed hyperlipidemia: Secondary | ICD-10-CM

## 2021-06-09 DIAGNOSIS — F17200 Nicotine dependence, unspecified, uncomplicated: Secondary | ICD-10-CM

## 2021-06-09 DIAGNOSIS — J449 Chronic obstructive pulmonary disease, unspecified: Secondary | ICD-10-CM

## 2021-06-09 NOTE — Patient Instructions (Signed)

## 2021-06-10 ENCOUNTER — Other Ambulatory Visit: Payer: Self-pay | Admitting: Family Medicine

## 2021-06-10 DIAGNOSIS — G8929 Other chronic pain: Secondary | ICD-10-CM

## 2021-06-10 DIAGNOSIS — M5441 Lumbago with sciatica, right side: Secondary | ICD-10-CM

## 2021-06-10 NOTE — Telephone Encounter (Signed)
RX Refill:Soma Last Seen:04-19-21 Last ordered:04-19-21

## 2021-06-17 ENCOUNTER — Telehealth: Payer: Self-pay | Admitting: Family Medicine

## 2021-06-17 NOTE — Telephone Encounter (Signed)
PT called into the office to state they are wanting to find out if they should or should not be taking their synthroid as they looked at their med list and did not see it. However they feel like they should be and if so they would like a new script wrote for it.

## 2021-06-17 NOTE — Telephone Encounter (Signed)
Pt called and states that you can disregard last message about synthroid. She is taking this medication and got confused. FYI

## 2021-06-23 ENCOUNTER — Other Ambulatory Visit: Payer: Self-pay | Admitting: Emergency Medicine

## 2021-07-06 ENCOUNTER — Other Ambulatory Visit: Payer: Self-pay | Admitting: Family Medicine

## 2021-07-13 ENCOUNTER — Other Ambulatory Visit: Payer: Self-pay | Admitting: Family Medicine

## 2021-07-21 ENCOUNTER — Other Ambulatory Visit: Payer: Self-pay

## 2021-07-21 ENCOUNTER — Encounter: Payer: Self-pay | Admitting: Family Medicine

## 2021-07-21 ENCOUNTER — Ambulatory Visit (INDEPENDENT_AMBULATORY_CARE_PROVIDER_SITE_OTHER): Payer: Medicare Other | Admitting: Family Medicine

## 2021-07-21 VITALS — BP 110/60 | HR 72 | Temp 98.7°F | Ht 65.0 in | Wt 160.0 lb

## 2021-07-21 DIAGNOSIS — Z780 Asymptomatic menopausal state: Secondary | ICD-10-CM | POA: Diagnosis not present

## 2021-07-21 DIAGNOSIS — G8929 Other chronic pain: Secondary | ICD-10-CM

## 2021-07-21 DIAGNOSIS — F4323 Adjustment disorder with mixed anxiety and depressed mood: Secondary | ICD-10-CM

## 2021-07-21 DIAGNOSIS — Z1231 Encounter for screening mammogram for malignant neoplasm of breast: Secondary | ICD-10-CM

## 2021-07-21 DIAGNOSIS — M5441 Lumbago with sciatica, right side: Secondary | ICD-10-CM

## 2021-07-21 DIAGNOSIS — Z720 Tobacco use: Secondary | ICD-10-CM

## 2021-07-21 DIAGNOSIS — M5442 Lumbago with sciatica, left side: Secondary | ICD-10-CM | POA: Diagnosis not present

## 2021-07-21 DIAGNOSIS — I2510 Atherosclerotic heart disease of native coronary artery without angina pectoris: Secondary | ICD-10-CM | POA: Diagnosis not present

## 2021-07-21 MED ORDER — ESCITALOPRAM OXALATE 20 MG PO TABS: 10.0000 mg | ORAL_TABLET | Freq: Two times a day (BID) | ORAL | 1 refills | Status: DC

## 2021-07-21 NOTE — Assessment & Plan Note (Signed)
Worsened recently.  We will increase her Lexapro to 10 mg twice daily.  If she has any side effects with this she will let us know.  Discussed risk of sleep issues and weight gain with this.  She will continue Wellbutrin 150 mg once daily.  Follow-up in 8 weeks.

## 2021-07-21 NOTE — Patient Instructions (Signed)
Nice to see you. Please call [phone] to schedule your mammogram and bone density scan. We will increase your Lexapro to 10 mg twice daily.  If you notice the side effects that we discussed please let me know. Please get your Shingrix vaccine and COVID booster at the pharmacy.

## 2021-07-21 NOTE — Assessment & Plan Note (Signed)
This is a chronic issue.  Has been somewhat worse recently with stressors causing tensing of her muscles.  I did advise that the Manuela Neptune is to be used for her back discomfort only and not to be used as treatment for stress and anxiety.  Discussed risk of addiction and dependence on this type of medication.  She will resume taking it as prescribed.

## 2021-07-21 NOTE — Assessment & Plan Note (Signed)
I recommended smoking cessation.  The patient is aware of the reasons to quit smoking.  She is not ready to quit at this time.  She will let us know if she changes her mind.

## 2021-07-21 NOTE — Progress Notes (Signed)
[name], MD Phone: [phone]  Jodi Beasley is a 75 y.o. female who presents today for f/u.  Anxiety/depression: Patient notes this has not been great recently.  She is had more stress.  Her sister has gotten worse with her lung cancer.  Currently has pneumonitis and is on antibiotics.  Patient notes this is breaking her heart.  She is angry about it.  At times she feels like she is going to explode.  No SI.  She has been taking Lexapro 5 mg twice daily.  She felt fuzzy on 10 mg once daily previously.  She continues on Wellbutrin 150 mg daily.  She notes she has been taking the Soma half a tablet twice daily to help with her stress.  Chronic back pain: Patient notes that she has had to see the chiropractor more frequently to get her vertebrae put back in place as the stress has been causing her to tense up quite a bit.  She continues on her Soma.  That is beneficial for her back.  Tobacco abuse: Patient continues to smoke.  She is not ready to quit.  Social History   Tobacco Use  Smoking Status Every Day   Packs/day: 1.00   Years: 53.00   Pack years: 53.00   Types: E-cigarettes, Cigarettes   Start date: 11/19/1960  Smokeless Tobacco Never  Tobacco Comments   20 cigarettes smoked daily 03/30/21 ARJ     Current Outpatient Medications on File Prior to Visit  Medication Sig Dispense Refill   albuterol (VENTOLIN HFA) 108 (90 Base) MCG/ACT inhaler INHALE 2 PUFFS INTO THE LUNGS EVERY 6 HOURS AS NEEDED FOR WHEEZING OR SHORTNESS OF BREATH 51 g 0   aspirin 81 MG tablet Take 81 mg by mouth daily.     atorvastatin (LIPITOR) 80 MG tablet TAKE 1 TABLET(80 MG) BY MOUTH DAILY 90 tablet 3   Budeson-Glycopyrrol-Formoterol (BREZTRI AEROSPHERE) 160-9-4.8 MCG/ACT AERO Inhale 2 puffs into the lungs 2 (two) times daily. 10.7 g 4   buPROPion (WELLBUTRIN XL) 150 MG 24 hr tablet TAKE 1 TABLET(150 MG) BY MOUTH DAILY 90 tablet 0   carisoprodol (SOMA) 350 MG tablet TAKE 1 TABLET(350 MG) BY MOUTH  TWICE DAILY 60 tablet 0   cetirizine (ZYRTEC) 10 MG tablet Take 1-2 tablets 1-2 times daily as needed. 120 tablet 1   Cholecalciferol (VITAMIN D-3) 1000 UNITS CAPS Take 1 capsule by mouth daily.     Coenzyme Q10 (COQ10) 400 MG CAPS Take 400 mg by mouth daily.     ezetimibe (ZETIA) 10 MG tablet TAKE 1 TABLET(10 MG) BY MOUTH DAILY 90 tablet 3   fluticasone (FLONASE) 50 MCG/ACT nasal spray SHAKE LIQUID AND USE 2 SPRAYS IN EACH NOSTRIL IN THE MORNING AND AT BEDTIME 16 g 2   Garlic 123XX123 MG CAPS Take by mouth.     levocetirizine (XYZAL) 5 MG tablet TAKE 1 TABLET(5 MG) BY MOUTH EVERY EVENING 90 tablet 1   metoprolol tartrate (LOPRESSOR) 25 MG tablet TAKE 1 TABLET(25 MG) BY MOUTH TWICE DAILY 90 tablet 3   montelukast (SINGULAIR) 10 MG tablet TAKE 1 TABLET(10 MG) BY MOUTH AT BEDTIME 90 tablet 2   Multiple Vitamin (MULTIVITAMIN) capsule Take 1 capsule by mouth daily.     mupirocin ointment (BACTROBAN) 2 % Apply 1 application topically 2 (two) times daily as needed. 22 g 0   nitroGLYCERIN (NITROSTAT) 0.4 MG SL tablet Place 1 tablet (0.4 mg total) under the tongue every 5 (five) minutes as needed. 25 tablet 3  omeprazole (PRILOSEC) 20 MG capsule TAKE 1 CAPSULE BY MOUTH EVERY DAY 90 capsule 1   vitamin C (ASCORBIC ACID) 250 MG tablet Take 250 mg by mouth daily.     No current facility-administered medications on file prior to visit.     ROS see history of present illness  Objective  Physical Exam Vitals:   07/21/21 1100  BP: 110/60  Pulse: 72  Temp: 98.7 F (37.1 C)  SpO2: 98%    BP Readings from Last 3 Encounters:  07/21/21 110/60  06/09/21 130/60  04/19/21 130/80   Wt Readings from Last 3 Encounters:  07/21/21 160 lb (72.6 kg)  06/09/21 161 lb 9.6 oz (73.3 kg)  04/19/21 163 lb 9.6 oz (74.2 kg)    Physical Exam Constitutional:      General: She is not in acute distress.    Appearance: She is not diaphoretic.  Cardiovascular:     Rate and Rhythm: Normal rate and regular rhythm.      Heart sounds: Normal heart sounds.  Pulmonary:     Effort: Pulmonary effort is normal.     Breath sounds: Normal breath sounds.  Musculoskeletal:     Right lower leg: No edema.     Left lower leg: No edema.  Skin:    General: Skin is warm and dry.  Neurological:     Mental Status: She is alert.     Assessment/Plan: Please see individual problem list.  Problem List Items Addressed This Visit     Chronic low back pain (Primary Area of Pain) (Bilateral) (L>R) - Primary (Chronic)    This is a chronic issue.  Has been somewhat worse recently with stressors causing tensing of her muscles.  I did advise that the Manuela Neptune is to be used for her back discomfort only and not to be used as treatment for stress and anxiety.  Discussed risk of addiction and dependence on this type of medication.  She will resume taking it as prescribed.      Relevant Medications   escitalopram (LEXAPRO) 20 MG tablet   Adjustment disorder with mixed anxiety and depressed mood    Worsened recently.  We will increase her Lexapro to 10 mg twice daily.  If she has any side effects with this she will let us know.  Discussed risk of sleep issues and weight gain with this.  She will continue Wellbutrin 150 mg once daily.  Follow-up in 8 weeks.      Relevant Medications   escitalopram (LEXAPRO) 20 MG tablet   Tobacco abuse    I recommended smoking cessation.  The patient is aware of the reasons to quit smoking.  She is not ready to quit at this time.  She will let us know if she changes her mind.      Other Visit Diagnoses     Postmenopausal estrogen deficiency       Relevant Orders   DG Bone Density   Encounter for screening mammogram for malignant neoplasm of breast       Relevant Orders   MM 3D SCREEN BREAST BILATERAL       Return in about 8 weeks (around 09/15/2021) for depression.  This visit occurred during the SARS-CoV-2 public health emergency.  Safety protocols were in place, including screening  questions prior to the visit, additional usage of staff PPE, and extensive cleaning of exam room while observing appropriate contact time as indicated for disinfecting solutions.    [name], MD [facility] -  [employer]

## 2021-07-29 ENCOUNTER — Other Ambulatory Visit: Payer: Self-pay | Admitting: Allergy and Immunology

## 2021-07-30 ENCOUNTER — Other Ambulatory Visit: Payer: Self-pay | Admitting: Family Medicine

## 2021-07-30 DIAGNOSIS — G8929 Other chronic pain: Secondary | ICD-10-CM

## 2021-07-31 ENCOUNTER — Ambulatory Visit: Payer: Self-pay

## 2021-08-01 ENCOUNTER — Encounter: Payer: Self-pay | Admitting: Family Medicine

## 2021-08-02 ENCOUNTER — Other Ambulatory Visit: Payer: Self-pay

## 2021-08-02 ENCOUNTER — Encounter: Payer: Self-pay | Admitting: Family Medicine

## 2021-08-02 ENCOUNTER — Ambulatory Visit
Admission: RE | Admit: 2021-08-02 | Discharge: 2021-08-02 | Disposition: A | Discharge status: Home / Self Care | Payer: Medicare Other | Source: Ambulatory Visit | Attending: Physician Assistant | Admitting: Physician Assistant

## 2021-08-02 VITALS — BP 129/88 | HR 64 | Temp 99.0°F | Resp 18

## 2021-08-02 DIAGNOSIS — J302 Other seasonal allergic rhinitis: Secondary | ICD-10-CM | POA: Insufficient documentation

## 2021-08-02 DIAGNOSIS — J449 Chronic obstructive pulmonary disease, unspecified: Secondary | ICD-10-CM | POA: Insufficient documentation

## 2021-08-02 DIAGNOSIS — Z20822 Contact with and (suspected) exposure to covid-19: Secondary | ICD-10-CM | POA: Diagnosis not present

## 2021-08-02 LAB — SARS CORONAVIRUS 2 (TAT 6-24 HRS): SARS Coronavirus 2: NEGATIVE

## 2021-08-02 NOTE — Discharge Instructions (Addendum)
-  Symptoms and exam are likely consistent with seasonal allergies.  Some mild redness with clear drainage to the throat.  No signs of fever or active infection -Recommend taking the Zyrtec at night and using Flonase in the morning.  Additionally can use a nasal saline rinse or Nettie pot like flush to help clear out the nasal passages.  This will help clear up the drainage. -Voice again is likely related to the seasonal allergies and the drainage which should improve. -Believe you should be able to visit your sister in the next day or so.

## 2021-08-02 NOTE — ED Triage Notes (Signed)
Pt presents today with c/o nasal congestion and hoarseness x 3 days. Denies fever. No home Covid tests.

## 2021-08-02 NOTE — ED Provider Notes (Signed)
[facility]    CSN: [id number] Arrival date & time: 08/02/21  1043      History   Chief Complaint Chief Complaint  Patient presents with   Nasal Congestion   Hoarse    HPI Jodi Beasley is a 75 y.o. female.   Patient is a 75 year old female with past medical history as below including COPD and coronary artery disease status post stenting.  Patient presents with chief complaint of upper respiratory symptoms that started Friday.  She reports she had sore throat and runny nose and overall felt pretty bad on Friday but felt better starting yesterday and today but today she has lost her voice.  She reports she has had a lot of recent exposure as her sister is fighting lung cancer has been with her for ER visits, cancer center and hospital visits.  Additionally they are watching her sister's granddaughter who is 55 who has had a recent cold.  Patient states her Lexapro was increased last week for her anxiety.  She had been taking Coricidin until yesterday which was stopped due to the Lexapro interaction.  She does have a history of sinus issues yearly and reports that the increased pollen levels in general are likely also contributing to her symptoms.   Past Medical History:  Diagnosis Date   Arrhythmia    Chicken pox    COPD (chronic obstructive pulmonary disease) ([place])    Coronary artery disease    NSTEMI in 09/2008. Cath : 80% RCA and 95% first diagonal. PCI and 2 DES placement  RCA and 1 DES to diagonal. Complicated by acute diagonal stent thrombosis . Treated by thrombectomy. Most recent nuclear stress test in 2010 showed no ischemia with normal EF.    Depression    GERD (gastroesophageal reflux disease)    Hay fever    History of blood transfusion    History of hypercholesterolemia    History of syncope 2000   negative EP study   Hyperlipidemia    intolerance to statins except Crestor.    Hypertension    Hypothyroid    MI (myocardial infarction) ([place])     Pneumonia, organism unspecified(486)    Sepsis ([place]) 04/27/2018    Patient Active Problem List   Diagnosis Date Noted   Acute pain of left wrist 04/19/2021   Stress 04/19/2021   Chronic shoulder pain 01/15/2021   History of COVID-19 01/15/2021   Prediabetes 01/15/2021   Bradycardia 07/15/2020   Nose pain 03/10/2020   Dental anomaly 02/22/2019   GERD (gastroesophageal reflux disease) 11/21/2018   Palpitations 08/02/2018   Multiple wounds of skin 08/02/2018   CKD (chronic kidney disease), stage III ([place]) 04/27/2018   Problems influencing health status 12/27/2017   Osteoarthritis of AC (acromioclavicular) joint (Right) 12/27/2017   Grade 1 Anterolisthesis of L4 on L5 12/27/2017   DDD (degenerative disc disease), lumbar 12/27/2017   Lumbar spondylosis 12/27/2017   Lumbar central spinal stenosis (L4-5) 12/27/2017   Lumbar foraminal stenosis (L4-5) (Bilateral) 12/27/2017   Lumbar lateral recess stenosis (L4-5) (Bilateral) 12/27/2017   Osteoarthritis of lumbar facet joint (Bilateral) 12/27/2017   Lumbar facet syndrome (Bilateral) (L>R) 12/27/2017   Chronic low back pain (Primary Area of Pain) (Bilateral) (L>R) 12/12/2017   Chronic lower extremity pain (Secondary Area of Pain) (Left) 12/12/2017   Long term current use of opiate analgesic 12/12/2017   Chronic pain syndrome 12/12/2017   Disorder of skeletal system 12/12/2017   Other long term (current) drug therapy 12/12/2017   Other specified  health status 12/12/2017   Rash 03/31/2017   Fall 02/23/2017   Rotator cuff impingement syndrome (Right) 09/26/2016   Epidermal cyst 03/02/2016   Hand pain 03/02/2016   Hyponatremia 03/02/2016   Hypercholesterolemia 10/21/2015   Osteopenia 07/20/2015   De Quervain's tenosynovitis (Right) 01/16/2015   Pharmacologic therapy 11/19/2013   Adjustment disorder with mixed anxiety and depressed mood 11/19/2013   Hypothyroidism 01/24/2013   Allergic rhinitis 01/21/2013   Hypertension    Coronary  artery disease 04/19/2012   Atrophic vaginitis 04/19/2012   COPD (chronic obstructive pulmonary disease) with chronic bronchitis ([place]) 04/09/2012   Tobacco abuse 04/09/2012    Past Surgical History:  Procedure Laterality Date   CORONARY ANGIOPLASTY WITH STENT PLACEMENT  2009   [facility], drug-eluting mid RCA,prox diag;   TONSILLECTOMY     TUBAL LIGATION      OB History   No obstetric history on file.      Home Medications    Prior to Admission medications   Medication Sig Start Date End Date Taking? Authorizing Provider  albuterol (VENTOLIN HFA) 108 (90 Base) MCG/ACT inhaler INHALE 2 PUFFS INTO THE LUNGS EVERY 6 HOURS AS NEEDED FOR WHEEZING OR SHORTNESS OF BREATH 01/05/21   Kozlow, Donnamarie Poag, MD  aspirin 81 MG tablet Take 81 mg by mouth daily.    [provider]  atorvastatin (LIPITOR) 80 MG tablet TAKE 1 TABLET(80 MG) BY MOUTH DAILY 07/06/21   Leone Haven, MD  BREZTRI AEROSPHERE 160-9-4.8 MCG/ACT AERO INHALE 2 PUFFS INTO THE LUNGS TWICE DAILY 07/29/21   Kozlow, Donnamarie Poag, MD  buPROPion (WELLBUTRIN XL) 150 MG 24 hr tablet TAKE 1 TABLET(150 MG) BY MOUTH DAILY 01/01/21   Burnard Hawthorne, FNP  carisoprodol (SOMA) 350 MG tablet TAKE 1 TABLET BY MOUTH TWICE DAILY 07/30/21   Leone Haven, MD  cetirizine (ZYRTEC) 10 MG tablet Take 1-2 tablets 1-2 times daily as needed. 05/05/21   Kozlow, Donnamarie Poag, MD  Cholecalciferol (VITAMIN D-3) 1000 UNITS CAPS Take 1 capsule by mouth daily.    [provider]  Coenzyme Q10 (COQ10) 400 MG CAPS Take 400 mg by mouth daily.    [provider]  escitalopram (LEXAPRO) 20 MG tablet Take 0.5 tablets (10 mg total) by mouth 2 (two) times daily. 07/21/21   Leone Haven, MD  ezetimibe (ZETIA) 10 MG tablet TAKE 1 TABLET(10 MG) BY MOUTH DAILY 08/28/20   Leone Haven, MD  fluticasone (FLONASE) 50 MCG/ACT nasal spray SHAKE LIQUID AND USE 2 SPRAYS IN EACH NOSTRIL IN THE MORNING AND AT BEDTIME 06/23/21   Byrum, Rose Fillers, MD   Garlic 123XX123 MG CAPS Take by mouth.    [provider]  levocetirizine (XYZAL) 5 MG tablet TAKE 1 TABLET(5 MG) BY MOUTH EVERY EVENING 03/10/20   McLean-Scocuzza, Nino Glow, MD  levothyroxine (SYNTHROID) 100 MCG tablet TAKE 1 TABLET(100 MCG) BY MOUTH DAILY BEFORE BREAKFAST 07/30/21   Leone Haven, MD  metoprolol tartrate (LOPRESSOR) 25 MG tablet TAKE 1 TABLET(25 MG) BY MOUTH TWICE DAILY 04/28/21   Croitoru, Mihai, MD  montelukast (SINGULAIR) 10 MG tablet TAKE 1 TABLET(10 MG) BY MOUTH AT BEDTIME 01/05/21   Kozlow, Donnamarie Poag, MD  Multiple Vitamin (MULTIVITAMIN) capsule Take 1 capsule by mouth daily.    [provider]  mupirocin ointment (BACTROBAN) 2 % Apply 1 application topically 2 (two) times daily as needed. 07/16/20   Leone Haven, MD  nitroGLYCERIN (NITROSTAT) 0.4 MG SL tablet Place 1 tablet (0.4 mg total)  under the tongue every 5 (five) minutes as needed. 10/22/19   Croitoru, Mihai, MD  omeprazole (PRILOSEC) 20 MG capsule TAKE 1 CAPSULE BY MOUTH EVERY DAY 02/10/21   Leone Haven, MD  vitamin C (ASCORBIC ACID) 250 MG tablet Take 250 mg by mouth daily.    [provider]    Family History Family History  Problem Relation Age of Onset   Colon cancer Father    Lung cancer Father        was a former smoker   Cancer Father        lung   Diabetes Father    Hypertension Mother    Hypertension Other    Diabetes Other    Allergic rhinitis Neg Hx    Angioedema Neg Hx    Asthma Neg Hx    Atopy Neg Hx    Eczema Neg Hx    Immunodeficiency Neg Hx    Urticaria Neg Hx     Social History Social History   Tobacco Use   Smoking status: Every Day    Packs/day: 1.00    Years: 53.00    Pack years: 53.00    Types: E-cigarettes, Cigarettes    Start date: 11/19/1960   Smokeless tobacco: Never   Tobacco comments:    20 cigarettes smoked daily 03/30/21 ARJ   Vaping Use   Vaping Use: Former  Substance Use Topics   Alcohol use: Never    Alcohol/week: 1.0  standard drink    Types: 1 Standard drinks or equivalent per week   Drug use: Never     Allergies   Benzocaine, Darvon [propoxyphene hcl], Monosodium glutamate, Neosporin [neomycin-bacitracin zn-polymyx], Nicoderm [nicotine], and Prednisone   Review of Systems Review of Systems   Physical Exam Triage Vital Signs ED Triage Vitals  Enc Vitals Group     BP 08/02/21 1125 (!) 146/94     Pulse Rate 08/02/21 1125 64     Resp 08/02/21 1125 18     Temp 08/02/21 1125 99 F (37.2 C)     Temp Source 08/02/21 1125 Oral     SpO2 08/02/21 1125 96 %     Weight --      Height --      Head Circumference --      Peak Flow --      Pain Score 08/02/21 1123 0     Pain Loc --      Pain Edu? --      Excl. in [place]? --    No data found.  Updated Vital Signs BP 129/88 (BP Location: Left Arm)   Pulse 64   Temp 99 F (37.2 C) (Oral)   Resp 18   SpO2 96%   Physical Exam Constitutional:      Appearance: Normal appearance. She is normal weight.  HENT:     Right Ear: Tympanic membrane normal.     Left Ear: Tympanic membrane normal.     Nose: Nose normal.     Mouth/Throat:     Mouth: Mucous membranes are moist.     Comments: Minimal erythema with clear drainage Eyes:     Extraocular Movements: Extraocular movements intact.     Pupils: Pupils are equal, round, and reactive to light.  Cardiovascular:     Rate and Rhythm: Normal rate and regular rhythm.     Pulses: Normal pulses.     Heart sounds: Normal heart sounds.  Pulmonary:     Effort: Pulmonary effort is normal.  Comments: Clear, diminished breath sounds Musculoskeletal:     Cervical back: Normal range of motion.  Lymphadenopathy:     Cervical: No cervical adenopathy.  Skin:    General: Skin is warm and dry.  Neurological:     General: No focal deficit present.     Mental Status: She is alert and oriented to person, place, and time.     UC Treatments / Results  Labs (all labs ordered are listed, but only abnormal  results are displayed) Labs Reviewed  SARS CORONAVIRUS 2 (TAT 6-24 HRS)    EKG   Radiology No results found.  Procedures Procedures (including critical care time)  Medications Ordered in UC Medications - No data to display  Initial Impression / Assessment and Plan / UC Course  I have reviewed the triage vital signs and the nursing notes.  Pertinent labs & imaging results that were available during my care of the patient were reviewed by me and considered in my medical decision making (see chart for details).    Patient presents with upper respiratory symptoms that appear to be improving but now has lost her voice.  She has had some sick contacts and helping care for her sister who has lung cancer.  Additionally she is caring for her sister's grandchild who has had a cold this past week.  Patient does have a history of seasonal allergies and elevation of pollen and Derm she reports is consistent with her previous allergy issues.  She does report taking Zyrtec daily.  Recommend continuing Zyrtec but change it to nighttime with Flonase during the day.  Saline rinse or Nettie pot to help clear out the congestion as well.  No need for a antibiotic at this point. Final Clinical Impressions(s) / UC Diagnoses   Final diagnoses:  Seasonal allergies  Chronic obstructive pulmonary disease, unspecified COPD type [facility])   Discharge Instructions   None    ED Prescriptions   None    PDMP not reviewed this encounter.   Luvenia Redden, PA-C 08/02/21 1246

## 2021-08-02 NOTE — Telephone Encounter (Signed)
See the more recent mychart message.

## 2021-08-03 ENCOUNTER — Telehealth: Payer: Self-pay

## 2021-08-03 NOTE — Telephone Encounter (Signed)
Pt is in [place]. This is her first fall in [place]. She is a mess for the last 4-5 days. She keeps losing her voice. Urgent care said it is all allergies. She is having post nasal drainage, throat is red and hoarse. Runny nose, clear. Pt has coughing that is productive on occasion but mostly dry. No body aches no chills. She has not checked her fever today. She is taking zyrtec, flonase, breztri, she has albuterol if needed. She has started on coricidin, but she stopped it as it was interfering with her lexapro.

## 2021-08-03 NOTE — Telephone Encounter (Signed)
Patient called stating she is having a bad allergy flare up. She is having runny nose, congestion, sneezing and her voice keeps getting horse. Patient is doing 2 Zyrtec pills a day (1 whole pill in the AM, half of a pill @ lunch & half a pill at night). She is also doing Flonase as her only nasal spray. She states she is no longer taking Singulair as she didn't see that it helped her. She went to the urgent care yesterday due to her symptoms with a low grade fever of 99 that she has been having for a few days now. Her covid test was negative and they sent her home with a diagnosis of just allergies. She was told to come back in a few days if she didn't improve.   [place]

## 2021-08-04 NOTE — Telephone Encounter (Signed)
Please inform Jodi Beasley that a fever indicates infection. Allergies DO NOT produce an infection. Usually infections resolve in 14-21 days although sometimes infection do not resolve and require antibiotics.   For treatment of allergies she can consider a course of immunotherapy. She has failed medical therapy for allergies.

## 2021-08-04 NOTE — Telephone Encounter (Signed)
Jodi Beasley called back to follow up on previous message as she had not heard back since 5:30 last night. I relayed Dr. Bruna Potter message to Jodi Beasley. Jodi Beasley verbalized understanding and hung up.

## 2021-08-07 ENCOUNTER — Other Ambulatory Visit: Payer: Self-pay | Admitting: Family Medicine

## 2021-09-03 ENCOUNTER — Other Ambulatory Visit: Payer: Self-pay | Admitting: Family Medicine

## 2021-09-15 ENCOUNTER — Encounter: Payer: Self-pay | Admitting: Family Medicine

## 2021-09-15 ENCOUNTER — Ambulatory Visit (INDEPENDENT_AMBULATORY_CARE_PROVIDER_SITE_OTHER): Payer: Medicare Other | Admitting: Family Medicine

## 2021-09-15 ENCOUNTER — Other Ambulatory Visit: Payer: Self-pay

## 2021-09-15 VITALS — BP 115/70 | HR 70 | Temp 98.5°F | Ht 65.0 in | Wt 158.0 lb

## 2021-09-15 DIAGNOSIS — I2510 Atherosclerotic heart disease of native coronary artery without angina pectoris: Secondary | ICD-10-CM

## 2021-09-15 DIAGNOSIS — W19XXXA Unspecified fall, initial encounter: Secondary | ICD-10-CM | POA: Diagnosis not present

## 2021-09-15 DIAGNOSIS — I499 Cardiac arrhythmia, unspecified: Secondary | ICD-10-CM

## 2021-09-15 DIAGNOSIS — F4323 Adjustment disorder with mixed anxiety and depressed mood: Secondary | ICD-10-CM | POA: Diagnosis not present

## 2021-09-15 NOTE — Assessment & Plan Note (Signed)
This seems to have been a purely mechanical fall based on her report.  She has recovered fairly well.  Does still have some back discomfort though that is also a chronic issue.  She sees a Restaurant manager, fast food for that.  She will monitor and watch very carefully where she is going walking.

## 2021-09-15 NOTE — Assessment & Plan Note (Addendum)
Improving.  She does lose her sister so has some grief related to this.  She does have support with family members outside of the state.  We will have her continue Lexapro.  If she would like grief counseling she will let us know.

## 2021-09-15 NOTE — Patient Instructions (Signed)
Nice to see you. Please continue the Lexapro and Wellbutrin. Please monitor for any palpitations or chest pain or fatigue.

## 2021-09-15 NOTE — Assessment & Plan Note (Signed)
Irregular on exam.  EKG completed today.  This reveals ventricular bigeminy which is consistent with the findings in her cardiology office.  She will monitor for any symptoms.  She will continue to see cardiology.

## 2021-09-15 NOTE — Progress Notes (Signed)
[name], MD Phone: [phone]  Jodi Beasley is a 75 y.o. female who presents today for f/u.  Depression: Patient notes some improvement in this.  She lost her sister and her sister had a difficult time in the ICU with this.  She notes the increased dose of Lexapro has been beneficial.  No SI.  She has not had any luck with any recent counselors.  Fall: Patient reports she had a fall last week when she tripped over something in the sidewalk outside of an [employer] store.  She had a bruise and abrasion to her left shoulder and glasses struck the cement though her head did not hit the cement.  She notes her back was bothering her.  She had no loss of consciousness.  No headaches or vision changes.  No current pain.  Irregular heartbeat: Noted on exam.  She saw cardiology 3 months ago and notes they noted this as well and advised her it was an extra heartbeat.  Social History   Tobacco Use  Smoking Status Every Day   Packs/day: 1.00   Years: 53.00   Pack years: 53.00   Types: E-cigarettes, Cigarettes   Start date: 11/19/1960  Smokeless Tobacco Never  Tobacco Comments   20 cigarettes smoked daily 03/30/21 ARJ     Current Outpatient Medications on File Prior to Visit  Medication Sig Dispense Refill   albuterol (VENTOLIN HFA) 108 (90 Base) MCG/ACT inhaler INHALE 2 PUFFS INTO THE LUNGS EVERY 6 HOURS AS NEEDED FOR WHEEZING OR SHORTNESS OF BREATH 51 g 0   aspirin 81 MG tablet Take 81 mg by mouth daily.     atorvastatin (LIPITOR) 80 MG tablet TAKE 1 TABLET(80 MG) BY MOUTH DAILY 90 tablet 3   BREZTRI AEROSPHERE 160-9-4.8 MCG/ACT AERO INHALE 2 PUFFS INTO THE LUNGS TWICE DAILY 10.7 g 4   buPROPion (WELLBUTRIN XL) 150 MG 24 hr tablet TAKE 1 TABLET(150 MG) BY MOUTH DAILY 90 tablet 0   carisoprodol (SOMA) 350 MG tablet TAKE 1 TABLET BY MOUTH TWICE DAILY 60 tablet 0   cetirizine (ZYRTEC) 10 MG tablet Take 1-2 tablets 1-2 times daily as needed. 120 tablet 1   Cholecalciferol (VITAMIN D-3) 1000  UNITS CAPS Take 1 capsule by mouth daily.     Coenzyme Q10 (COQ10) 400 MG CAPS Take 400 mg by mouth daily.     escitalopram (LEXAPRO) 20 MG tablet Take 0.5 tablets (10 mg total) by mouth 2 (two) times daily. 90 tablet 1   ezetimibe (ZETIA) 10 MG tablet TAKE 1 TABLET(10 MG) BY MOUTH DAILY 90 tablet 3   fluticasone (FLONASE) 50 MCG/ACT nasal spray SHAKE LIQUID AND USE 2 SPRAYS IN EACH NOSTRIL IN THE MORNING AND AT BEDTIME 16 g 2   Garlic 2620 MG CAPS Take by mouth.     levocetirizine (XYZAL) 5 MG tablet TAKE 1 TABLET(5 MG) BY MOUTH EVERY EVENING 90 tablet 1   levothyroxine (SYNTHROID) 100 MCG tablet TAKE 1 TABLET(100 MCG) BY MOUTH DAILY BEFORE BREAKFAST 90 tablet 1   metoprolol tartrate (LOPRESSOR) 25 MG tablet TAKE 1 TABLET(25 MG) BY MOUTH TWICE DAILY 90 tablet 3   montelukast (SINGULAIR) 10 MG tablet TAKE 1 TABLET(10 MG) BY MOUTH AT BEDTIME 90 tablet 2   Multiple Vitamin (MULTIVITAMIN) capsule Take 1 capsule by mouth daily.     mupirocin ointment (BACTROBAN) 2 % Apply 1 application topically 2 (two) times daily as needed. 22 g 0   nitroGLYCERIN (NITROSTAT) 0.4 MG SL tablet Place 1 tablet (0.4 mg total)  under the tongue every 5 (five) minutes as needed. 25 tablet 3   omeprazole (PRILOSEC) 20 MG capsule TAKE 1 CAPSULE BY MOUTH EVERY DAY 90 capsule 1   vitamin C (ASCORBIC ACID) 250 MG tablet Take 250 mg by mouth daily.     No current facility-administered medications on file prior to visit.     ROS see history of present illness  Objective  Physical Exam Vitals:   09/15/21 1103  BP: 115/70  Pulse: 70  Temp: 98.5 F (36.9 C)  SpO2: 98%    BP Readings from Last 3 Encounters:  09/15/21 115/70  08/02/21 129/88  07/21/21 110/60   Wt Readings from Last 3 Encounters:  09/15/21 158 lb (71.7 kg)  07/21/21 160 lb (72.6 kg)  06/09/21 161 lb 9.6 oz (73.3 kg)    Physical Exam Constitutional:      General: She is not in acute distress.    Appearance: She is not diaphoretic.   Cardiovascular:     Rate and Rhythm: Normal rate. Rhythm irregular.     Heart sounds: Normal heart sounds.  Pulmonary:     Effort: Pulmonary effort is normal.     Breath sounds: Normal breath sounds.  Skin:    General: Skin is warm and dry.  Neurological:     Mental Status: She is alert.   EKG: Sinus rhythm with frequent PVCs, ventricular bigeminy, rate 70, no apparent ischemic changes  Assessment/Plan: Please see individual problem list.  Problem List Items Addressed This Visit     Adjustment disorder with mixed anxiety and depressed mood - Primary    Improving.  She does lose her sister so has some grief related to this.  She does have support with family members outside of the state.  We will have her continue Wellbutrin and Lexapro.  If she would like grief counseling she will let us know.      Fall    This seems to have been a purely mechanical fall based on her report.  She has recovered fairly well.  Does still have some back discomfort though that is also a chronic issue.  She sees a Restaurant manager, fast food for that.  She will monitor and watch very carefully where she is going walking.      Irregular heartbeat    Irregular on exam.  EKG completed today.  This reveals ventricular bigeminy which is consistent with the findings in her cardiology office.  She will monitor for any symptoms.  She will continue to see cardiology.      Relevant Orders   EKG 12-Lead    Return in about 3 months (around 12/16/2021) for Depression.  This visit occurred during the SARS-CoV-2 public health emergency.  Safety protocols were in place, including screening questions prior to the visit, additional usage of staff PPE, and extensive cleaning of exam room while observing appropriate contact time as indicated for disinfecting solutions.    [name], MD [place]

## 2021-09-17 ENCOUNTER — Other Ambulatory Visit: Payer: Self-pay | Admitting: Family Medicine

## 2021-09-17 DIAGNOSIS — M5442 Lumbago with sciatica, left side: Secondary | ICD-10-CM

## 2021-09-17 DIAGNOSIS — G8929 Other chronic pain: Secondary | ICD-10-CM

## 2021-09-21 ENCOUNTER — Other Ambulatory Visit: Payer: Medicare Other

## 2021-10-22 ENCOUNTER — Ambulatory Visit: Payer: Medicare Other | Admitting: Emergency Medicine

## 2021-11-01 ENCOUNTER — Other Ambulatory Visit: Payer: Self-pay | Admitting: Emergency Medicine

## 2021-11-09 ENCOUNTER — Ambulatory Visit
Admission: RE | Admit: 2021-11-09 | Discharge: 2021-11-09 | Disposition: A | Discharge status: Home / Self Care | Payer: Medicare Other | Source: Ambulatory Visit | Attending: Family Medicine | Admitting: Family Medicine

## 2021-11-09 ENCOUNTER — Other Ambulatory Visit: Payer: Self-pay

## 2021-11-09 DIAGNOSIS — Z780 Asymptomatic menopausal state: Secondary | ICD-10-CM

## 2021-11-09 DIAGNOSIS — Z1231 Encounter for screening mammogram for malignant neoplasm of breast: Secondary | ICD-10-CM | POA: Diagnosis present

## 2021-11-18 ENCOUNTER — Other Ambulatory Visit: Payer: Self-pay | Admitting: Family

## 2021-11-18 DIAGNOSIS — G8929 Other chronic pain: Secondary | ICD-10-CM

## 2021-11-18 NOTE — Telephone Encounter (Signed)
RX Refill: soma Last Seen: 09-15-21 Last Ordered: 10-14 Next Appt: 01-18-22

## 2021-12-28 ENCOUNTER — Encounter: Payer: Self-pay | Admitting: Emergency Medicine

## 2021-12-28 ENCOUNTER — Other Ambulatory Visit: Payer: Self-pay

## 2021-12-28 ENCOUNTER — Ambulatory Visit (INDEPENDENT_AMBULATORY_CARE_PROVIDER_SITE_OTHER): Payer: Medicare Other | Admitting: Emergency Medicine

## 2021-12-28 DIAGNOSIS — J309 Allergic rhinitis, unspecified: Secondary | ICD-10-CM

## 2021-12-28 DIAGNOSIS — J449 Chronic obstructive pulmonary disease, unspecified: Secondary | ICD-10-CM

## 2021-12-28 NOTE — Progress Notes (Signed)
Subjective:    Patient ID: Jodi Beasley, female    DOB: May 19, 1946, 76 y.o.   MRN: [id number]  HPI  ROV 05/28/20 --this is a follow-up visit for patient with a history of COPD, allergic rhinitis.  She has severe obstruction by pulmonary function testing and hyperinflation on chest x-ray.  Currently managed on Breo.  She has albuterol which she uses approximately once a week, often at night. She believes that her breathing is doing well. She is sometimes limited with vacuuming or mowing the lawn.  Has to stop to rest.  No burdensome cough or wheezing.  No exacerbations She is currently smoking 15 cig a day, had gotten down to 13 a day.  She has seen Dr Neldon Mc - is allergic to molds. She is benefiting from starting zyrtec, singulair, dymista  She has had the COVID vaccine.   ROV 03/30/21 --76 year old woman with a history of active tobacco use (56 pack years), hypertension, CAD, depression, GERD, hypothyroidism, chronic renal insufficiency.  We follow her for severe COPD, sees Dr. Neldon Mc for chronic rhinitis and allergies.  She was on Xyzal, Singulair but not currently.  She is on Zyrtec and flonase, is off dymista. At her last visit I tried changing her Breo to [place].  She reports that her breathing has significantly benefited. Minimal albuterol use. Her worst problem right now is her allergic drainage, congestion, cough.  She moved to [facility] 11/2020 and has had significant increase in her allergy sx as above. She was started on prednisone 10 mg daily x 10 days by Dr Neldon Mc.  She is smoking about 20 cig a day (increased). She is not in a position to consider a quit date, dealing with illness in her sister.   ROV 12/28/21 --Jodi Beasley is 43 with history of tobacco use and associated severe COPD.  Also with hypertension, CAD, GERD, chronic renal insufficiency.  She sees Dr. Neldon Mc for chronic rhinitis and allergies.  Currently managed on Breztri.  She is still smoking approximately 10 cig a day. She  reports that her walking tolerance is improved, but she still has to stop to rest after about 1 block. She walks her dogs. Uses albuterol occasionally. She coughs white mucous, feels some allergy congestion.  Remains on benadryl, off zyrtec.  Question restart Singulair - needs to discuss w Dr Neldon Mc. She may need immunotherapy.     Review of Systems As per HPI     Objective:   Physical Exam Vitals:   12/28/21 1141  BP: (!) 142/78  Pulse: 66  Temp: 98.7 F (37.1 C)  TempSrc: Oral  SpO2: 98%  Weight: 150 lb 3.2 oz (68.1 kg)  Height: 5\' 6"  (1.676 m)   Gen: Pleasant, well-nourished, in no distress,  normal affect  ENT: No lesions,  mouth clear,  oropharynx clear, no postnasal drip, strong voice  Neck: No JVD, no stridor  Lungs: No use of accessory muscles, no crackles or wheezing on normal respiration, mild low pitched wheeze on forced expiration  Cardiovascular: RRR, heart sounds normal, no murmur or gallops, no peripheral edema  Musculoskeletal: No deformities, no cyanosis or clubbing  Neuro: alert, awake, non focal  Skin: Warm, no lesions or rash      Assessment & Plan:  COPD (chronic obstructive pulmonary disease) with chronic bronchitis (HCC) Overall stable to improved.  Still has exertional symptoms and cough, mucus clearance issues.  Please continue Breztri 2 puffs twice a day.  Rinse and gargle after using. Keep your albuterol available to  use 2 puffs when needed for shortness of breath, chest tightness, wheezing. Follow with Dr Lamonte Sakai in 6 months or sooner if you have any problems  Allergic rhinitis Chronic rhinitis, possibly worse since millimolar exposure and dust exposure interim.  She is considering immunotherapy with Dr. Neldon Mc.  She did not restart Singulair but may ultimately do so.  Currently off Zyrtec due to mouth dryness, using Benadryl.   Baltazar Apo, MD, PhD 12/28/2021, 12:07 PM [facility] [phone] or if no answer  [phone]

## 2021-12-28 NOTE — Assessment & Plan Note (Signed)
Chronic rhinitis, possibly worse since millimolar exposure and dust exposure interim.  She is considering immunotherapy with Dr. Neldon Mc.  She did not restart Singulair but may ultimately do so.  Currently off Zyrtec due to mouth dryness, using Benadryl.

## 2021-12-28 NOTE — Patient Instructions (Addendum)
Please continue Breztri 2 puffs twice a day.  Rinse and gargle after using. Keep your albuterol available to use 2 puffs when needed for shortness of breath, chest tightness, wheezing. Continue to work on decreasing your smoking. Follow Dr. Neldon Mc as planned  Follow with Dr Lamonte Sakai in 6 months or sooner if you have any problems

## 2021-12-28 NOTE — Assessment & Plan Note (Signed)
Overall stable to improved.  Still has exertional symptoms and cough, mucus clearance issues.  Please continue Breztri 2 puffs twice a day.  Rinse and gargle after using. Keep your albuterol available to use 2 puffs when needed for shortness of breath, chest tightness, wheezing. Follow with Dr Lamonte Sakai in 6 months or sooner if you have any problems

## 2022-01-11 ENCOUNTER — Other Ambulatory Visit: Payer: Self-pay | Admitting: Family Medicine

## 2022-01-11 DIAGNOSIS — G8929 Other chronic pain: Secondary | ICD-10-CM

## 2022-01-11 DIAGNOSIS — M5441 Lumbago with sciatica, right side: Secondary | ICD-10-CM

## 2022-01-11 NOTE — Telephone Encounter (Signed)
Refilled: 11/19/2021 Last OV: 09/15/2021 Next OV: 01/18/2022

## 2022-01-18 ENCOUNTER — Other Ambulatory Visit: Payer: Self-pay

## 2022-01-18 ENCOUNTER — Encounter: Payer: Self-pay | Admitting: Family Medicine

## 2022-01-18 ENCOUNTER — Ambulatory Visit (INDEPENDENT_AMBULATORY_CARE_PROVIDER_SITE_OTHER): Payer: Medicare Other | Admitting: Family Medicine

## 2022-01-18 DIAGNOSIS — F4323 Adjustment disorder with mixed anxiety and depressed mood: Secondary | ICD-10-CM

## 2022-01-18 DIAGNOSIS — R7303 Prediabetes: Secondary | ICD-10-CM

## 2022-01-18 DIAGNOSIS — Z720 Tobacco use: Secondary | ICD-10-CM

## 2022-01-18 NOTE — Assessment & Plan Note (Signed)
Improving.  She is working her way through her grieving process.  She will continue Lexapro 20 mg once daily.  She asked about reducing the dose at some point in the future and I discussed that we could reduce it to 10 mg at a future date and we can discuss further at her next follow-up.

## 2022-01-18 NOTE — Assessment & Plan Note (Signed)
I recommended smoking cessation.  The patient declines smoking cessation at this time.  She was encouraged to let us know if she changes her mind.  I discussed lung cancer screening with CT imaging and she declines that as well.  She saw what her father and sister went through with treatment for cancer and she notes she is not going to go through any treatment if she does develop cancer.

## 2022-01-18 NOTE — Patient Instructions (Signed)
Nice to see you. Please try to work on reducing sugar intake. Please continue with your good exercise.

## 2022-01-18 NOTE — Assessment & Plan Note (Signed)
The patient defers lab work at this time.  She would like to check labs at her next visit.  Encouraged continued exercise.  Discussed reducing sugary foods.  Discussed finding healthier alternatives for snacking.

## 2022-01-18 NOTE — Progress Notes (Signed)
Jodi Rumps, MD Phone: [phone]  Jodi Beasley is a 76 y.o. female who presents today for follow-up.  Depression/anxiety: Patient reports her depression is better.  She is progressing through her grieving process.  She has anxiety at times and notes the Lexapro is helpful.  No SI.  Nicotine dependence, tobacco: The patient smokes 1 pack/day.  She is not ready to quit smoking.  She notes she will quit smoking at any point.  Prediabetes patient exercises by walking her dogs for 40 minutes a day.  She has been eating a lot more sugar and eating out more frequently recently.  She does not like to cook and does not have access to a great kitchen.  Social History   Tobacco Use  Smoking Status Every Day   Packs/day: 1.00   Years: 53.00   Pack years: 53.00   Types: E-cigarettes, Cigarettes   Start date: 11/19/1960  Smokeless Tobacco Never  Tobacco Comments   20 cigarettes smoked daily ARJ 12/28/21    Current Outpatient Medications on File Prior to Visit  Medication Sig Dispense Refill   albuterol (VENTOLIN HFA) 108 (90 Base) MCG/ACT inhaler INHALE 2 PUFFS INTO THE LUNGS EVERY 6 HOURS AS NEEDED FOR WHEEZING OR SHORTNESS OF BREATH 51 g 0   aspirin 81 MG tablet Take 81 mg by mouth daily.     atorvastatin (LIPITOR) 80 MG tablet TAKE 1 TABLET(80 MG) BY MOUTH DAILY 90 tablet 3   BREZTRI AEROSPHERE 160-9-4.8 MCG/ACT AERO INHALE 2 PUFFS INTO THE LUNGS TWICE DAILY 10.7 g 4   carisoprodol (SOMA) 350 MG tablet TAKE 1 TABLET BY MOUTH TWICE DAILY 60 tablet 0   cetirizine (ZYRTEC) 10 MG tablet Take 1-2 tablets 1-2 times daily as needed. 120 tablet 1   Cholecalciferol (VITAMIN D-3) 1000 UNITS CAPS Take 1 capsule by mouth daily.     Coenzyme Q10 (COQ10) 400 MG CAPS Take 400 mg by mouth daily.     escitalopram (LEXAPRO) 20 MG tablet Take 0.5 tablets (10 mg total) by mouth 2 (two) times daily. 90 tablet 1   ezetimibe (ZETIA) 10 MG tablet TAKE 1 TABLET(10 MG) BY MOUTH DAILY 90 tablet 3    fluticasone (FLONASE) 50 MCG/ACT nasal spray SHAKE LIQUID AND USE 2 SPRAYS IN EACH NOSTRIL IN THE MORNING AND AT BEDTIME 16 g 2   Garlic 4818 MG CAPS Take by mouth.     levocetirizine (XYZAL) 5 MG tablet TAKE 1 TABLET(5 MG) BY MOUTH EVERY EVENING 90 tablet 1   levothyroxine (SYNTHROID) 100 MCG tablet TAKE 1 TABLET(100 MCG) BY MOUTH DAILY BEFORE BREAKFAST 90 tablet 1   metoprolol tartrate (LOPRESSOR) 25 MG tablet TAKE 1 TABLET(25 MG) BY MOUTH TWICE DAILY 90 tablet 3   Multiple Vitamin (MULTIVITAMIN) capsule Take 1 capsule by mouth daily.     mupirocin ointment (BACTROBAN) 2 % Apply 1 application topically 2 (two) times daily as needed. 22 g 0   nitroGLYCERIN (NITROSTAT) 0.4 MG SL tablet Place 1 tablet (0.4 mg total) under the tongue every 5 (five) minutes as needed. 25 tablet 3   omeprazole (PRILOSEC) 20 MG capsule TAKE 1 CAPSULE BY MOUTH EVERY DAY 90 capsule 1   vitamin C (ASCORBIC ACID) 250 MG tablet Take 250 mg by mouth daily.     montelukast (SINGULAIR) 10 MG tablet TAKE 1 TABLET(10 MG) BY MOUTH AT BEDTIME (Patient not taking: Reported on 01/18/2022) 90 tablet 2   No current facility-administered medications on file prior to visit.     ROS see  history of present illness  Objective  Physical Exam Vitals:   01/18/22 1108  BP: 140/80  Pulse: 66  Temp: 98.3 F (36.8 C)  SpO2: 98%    BP Readings from Last 3 Encounters:  01/18/22 140/80  12/28/21 (!) 142/78  09/15/21 115/70   Wt Readings from Last 3 Encounters:  01/18/22 152 lb 6.4 oz (69.1 kg)  12/28/21 150 lb 3.2 oz (68.1 kg)  09/15/21 158 lb (71.7 kg)    Physical Exam Constitutional:      General: She is not in acute distress.    Appearance: She is not diaphoretic.  Cardiovascular:     Rate and Rhythm: Normal rate and regular rhythm.     Heart sounds: Normal heart sounds.  Pulmonary:     Effort: Pulmonary effort is normal.     Breath sounds: Normal breath sounds.  Musculoskeletal:     Right lower leg: No edema.      Left lower leg: No edema.  Skin:    General: Skin is warm and dry.  Neurological:     Mental Status: She is alert.     Assessment/Plan: Please see individual problem list.  Problem List Items Addressed This Visit     Adjustment disorder with mixed anxiety and depressed mood    Improving.  She is working her way through her grieving process.  She will continue Lexapro 20 mg once daily.  She asked about reducing the dose at some point in the future and I discussed that we could reduce it to 10 mg at a future date and we can discuss further at her next follow-up.      Prediabetes    The patient defers lab work at this time.  She would like to check labs at her next visit.  Encouraged continued exercise.  Discussed reducing sugary foods.  Discussed finding healthier alternatives for snacking.      Tobacco abuse    I recommended smoking cessation.  The patient declines smoking cessation at this time.  She was encouraged to let us know if she changes her mind.  I discussed lung cancer screening with CT imaging and she declines that as well.  She saw what her father and sister went through with treatment for cancer and she notes she is not going to go through any treatment if she does develop cancer.        Return in about 3 months (around 04/17/2022) for Hyperlipidemia/hypertension with labs.  This visit occurred during the SARS-CoV-2 public health emergency.  Safety protocols were in place, including screening questions prior to the visit, additional usage of staff PPE, and extensive cleaning of exam room while observing appropriate contact time as indicated for disinfecting solutions.    Jodi Rumps, MD [place]

## 2022-01-23 ENCOUNTER — Other Ambulatory Visit: Payer: Self-pay | Admitting: Family Medicine

## 2022-01-29 ENCOUNTER — Other Ambulatory Visit: Payer: Self-pay | Admitting: Family Medicine

## 2022-01-29 DIAGNOSIS — F4323 Adjustment disorder with mixed anxiety and depressed mood: Secondary | ICD-10-CM

## 2022-02-23 ENCOUNTER — Other Ambulatory Visit: Payer: Self-pay | Admitting: Allergy and Immunology

## 2022-02-25 ENCOUNTER — Other Ambulatory Visit: Payer: Self-pay | Admitting: Family Medicine

## 2022-02-25 DIAGNOSIS — G8929 Other chronic pain: Secondary | ICD-10-CM

## 2022-04-05 ENCOUNTER — Ambulatory Visit: Payer: Medicare Other | Admitting: Allergy and Immunology

## 2022-04-06 ENCOUNTER — Telehealth: Payer: Self-pay | Admitting: Family Medicine

## 2022-04-06 NOTE — Telephone Encounter (Signed)
Spoke with patient she req a CB to sched AWV ?

## 2022-04-12 ENCOUNTER — Other Ambulatory Visit: Payer: Self-pay | Admitting: Emergency Medicine

## 2022-04-12 ENCOUNTER — Telehealth: Payer: Self-pay | Admitting: Family Medicine

## 2022-04-12 NOTE — Telephone Encounter (Signed)
Copied from [place] [id number]. Topic: Medicare AWV ?>> Apr 12, 2022 11:09 AM Harris-Coley, Hannah Beat wrote: ?Reason for CRM: Left message for patient to schedule Annual Wellness Visit.  Please schedule with Nurse Health Advisor Denisa O'Brien-Blaney, LPN at [facility].  Please call [phone] ask for Juliann Pulse ?

## 2022-04-13 ENCOUNTER — Other Ambulatory Visit: Payer: Self-pay | Admitting: Family Medicine

## 2022-04-13 DIAGNOSIS — G8929 Other chronic pain: Secondary | ICD-10-CM

## 2022-04-14 ENCOUNTER — Ambulatory Visit (INDEPENDENT_AMBULATORY_CARE_PROVIDER_SITE_OTHER): Payer: Medicare Other

## 2022-04-14 VITALS — Ht 66.0 in | Wt 152.0 lb

## 2022-04-14 DIAGNOSIS — Z0000 Encounter for general adult medical examination without abnormal findings: Secondary | ICD-10-CM | POA: Diagnosis not present

## 2022-04-14 NOTE — Progress Notes (Signed)
Subjective:   Jodi Beasley is a 76 y.o. female who presents for Medicare Annual (Subsequent) preventive examination.  Review of Systems    No ROS.  Medicare Wellness Virtual Visit.  Visual/audio telehealth visit, UTA vital signs.   See social history for additional risk factors.   Cardiac Risk Factors include: advanced age (>46men, >62 women)     Objective:    Today's Vitals   04/14/22 1417  Weight: 152 lb (68.9 kg)  Height: 5\' 6"  (1.676 m)   Body mass index is 24.53 kg/m.     04/14/2022    2:36 PM 04/12/2021    2:15 PM 02/21/2020   10:38 AM 02/19/2019   11:16 AM 12/29/2018   11:19 AM 05/18/2018    5:36 PM 04/16/2018    3:00 PM  Advanced Directives  Does Patient Have a Medical Advance Directive? Yes Yes Yes Yes Yes Yes Yes  Type of Estate agent of [place];Living will Healthcare Power of [place];Living will Healthcare Power of [place];Living will Healthcare Power of [place];Living will Healthcare Power of [place];Living will Healthcare Power of [place];Living will Living will;Healthcare Power of Attorney  Does patient want to make changes to medical advance directive? No - Patient declined No - Patient declined No - Patient declined No - Patient declined   No - Patient declined  Copy of Healthcare Power of Attorney in Chart? No - copy requested No - copy requested No - copy requested No - copy requested   No - copy requested    Current Medications (verified) Outpatient Encounter Medications as of 04/14/2022  Medication Sig   albuterol (VENTOLIN HFA) 108 (90 Base) MCG/ACT inhaler INHALE 2 PUFFS INTO THE LUNGS EVERY 6 HOURS AS NEEDED FOR WHEEZING OR SHORTNESS OF BREATH   aspirin 81 MG tablet Take 81 mg by mouth daily.   atorvastatin (LIPITOR) 80 MG tablet TAKE 1 TABLET(80 MG) BY MOUTH DAILY   BREZTRI AEROSPHERE 160-9-4.8 MCG/ACT AERO INHALE 2 PUFFS INTO THE LUNGS TWICE DAILY   carisoprodol (SOMA) 350 MG tablet TAKE 1 TABLET BY MOUTH TWICE DAILY    cetirizine (ZYRTEC) 10 MG tablet Take 1-2 tablets 1-2 times daily as needed.   Cholecalciferol (VITAMIN D-3) 1000 UNITS CAPS Take 1 capsule by mouth daily.   Coenzyme Q10 (COQ10) 400 MG CAPS Take 400 mg by mouth daily.   escitalopram (LEXAPRO) 20 MG tablet TAKE 1/2 TABLET(10 MG) BY MOUTH TWICE DAILY   ezetimibe (ZETIA) 10 MG tablet TAKE 1 TABLET(10 MG) BY MOUTH DAILY   fluticasone (FLONASE) 50 MCG/ACT nasal spray SHAKE LIQUID AND USE 2 SPRAYS IN EACH NOSTRIL IN THE MORNING AND AT BEDTIME   Garlic 1000 MG CAPS Take by mouth.   levocetirizine (XYZAL) 5 MG tablet TAKE 1 TABLET(5 MG) BY MOUTH EVERY EVENING   levothyroxine (SYNTHROID) 100 MCG tablet TAKE 1 TABLET(100 MCG) BY MOUTH DAILY BEFORE BREAKFAST   metoprolol tartrate (LOPRESSOR) 25 MG tablet TAKE 1 TABLET(25 MG) BY MOUTH TWICE DAILY   montelukast (SINGULAIR) 10 MG tablet TAKE 1 TABLET(10 MG) BY MOUTH AT BEDTIME (Patient not taking: Reported on 01/18/2022)   Multiple Vitamin (MULTIVITAMIN) capsule Take 1 capsule by mouth daily.   mupirocin ointment (BACTROBAN) 2 % Apply 1 application topically 2 (two) times daily as needed.   nitroGLYCERIN (NITROSTAT) 0.4 MG SL tablet Place 1 tablet (0.4 mg total) under the tongue every 5 (five) minutes as needed.   omeprazole (PRILOSEC) 20 MG capsule TAKE 1 CAPSULE BY MOUTH EVERY DAY   vitamin C (ASCORBIC  ACID) 250 MG tablet Take 250 mg by mouth daily.   No facility-administered encounter medications on file as of 04/14/2022.    Allergies (verified) Benzocaine, Darvon [propoxyphene hcl], Monosodium glutamate, Neosporin [neomycin-bacitracin zn-polymyx], Nicoderm [nicotine], and Prednisone   History: Past Medical History:  Diagnosis Date   Arrhythmia    Chicken pox    COPD (chronic obstructive pulmonary disease) (HCC)    Coronary artery disease    NSTEMI in 09/2008. Cath : 80% RCA and 95% first diagonal. PCI and 2 DES placement  RCA and 1 DES to diagonal. Complicated by acute diagonal stent thrombosis .  Treated by thrombectomy. Most recent nuclear stress test in 2010 showed no ischemia with normal EF.    Depression    GERD (gastroesophageal reflux disease)    Hay fever    History of blood transfusion    History of hypercholesterolemia    History of syncope 2000   negative EP study   Hyperlipidemia    intolerance to statins except Crestor.    Hypertension    Hypothyroid    MI (myocardial infarction) (HCC)    Pneumonia, organism unspecified(486)    Sepsis (HCC) 04/27/2018   Past Surgical History:  Procedure Laterality Date   CORONARY ANGIOPLASTY WITH STENT PLACEMENT  2009   [facility], drug-eluting mid RCA,prox diag;   TONSILLECTOMY     TUBAL LIGATION     Family History  Problem Relation Age of Onset   Hypertension Mother    Colon cancer Father    Lung cancer Father        was a former smoker   Cancer Father        lung   Diabetes Father    Hypertension Other    Diabetes Other    Allergic rhinitis Neg Hx    Angioedema Neg Hx    Asthma Neg Hx    Atopy Neg Hx    Eczema Neg Hx    Immunodeficiency Neg Hx    Urticaria Neg Hx    Social History   Socioeconomic History   Marital status: Widowed    Spouse name: Not on file   Number of children: Not on file   Years of education: Not on file   Highest education level: Not on file  Occupational History   Not on file  Tobacco Use   Smoking status: Every Day    Packs/day: 1.00    Years: 53.00    Pack years: 53.00    Types: E-cigarettes, Cigarettes    Start date: 11/19/1960   Smokeless tobacco: Never   Tobacco comments:    20 cigarettes smoked daily ARJ 12/28/21  Vaping Use   Vaping Use: Former  Substance and Sexual Activity   Alcohol use: Never    Alcohol/week: 1.0 standard drink    Types: 1 Standard drinks or equivalent per week   Drug use: Never   Sexual activity: Yes  Other Topics Concern   Not on file  Social History Narrative   2 cats 2 dogs in home.   Recently moved from [facility].   Custody of  94month old.   Social Determinants of Health   Financial Resource Strain: Low Risk    Difficulty of Paying Living Expenses: Not hard at all  Food Insecurity: No Food Insecurity   Worried About Programme researcher, broadcasting/film/video in the Last Year: Never true   Ran Out of Food in the Last Year: Never true  Transportation Needs: No Transportation Needs   Lack of Transportation (  Medical): No   Lack of Transportation (Non-Medical): No  Physical Activity: Sufficiently Active   Days of Exercise per Week: 5 days   Minutes of Exercise per Session: 30 min  Stress: No Stress Concern Present   Feeling of Stress : Not at all  Social Connections: Unknown   Frequency of Communication with Friends and Family: More than three times a week   Frequency of Social Gatherings with Friends and Family: More than three times a week   Attends Religious Services: Not on Scientist, clinical (histocompatibility and immunogenetics) or Organizations: Not on file   Attends Banker Meetings: Not on file   Marital Status: Not on file    Tobacco Counseling Ready to quit: Not Answered Counseling given: Not Answered Tobacco comments: 20 cigarettes smoked daily ARJ 12/28/21   Clinical Intake:  Pre-visit preparation completed: Yes        Diabetes: No  How often do you need to have someone help you when you read instructions, pamphlets, or other written materials from your doctor or pharmacy?: 1 - Never   Interpreter Needed?: No    Activities of Daily Living    04/14/2022    2:23 PM  In your present state of health, do you have any difficulty performing the following activities:  Hearing? 1  Comment Hearing aids  Vision? 0  Difficulty concentrating or making decisions? 0  Walking or climbing stairs? 1  Comment Paces self  Dressing or bathing? 0  Doing errands, shopping? 0  Preparing Food and eating ? N  Using the Toilet? N  In the past six months, have you accidently leaked urine? N  Do you have problems with loss of bowel  control? N  Managing your Medications? N  Managing your Finances? N  Housekeeping or managing your Housekeeping? N    Patient Care Team: Glori Luis, MD as PCP - General (Family Medicine) Croitoru, Rachelle Hora, MD as PCP - Cardiology (Cardiology)  Indicate any recent Medical Services you may have received from other than [facility] providers in the past year (date may be approximate).     Assessment:   This is a routine wellness examination for Lariana.  Virtual Visit via Telephone Note  I connected with  [name] on 04/14/22 at  2:15 PM EDT by telephone and verified that I am speaking with the correct person using two identifiers.  Persons participating in the virtual visit: patient/Nurse Health Advisor   I discussed the limitations of performing an evaluation and management service by telehealth. We continued and completed visit with audio only. Some vital signs may be absent or patient reported.   Hearing/Vision screen Hearing Screening - Comments:: Hearing aids Vision Screening - Comments:: Followed by [facility] (Dr. Adele Schilder)  Wears corrective lenses  They have regular follow up with the ophthalmologist  Dietary issues and exercise activities discussed: Current Exercise Habits: Home exercise routine, Type of exercise: walking, Time (Minutes): 30, Frequency (Times/Week): 5, Weekly Exercise (Minutes/Week): 150, Intensity: Mild Healthy diet Good water intake   Goals Addressed               This Visit's Progress     Patient Stated     Maintain Healthy Lifestyle (pt-stated)        Healthy diet; portion control. Stay active. Stay hydrated.       Depression Screen    04/14/2022    2:27 PM 07/21/2021   11:03 AM 04/12/2021    2:14 PM 07/15/2020  11:09 AM 02/21/2020   10:39 AM 11/05/2019   11:02 AM 02/19/2019   11:18 AM  PHQ 2/9 Scores  PHQ - 2 Score 0 4 0 0 0 0   PHQ- 9 Score  12       Exception Documentation       Other- indicate reason in comment  box    Fall Risk    04/14/2022    2:20 PM 09/15/2021   11:05 AM 07/21/2021   11:03 AM 04/12/2021    2:22 PM 01/15/2021   11:10 AM  Fall Risk   Falls in the past year? 1  0 0 0  Number falls in past yr: 0 1 0 0 0  Injury with Fall?  1 0 0   Follow up Falls evaluation completed Falls evaluation completed Falls evaluation completed Falls evaluation completed Falls evaluation completed    FALL RISK PREVENTION PERTAINING TO THE HOME: Home free of loose throw rugs in walkways, pet beds, electrical cords, etc? Yes  Adequate lighting in your home to reduce risk of falls? Yes   ASSISTIVE DEVICES UTILIZED TO PREVENT FALLS: Life alert? No  Use of a cane, walker or w/c? No   TIMED UP AND GO: Was the test performed? No .   Cognitive Function: Patient is alert and oriented x3.  Enjoys playing solitaire, scrabble and other brain health stimulating activities.      02/15/2017   11:48 AM  MMSE - Mini Mental State Exam  Orientation to time 5  Orientation to Place 5  Registration 3  Attention/ Calculation 5  Recall 3  Language- name 2 objects 2  Language- repeat 1  Language- follow 3 step command 3  Language- read & follow direction 1  Write a sentence 1  Copy design 1  Total score 30        02/21/2020   10:47 AM 02/19/2019   11:21 AM 02/16/2018   12:15 PM  6CIT Screen  What Year? 0 points 0 points 0 points  What month? 0 points 0 points 0 points  What time? 0 points 0 points 0 points  Count back from 20 0 points 0 points 0 points  Months in reverse 0 points 0 points 0 points  Repeat phrase 0 points 0 points 0 points  Total Score 0 points 0 points 0 points    Immunizations Immunization History  Administered Date(s) Administered   Fluad Quad(high Dose 65+) 09/01/2021   Influenza Split 10/22/2012, 09/20/2014, 09/21/2015, 08/15/2016   Influenza Whole 10/17/2013   Influenza, High Dose Seasonal PF 08/23/2017, 08/22/2018, 08/06/2019   Influenza-Unspecified 09/05/2015,  08/06/2019, 08/05/2020   PFIZER(Purple Top)SARS-COV-2 Vaccination 01/25/2020, 02/18/2020, 09/10/2020, 08/25/2021   Pneumococcal Conjugate-13 12/06/2007   Pneumococcal Polysaccharide-23 11/19/2013   Td 05/05/2010   Zoster, Live 04/20/2011   Shingrix Completed?: No.    Education has been provided regarding the importance of this vaccine. Patient has been advised to call insurance company to determine out of pocket expense if they have not yet received this vaccine. Advised may also receive vaccine at local pharmacy or Health Dept. Verbalized acceptance and understanding.  Screening Tests Health Maintenance  Topic Date Due   COVID-19 Vaccine (5 - Booster for Pfizer series) 04/30/2022 (Originally 10/20/2021)   TETANUS/TDAP  05/05/2022 (Originally 05/05/2020)   Zoster Vaccines- Shingrix (1 of 2) 07/15/2022 (Originally 04/04/1965)   COLON CANCER SCREENING ANNUAL FOBT  10/15/2022 (Originally 01/27/2022)   COLONOSCOPY (Pts 45-75yrs Insurance coverage will need to be confirmed)  11/04/2022 (Originally 04/19/2020)  INFLUENZA VACCINE  07/05/2022   Pneumonia Vaccine 58+ Years old  Completed   DEXA SCAN  Completed   Hepatitis C Screening  Completed   HPV VACCINES  Aged Out   Health Maintenance There are no preventive care reminders to display for this patient.  Colonoscopy- deferred per patient.  Colon cancer screening FOBT- deferred per patient.   Vision Screening: Recommended annual ophthalmology exams for early detection of glaucoma and other disorders of the eye.  Dental Screening: Recommended annual dental exams for proper oral hygiene.  Community Resource Referral / Chronic Care Management: CRR required this visit?  No   CCM required this visit?  No      Plan:   Keep all routine maintenance appointments.   I have personally reviewed and noted the following in the patient's chart:   Medical and social history Use of alcohol, tobacco or illicit drugs  Current medications and  supplements including opioid prescriptions.  Functional ability and status Nutritional status Physical activity Advanced directives List of other physicians Hospitalizations, surgeries, and ER visits in previous 12 months Vitals Screenings to include cognitive, depression, and falls Referrals and appointments  In addition, I have reviewed and discussed with patient certain preventive protocols, quality metrics, and best practice recommendations. A written personalized care plan for preventive services as well as general preventive health recommendations were provided to patient.     Ashok Pall, LPN   1/61/0960

## 2022-04-14 NOTE — Patient Instructions (Addendum)
?  Jodi Beasley , ?Thank you for taking time to come for your Medicare Wellness Visit. I appreciate your ongoing commitment to your health goals. Please review the following plan we discussed and let me know if I can assist you in the future.  ? ?These are the goals we discussed: ? Goals   ? ?  ? Patient Stated  ?   Maintain Healthy Lifestyle (pt-stated)   ?   Healthy diet; portion control. ?Stay active. ?Stay hydrated. ?  ? ?  ?  ?This is a list of the screening recommended for you and due dates:  ?Health Maintenance  ?Topic Date Due  ? COVID-19 Vaccine (5 - Booster for Pfizer series) 04/30/2022*  ? Tetanus Vaccine  05/05/2022*  ? Zoster (Shingles) Vaccine (1 of 2) 07/15/2022*  ? Stool Blood Test  10/15/2022*  ? Colon Cancer Screening  11/04/2022*  ? Flu Shot  07/05/2022  ? Pneumonia Vaccine  Completed  ? DEXA scan (bone density measurement)  Completed  ? Hepatitis C Screening: USPSTF Recommendation to screen - Ages 61-79 yo.  Completed  ? HPV Vaccine  Aged Out  ?*Topic was postponed. The date shown is not the original due date.  ?  ?

## 2022-04-19 ENCOUNTER — Ambulatory Visit (INDEPENDENT_AMBULATORY_CARE_PROVIDER_SITE_OTHER): Payer: Medicare Other | Admitting: Family Medicine

## 2022-04-19 VITALS — BP 120/78 | HR 67 | Temp 98.0°F | Ht 66.0 in | Wt 151.0 lb

## 2022-04-19 DIAGNOSIS — M810 Age-related osteoporosis without current pathological fracture: Secondary | ICD-10-CM

## 2022-04-19 DIAGNOSIS — R7303 Prediabetes: Secondary | ICD-10-CM

## 2022-04-19 DIAGNOSIS — E7800 Pure hypercholesterolemia, unspecified: Secondary | ICD-10-CM | POA: Diagnosis not present

## 2022-04-19 DIAGNOSIS — I10 Essential (primary) hypertension: Secondary | ICD-10-CM

## 2022-04-19 DIAGNOSIS — J309 Allergic rhinitis, unspecified: Secondary | ICD-10-CM | POA: Diagnosis not present

## 2022-04-19 LAB — BASIC METABOLIC PANEL
BUN: 18 mg/dL (ref 6–23)
CO2: 26 mEq/L (ref 19–32)
Calcium: 9.5 mg/dL (ref 8.4–10.5)
Chloride: 98 mEq/L (ref 96–112)
Creatinine, Ser: 1.27 mg/dL — ABNORMAL HIGH (ref 0.40–1.20)
GFR: 41.5 mL/min — ABNORMAL LOW (ref >60.00)
Glucose, Bld: 81 mg/dL (ref 70–99)
Potassium: 4.8 mEq/L (ref 3.5–5.1)
Sodium: 132 mEq/L — ABNORMAL LOW (ref 135–145)

## 2022-04-19 LAB — LIPID PANEL
Cholesterol: 158 mg/dL (ref 0–200)
HDL: 62.3 mg/dL (ref >39.00)
LDL Cholesterol: 67 mg/dL (ref 0–99)
NonHDL: 95.39
Total CHOL/HDL Ratio: 3
Triglycerides: 143 mg/dL (ref 0.0–149.0)
VLDL: 28.6 mg/dL (ref 0.0–40.0)

## 2022-04-19 LAB — HEPATIC FUNCTION PANEL
ALT: 19 U/L (ref 0–35)
AST: 22 U/L (ref 0–37)
Albumin: 4.3 g/dL (ref 3.5–5.2)
Alkaline Phosphatase: 77 U/L (ref 39–117)
Bilirubin, Direct: 0.1 mg/dL (ref 0.0–0.3)
Total Bilirubin: 0.6 mg/dL (ref 0.2–1.2)
Total Protein: 7 g/dL (ref 6.0–8.3)

## 2022-04-19 LAB — VITAMIN D 25 HYDROXY (VIT D DEFICIENCY, FRACTURES): VITD: 61.97 ng/mL (ref 30.00–100.00)

## 2022-04-19 LAB — HEMOGLOBIN A1C: Hgb A1c MFr Bld: 6.1 % (ref 4.6–6.5)

## 2022-04-19 MED ORDER — MUPIROCIN 2 % EX OINT: 1.0000 "application " | TOPICAL_OINTMENT | Freq: Two times a day (BID) | CUTANEOUS | 0 refills | Status: AC | PRN

## 2022-04-19 MED ORDER — NITROGLYCERIN 0.4 MG SL SUBL: 0.4000 mg | SUBLINGUAL_TABLET | SUBLINGUAL | 3 refills | Status: DC | PRN

## 2022-04-19 NOTE — Assessment & Plan Note (Signed)
Check A1c. 

## 2022-04-19 NOTE — Assessment & Plan Note (Signed)
Adequately controlled.  She will continue metoprolol 25 mg twice daily. ?

## 2022-04-19 NOTE — Patient Instructions (Signed)
Nice to see you. ?We will get your lab work today and contact you with the results.  I am going to check with a pharmacist on the medicines with your kidney function and then I will try to discuss with your nephrologist as well. ?

## 2022-04-19 NOTE — Progress Notes (Signed)
?[name], MD ?Phone: [phone] ? ?Jodi Beasley is a 76 y.o. female who presents today for f/u. ? ?HYPERLIPIDEMIA ?Symptoms ?Chest pain on exertion:  no   Leg claudication:   no ?Medications: ?Compliance- taking lipitor, zetia Right upper quadrant pain- no  Muscle aches- no different than expected ?Lipid Panel  ?   ?Component Value Date/Time  ? CHOL 162 01/15/2021 1153  ? TRIG 181.0 (H) 01/15/2021 1153  ? HDL 53.00 01/15/2021 1153  ? CHOLHDL 3 01/15/2021 1153  ? VLDL 36.2 01/15/2021 1153  ? [place] 73 01/15/2021 1153  ? LDLDIRECT 80.0 04/19/2021 1129  ? ?HYPERTENSION ?Disease Monitoring ?Home BP Monitoring not checking Chest pain- no    Dyspnea- no change to chronic dyspnea ?Medications ?Compliance-  taking metoprolol.   Edema- no ?BMET ?   ?Component Value Date/Time  ? NA 134 (L) 01/15/2021 1153  ? NA 137 12/12/2017 1057  ? NA 133 (L) 07/26/2013 1023  ? K 4.8 01/15/2021 1153  ? K 4.1 07/26/2013 1023  ? CL 100 01/15/2021 1153  ? CL 103 07/26/2013 1023  ? CO2 27 01/15/2021 1153  ? CO2 26 07/26/2013 1023  ? GLUCOSE 84 01/15/2021 1153  ? GLUCOSE 97 07/26/2013 1023  ? BUN 22 01/15/2021 1153  ? BUN 9 12/12/2017 1057  ? BUN 11 07/26/2013 1023  ? CREATININE 1.45 (H) 01/15/2021 1153  ? CREATININE 0.83 07/26/2013 1023  ? CALCIUM 9.4 01/15/2021 1153  ? CALCIUM 9.1 07/26/2013 1023  ? GFRNONAA 10 (L) 04/21/2018 5284  ? GFRNONAA >60 07/26/2013 1023  ? GFRAA 12 (L) 04/21/2018 1324  ? GFRAA >60 07/26/2013 1023  ? ?Allergic rhinitis: Patient notes she stopped all of her allergy medicines other than Flonase and is also taking Benadryl.  She notes postnasal drip and rhinorrhea.  She was previously taking cetirizine and Singulair.  She notes this things dry her out too much. ? ?Osteoporosis: Patient recently had a bone density scan that revealed an elevated hip and total FRAX scores.  It appears she may have taken Fosamax at some point in the past. ? ?Social History  ? ?Tobacco Use  ?Smoking Status Every Day  ? Packs/day:  1.00  ? Years: 53.00  ? Pack years: 53.00  ? Types: E-cigarettes, Cigarettes  ? Start date: 11/19/1960  ?Smokeless Tobacco Never  ?Tobacco Comments  ? 20 cigarettes smoked daily [facility]J 12/28/21  ? ? ?Current Outpatient Medications on File Prior to Visit  ?Medication Sig Dispense Refill  ? albuterol (VENTOLIN HFA) 108 (90 Base) MCG/ACT inhaler INHALE 2 PUFFS INTO THE LUNGS EVERY 6 HOURS AS NEEDED FOR WHEEZING OR SHORTNESS OF BREATH 51 g 0  ? aspirin 81 MG tablet Take 81 mg by mouth daily.    ? atorvastatin (LIPITOR) 80 MG tablet TAKE 1 TABLET(80 MG) BY MOUTH DAILY 90 tablet 3  ? BREZTRI AEROSPHERE 160-9-4.8 MCG/ACT AERO INHALE 2 PUFFS INTO THE LUNGS TWICE DAILY 10.7 g 4  ? carisoprodol (SOMA) 350 MG tablet TAKE 1 TABLET BY MOUTH TWICE DAILY 60 tablet 0  ? Cholecalciferol (VITAMIN D-3) 1000 UNITS CAPS Take 1 capsule by mouth daily.    ? Coenzyme Q10 (COQ10) 400 MG CAPS Take 400 mg by mouth daily.    ? escitalopram (LEXAPRO) 20 MG tablet TAKE 1/2 TABLET(10 MG) BY MOUTH TWICE DAILY 90 tablet 1  ? ezetimibe (ZETIA) 10 MG tablet TAKE 1 TABLET(10 MG) BY MOUTH DAILY 90 tablet 3  ? fluticasone (FLONASE) 50 MCG/ACT nasal spray SHAKE LIQUID AND USE 2 SPRAYS  IN EACH NOSTRIL IN THE MORNING AND AT BEDTIME 16 g 2  ? levocetirizine (XYZAL) 5 MG tablet TAKE 1 TABLET(5 MG) BY MOUTH EVERY EVENING 90 tablet 1  ? levothyroxine (SYNTHROID) 100 MCG tablet TAKE 1 TABLET(100 MCG) BY MOUTH DAILY BEFORE BREAKFAST 90 tablet 1  ? metoprolol tartrate (LOPRESSOR) 25 MG tablet TAKE 1 TABLET(25 MG) BY MOUTH TWICE DAILY 90 tablet 3  ? Multiple Vitamin (MULTIVITAMIN) capsule Take 1 capsule by mouth daily.    ? omeprazole (PRILOSEC) 20 MG capsule TAKE 1 CAPSULE BY MOUTH EVERY DAY 90 capsule 1  ? vitamin C (ASCORBIC ACID) 250 MG tablet Take 250 mg by mouth daily.    ? cetirizine (ZYRTEC) 10 MG tablet Take 1-2 tablets 1-2 times daily as needed. (Patient not taking: Reported on 04/19/2022) 120 tablet 1  ? Garlic 5621 MG CAPS Take by mouth. (Patient not  taking: Reported on 04/19/2022)    ? montelukast (SINGULAIR) 10 MG tablet TAKE 1 TABLET(10 MG) BY MOUTH AT BEDTIME (Patient not taking: Reported on 04/19/2022) 90 tablet 2  ? ?No current facility-administered medications on file prior to visit.  ? ? ? ?ROS see history of present illness ? ?Objective ? ?Physical Exam ?Vitals:  ? 04/19/22 1053  ?BP: 120/78  ?Pulse: 67  ?Temp: 98 ?F (36.7 ?C)  ?SpO2: 97%  ? ? ?BP Readings from Last 3 Encounters:  ?04/19/22 120/78  ?01/18/22 140/80  ?12/28/21 (!) 142/78  ? ?Wt Readings from Last 3 Encounters:  ?04/19/22 151 lb (68.5 kg)  ?04/14/22 152 lb (68.9 kg)  ?01/18/22 152 lb 6.4 oz (69.1 kg)  ? ? ?Physical Exam ?Constitutional:   ?   General: She is not in acute distress. ?   Appearance: She is not diaphoretic.  ?Cardiovascular:  ?   Rate and Rhythm: Normal rate and regular rhythm.  ?   Heart sounds: Normal heart sounds.  ?Pulmonary:  ?   Effort: Pulmonary effort is normal.  ?   Breath sounds: Normal breath sounds.  ?Skin: ?   General: Skin is warm and dry.  ?Neurological:  ?   Mental Status: She is alert.  ? ? ? ?Assessment/Plan: Please see individual problem list. ? ?Problem List Items Addressed This Visit   ? ? Allergic rhinitis (Chronic)  ?  Chronic issue.  She can continue Flonase 2 sprays each nostril daily.  Discussed she could add back either Singulair or cetirizine to see if that would provide additional benefit. ? ?  ?  ? Hypercholesterolemia (Chronic)  ?  Check lipid panel.  She will continue Lipitor 80 mg once daily and Zetia 10 mg once daily. ? ?  ?  ? Relevant Medications  ? nitroGLYCERIN (NITROSTAT) 0.4 MG SL tablet  ? Other Relevant Orders  ? Hepatic function panel  ? Lipid panel  ? Hypertension (Chronic)  ?  Adequately controlled.  She will continue metoprolol 25 mg twice daily. ? ?  ?  ? Relevant Medications  ? nitroGLYCERIN (NITROSTAT) 0.4 MG SL tablet  ? Other Relevant Orders  ? Basic Metabolic Panel (BMET)  ? Prediabetes (Chronic)  ?  Check A1c. ? ?  ?  ?  Relevant Orders  ? HgB A1c  ? Osteoporosis - Primary  ?  I reviewed bone density testing with the patient.  Discussed that her FRAX score indicates treatment for osteoporosis.  I discussed options of Fosamax versus Prolia versus zoledronic acid.  Patient notes no history of esophageal ulcers or stomach ulcers.  Discussed the  risk of osteonecrosis of the jaw with these medications.  Her ideal body weight adjusted creatinine clearance is less than 35 which may rule out the use of Fosamax and zoledronic acid.  I will check with a clinical pharmacist on this and then discuss with her nephrologist prior to prescribing medication.  We will check a vitamin D level today.  Discussed appropriate vitamin D intake and calcium intake. ? ?  ?  ? Relevant Orders  ? Vitamin D (25 hydroxy)  ? Basic Metabolic Panel (BMET)  ? ? ? ?Return in about 3 months (around 07/20/2022). ? ? ?[name], MD ?[place] ? ?

## 2022-04-19 NOTE — Assessment & Plan Note (Signed)
I reviewed bone density testing with the patient.  Discussed that her FRAX score indicates treatment for osteoporosis.  I discussed options of Fosamax versus Prolia versus zoledronic acid.  Patient notes no history of esophageal ulcers or stomach ulcers.  Discussed the risk of osteonecrosis of the jaw with these medications.  Her ideal body weight adjusted creatinine clearance is less than 35 which may rule out the use of Fosamax and zoledronic acid.  I will check with a clinical pharmacist on this and then discuss with her nephrologist prior to prescribing medication.  We will check a vitamin D level today.  Discussed appropriate vitamin D intake and calcium intake. ?

## 2022-04-19 NOTE — Assessment & Plan Note (Signed)
Chronic issue.  She can continue Flonase 2 sprays each nostril daily.  Discussed she could add back either Singulair or cetirizine to see if that would provide additional benefit. ?

## 2022-04-19 NOTE — Assessment & Plan Note (Signed)
Check lipid panel.  She will continue Lipitor 80 mg once daily and Zetia 10 mg once daily. ?

## 2022-04-21 ENCOUNTER — Other Ambulatory Visit: Payer: Self-pay

## 2022-04-21 DIAGNOSIS — E871 Hypo-osmolality and hyponatremia: Secondary | ICD-10-CM

## 2022-05-03 ENCOUNTER — Other Ambulatory Visit: Payer: Medicare Other

## 2022-05-04 ENCOUNTER — Telehealth: Payer: Self-pay | Admitting: Family Medicine

## 2022-05-04 ENCOUNTER — Other Ambulatory Visit: Payer: Self-pay

## 2022-05-04 DIAGNOSIS — I10 Essential (primary) hypertension: Secondary | ICD-10-CM

## 2022-05-04 MED ORDER — METOPROLOL TARTRATE 25 MG PO TABS: ORAL_TABLET | ORAL | 3 refills | Status: DC

## 2022-05-04 NOTE — Telephone Encounter (Signed)
-----  Message from Osker Mason, [place] sent at 04/19/2022  4:37 PM EDT ----- No problem!  The studies are so old that I'm having a hard time finding which body weight they used.   Alternatively, risedronate (35 mg weekly) is labeled to be safe with eGFR down to 30, and has similar good benefits in hip and vertebral fracture prevention like alendronate dose.   Looks like kidney function was better today, so you're probably fine with alendronate? But you'd have a bit more wiggle room with risedronate. Or obviously Prolia could be an option.   Catie ----- Message ----- From: Leone Haven, MD Sent: 04/19/2022  11:26 AM EDT To: Osker Mason, RPH-CPP  Hey, sorry to bother you 2 days in a row. This patient has CKD and I calculated a creatinine clearance on her to figure out if fosamax was an option. Her cockroft-gault value was 38 though her ideal body weight value was 33. Do you happen to know which of these should be used as the creatinine clearance with medications such as fosamax? Thanks.   Randall Hiss

## 2022-05-04 NOTE — Telephone Encounter (Signed)
Please let the patient know that I heard back from the pharmacist. She noted that risendronate may be a better option for her osteoporosis as it can be used at lower kidney function levels. I think this would be a good option to try. It is still once a week. I can send it in after you talk to her.

## 2022-05-04 NOTE — Progress Notes (Signed)
See phone note

## 2022-05-05 NOTE — Telephone Encounter (Signed)
Patient returned Jodi Beasley's call.  Patient states she is going to eat and she will call back after she finishes.

## 2022-05-05 NOTE — Telephone Encounter (Signed)
LVM for patient to call back.   Jnyah Brazee,cma  

## 2022-05-05 NOTE — Telephone Encounter (Signed)
I called and informed the patient that the medication is an tablet not an injection and she understood.  Brynn Mulgrew,cma

## 2022-05-05 NOTE — Telephone Encounter (Signed)
Oh ok sorry I thought it was like prolia, I'll call and let her know.  Cambryn Charters,cma

## 2022-05-05 NOTE — Telephone Encounter (Signed)
I informed the patient  about the risedronate and she is willing to try it, she would like it sent to her pharmacy and she wants to give the injection to herself, she has a nurse visit  for teaching.  Aubry Rankin,cma

## 2022-05-06 MED ORDER — RISEDRONATE SODIUM 35 MG PO TABS: 35.0000 mg | ORAL_TABLET | ORAL | 1 refills | Status: DC

## 2022-05-06 NOTE — Telephone Encounter (Signed)
Risedronate sent to the pharmacy.

## 2022-05-06 NOTE — Addendum Note (Signed)
Addended by: Caryl Bis Jumana Paccione G on: 05/06/2022 12:57 PM   Modules accepted: Orders

## 2022-05-10 NOTE — Telephone Encounter (Signed)
Pt stated that do not have the medication at the [facility]

## 2022-05-10 NOTE — Telephone Encounter (Signed)
I called the pharmacy and the medication needs a PA or to change to generic, I called the patient and she stated to do the PA first.  I did it on covermymeds awaiting a response.  Mihcael Ledee,cma

## 2022-05-16 ENCOUNTER — Other Ambulatory Visit: Payer: Medicare Other

## 2022-05-18 NOTE — Telephone Encounter (Signed)
Pt called in stating that she received a denial letter in the mail for medication (risedronate (ACTONEL) 35 MG tablet)... Pt was wondering what is the next step... Pt requesting callback

## 2022-05-19 NOTE — Telephone Encounter (Signed)
Yes, the PA was denied I put the denial in your lab basket.  Lukas Pelcher,cma

## 2022-05-20 ENCOUNTER — Other Ambulatory Visit: Payer: Self-pay | Admitting: Family

## 2022-05-20 MED ORDER — ALENDRONATE SODIUM 70 MG PO TABS: 70.0000 mg | ORAL_TABLET | ORAL | 11 refills | Status: DC

## 2022-05-20 NOTE — Telephone Encounter (Signed)
Pt called and let her know that Fosamax was sent to pharmacy. Pt stated that she would pick up & get started once weekly.

## 2022-05-24 ENCOUNTER — Telehealth: Payer: Self-pay

## 2022-05-24 NOTE — Telephone Encounter (Signed)
A PA was done on the medication Risedronate and this was denied for non-formulary and quantity limit review, the denial was placed in the lab basket for your review.  Blessin Kanno,cma

## 2022-05-31 ENCOUNTER — Other Ambulatory Visit (INDEPENDENT_AMBULATORY_CARE_PROVIDER_SITE_OTHER): Payer: Medicare Other

## 2022-05-31 DIAGNOSIS — E871 Hypo-osmolality and hyponatremia: Secondary | ICD-10-CM

## 2022-05-31 LAB — BASIC METABOLIC PANEL
BUN: 14 mg/dL (ref 6–23)
CO2: 28 mEq/L (ref 19–32)
Calcium: 9.5 mg/dL (ref 8.4–10.5)
Chloride: 94 mEq/L — ABNORMAL LOW (ref 96–112)
Creatinine, Ser: 1.27 mg/dL — ABNORMAL HIGH (ref 0.40–1.20)
GFR: 41.47 mL/min — ABNORMAL LOW (ref >60.00)
Glucose, Bld: 91 mg/dL (ref 70–99)
Potassium: 4.4 mEq/L (ref 3.5–5.1)
Sodium: 130 mEq/L — ABNORMAL LOW (ref 135–145)

## 2022-06-01 ENCOUNTER — Telehealth: Payer: Self-pay

## 2022-06-01 NOTE — Telephone Encounter (Signed)
Fosamax was sent in by FNP.  This should be acceptable given the patient's creatinine clearance.

## 2022-06-01 NOTE — Telephone Encounter (Signed)
Patient states she is returning Jodi Beasley, CMA's call regarding her lab results.

## 2022-06-02 ENCOUNTER — Other Ambulatory Visit: Payer: Self-pay | Admitting: Family Medicine

## 2022-06-02 DIAGNOSIS — G8929 Other chronic pain: Secondary | ICD-10-CM

## 2022-06-02 NOTE — Telephone Encounter (Signed)
I spoke with the patient and informed her of lab results, see the results section.  Cherika Jessie,cma

## 2022-06-02 NOTE — Addendum Note (Signed)
Addended by: Fulton Mole D on: 06/02/2022 08:52 AM   Modules accepted: Orders

## 2022-06-06 ENCOUNTER — Other Ambulatory Visit: Payer: Self-pay | Admitting: Family Medicine

## 2022-06-27 ENCOUNTER — Other Ambulatory Visit (INDEPENDENT_AMBULATORY_CARE_PROVIDER_SITE_OTHER): Payer: Medicare Other

## 2022-06-27 DIAGNOSIS — E871 Hypo-osmolality and hyponatremia: Secondary | ICD-10-CM

## 2022-06-27 LAB — BASIC METABOLIC PANEL
BUN: 15 mg/dL (ref 6–23)
CO2: 26 mEq/L (ref 19–32)
Calcium: 9.3 mg/dL (ref 8.4–10.5)
Chloride: 94 mEq/L — ABNORMAL LOW (ref 96–112)
Creatinine, Ser: 1.21 mg/dL — ABNORMAL HIGH (ref 0.40–1.20)
GFR: 43.93 mL/min — ABNORMAL LOW (ref >60.00)
Glucose, Bld: 92 mg/dL (ref 70–99)
Potassium: 4.6 mEq/L (ref 3.5–5.1)
Sodium: 131 mEq/L — ABNORMAL LOW (ref 135–145)

## 2022-07-01 ENCOUNTER — Other Ambulatory Visit: Payer: Self-pay

## 2022-07-01 DIAGNOSIS — G8929 Other chronic pain: Secondary | ICD-10-CM

## 2022-07-01 MED ORDER — CARISOPRODOL 350 MG PO TABS: 350.0000 mg | ORAL_TABLET | Freq: Two times a day (BID) | ORAL | 0 refills | Status: DC

## 2022-07-06 ENCOUNTER — Other Ambulatory Visit: Payer: Self-pay | Admitting: Family Medicine

## 2022-07-06 DIAGNOSIS — F4323 Adjustment disorder with mixed anxiety and depressed mood: Secondary | ICD-10-CM

## 2022-07-19 ENCOUNTER — Other Ambulatory Visit: Payer: Self-pay

## 2022-07-19 DIAGNOSIS — G8929 Other chronic pain: Secondary | ICD-10-CM

## 2022-07-19 MED ORDER — CARISOPRODOL 350 MG PO TABS: 350.0000 mg | ORAL_TABLET | Freq: Two times a day (BID) | ORAL | 0 refills | Status: DC

## 2022-07-19 NOTE — Telephone Encounter (Signed)
Patient states she thought she called to request a refill for this medication last Thursday, but it's not at her pharmacy yet.  Patient states she needs a refill for her carisoprodol (SOMA) 350 MG tablet.  Patient states she has three or four tablets left.  *Patient states her preferred pharmacy is [facility] on [address] in [place].

## 2022-07-19 NOTE — Addendum Note (Signed)
Addended by: Caryl Bis Deke Tilghman G on: 07/19/2022 12:49 PM   Modules accepted: Orders

## 2022-07-19 NOTE — Telephone Encounter (Signed)
Sent to pharmacy 

## 2022-07-20 ENCOUNTER — Ambulatory Visit: Payer: Medicare Other | Admitting: Family Medicine

## 2022-08-18 ENCOUNTER — Other Ambulatory Visit: Payer: Self-pay

## 2022-08-18 ENCOUNTER — Other Ambulatory Visit: Payer: Self-pay | Admitting: Family Medicine

## 2022-09-01 ENCOUNTER — Other Ambulatory Visit: Payer: Self-pay | Admitting: Emergency Medicine

## 2022-09-01 ENCOUNTER — Other Ambulatory Visit: Payer: Self-pay | Admitting: Family Medicine

## 2022-09-01 DIAGNOSIS — G8929 Other chronic pain: Secondary | ICD-10-CM

## 2022-09-01 NOTE — Telephone Encounter (Signed)
Refilled: 07/19/2022 Last OV: 04/19/2022 Next OV: 09/26/2022

## 2022-09-09 ENCOUNTER — Ambulatory Visit
Admission: RE | Admit: 2022-09-09 | Discharge: 2022-09-09 | Disposition: A | Discharge status: Home / Self Care | Payer: Medicare Other | Source: Ambulatory Visit | Attending: Family Medicine | Admitting: Family Medicine

## 2022-09-09 VITALS — BP 160/90 | HR 70 | Temp 98.4°F | Resp 18

## 2022-09-09 DIAGNOSIS — M79622 Pain in left upper arm: Secondary | ICD-10-CM

## 2022-09-09 DIAGNOSIS — R58 Hemorrhage, not elsewhere classified: Secondary | ICD-10-CM

## 2022-09-09 DIAGNOSIS — I10 Essential (primary) hypertension: Secondary | ICD-10-CM

## 2022-09-09 NOTE — ED Provider Notes (Signed)
[facility]    CSN: [id number] Arrival date & time: 09/09/22  1300      History   Chief Complaint Chief Complaint  Patient presents with   Wound Check    Pain at site on arm where I had flu and rsv shots 2 weeks ago. - Entered by patient    HPI Jodi Beasley is a 76 y.o. female.  Patient presents with pain and bruising of her left upper arm after she received Flu and RSV vaccines in her left arm 3 weeks ago.  No open wounds or drainage.  No fever, chills, or other symptoms.  Treatment at home with Tylenol.  Her medical history includes hypertension, MI, COPD.   The history is provided by the patient and medical records.    Past Medical History:  Diagnosis Date   Arrhythmia    Chicken pox    COPD (chronic obstructive pulmonary disease) ([place])    Coronary artery disease    NSTEMI in 09/2008. Cath : 80% RCA and 95% first diagonal. PCI and 2 DES placement  RCA and 1 DES to diagonal. Complicated by acute diagonal stent thrombosis . Treated by thrombectomy. Most recent nuclear stress test in 2010 showed no ischemia with normal EF.    Depression    GERD (gastroesophageal reflux disease)    Hay fever    History of blood transfusion    History of hypercholesterolemia    History of syncope 2000   negative EP study   Hyperlipidemia    intolerance to statins except Crestor.    Hypertension    Hypothyroid    MI (myocardial infarction) ([place])    Pneumonia, organism unspecified(486)    Sepsis ([place]) 04/27/2018    Patient Active Problem List   Diagnosis Date Noted   Irregular heartbeat 09/15/2021   Acute pain of left wrist 04/19/2021   Stress 04/19/2021   Chronic shoulder pain 01/15/2021   History of COVID-19 01/15/2021   Prediabetes 01/15/2021   Dental anomaly 02/22/2019   GERD (gastroesophageal reflux disease) 11/21/2018   Multiple wounds of skin 08/02/2018   CKD (chronic kidney disease), stage III ([employer]) 04/27/2018   Problems influencing health status 12/27/2017    Osteoarthritis of AC (acromioclavicular) joint (Right) 12/27/2017   Grade 1 Anterolisthesis of L4 on L5 12/27/2017   DDD (degenerative disc disease), lumbar 12/27/2017   Lumbar spondylosis 12/27/2017   Lumbar central spinal stenosis (L4-5) 12/27/2017   Lumbar foraminal stenosis (L4-5) (Bilateral) 12/27/2017   Lumbar lateral recess stenosis (L4-5) (Bilateral) 12/27/2017   Osteoarthritis of lumbar facet joint (Bilateral) 12/27/2017   Lumbar facet syndrome (Bilateral) (L>R) 12/27/2017   Chronic low back pain (Primary Area of Pain) (Bilateral) (L>R) 12/12/2017   Chronic lower extremity pain (Secondary Area of Pain) (Left) 12/12/2017   Long term current use of opiate analgesic 12/12/2017   Chronic pain syndrome 12/12/2017   Disorder of skeletal system 12/12/2017   Rash 03/31/2017   Rotator cuff impingement syndrome (Right) 09/26/2016   Epidermal cyst 03/02/2016   Hypercholesterolemia 10/21/2015   Osteoporosis 07/20/2015   Pharmacologic therapy 11/19/2013   Adjustment disorder with mixed anxiety and depressed mood 11/19/2013   Hypothyroidism 01/24/2013   Allergic rhinitis 01/21/2013   Hypertension    Coronary artery disease 04/19/2012   Atrophic vaginitis 04/19/2012   COPD (chronic obstructive pulmonary disease) with chronic bronchitis ([place]) 04/09/2012   Tobacco abuse 04/09/2012    Past Surgical History:  Procedure Laterality Date   CORONARY ANGIOPLASTY WITH STENT PLACEMENT  2009  [facility], drug-eluting mid RCA,prox diag;   TONSILLECTOMY     TUBAL LIGATION      OB History   No obstetric history on file.      Home Medications    Prior to Admission medications   Medication Sig Start Date End Date Taking? Authorizing Provider  albuterol (VENTOLIN HFA) 108 (90 Base) MCG/ACT inhaler INHALE 2 PUFFS INTO THE LUNGS EVERY 6 HOURS AS NEEDED FOR WHEEZING OR SHORTNESS OF BREATH 01/05/21   Kozlow, Donnamarie Poag, MD  alendronate (FOSAMAX) 70 MG tablet Take 1 tablet (70 mg total) by mouth  every 7 (seven) days. Take with a full glass of water on an empty stomach. 05/20/22   Dutch Quint B, FNP  aspirin 81 MG tablet Take 81 mg by mouth daily.    [provider]  atorvastatin (LIPITOR) 80 MG tablet TAKE 1 TABLET(80 MG) BY MOUTH DAILY 06/06/22   Leone Haven, MD  BREZTRI AEROSPHERE 160-9-4.8 MCG/ACT AERO INHALE 2 PUFFS INTO THE LUNGS TWICE DAILY 02/23/22   Kozlow, Donnamarie Poag, MD  carisoprodol (SOMA) 350 MG tablet TAKE 1 TABLET(350 MG) BY MOUTH TWICE DAILY 09/02/22   Dutch Quint B, FNP  cetirizine (ZYRTEC) 10 MG tablet Take 1-2 tablets 1-2 times daily as needed. Patient not taking: Reported on 04/19/2022 05/05/21   Jiles Prows, MD  Cholecalciferol (VITAMIN D-3) 1000 UNITS CAPS Take 1 capsule by mouth daily.    [provider]  Coenzyme Q10 (COQ10) 400 MG CAPS Take 400 mg by mouth daily.    [provider]  escitalopram (LEXAPRO) 20 MG tablet TAKE 1/2 TABLET(10 MG) BY MOUTH TWICE DAILY 07/06/22   Dutch Quint B, FNP  ezetimibe (ZETIA) 10 MG tablet TAKE 1 TABLET(10 MG) BY MOUTH DAILY 08/18/22   Leone Haven, MD  fluticasone (FLONASE) 50 MCG/ACT nasal spray SHAKE LIQUID AND USE 2 SPRAYS IN EACH NOSTRIL IN THE MORNING AND AT BEDTIME 09/02/22   Byrum, Rose Fillers, MD  Garlic 5993 MG CAPS Take by mouth. Patient not taking: Reported on 04/19/2022    [provider]  levocetirizine (XYZAL) 5 MG tablet TAKE 1 TABLET(5 MG) BY MOUTH EVERY EVENING 03/10/20   McLean-Scocuzza, Nino Glow, MD  levothyroxine (SYNTHROID) 100 MCG tablet TAKE 1 TABLET(100 MCG) BY MOUTH DAILY BEFORE BREAKFAST 07/06/22   Dutch Quint B, FNP  metoprolol tartrate (LOPRESSOR) 25 MG tablet TAKE 1 TABLET(25 MG) BY MOUTH TWICE DAILY 05/04/22   Croitoru, Mihai, MD  montelukast (SINGULAIR) 10 MG tablet TAKE 1 TABLET(10 MG) BY MOUTH AT BEDTIME Patient not taking: Reported on 04/19/2022 01/05/21   Jiles Prows, MD  Multiple Vitamin (MULTIVITAMIN) capsule Take 1 capsule by mouth daily.    [provider]  mupirocin ointment (BACTROBAN) 2 % Apply 1 application. topically 2 (two) times daily as needed. 04/19/22   Leone Haven, MD  nitroGLYCERIN (NITROSTAT) 0.4 MG SL tablet Place 1 tablet (0.4 mg total) under the tongue every 5 (five) minutes as needed. 04/19/22   Leone Haven, MD  omeprazole (PRILOSEC) 20 MG capsule TAKE 1 CAPSULE BY MOUTH EVERY DAY 07/06/22   Dutch Quint B, FNP  vitamin C (ASCORBIC ACID) 250 MG tablet Take 250 mg by mouth daily.    [provider]    Family History Family History  Problem Relation Age of Onset   Hypertension Mother    Colon cancer Father    Lung cancer Father        was a former smoker  Cancer Father        lung   Diabetes Father    Hypertension Other    Diabetes Other    Allergic rhinitis Neg Hx    Angioedema Neg Hx    Asthma Neg Hx    Atopy Neg Hx    Eczema Neg Hx    Immunodeficiency Neg Hx    Urticaria Neg Hx     Social History Social History   Tobacco Use   Smoking status: Every Day    Packs/day: 1.00    Years: 53.00    Total pack years: 53.00    Types: E-cigarettes, Cigarettes    Start date: 11/19/1960   Smokeless tobacco: Never   Tobacco comments:    20 cigarettes smoked daily ARJ 12/28/21  Vaping Use   Vaping Use: Former  Substance Use Topics   Alcohol use: Never    Alcohol/week: 1.0 standard drink of alcohol    Types: 1 Standard drinks or equivalent per week   Drug use: Never     Allergies   Bacitracin-polymyx-neo-hc [bacitra-neomycin-polymyxin-hc], Benzocaine, Darvon [propoxyphene hcl], Monosodium glutamate, Neosporin [neomycin-bacitracin zn-polymyx], Nicoderm [nicotine], Prednisone, and Propoxyphene   Review of Systems Review of Systems  Constitutional:  Negative for chills and fever.  Musculoskeletal:  Positive for myalgias. Negative for arthralgias and joint swelling.  Skin:  Positive for color change. Negative for rash and wound.  Neurological:  Negative for weakness and  numbness.  All other systems reviewed and are negative.    Physical Exam Triage Vital Signs ED Triage Vitals [09/09/22 1307]  Enc Vitals Group     BP (!) 177/95     Pulse Rate 70     Resp 18     Temp 98.4 F (36.9 C)     Temp src      SpO2 95 %     Weight      Height      Head Circumference      Peak Flow      Pain Score      Pain Loc      Pain Edu?      Excl. in [place]?    No data found.  Updated Vital Signs BP (!) 160/90 (BP Location: Right Arm)   Pulse 70   Temp 98.4 F (36.9 C)   Resp 18   SpO2 95%   Visual Acuity Right Eye Distance:   Left Eye Distance:   Bilateral Distance:    Right Eye Near:   Left Eye Near:    Bilateral Near:     Physical Exam Vitals and nursing note reviewed.  Constitutional:      General: She is not in acute distress.    Appearance: She is well-developed. She is not ill-appearing.  HENT:     Mouth/Throat:     Mouth: Mucous membranes are moist.  Cardiovascular:     Rate and Rhythm: Normal rate and regular rhythm.     Heart sounds: Normal heart sounds.  Pulmonary:     Effort: Pulmonary effort is normal. No respiratory distress.     Breath sounds: Normal breath sounds.  Musculoskeletal:        General: Tenderness present. No swelling or deformity. Normal range of motion.       Arms:     Cervical back: Neck supple.  Skin:    General: Skin is warm and dry.     Capillary Refill: Capillary refill takes less than 2 seconds.     Findings: Bruising present.  No erythema or lesion.     Comments: Small area of ecchymosis on left upper arm. No open wound or drainage. No indication of abscess.   Neurological:     Mental Status: She is alert.     Sensory: No sensory deficit.     Motor: No weakness.  Psychiatric:        Mood and Affect: Mood normal.        Behavior: Behavior normal.      UC Treatments / Results  Labs (all labs ordered are listed, but only abnormal results are displayed) Labs Reviewed - No data to  display  EKG   Radiology No results found.  Procedures Procedures (including critical care time)  Medications Ordered in UC Medications - No data to display  Initial Impression / Assessment and Plan / UC Course  I have reviewed the triage vital signs and the nursing notes.  Pertinent labs & imaging results that were available during my care of the patient were reviewed by me and considered in my medical decision making (see chart for details).    Left upper arm pain and ecchymosis.   Elevated blood pressure reading with hypertension.   No indication of abscess or infection.  Discussed symptomatic treatment including Tylenol and warm compresses.  Instructed patient to follow-up with her PCP if her symptoms are not improving. Also discussed with patient that her blood pressure is elevated today and needs to be rechecked by PCP in 2 to 4 weeks.  Education provided on managing hypertension.   She agrees to plan of care.   Final Clinical Impressions(s) / UC Diagnoses   Final diagnoses:  Left upper arm pain  Ecchymosis  Elevated blood pressure reading in office with diagnosis of hypertension     Discharge Instructions      Take Tylenol as needed for discomfort.  Apply warm compresses as needed.  Follow up with your primary care provider if your symptoms are not improving.    Your blood pressure is elevated today at 177/95; recheck 160/90.  Please have this rechecked by your primary care provider in 1-2 weeks.          ED Prescriptions   None    PDMP not reviewed this encounter.   Sharion Balloon, NP 09/09/22 1343

## 2022-09-09 NOTE — Discharge Instructions (Addendum)
Take Tylenol as needed for discomfort.  Apply warm compresses as needed.  Follow up with your primary care provider if your symptoms are not improving.    Your blood pressure is elevated today at 177/95; recheck 160/90.  Please have this rechecked by your primary care provider in 1-2 weeks.

## 2022-09-09 NOTE — ED Triage Notes (Signed)
Patient presents to UC for left deltoid bruising, swelling x 2-3 weeks. States she received RSV and flu vaccines. States her right deltoid she has no bruising or swelling but left she did.

## 2022-09-26 ENCOUNTER — Ambulatory Visit (INDEPENDENT_AMBULATORY_CARE_PROVIDER_SITE_OTHER): Payer: Medicare Other | Admitting: Family Medicine

## 2022-09-26 ENCOUNTER — Encounter: Payer: Self-pay | Admitting: Family Medicine

## 2022-09-26 VITALS — BP 122/80 | HR 69 | Temp 97.8°F | Ht 66.0 in | Wt 154.8 lb

## 2022-09-26 DIAGNOSIS — T50Z95D Adverse effect of other vaccines and biological substances, subsequent encounter: Secondary | ICD-10-CM

## 2022-09-26 DIAGNOSIS — M5441 Lumbago with sciatica, right side: Secondary | ICD-10-CM

## 2022-09-26 DIAGNOSIS — T50Z95A Adverse effect of other vaccines and biological substances, initial encounter: Secondary | ICD-10-CM | POA: Insufficient documentation

## 2022-09-26 DIAGNOSIS — M5442 Lumbago with sciatica, left side: Secondary | ICD-10-CM

## 2022-09-26 DIAGNOSIS — F4323 Adjustment disorder with mixed anxiety and depressed mood: Secondary | ICD-10-CM

## 2022-09-26 DIAGNOSIS — E039 Hypothyroidism, unspecified: Secondary | ICD-10-CM | POA: Diagnosis not present

## 2022-09-26 DIAGNOSIS — G8929 Other chronic pain: Secondary | ICD-10-CM

## 2022-09-26 LAB — TSH: TSH: 0.71 u[IU]/mL (ref 0.35–5.50)

## 2022-09-26 NOTE — Assessment & Plan Note (Signed)
Chronic issue.  Generally stable.  She can continue Soma 350 mg twice daily.  She does follow with a chiropractor as well.

## 2022-09-26 NOTE — Progress Notes (Signed)
[name], MD Phone: [phone]  Jodi Beasley is a 76 y.o. female who presents today for follow-up.  Hypothyroidism: Taking Synthroid.  She does note some hair loss.  No other skin changes.  No heat or cold intolerance.  Chronic back pain: Patient notes this is well controlled.  She continues on [place].  She sees her chiropractor every 2 to 3 weeks.  She has occasional numbness in her left leg if she has her leg crossed underneath her right leg while sitting.  It resolves when she gets up.  No weakness.  Anxiety/depression/grief: Continues to have issues with this.  She is not as good as she would like to be with the grief.  She is not happy about much of anything.  She is on Lexapro 10 mg twice daily.  She notes no SI.  She notes she is essentially stable since her last visit.  She been doing some meditation.  She has not been able to find a counselor that works for her.  Vaccine local reaction: Patient notes she got the RSV vaccine and her right deltoid area became hard and red afterwards.  She was seen at urgent care and they recommended hot compresses.  She notes there is still some soreness with palpation and she has a small area with a scab.  Social History   Tobacco Use  Smoking Status Every Day   Packs/day: 1.00   Years: 53.00   Total pack years: 53.00   Types: E-cigarettes, Cigarettes   Start date: 11/19/1960  Smokeless Tobacco Never  Tobacco Comments   20 cigarettes smoked daily [facility] 12/28/21    Current Outpatient Medications on File Prior to Visit  Medication Sig Dispense Refill   albuterol (VENTOLIN HFA) 108 (90 Base) MCG/ACT inhaler INHALE 2 PUFFS INTO THE LUNGS EVERY 6 HOURS AS NEEDED FOR WHEEZING OR SHORTNESS OF BREATH 51 g 0   alendronate (FOSAMAX) 70 MG tablet Take 1 tablet (70 mg total) by mouth every 7 (seven) days. Take with a full glass of water on an empty stomach. 4 tablet 11   aspirin 81 MG tablet Take 81 mg by mouth daily.     atorvastatin (LIPITOR) 80  MG tablet TAKE 1 TABLET(80 MG) BY MOUTH DAILY 90 tablet 3   BREZTRI AEROSPHERE 160-9-4.8 MCG/ACT AERO INHALE 2 PUFFS INTO THE LUNGS TWICE DAILY 10.7 g 4   carisoprodol (SOMA) 350 MG tablet TAKE 1 TABLET(350 MG) BY MOUTH TWICE DAILY 60 tablet 3   cetirizine (ZYRTEC) 10 MG tablet Take 1-2 tablets 1-2 times daily as needed. 120 tablet 1   Cholecalciferol (VITAMIN D-3) 1000 UNITS CAPS Take 1 capsule by mouth daily.     Coenzyme Q10 (COQ10) 400 MG CAPS Take 400 mg by mouth daily.     escitalopram (LEXAPRO) 20 MG tablet TAKE 1/2 TABLET(10 MG) BY MOUTH TWICE DAILY 90 tablet 1   ezetimibe (ZETIA) 10 MG tablet TAKE 1 TABLET(10 MG) BY MOUTH DAILY 90 tablet 3   fluticasone (FLONASE) 50 MCG/ACT nasal spray SHAKE LIQUID AND USE 2 SPRAYS IN EACH NOSTRIL IN THE MORNING AND AT BEDTIME 16 g 0   Garlic 0263 MG CAPS Take by mouth.     levocetirizine (XYZAL) 5 MG tablet TAKE 1 TABLET(5 MG) BY MOUTH EVERY EVENING 90 tablet 1   levothyroxine (SYNTHROID) 100 MCG tablet TAKE 1 TABLET(100 MCG) BY MOUTH DAILY BEFORE BREAKFAST 90 tablet 1   metoprolol tartrate (LOPRESSOR) 25 MG tablet TAKE 1 TABLET(25 MG) BY MOUTH TWICE DAILY 90 tablet  3   montelukast (SINGULAIR) 10 MG tablet TAKE 1 TABLET(10 MG) BY MOUTH AT BEDTIME 90 tablet 2   Multiple Vitamin (MULTIVITAMIN) capsule Take 1 capsule by mouth daily.     mupirocin ointment (BACTROBAN) 2 % Apply 1 application. topically 2 (two) times daily as needed. 22 g 0   nitroGLYCERIN (NITROSTAT) 0.4 MG SL tablet Place 1 tablet (0.4 mg total) under the tongue every 5 (five) minutes as needed. 25 tablet 3   omeprazole (PRILOSEC) 20 MG capsule TAKE 1 CAPSULE BY MOUTH EVERY DAY 90 capsule 1   vitamin C (ASCORBIC ACID) 250 MG tablet Take 250 mg by mouth daily.     No current facility-administered medications on file prior to visit.     ROS see history of present illness  Objective  Physical Exam Vitals:   09/26/22 1136  BP: 122/80  Pulse: 69  Temp: 97.8 F (36.6 C)  SpO2: 97%     BP Readings from Last 3 Encounters:  09/26/22 122/80  09/09/22 (!) 160/90  04/19/22 120/78   Wt Readings from Last 3 Encounters:  09/26/22 154 lb 12.8 oz (70.2 kg)  04/19/22 151 lb (68.5 kg)  04/14/22 152 lb (68.9 kg)    Physical Exam Constitutional:      General: She is not in acute distress.    Appearance: She is not diaphoretic.  Cardiovascular:     Rate and Rhythm: Normal rate and regular rhythm.     Heart sounds: Normal heart sounds.  Pulmonary:     Effort: Pulmonary effort is normal.     Breath sounds: Normal breath sounds.  Musculoskeletal:     Comments: Left deltoid with mild tenderness at the distal insertion site, no palpable underlying abnormalities, there is a small scab overlying this area  Skin:    General: Skin is warm and dry.  Neurological:     Mental Status: She is alert.      Assessment/Plan: Please see individual problem list.  Problem List Items Addressed This Visit     Adjustment disorder with mixed anxiety and depressed mood (Chronic)    Patient continues to have issues with depression and grief.  I think she would benefit from seeing a therapist and she will look for one in [place] where she currently lives.  She will continue Lexapro 10 mg twice daily.      Chronic low back pain (Primary Area of Pain) (Bilateral) (L>R) (Chronic)    Chronic issue.  Generally stable.  She can continue Soma 350 mg twice daily.  She does follow with a chiropractor as well.      Hypothyroidism - Primary (Chronic)    Check TSH.  Continue Synthroid 100 mcg daily.      Relevant Orders   TSH   Vaccine reaction    Her reaction seems to have been a local site reaction related to the RSV vaccine.  May also be that she was injected slightly lower than the typical location and that is led to some more prolonged soreness.  Advised if she does not continue to improve and have resolution of her symptoms over the next 2 weeks she should let me know.        Return in  about 3 months (around 12/27/2022) for grief/depression.   [name], MD [place]

## 2022-09-26 NOTE — Assessment & Plan Note (Signed)
Patient continues to have issues with depression and grief.  I think she would benefit from seeing a therapist and she will look for one in [place] where she currently lives.  She will continue Lexapro 10 mg twice daily.

## 2022-09-26 NOTE — Assessment & Plan Note (Signed)
Check TSH.  Continue Synthroid 100 mcg daily. 

## 2022-09-26 NOTE — Patient Instructions (Signed)
Nice to see you. Please let me know if you need my help with a referral for a therapist. We will check your thyroid function today. If your left shoulder does not improve over the next 2 weeks please let me know.

## 2022-09-26 NOTE — Assessment & Plan Note (Signed)
Her reaction seems to have been a local site reaction related to the RSV vaccine.  May also be that she was injected slightly lower than the typical location and that is led to some more prolonged soreness.  Advised if she does not continue to improve and have resolution of her symptoms over the next 2 weeks she should let me know.

## 2022-10-11 ENCOUNTER — Ambulatory Visit: Payer: Medicare Other | Admitting: Emergency Medicine

## 2022-11-01 ENCOUNTER — Other Ambulatory Visit: Payer: Self-pay | Admitting: Cardiovascular Disease

## 2022-11-01 DIAGNOSIS — I10 Essential (primary) hypertension: Secondary | ICD-10-CM

## 2022-11-22 ENCOUNTER — Ambulatory Visit: Payer: Medicare Other | Admitting: Cardiovascular Disease

## 2022-12-16 ENCOUNTER — Other Ambulatory Visit: Payer: Self-pay | Admitting: Allergy and Immunology

## 2022-12-20 ENCOUNTER — Encounter: Payer: Self-pay | Admitting: Emergency Medicine

## 2022-12-20 ENCOUNTER — Ambulatory Visit (INDEPENDENT_AMBULATORY_CARE_PROVIDER_SITE_OTHER): Payer: Medicare Other | Admitting: Emergency Medicine

## 2022-12-20 VITALS — BP 130/80 | HR 65 | Temp 97.9°F | Ht 66.0 in | Wt 154.0 lb

## 2022-12-20 DIAGNOSIS — J4489 Other specified chronic obstructive pulmonary disease: Secondary | ICD-10-CM | POA: Diagnosis not present

## 2022-12-20 DIAGNOSIS — K219 Gastro-esophageal reflux disease without esophagitis: Secondary | ICD-10-CM | POA: Diagnosis not present

## 2022-12-20 DIAGNOSIS — J309 Allergic rhinitis, unspecified: Secondary | ICD-10-CM

## 2022-12-20 DIAGNOSIS — Z720 Tobacco use: Secondary | ICD-10-CM | POA: Diagnosis not present

## 2022-12-20 MED ORDER — ALBUTEROL SULFATE HFA 108 (90 BASE) MCG/ACT IN AERS: INHALATION_SPRAY | RESPIRATORY_TRACT | 2 refills | Status: DC

## 2022-12-20 MED ORDER — BREZTRI AEROSPHERE 160-9-4.8 MCG/ACT IN AERO: 2.0000 | INHALATION_SPRAY | Freq: Two times a day (BID) | RESPIRATORY_TRACT | 4 refills | Status: DC

## 2022-12-20 NOTE — Assessment & Plan Note (Signed)
Continue omeprazole as you have been taking it

## 2022-12-20 NOTE — Assessment & Plan Note (Signed)
Had been on [place], stopped it in October because she was in the donut hole and it was much more expensive.  She thought she was benefiting and would like to restart it.  I think we should.  No flares and good symptom control.  Good functional capacity.  When she is in the donut hole at the end of this year we could consider trying to supplement her with samples of Breztri.  Vaccines are up-to-date.  I did talk to her about smoking cessation.  We will restart your Breztri 2 puffs twice a day.  Rinse and gargle after using. Keep albuterol available to use 2 puffs up to every 4 hours if needed for shortness of breath, chest tightness, wheezing.  We will refill this for you today. You need to work on decreasing your cigarettes.   Flu shot, COVID-19 vaccine and RSV vaccine are all up-to-date Follow Dr. Lamonte Sakai in 1 year, sooner if you have any problems.

## 2022-12-20 NOTE — Patient Instructions (Addendum)
We will restart your Breztri 2 puffs twice a day.  Rinse and gargle after using. Keep albuterol available to use 2 puffs up to every 4 hours if needed for shortness of breath, chest tightness, wheezing.  We will refill this for you today. Keep fluticasone nasal spray available to use 2 sprays each nostril if you need for congestion and drainage Continue omeprazole as you have been taking it You need to work on decreasing your cigarettes.   Flu shot, COVID-19 vaccine and RSV vaccine are all up-to-date Follow Dr. Lamonte Sakai in 1 year, sooner if you have any problems.

## 2022-12-20 NOTE — Assessment & Plan Note (Signed)
Discussed cessation with her today.  She is thinking about it but is not ready to act.  I did encourage her to try to work to cut down.

## 2022-12-20 NOTE — Assessment & Plan Note (Signed)
On a less aggressive regimen with reasonable control. Keep fluticasone nasal spray available to use 2 sprays each nostril if you need for congestion and drainage

## 2022-12-20 NOTE — Progress Notes (Signed)
Subjective:    Patient ID: Jodi Beasley, female    DOB: 02/02/1946, 77 y.o.   MRN: [id number]  HPI  ROV 12/28/21 --Jodi Beasley is 15 with history of tobacco use and associated severe COPD.  Also with hypertension, CAD, GERD, chronic renal insufficiency.  She sees Dr. Neldon Mc for chronic rhinitis and allergies.  Currently managed on Breztri.  She is still smoking approximately 10 cig a day. She reports that her walking tolerance is improved, but she still has to stop to rest after about 1 block. She walks her dogs. Uses albuterol occasionally. She coughs white mucous, feels some allergy congestion.  Remains on benadryl, off zyrtec.  Question restart Singulair - needs to discuss w Dr Neldon Mc. She may need immunotherapy.    ROV 12/20/2022 --follow-up visit for [facility].  She is 19 with history of tobacco use and associated severe COPD.  She is still smoking 1 pack daily.  Also with chronic rhinitis and allergies, sees Dr. Neldon Mc.  PMH significant for hypertension, CAD, GERD, chronic renal insufficiency.  Has been managed on Breztri, but had to stop it 3 months ago because she was in the donut hole. Usually it is $45. She was using albuterol prn > about 2x a day.  On omeprazole, fluticasone nasal spray twice daily, she is using Ventolin approximately Today she reports that she is experiencing some improved breathing - benefits from cold air. No problems w chores, housework, shopping. No flares since last visit w me. Flu, covid and RSV all up to date.    Review of Systems As per HPI     Objective:   Physical Exam Vitals:   12/20/22 1117  BP: 130/80  Pulse: 65  Temp: 97.9 F (36.6 C)  TempSrc: Oral  SpO2: 97%  Weight: 154 lb (69.9 kg)  Height: '5\' 6"'$  (1.676 m)   Gen: Ple[place], well-nourished, in no distress,  normal affect  ENT: No lesions,  mouth clear,  oropharynx clear, no postnasal drip, strong voice  Neck: No JVD, no stridor  Lungs: No use of accessory muscles, no crackles or wheezing on  normal respiration, mild low pitched wheeze on forced expiration  Cardiovascular: RRR, heart sounds normal, no murmur or gallops, no peripheral edema  Musculoskeletal: No deformities, no cyanosis or clubbing  Neuro: alert, awake, non focal  Skin: Warm, no lesions or rash      Assessment & Plan:  COPD (chronic obstructive pulmonary disease) with chronic bronchitis (HCC) Had been on [place], stopped it in October because she was in the donut hole and it was much more expensive.  She thought she was benefiting and would like to restart it.  I think we should.  No flares and good symptom control.  Good functional capacity.  When she is in the donut hole at the end of this year we could consider trying to supplement her with samples of Breztri.  Vaccines are up-to-date.  I did talk to her about smoking cessation.  We will restart your Breztri 2 puffs twice a day.  Rinse and gargle after using. Keep albuterol available to use 2 puffs up to every 4 hours if needed for shortness of breath, chest tightness, wheezing.  We will refill this for you today. You need to work on decreasing your cigarettes.   Flu shot, COVID-19 vaccine and RSV vaccine are all up-to-date Follow Dr. Lamonte Sakai in 1 year, sooner if you have any problems.  Allergic rhinitis On a less aggressive regimen with reasonable control. Keep fluticasone nasal  spray available to use 2 sprays each nostril if you need for congestion and drainage  GERD (gastroesophageal reflux disease) Continue omeprazole as you have been taking it  Tobacco abuse Discussed cessation with her today.  She is thinking about it but is not ready to act.  I did encourage her to try to work to cut down.   Baltazar Apo, MD, PhD 12/20/2022, 12:02 PM [facility] [phone] or if no answer [phone]

## 2022-12-27 ENCOUNTER — Encounter: Payer: Self-pay | Admitting: Family Medicine

## 2022-12-27 ENCOUNTER — Ambulatory Visit (INDEPENDENT_AMBULATORY_CARE_PROVIDER_SITE_OTHER): Payer: Medicare Other | Admitting: Family Medicine

## 2022-12-27 VITALS — BP 138/80 | HR 83 | Temp 97.9°F | Ht 66.0 in | Wt 156.2 lb

## 2022-12-27 DIAGNOSIS — I10 Essential (primary) hypertension: Secondary | ICD-10-CM | POA: Diagnosis not present

## 2022-12-27 DIAGNOSIS — M5442 Lumbago with sciatica, left side: Secondary | ICD-10-CM

## 2022-12-27 DIAGNOSIS — M5441 Lumbago with sciatica, right side: Secondary | ICD-10-CM

## 2022-12-27 DIAGNOSIS — I499 Cardiac arrhythmia, unspecified: Secondary | ICD-10-CM | POA: Diagnosis not present

## 2022-12-27 DIAGNOSIS — F4323 Adjustment disorder with mixed anxiety and depressed mood: Secondary | ICD-10-CM | POA: Diagnosis not present

## 2022-12-27 DIAGNOSIS — G8929 Other chronic pain: Secondary | ICD-10-CM

## 2022-12-27 NOTE — Assessment & Plan Note (Addendum)
Chronic issue.  Much improved.  She will continue Lexapro 10 mg daily.

## 2022-12-27 NOTE — Patient Instructions (Signed)
Nice to see you. Please start checking your blood pressure at home and let me know if it is running above 140/90 consistently.

## 2022-12-27 NOTE — Progress Notes (Signed)
[name], MD Phone: [phone]  Jodi Beasley is a 77 y.o. female who presents today for follow-up.  Anxiety/depression/grief: Patient reports this has improved since getting to the holidays.  She did not end up doing therapy and does not feel as though she needs this.  She notes no SI.  She continues on Lexapro.  Chronic back pain: Patient notes this is adequately controlled at this time.  She takes her Soma mostly late in the evening.  She sees a Restaurant manager, fast food every 3 to 4 weeks.  Hypertension: Patient does not check at home.  She is on metoprolol.  PVCs: Patient notes a history of irregular heartbeat with PVCs that has been going on for some time.  Social History   Tobacco Use  Smoking Status Every Day   Packs/day: 1.50   Years: 53.00   Total pack years: 79.50   Types: E-cigarettes, Cigarettes   Start date: 11/19/1960  Smokeless Tobacco Never  Tobacco Comments   1ppd as of 12/20/22 ep    Current Outpatient Medications on File Prior to Visit  Medication Sig Dispense Refill   albuterol (VENTOLIN HFA) 108 (90 Base) MCG/ACT inhaler INHALE 2 PUFFS INTO THE LUNGS EVERY 6 HOURS AS NEEDED FOR WHEEZING OR SHORTNESS OF BREATH 51 g 2   alendronate (FOSAMAX) 70 MG tablet Take 1 tablet (70 mg total) by mouth every 7 (seven) days. Take with a full glass of water on an empty stomach. 4 tablet 11   aspirin 81 MG tablet Take 81 mg by mouth daily.     atorvastatin (LIPITOR) 80 MG tablet TAKE 1 TABLET(80 MG) BY MOUTH DAILY 90 tablet 3   Budeson-Glycopyrrol-Formoterol (BREZTRI AEROSPHERE) 160-9-4.8 MCG/ACT AERO Inhale 2 puffs into the lungs 2 (two) times daily. 10.7 g 4   carisoprodol (SOMA) 350 MG tablet TAKE 1 TABLET(350 MG) BY MOUTH TWICE DAILY 60 tablet 3   Cholecalciferol (VITAMIN D-3) 1000 UNITS CAPS Take 1 capsule by mouth daily.     Coenzyme Q10 (COQ10) 400 MG CAPS Take 400 mg by mouth daily.     escitalopram (LEXAPRO) 20 MG tablet TAKE 1/2 TABLET(10 MG) BY MOUTH TWICE DAILY 90  tablet 1   ezetimibe (ZETIA) 10 MG tablet TAKE 1 TABLET(10 MG) BY MOUTH DAILY 90 tablet 3   fluticasone (FLONASE) 50 MCG/ACT nasal spray SHAKE LIQUID AND USE 2 SPRAYS IN EACH NOSTRIL IN THE MORNING AND AT BEDTIME 16 g 0   Garlic 4193 MG CAPS Take by mouth.     levothyroxine (SYNTHROID) 100 MCG tablet TAKE 1 TABLET(100 MCG) BY MOUTH DAILY BEFORE BREAKFAST 90 tablet 1   metoprolol tartrate (LOPRESSOR) 25 MG tablet TAKE 1 TABLET(25 MG) BY MOUTH TWICE DAILY 90 tablet 3   Multiple Vitamin (MULTIVITAMIN) capsule Take 1 capsule by mouth daily.     mupirocin ointment (BACTROBAN) 2 % Apply 1 application. topically 2 (two) times daily as needed. 22 g 0   nitroGLYCERIN (NITROSTAT) 0.4 MG SL tablet Place 1 tablet (0.4 mg total) under the tongue every 5 (five) minutes as needed. 25 tablet 3   omeprazole (PRILOSEC) 20 MG capsule TAKE 1 CAPSULE BY MOUTH EVERY DAY 90 capsule 1   vitamin C (ASCORBIC ACID) 250 MG tablet Take 250 mg by mouth daily.     No current facility-administered medications on file prior to visit.     ROS see history of present illness  Objective  Physical Exam Vitals:   12/27/22 1122 12/27/22 1134  BP: (!) 140/80 138/80  Pulse: (!) 50  83  Temp: 97.9 F (36.6 C)   SpO2: 97%     BP Readings from Last 3 Encounters:  12/27/22 138/80  12/20/22 130/80  09/26/22 122/80   Wt Readings from Last 3 Encounters:  12/27/22 156 lb 3.2 oz (70.9 kg)  12/20/22 154 lb (69.9 kg)  09/26/22 154 lb 12.8 oz (70.2 kg)    Physical Exam Constitutional:      General: She is not in acute distress.    Appearance: She is not diaphoretic.  Cardiovascular:     Rate and Rhythm: Normal rate. Rhythm regularly irregular.     Heart sounds: Normal heart sounds.  Pulmonary:     Effort: Pulmonary effort is normal.     Breath sounds: Normal breath sounds.  Skin:    General: Skin is warm and dry.  Neurological:     Mental Status: She is alert.      Assessment/Plan: Please see individual problem  list.  Primary hypertension Assessment & Plan: Borderline elevated.  Discussed checking this at home consistently and if it is running above her levels today she should let us know.  She will continue metoprolol 25 mg twice daily.   Chronic low back pain (Primary Area of Pain) (Bilateral) (L>R) Assessment & Plan: Chronic issue.  Stable.  She can continue Soma 1 tablet by mouth twice daily as needed.   Adjustment disorder with mixed anxiety and depressed mood Assessment & Plan: Chronic issue.  Much improved.  She will continue Lexapro 10 mg daily.   Irregular heartbeat Assessment & Plan: Patient has a history of ventricular bigeminy.  Her current heart rate on exam is consistent with this.  She will monitor.      Health Maintenance: Patient declines colon cancer screening and lung cancer screening.  She understands the risk that we could be missing a significant underlying lesion.  Counseled patient on smoking cessation though she declines and notes she is not ready to quit smoking.  Return in about 6 months (around 06/27/2023).   [name], MD [place]

## 2022-12-27 NOTE — Assessment & Plan Note (Signed)
Patient has a history of ventricular bigeminy.  Her current heart rate on exam is consistent with this.  She will monitor.

## 2022-12-27 NOTE — Assessment & Plan Note (Signed)
Chronic issue.  Stable.  She can continue Soma 1 tablet by mouth twice daily as needed.

## 2022-12-27 NOTE — Assessment & Plan Note (Signed)
Borderline elevated.  Discussed checking this at home consistently and if it is running above her levels today she should let us know.  She will continue metoprolol 25 mg twice daily.

## 2023-01-12 ENCOUNTER — Other Ambulatory Visit: Payer: Self-pay | Admitting: Family Medicine

## 2023-01-12 DIAGNOSIS — Z1231 Encounter for screening mammogram for malignant neoplasm of breast: Secondary | ICD-10-CM

## 2023-01-27 ENCOUNTER — Telehealth: Payer: Self-pay | Admitting: Family Medicine

## 2023-01-27 ENCOUNTER — Other Ambulatory Visit: Payer: Self-pay

## 2023-01-27 MED ORDER — LEVOTHYROXINE SODIUM 100 MCG PO TABS: ORAL_TABLET | ORAL | 1 refills | Status: DC

## 2023-01-27 NOTE — Telephone Encounter (Signed)
sent 

## 2023-01-27 NOTE — Telephone Encounter (Signed)
Pt need a refill on levothyroxine sent to [facility]

## 2023-02-21 ENCOUNTER — Other Ambulatory Visit: Payer: Self-pay | Admitting: *Deleted

## 2023-02-21 DIAGNOSIS — F4323 Adjustment disorder with mixed anxiety and depressed mood: Secondary | ICD-10-CM

## 2023-02-21 MED ORDER — ESCITALOPRAM OXALATE 20 MG PO TABS: ORAL_TABLET | ORAL | 1 refills | Status: DC

## 2023-03-01 ENCOUNTER — Other Ambulatory Visit: Payer: Self-pay | Admitting: *Deleted

## 2023-03-01 MED ORDER — OMEPRAZOLE 20 MG PO CPDR: 20.0000 mg | DELAYED_RELEASE_CAPSULE | Freq: Every day | ORAL | 1 refills | Status: DC

## 2023-03-09 ENCOUNTER — Telehealth: Payer: Self-pay

## 2023-03-09 DIAGNOSIS — G8929 Other chronic pain: Secondary | ICD-10-CM

## 2023-03-09 MED ORDER — CARISOPRODOL 350 MG PO TABS: ORAL_TABLET | ORAL | 3 refills | Status: DC

## 2023-03-09 NOTE — Telephone Encounter (Signed)
Sent to pharmacy 

## 2023-03-12 ENCOUNTER — Other Ambulatory Visit: Payer: Self-pay | Admitting: Emergency Medicine

## 2023-03-21 ENCOUNTER — Ambulatory Visit: Payer: Medicare Other | Admitting: Cardiovascular Disease

## 2023-04-03 ENCOUNTER — Telehealth: Payer: Self-pay | Admitting: Family Medicine

## 2023-04-03 NOTE — Telephone Encounter (Signed)
Contacted Jodi Beasley to schedule their annual wellness visit. Appointment made for 50/20/2024.  Verlee Rossetti; Care Guide Ambulatory Clinical Support [place] [facility] Direct Dial: [phone]

## 2023-04-06 ENCOUNTER — Ambulatory Visit
Admission: RE | Admit: 2023-04-06 | Discharge: 2023-04-06 | Disposition: A | Discharge status: Home / Self Care | Payer: Medicare Other | Source: Ambulatory Visit | Attending: Family Medicine | Admitting: Family Medicine

## 2023-04-06 DIAGNOSIS — Z1231 Encounter for screening mammogram for malignant neoplasm of breast: Secondary | ICD-10-CM | POA: Diagnosis present

## 2023-04-10 DIAGNOSIS — I493 Ventricular premature depolarization: Secondary | ICD-10-CM | POA: Insufficient documentation

## 2023-04-25 ENCOUNTER — Ambulatory Visit: Payer: Medicare Other

## 2023-04-25 DIAGNOSIS — M47812 Spondylosis without myelopathy or radiculopathy, cervical region: Secondary | ICD-10-CM | POA: Insufficient documentation

## 2023-04-26 ENCOUNTER — Telehealth: Payer: Self-pay

## 2023-04-26 NOTE — Transitions of Care (Post Inpatient/ED Visit) (Signed)
I spoke with pt;  pt said starting 04/22/23 with no known injury; pt said just started hurting on both sides of neck and pain worsened and continued. pt went to [facility] ED on 04/25/23. pt said she had to wait too long and left without being seen. pt was seen at [facility]. Pt said she was told at [employer] that pt has a lot of arthritis in neck and pt thinks dx was cervical spondylitis. .PA at [facility] gave pt prednisone rx which pt is picking up today and pt is going to [facility] in [place] for 1 wk. Pt said was advised by PA when finish prednisone and PT to call [facility] for further instructions. Offered pt appt at [facility] but pt said she wanted to try taking med and doing PT first and if needs appt at [facility] pt has contact info to reach office. UC & ED precautions given and pt voiced understanding. Sending note to Dr Birdie Sons.     04/26/2023  Name: Jodi Beasley MRN: [id number] DOB: 08/28/1946  Today's TOC FU Call Status: Today's TOC FU Call Status:: Successful TOC FU Call Competed TOC FU Call Complete Date: 04/26/23  Transition Care Management Follow-up Telephone Call Date of Discharge: 04/25/23 Discharge Facility: Other ([facility]) Name of [facility] ([facility]) Discharge Facility: [facility] Type of Discharge: Emergency Department Reason for ED Visit: Other: (starting 04/22/23 with no known injury; pt said just started hurting on both sides of neck and pain worsened and continued. pt went to [facility] ED on 04/25/23. pt said she had to wait too long and left without being seen. pt was seen at [facility] How have you been since you were released from the hospital?: Better (pain is slightly better in neck with heating pad. pain level now is 6 and pt is to pick up prednisone 04/26/23.) Any questions or concerns?: No  Items Reviewed: Did you receive and understand the discharge instructions provided?: Yes Medications obtained,verified, and  reconciled?: Yes (Medications Reviewed) (pt to pick up prednisone 04/26/23.) Any new allergies since your discharge?: No Dietary orders reviewed?: NA Do you have support at home?: Yes People in Home: grandchild(ren) Name of Support/Comfort Primary Source: daughter in law and grandchildren live next door;pt has ADT button to push if needs help.  Medications Reviewed Today: Medications Reviewed Today     Reviewed by Allean Found, CMA (Certified Medical Assistant) on 12/27/22 at 1126  Med List Status: <None>   Medication Order Taking? Sig Documenting Provider Last Dose Status Informant  albuterol (VENTOLIN HFA) 108 (90 Base) MCG/ACT inhaler [id number] Yes INHALE 2 PUFFS INTO THE LUNGS EVERY 6 HOURS AS NEEDED FOR WHEEZING OR SHORTNESS OF BREATH Byrum, Les Pou, MD Taking Active   alendronate (FOSAMAX) 70 MG tablet [id number] Yes Take 1 tablet (70 mg total) by mouth every 7 (seven) days. Take with a full glass of water on an empty stomach. Worthy Rancher B, FNP Taking Active   aspirin 81 MG tablet [id number] Yes Take 81 mg by mouth daily. [provider] Taking Active Self  atorvastatin (LIPITOR) 80 MG tablet [id number] Yes TAKE 1 TABLET(80 MG) BY MOUTH DAILY Glori Luis, MD Taking Active   Budeson-Glycopyrrol-Formoterol (BREZTRI AEROSPHERE) 160-9-4.8 MCG/ACT AERO [id number] Yes Inhale 2 puffs into the lungs 2 (two) times daily. Leslye Peer, MD Taking Active   carisoprodol (SOMA) 350 MG tablet [id number] Yes TAKE 1 TABLET(350 MG) BY MOUTH TWICE DAILY Eulis Foster, FNP Taking Active  Cholecalciferol (VITAMIN D-3) 1000 UNITS CAPS [id number] Yes Take 1 capsule by mouth daily. [provider] Taking Active Self  Coenzyme Q10 (COQ10) 400 MG CAPS [id number] Yes Take 400 mg by mouth daily. [provider] Taking Active Self  escitalopram (LEXAPRO) 20 MG tablet [id number] Yes TAKE 1/2 TABLET(10 MG) BY MOUTH TWICE DAILY Worthy Rancher B, FNP Taking Active   ezetimibe (ZETIA)  10 MG tablet [id number] Yes TAKE 1 TABLET(10 MG) BY MOUTH DAILY Glori Luis, MD Taking Active   fluticasone (FLONASE) 50 MCG/ACT nasal spray [id number] Yes SHAKE LIQUID AND USE 2 SPRAYS IN EACH NOSTRIL IN THE MORNING AND AT BEDTIME Leslye Peer, MD Taking Active   Garlic 1000 MG CAPS [id number] Yes Take by mouth. [provider] Taking Active   levothyroxine (SYNTHROID) 100 MCG tablet [id number] Yes TAKE 1 TABLET(100 MCG) BY MOUTH DAILY BEFORE BREAKFAST Worthy Rancher B, FNP Taking Active   metoprolol tartrate (LOPRESSOR) 25 MG tablet [id number] Yes TAKE 1 TABLET(25 MG) BY MOUTH TWICE DAILY Croitoru, Mihai, MD Taking Active   Multiple Vitamin (MULTIVITAMIN) capsule [id number] Yes Take 1 capsule by mouth daily. [provider] Taking Active Self  mupirocin ointment (BACTROBAN) 2 % [id number] Yes Apply 1 application. topically 2 (two) times daily as needed. Glori Luis, MD Taking Active   nitroGLYCERIN (NITROSTAT) 0.4 MG SL tablet [id number] Yes Place 1 tablet (0.4 mg total) under the tongue every 5 (five) minutes as needed. Glori Luis, MD Taking Active   omeprazole (PRILOSEC) 20 MG capsule [id number] Yes TAKE 1 CAPSULE BY MOUTH EVERY DAY Worthy Rancher B, FNP Taking Active   vitamin C (ASCORBIC ACID) 250 MG tablet [id number] Yes Take 250 mg by mouth daily. [provider] Taking Active   Med List Note Newman Pies, RN 12/12/17 1051): uds 12/12/17            Home Care and Equipment/Supplies: Were Home Health Services Ordered?: NA (no HH services pt going to [facility] [place] for 1 wk PT.) Name of Home Health Agency:: none Has Agency set up a time to come to your home?: No EMR reviewed for Home Health Orders: Home Health Not Ordered Any new equipment or medical supplies ordered?: NA  Functional Questionnaire: Do you need assistance with bathing/showering or dressing?: No Do you need assistance with meal preparation?: No Do you need assistance with  eating?: No Do you have difficulty maintaining continence: No Do you need assistance with getting out of bed/getting out of a chair/moving?: No Do you have difficulty managing or taking your medications?: No  Follow up appointments reviewed: PCP Follow-up appointment confirmed?: NA (pt said she will wait and see how prednisone and PT does and if needs appt pt will call [facility] for appt. pt has contact info for [facility] Follow-up appointment confirmed?: No (pt said PA that pt saw at [facility] [place] advised to take prednisone,, have 1 wk PT and then call [facility] back for further instructions.) Reason Specialist Follow-Up Not Confirmed: Patient has Specialist Provider Number and will Call for Appointment Do you need transportation to your follow-up appointment?: No Do you understand care options if your condition(s) worsen?: Yes-patient verbalized understanding    SIGNATURE Lewanda Rife, LPN

## 2023-04-27 NOTE — Telephone Encounter (Signed)
Noted  

## 2023-05-20 ENCOUNTER — Other Ambulatory Visit: Payer: Self-pay | Admitting: Family Medicine

## 2023-05-24 ENCOUNTER — Encounter: Payer: Self-pay | Admitting: *Deleted

## 2023-05-24 ENCOUNTER — Other Ambulatory Visit: Payer: Self-pay | Admitting: *Deleted

## 2023-05-24 DIAGNOSIS — I493 Ventricular premature depolarization: Secondary | ICD-10-CM

## 2023-05-24 DIAGNOSIS — R001 Bradycardia, unspecified: Secondary | ICD-10-CM

## 2023-05-24 NOTE — Telephone Encounter (Signed)
It appears that this was ordered by a cardiologist with [facility]. Please call the patient to make sure they have contacted her with the results from the holter monitor. If they have contacted her please see if they have a plan in place for the patient. Thanks.

## 2023-05-24 NOTE — Addendum Note (Signed)
Addended by: Birdie Sons Duwayne Matters G on: 05/24/2023 01:49 PM   Modules accepted: Orders

## 2023-05-24 NOTE — Telephone Encounter (Signed)
Pt called stating she is going to a new cardiologist-Dr. Launa Flight in [place] and pt has already made the appointment

## 2023-05-25 NOTE — Telephone Encounter (Signed)
Noted. I cancelled out the cardiology referral yesterday.

## 2023-06-04 ENCOUNTER — Other Ambulatory Visit: Payer: Self-pay | Admitting: Emergency Medicine

## 2023-06-16 ENCOUNTER — Ambulatory Visit (INDEPENDENT_AMBULATORY_CARE_PROVIDER_SITE_OTHER): Payer: Medicare Other | Admitting: Family Medicine

## 2023-06-16 ENCOUNTER — Other Ambulatory Visit: Payer: Self-pay | Admitting: Family Medicine

## 2023-06-16 ENCOUNTER — Ambulatory Visit: Payer: Medicare Other | Admitting: Family Medicine

## 2023-06-16 ENCOUNTER — Encounter: Payer: Self-pay | Admitting: Family Medicine

## 2023-06-16 VITALS — BP 124/76 | HR 64 | Temp 98.4°F | Ht 66.0 in | Wt 149.4 lb

## 2023-06-16 DIAGNOSIS — F4323 Adjustment disorder with mixed anxiety and depressed mood: Secondary | ICD-10-CM | POA: Diagnosis not present

## 2023-06-16 DIAGNOSIS — M5441 Lumbago with sciatica, right side: Secondary | ICD-10-CM | POA: Diagnosis not present

## 2023-06-16 DIAGNOSIS — G8929 Other chronic pain: Secondary | ICD-10-CM

## 2023-06-16 DIAGNOSIS — M5442 Lumbago with sciatica, left side: Secondary | ICD-10-CM

## 2023-06-16 MED ORDER — BUSPIRONE HCL 7.5 MG PO TABS: 7.5000 mg | ORAL_TABLET | Freq: Two times a day (BID) | ORAL | 3 refills | Status: DC

## 2023-06-16 NOTE — Assessment & Plan Note (Signed)
Chronic issue.  Worsened recently with her family dynamics.  She will continue Lexapro 10 mg daily.  We will add buspirone 7.5 mg twice daily.  Discussed monitoring for any side effects with this.  Discussed the option for trying something like hydroxyzine if buspirone is not beneficial.  Discussed it may take a number of weeks before buspirone becomes beneficial.

## 2023-06-16 NOTE — Assessment & Plan Note (Signed)
Chronic issue.  Had a flare of neck pain recently.  Has returned to her baseline.  She can continue Soma 1 tablet by mouth twice daily as needed.  Discussed that the [name] is metabolized into something like a benzodiazepine and that is why it would have helped with her anxiety.  Advised she should only take the [facility] for her chronic pain.

## 2023-06-16 NOTE — Progress Notes (Signed)
Marikay Alar, MD Phone: [phone]  Jodi Beasley is a 77 y.o. female who presents today for f/u.  Anxiety/depression: Patient notes some anxiety and some depression.  Notes its mostly related to family issues since her sister passed away.  She does take Lexapro.  She notes being shaky and anxious at times.  She has been taking half of the Soma to help her calm down.  She notes no SI.  Chronic neck and back pain: Patient takes Soma daily for this.  She is been on this for many years.  She does see a [facility].  She recently saw new chiropractor as hers retired.  They adjusted her neck while laying down and she had intense pain after the adjustment.  She went to the emergency department and left without seeing anybody given the long wait.  She ended up seeing orthopedics for this and was placed on steroids.  She notes they would not give her any pain medicine and she does not understand why they would not give it to her as she has friends that go to the [place] and get 120 pain pills per month.  She notes at this time her neck and back pain are back to their baseline.  Social History   Tobacco Use  Smoking Status Every Day   Current packs/day: 1.50   Average packs/day: 1.5 packs/day for 62.6 years (93.9 ttl pk-yrs)   Types: E-cigarettes, Cigarettes   Start date: 11/19/1960  Smokeless Tobacco Never  Tobacco Comments   1ppd as of 12/20/22 ep    Current Outpatient Medications on File Prior to Visit  Medication Sig Dispense Refill   albuterol (VENTOLIN HFA) 108 (90 Base) MCG/ACT inhaler INHALE 2 PUFFS INTO THE LUNGS EVERY 6 HOURS AS NEEDED FOR WHEEZING OR SHORTNESS OF BREATH 51 g 2   aspirin 81 MG tablet Take 81 mg by mouth daily.     atorvastatin (LIPITOR) 80 MG tablet TAKE 1 TABLET(80 MG) BY MOUTH DAILY 90 tablet 3   BREZTRI AEROSPHERE 160-9-4.8 MCG/ACT AERO INHALE 2 PUFFS BY MOUTH INTO THE LUNGS TWICE DAILY 10.7 g 4   carisoprodol (SOMA) 350 MG tablet TAKE 1 TABLET(350 MG) BY MOUTH  TWICE DAILY 60 tablet 3   Cholecalciferol (VITAMIN D-3) 1000 UNITS CAPS Take 1 capsule by mouth daily.     Coenzyme Q10 (COQ10) 400 MG CAPS Take 400 mg by mouth daily.     escitalopram (LEXAPRO) 20 MG tablet TAKE 1/2 TABLET(10 MG) BY MOUTH TWICE DAILY 90 tablet 1   ezetimibe (ZETIA) 10 MG tablet TAKE 1 TABLET(10 MG) BY MOUTH DAILY 90 tablet 3   fluticasone (FLONASE) 50 MCG/ACT nasal spray SHAKE LIQUID AND USE 2 SPRAYS IN EACH NOSTRIL IN THE MORNING AND AT BEDTIME 16 g 0   Garlic 1000 MG CAPS Take by mouth.     levothyroxine (SYNTHROID) 100 MCG tablet TAKE 1 TABLET(100 MCG) BY MOUTH DAILY BEFORE BREAKFAST 90 tablet 1   metoprolol tartrate (LOPRESSOR) 25 MG tablet TAKE 1 TABLET(25 MG) BY MOUTH TWICE DAILY 90 tablet 3   Multiple Vitamin (MULTIVITAMIN) capsule Take 1 capsule by mouth daily.     mupirocin ointment (BACTROBAN) 2 % Apply 1 application. topically 2 (two) times daily as needed. 22 g 0   nitroGLYCERIN (NITROSTAT) 0.4 MG SL tablet Place 1 tablet (0.4 mg total) under the tongue every 5 (five) minutes as needed. 25 tablet 3   omeprazole (PRILOSEC) 20 MG capsule Take 1 capsule (20 mg total) by mouth daily. 90 capsule 1  vitamin C (ASCORBIC ACID) 250 MG tablet Take 250 mg by mouth daily.     alendronate (FOSAMAX) 70 MG tablet Take 1 tablet (70 mg total) by mouth every 7 (seven) days. Take with a full glass of water on an empty stomach. (Patient not taking: Reported on 06/16/2023) 4 tablet 11   No current facility-administered medications on file prior to visit.     ROS see history of present illness  Objective  Physical Exam Vitals:   06/16/23 1131 06/16/23 1200  BP: 130/78 124/76  Pulse: 64   Temp: 98.4 F (36.9 C)   SpO2: 97%     BP Readings from Last 3 Encounters:  06/16/23 124/76  12/27/22 138/80  12/20/22 130/80   Wt Readings from Last 3 Encounters:  06/16/23 149 lb 6.4 oz (67.8 kg)  12/27/22 156 lb 3.2 oz (70.9 kg)  12/20/22 154 lb (69.9 kg)    Physical  Exam Constitutional:      General: She is not in acute distress.    Appearance: She is not diaphoretic.  Cardiovascular:     Rate and Rhythm: Normal rate and regular rhythm.     Heart sounds: Normal heart sounds.  Pulmonary:     Effort: Pulmonary effort is normal.     Breath sounds: Normal breath sounds.  Musculoskeletal:     Right lower leg: No edema.     Left lower leg: No edema.  Skin:    General: Skin is warm and dry.  Neurological:     Mental Status: She is alert.      Assessment/Plan: Please see individual problem list.  Chronic low back pain (Primary Area of Pain) (Bilateral) (L>R) Assessment & Plan: Chronic issue.  Had a flare of neck pain recently.  Has returned to her baseline.  She can continue Soma 1 tablet by mouth twice daily as needed.  Discussed that the [name] is metabolized into something like a benzodiazepine and that is why it would have helped with her anxiety.  Advised she should only take the [facility] for her chronic pain.   Adjustment disorder with mixed anxiety and depressed mood Assessment & Plan: Chronic issue.  Worsened recently with her family dynamics.  She will continue Lexapro 10 mg daily.  We will add buspirone 7.5 mg twice daily.  Discussed monitoring for any side effects with this.  Discussed the option for trying something like hydroxyzine if buspirone is not beneficial.  Discussed it may take a number of weeks before buspirone becomes beneficial.  Orders: -     busPIRone HCl; Take 1 tablet (7.5 mg total) by mouth 2 (two) times daily.  Dispense: 60 tablet; Refill: 3     Return in about 3 months (around 09/16/2023).   Marikay Alar, MD [facility]

## 2023-06-30 ENCOUNTER — Ambulatory Visit: Payer: Medicare Other | Admitting: Family Medicine

## 2023-07-10 ENCOUNTER — Ambulatory Visit (INDEPENDENT_AMBULATORY_CARE_PROVIDER_SITE_OTHER): Payer: Medicare Other | Admitting: *Deleted

## 2023-07-10 VITALS — Ht 65.0 in | Wt 149.0 lb

## 2023-07-10 DIAGNOSIS — Z0000 Encounter for general adult medical examination without abnormal findings: Secondary | ICD-10-CM | POA: Diagnosis not present

## 2023-07-10 NOTE — Patient Instructions (Signed)
Jodi Beasley , Thank you for taking time to come for your Medicare Wellness Visit. I appreciate your ongoing commitment to your health goals. Please review the following plan we discussed and let me know if I can assist you in the future.   Referrals/Orders/Follow-Ups/Clinician Recommendations: None  This is a list of the screening recommended for you and due dates:  Health Maintenance  Topic Date Due   Zoster (Shingles) Vaccine (1 of 2) 04/04/1966   DTaP/Tdap/Td vaccine (2 - Tdap) 05/05/2020   COVID-19 Vaccine (5 - 2023-24 season) 08/05/2022   Flu Shot  07/06/2023   Mammogram  04/05/2024   Medicare Annual Wellness Visit  07/09/2024   Pneumonia Vaccine  Completed   DEXA scan (bone density measurement)  Completed   Hepatitis C Screening  Completed   HPV Vaccine  Aged Out   Screening for Lung Cancer  Discontinued   Colon Cancer Screening  Discontinued    Advanced directives: (Copy Requested) Please bring a copy of your health care power of attorney and living will to the office to be added to your chart at your convenience.  Next Medicare Annual Wellness Visit scheduled for next year: No Declines at this time will call back later to schedule  Preventive Care 65 Years and Older, Female Preventive care refers to lifestyle choices and visits with your health care provider that can promote health and wellness. What does preventive care include? A yearly physical exam. This is also called an annual well check. Dental exams once or twice a year. Routine eye exams. Ask your health care provider how often you should have your eyes checked. Personal lifestyle choices, including: Daily care of your teeth and gums. Regular physical activity. Eating a healthy diet. Avoiding tobacco and drug use. Limiting alcohol use. Practicing safe sex. Taking low-dose aspirin every day. Taking vitamin and mineral supplements as recommended by your health care provider. What happens during an annual well  check? The services and screenings done by your health care provider during your annual well check will depend on your age, overall health, lifestyle risk factors, and family history of disease. Counseling  Your health care provider may ask you questions about your: Alcohol use. Tobacco use. Drug use. Emotional well-being. Home and relationship well-being. Sexual activity. Eating habits. History of falls. Memory and ability to understand (cognition). Work and work Astronomer. Reproductive health. Screening  You may have the following tests or measurements: Height, weight, and BMI. Blood pressure. Lipid and cholesterol levels. These may be checked every 5 years, or more frequently if you are over 13 years old. Skin check. Lung cancer screening. You may have this screening every year starting at age 64 if you have a 30-pack-year history of smoking and currently smoke or have quit within the past 15 years. Fecal occult blood test (FOBT) of the stool. You may have this test every year starting at age 28. Flexible sigmoidoscopy or colonoscopy. You may have a sigmoidoscopy every 5 years or a colonoscopy every 10 years starting at age 2. Hepatitis C blood test. Hepatitis B blood test. Sexually transmitted disease (STD) testing. Diabetes screening. This is done by checking your blood sugar (glucose) after you have not eaten for a while (fasting). You may have this done every 1-3 years. Bone density scan. This is done to screen for osteoporosis. You may have this done starting at age 57. Mammogram. This may be done every 1-2 years. Talk to your health care provider about how often you should have regular  mammograms. Talk with your health care provider about your test results, treatment options, and if necessary, the need for more tests. Vaccines  Your health care provider may recommend certain vaccines, such as: Influenza vaccine. This is recommended every year. Tetanus, diphtheria, and  acellular pertussis (Tdap, Td) vaccine. You may need a Td booster every 10 years. Zoster vaccine. You may need this after age 36. Pneumococcal 13-valent conjugate (PCV13) vaccine. One dose is recommended after age 69. Pneumococcal polysaccharide (PPSV23) vaccine. One dose is recommended after age 51. Talk to your health care provider about which screenings and vaccines you need and how often you need them. This information is not intended to replace advice given to you by your health care provider. Make sure you discuss any questions you have with your health care provider. Document Released: 12/18/2015 Document Revised: 08/10/2016 Document Reviewed: 09/22/2015 Elsevier Interactive Patient Education  2017 [employer].  Fall Prevention in the Home Falls can cause injuries. They can happen to people of all ages. There are many things you can do to make your home safe and to help prevent falls. What can I do on the outside of my home? Regularly fix the edges of walkways and driveways and fix any cracks. Remove anything that might make you trip as you walk through a door, such as a raised step or threshold. Trim any bushes or trees on the path to your home. Use bright outdoor lighting. Clear any walking paths of anything that might make someone trip, such as rocks or tools. Regularly check to see if handrails are loose or broken. Make sure that both sides of any steps have handrails. Any raised decks and porches should have guardrails on the edges. Have any leaves, snow, or ice cleared regularly. Use sand or salt on walking paths during winter. Clean up any spills in your garage right away. This includes oil or grease spills. What can I do in the bathroom? Use night lights. Install grab bars by the toilet and in the tub and shower. Do not use towel bars as grab bars. Use non-skid mats or decals in the tub or shower. If you need to sit down in the shower, use a plastic, non-slip stool. Keep  the floor dry. Clean up any water that spills on the floor as soon as it happens. Remove soap buildup in the tub or shower regularly. Attach bath mats securely with double-sided non-slip rug tape. Do not have throw rugs and other things on the floor that can make you trip. What can I do in the bedroom? Use night lights. Make sure that you have a light by your bed that is easy to reach. Do not use any sheets or blankets that are too big for your bed. They should not hang down onto the floor. Have a firm chair that has side arms. You can use this for support while you get dressed. Do not have throw rugs and other things on the floor that can make you trip. What can I do in the kitchen? Clean up any spills right away. Avoid walking on wet floors. Keep items that you use a lot in easy-to-reach places. If you need to reach something above you, use a strong step stool that has a grab bar. Keep electrical cords out of the way. Do not use floor polish or wax that makes floors slippery. If you must use wax, use non-skid floor wax. Do not have throw rugs and other things on the floor that can  make you trip. What can I do with my stairs? Do not leave any items on the stairs. Make sure that there are handrails on both sides of the stairs and use them. Fix handrails that are broken or loose. Make sure that handrails are as long as the stairways. Check any carpeting to make sure that it is firmly attached to the stairs. Fix any carpet that is loose or worn. Avoid having throw rugs at the top or bottom of the stairs. If you do have throw rugs, attach them to the floor with carpet tape. Make sure that you have a light switch at the top of the stairs and the bottom of the stairs. If you do not have them, ask someone to add them for you. What else can I do to help prevent falls? Wear shoes that: Do not have high heels. Have rubber bottoms. Are comfortable and fit you well. Are closed at the toe. Do not  wear sandals. If you use a stepladder: Make sure that it is fully opened. Do not climb a closed stepladder. Make sure that both sides of the stepladder are locked into place. Ask someone to hold it for you, if possible. Clearly mark and make sure that you can see: Any grab bars or handrails. First and last steps. Where the edge of each step is. Use tools that help you move around (mobility aids) if they are needed. These include: Canes. Walkers. Scooters. Crutches. Turn on the lights when you go into a dark area. Replace any light bulbs as soon as they burn out. Set up your furniture so you have a clear path. Avoid moving your furniture around. If any of your floors are uneven, fix them. If there are any pets around you, be aware of where they are. Review your medicines with your doctor. Some medicines can make you feel dizzy. This can increase your chance of falling. Ask your doctor what other things that you can do to help prevent falls. This information is not intended to replace advice given to you by your health care provider. Make sure you discuss any questions you have with your health care provider. Document Released: 09/17/2009 Document Revised: 04/28/2016 Document Reviewed: 12/26/2014 Elsevier Interactive Patient Education  2017 [employer].

## 2023-07-10 NOTE — Progress Notes (Signed)
Subjective:   Jodi Beasley is a 77 y.o. female who presents for Medicare Annual (Subsequent) preventive examination.  Visit Complete: Virtual  I connected with  Jodi Beasley on 07/10/23 by a audio enabled telemedicine application and verified that I am speaking with the correct person using two identifiers.  Patient Location: Home  Provider Location: Office/Clinic  I discussed the limitations of evaluation and management by telemedicine. The patient expressed understanding and agreed to proceed.  Vital Signs: Unable to obtain new vitals due to this being a telehealth visit.   Review of Systems     Cardiac Risk Factors include: advanced age (>33men, >65 women);hypertension;dyslipidemia;smoking/ tobacco exposure;Other (see comment), Risk factor comments: CAD     Objective:    Today's Vitals   07/10/23 1241  Weight: 149 lb (67.6 kg)  Height: 5\' 5"  (1.651 m)   Body mass index is 24.79 kg/m.     07/10/2023    1:06 PM 04/14/2022    2:36 PM 04/12/2021    2:15 PM 02/21/2020   10:38 AM 02/19/2019   11:16 AM 12/29/2018   11:19 AM 05/18/2018    5:36 PM  Advanced Directives  Does Patient Have a Medical Advance Directive? Yes Yes Yes Yes Yes Yes Yes  Type of Estate agent of [place];Living will Healthcare Power of [place];Living will Healthcare Power of [place];Living will Healthcare Power of [place];Living will Healthcare Power of [place];Living will Healthcare Power of [place];Living will Healthcare Power of [place];Living will  Does patient want to make changes to medical advance directive?  No - Patient declined No - Patient declined No - Patient declined No - Patient declined    Copy of Healthcare Power of Attorney in Chart? No - copy requested No - copy requested No - copy requested No - copy requested No - copy requested      Current Medications (verified) Outpatient Encounter Medications as of 07/10/2023  Medication Sig   albuterol (VENTOLIN HFA)  108 (90 Base) MCG/ACT inhaler INHALE 2 PUFFS INTO THE LUNGS EVERY 6 HOURS AS NEEDED FOR WHEEZING OR SHORTNESS OF BREATH   aspirin 81 MG tablet Take 81 mg by mouth daily.   atorvastatin (LIPITOR) 80 MG tablet TAKE 1 TABLET(80 MG) BY MOUTH DAILY   BREZTRI AEROSPHERE 160-9-4.8 MCG/ACT AERO INHALE 2 PUFFS BY MOUTH INTO THE LUNGS TWICE DAILY   carisoprodol (SOMA) 350 MG tablet TAKE 1 TABLET(350 MG) BY MOUTH TWICE DAILY   Cholecalciferol (VITAMIN D-3) 1000 UNITS CAPS Take 1 capsule by mouth daily.   Coenzyme Q10 (COQ10) 400 MG CAPS Take 400 mg by mouth daily.   escitalopram (LEXAPRO) 20 MG tablet TAKE 1/2 TABLET(10 MG) BY MOUTH TWICE DAILY   ezetimibe (ZETIA) 10 MG tablet TAKE 1 TABLET(10 MG) BY MOUTH DAILY   fluticasone (FLONASE) 50 MCG/ACT nasal spray SHAKE LIQUID AND USE 2 SPRAYS IN EACH NOSTRIL IN THE MORNING AND AT BEDTIME   levothyroxine (SYNTHROID) 100 MCG tablet TAKE 1 TABLET(100 MCG) BY MOUTH DAILY BEFORE BREAKFAST   metoprolol tartrate (LOPRESSOR) 25 MG tablet TAKE 1 TABLET(25 MG) BY MOUTH TWICE DAILY   Multiple Vitamin (MULTIVITAMIN) capsule Take 1 capsule by mouth daily.   mupirocin ointment (BACTROBAN) 2 % Apply 1 application. topically 2 (two) times daily as needed.   nitroGLYCERIN (NITROSTAT) 0.4 MG SL tablet Place 1 tablet (0.4 mg total) under the tongue every 5 (five) minutes as needed.   omeprazole (PRILOSEC) 20 MG capsule Take 1 capsule (20 mg total) by mouth daily.   vitamin C (  ASCORBIC ACID) 250 MG tablet Take 250 mg by mouth daily.   alendronate (FOSAMAX) 70 MG tablet Take 1 tablet (70 mg total) by mouth every 7 (seven) days. Take with a full glass of water on an empty stomach. (Patient not taking: Reported on 06/16/2023)   busPIRone (BUSPAR) 7.5 MG tablet Take 1 tablet (7.5 mg total) by mouth 2 (two) times daily.   Garlic 1000 MG CAPS Take by mouth.   No facility-administered encounter medications on file as of 07/10/2023.    Allergies (verified) Bacitracin-polymyx-neo-hc  [bacitra-neomycin-polymyxin-hc], Benzocaine, Darvon [propoxyphene hcl], Monosodium glutamate, Neosporin [neomycin-bacitracin zn-polymyx], Nicoderm [nicotine], Prednisone, and Propoxyphene   History: Past Medical History:  Diagnosis Date   Arrhythmia    Chicken pox    COPD (chronic obstructive pulmonary disease) (HCC)    Coronary artery disease    NSTEMI in 09/2008. Cath : 80% RCA and 95% first diagonal. PCI and 2 DES placement  RCA and 1 DES to diagonal. Complicated by acute diagonal stent thrombosis . Treated by thrombectomy. Most recent nuclear stress test in 2010 showed no ischemia with normal EF.    Depression    GERD (gastroesophageal reflux disease)    Hay fever    History of blood transfusion    History of hypercholesterolemia    History of syncope 2000   negative EP study   Hyperlipidemia    intolerance to statins except Crestor.    Hypertension    Hypothyroid    MI (myocardial infarction) (HCC)    Pneumonia, organism unspecified(486)    Sepsis (HCC) 04/27/2018   Past Surgical History:  Procedure Laterality Date   CORONARY ANGIOPLASTY WITH STENT PLACEMENT  2009   [facility], drug-eluting mid RCA,prox diag;   TONSILLECTOMY     TUBAL LIGATION     Family History  Problem Relation Age of Onset   Hypertension Mother    Colon cancer Father    Lung cancer Father        was a former smoker   Cancer Father        lung   Diabetes Father    Hypertension Other    Diabetes Other    Allergic rhinitis Neg Hx    Angioedema Neg Hx    Asthma Neg Hx    Atopy Neg Hx    Eczema Neg Hx    Immunodeficiency Neg Hx    Urticaria Neg Hx    Social History   Socioeconomic History   Marital status: Widowed    Spouse name: Not on file   Number of children: Not on file   Years of education: Not on file   Highest education level: Not on file  Occupational History   Not on file  Tobacco Use   Smoking status: Every Day    Current packs/day: 1.50    Average packs/day: 1.5  packs/day for 62.6 years (94.0 ttl pk-yrs)    Types: E-cigarettes, Cigarettes    Start date: 11/19/1960   Smokeless tobacco: Never   Tobacco comments:    1ppd as of 12/20/22 ep  Vaping Use   Vaping status: Former  Substance and Sexual Activity   Alcohol use: Never    Alcohol/week: 1.0 standard drink of alcohol    Types: 1 Standard drinks or equivalent per week   Drug use: Never   Sexual activity: Yes  Other Topics Concern   Not on file  Social History Narrative   2 cats 2 dogs in home.   Recently moved from [facility].  Custody of 53month old.   Social Determinants of Health   Financial Resource Strain: Low Risk  (07/10/2023)   Overall Financial Resource Strain (CARDIA)    Difficulty of Paying Living Expenses: Not hard at all  Food Insecurity: No Food Insecurity (07/10/2023)   Hunger Vital Sign    Worried About Running Out of Food in the Last Year: Never true    Ran Out of Food in the Last Year: Never true  Transportation Needs: No Transportation Needs (07/10/2023)   PRAPARE - Administrator, Civil Service (Medical): No    Lack of Transportation (Non-Medical): No  Physical Activity: Sufficiently Active (07/10/2023)   Exercise Vital Sign    Days of Exercise per Week: 7 days    Minutes of Exercise per Session: 40 min  Stress: No Stress Concern Present (07/10/2023)   [employer] of Occupational Health - Occupational Stress Questionnaire    Feeling of Stress : Not at all  Social Connections: Socially Isolated (07/10/2023)   Social Connection and Isolation Panel [NHANES]    Frequency of Communication with Friends and Family: Three times a week    Frequency of Social Gatherings with Friends and Family: Three times a week    Attends Religious Services: Never    Active Member of Clubs or Organizations: No    Attends Banker Meetings: Never    Marital Status: Widowed    Tobacco Counseling Ready to quit: No Counseling given: Not Answered Tobacco  comments: 1ppd as of 12/20/22 ep   Clinical Intake:  Pre-visit preparation completed: Yes  Pain : No/denies pain     BMI - recorded: 24.79 Nutritional Status: BMI of 19-24  Normal Nutritional Risks: None Diabetes: No  How often do you need to have someone help you when you read instructions, pamphlets, or other written materials from your doctor or pharmacy?: 1 - Never  Interpreter Needed?: No  Information entered by :: R.  LPN   Activities of Daily Living    07/10/2023   12:44 PM 07/06/2023    4:38 PM  In your present state of health, do you have any difficulty performing the following activities:  Hearing? 1 1  Comment wears aids   Vision? 0 0  Comment glasses   Difficulty concentrating or making decisions? 1 0  Comment some times   Walking or climbing stairs? 0 0  Dressing or bathing? 0 0  Doing errands, shopping? 0 0  Preparing Food and eating ? N N  Using the Toilet? N N  In the past six months, have you accidently leaked urine? Y N  Comment wears pads   Do you have problems with loss of bowel control? N N  Managing your Medications? N N  Managing your Finances? N N  Housekeeping or managing your Housekeeping? N N    Patient Care Team: Glori Luis, MD as PCP - General (Family Medicine) Croitoru, Rachelle Hora, MD as PCP - Cardiology (Cardiology)  Indicate any recent Medical Services you may have received from other than [facility] providers in the past year (date may be approximate).     Assessment:   This is a routine wellness examination for Jodi Beasley.  Hearing/Vision screen Hearing Screening - Comments:: Wears aids Vision Screening - Comments:: No issues  Dietary issues and exercise activities discussed:     Goals Addressed             This Visit's Progress    Patient Stated  None       Depression Screen    07/10/2023   12:56 PM 06/16/2023   11:33 AM 04/14/2022    2:27 PM 07/21/2021   11:03 AM 04/12/2021    2:14 PM 07/15/2020   11:09 AM  02/21/2020   10:39 AM  PHQ 2/9 Scores  PHQ - 2 Score 0 2 0 4 0 0 0  PHQ- 9 Score 1 5  12        Fall Risk    07/10/2023   12:47 PM 07/06/2023    4:38 PM 06/16/2023   11:33 AM 04/14/2022    2:20 PM 09/15/2021   11:05 AM  Fall Risk   Falls in the past year? 1  0 1   Number falls in past yr: 0 0 1 0 1  Injury with Fall? 1 1 0  1  Comment bruises and borken glasses, did not have to see doctor      Risk for fall due to : History of fall(s);Impaired balance/gait  No Fall Risks    Risk for fall due to: Comment tripped over a hole      Follow up Falls evaluation completed;Education provided;Falls prevention discussed  Falls evaluation completed Falls evaluation completed Falls evaluation completed    MEDICARE RISK AT HOME:  Medicare Risk at Home - 07/10/23 1249     Any stairs in or around the home? Yes    If so, are there any without handrails? Yes    Home free of loose throw rugs in walkways, pet beds, electrical cords, etc? Yes    Adequate lighting in your home to reduce risk of falls? Yes    Life alert? No    Use of a cane, walker or w/c? No    Grab bars in the bathroom? No    Shower chair or bench in shower? Yes    Elevated toilet seat or a handicapped toilet? Yes              Cognitive Function:    02/15/2017   11:48 AM  MMSE - Mini Mental State Exam  Orientation to time 5  Orientation to Place 5  Registration 3  Attention/ Calculation 5  Recall 3  Language- name 2 objects 2  Language- repeat 1  Language- follow 3 step command 3  Language- read & follow direction 1  Write a sentence 1  Copy design 1  Total score 30        07/10/2023    1:06 PM 02/21/2020   10:47 AM 02/19/2019   11:21 AM 02/16/2018   12:15 PM  6CIT Screen  What Year? 0 points 0 points 0 points 0 points  What month? 0 points 0 points 0 points 0 points  What time? 0 points 0 points 0 points 0 points  Count back from 20 2 points 0 points 0 points 0 points  Months in reverse 0 points 0 points 0  points 0 points  Repeat phrase 2 points 0 points 0 points 0 points  Total Score 4 points 0 points 0 points 0 points    Immunizations Immunization History  Administered Date(s) Administered   Fluad Quad(high Dose 65+) 09/01/2021   Influenza Nasal 08/24/2022   Influenza Split 10/22/2012, 09/20/2014, 09/21/2015, 08/15/2016   Influenza Whole 10/17/2013   Influenza, High Dose Seasonal PF 08/23/2017, 08/22/2018, 08/06/2019   Influenza-Unspecified 09/05/2015, 08/06/2019, 08/05/2020   PFIZER(Purple Top)SARS-COV-2 Vaccination 01/25/2020, 02/18/2020, 09/10/2020, 08/25/2021   Pneumococcal Conjugate-13 12/06/2007   Pneumococcal Polysaccharide-23 11/19/2013  Rsv, Bivalent, Protein Subunit Rsvpref,pf Verdis Frederickson) 08/24/2022   Td 05/05/2010   Zoster, Live 04/20/2011    TDAP status: Due, Education has been provided regarding the importance of this vaccine. Advised may receive this vaccine at local pharmacy or Health Dept. Aware to provide a copy of the vaccination record if obtained from local pharmacy or Health Dept. Verbalized acceptance and understanding. Patient stated that she had a reaction to the vaccine  Flu Vaccine status: Up to date  Pneumococcal vaccine status: Up to date  Covid-19 vaccine status: Completed vaccines  Qualifies for Shingles Vaccine? Yes   Zostavax completed Yes   Shingrix Completed?: No.    Education has been provided regarding the importance of this vaccine. Patient has been advised to call insurance company to determine out of pocket expense if they have not yet received this vaccine. Advised may also receive vaccine at local pharmacy or Health Dept. Verbalized acceptance and understanding.  Screening Tests Health Maintenance  Topic Date Due   Zoster Vaccines- Shingrix (1 of 2) 04/04/1966   DTaP/Tdap/Td (2 - Tdap) 05/05/2020   COVID-19 Vaccine (5 - 2023-24 season) 08/05/2022   Medicare Annual Wellness (AWV)  04/15/2023   INFLUENZA VACCINE  07/06/2023   Pneumonia  Vaccine 5+ Years old  Completed   DEXA SCAN  Completed   Hepatitis C Screening  Completed   HPV VACCINES  Aged Out   Lung Cancer Screening  Discontinued   Colonoscopy  Discontinued    Health Maintenance  Health Maintenance Due  Topic Date Due   Zoster Vaccines- Shingrix (1 of 2) 04/04/1966   DTaP/Tdap/Td (2 - Tdap) 05/05/2020   COVID-19 Vaccine (5 - 2023-24 season) 08/05/2022   Medicare Annual Wellness (AWV)  04/15/2023   INFLUENZA VACCINE  07/06/2023    Colorectal cancer screening: No longer required.   Mammogram status: Completed 5/24. Repeat every year  Bone Density status: Completed 12/22. Results reflect: Bone density results: OSTEOPOROSIS. Repeat every 2 years.  Lung Cancer Screening: (Low Dose CT Chest recommended if Age 45-80 years, 20 pack-year currently smoking OR have quit w/in 15years.) does qualify.   Lung Cancer Screening Referral:  Patient declines   Additional Screening:  Hepatitis C Screening: does qualify; Completed 2/17  Vision Screening: Recommended annual ophthalmology exams for early detection of glaucoma and other disorders of the eye. Is the patient up to date with their annual eye exam?  No  Who is the provider or what is the name of the office in which the patient attends annual eye exams? [facility], Will schedule an appointment in [place] where she lives now If pt is not established with a provider, would they like to be referred to a provider to establish care? No .   Dental Screening: Recommended annual dental exams for proper oral hygiene   Community Resource Referral / Chronic Care Management: CRR required this visit?  No   CCM required this visit?  No     Plan:     I have personally reviewed and noted the following in the patient's chart:   Medical and social history Use of alcohol, tobacco or illicit drugs  Current medications and supplements including opioid prescriptions. Patient is not currently taking opioid  prescriptions. Functional ability and status Nutritional status Physical activity Advanced directives List of other physicians Hospitalizations, surgeries, and ER visits in previous 12 months Vitals Screenings to include cognitive, depression, and falls Referrals and appointments  In addition, I have reviewed and discussed with patient certain preventive protocols, quality  metrics, and best practice recommendations. A written personalized care plan for preventive services as well as general preventive health recommendations were provided to patient.     Sydell Axon, LPN   12/10/1094   After Visit Summary: (MyChart) Due to this being a telephonic visit, the after visit summary with patients personalized plan was offered to patient via MyChart   Nurse Notes: Has an appointment with Dermatologist scheduled

## 2023-08-02 ENCOUNTER — Other Ambulatory Visit: Payer: Self-pay | Admitting: Family Medicine

## 2023-08-02 DIAGNOSIS — G8929 Other chronic pain: Secondary | ICD-10-CM

## 2023-08-19 ENCOUNTER — Other Ambulatory Visit: Payer: Self-pay | Admitting: Family Medicine

## 2023-08-19 DIAGNOSIS — F4323 Adjustment disorder with mixed anxiety and depressed mood: Secondary | ICD-10-CM

## 2023-09-14 ENCOUNTER — Other Ambulatory Visit: Payer: Self-pay | Admitting: Family Medicine

## 2023-09-14 DIAGNOSIS — G8929 Other chronic pain: Secondary | ICD-10-CM

## 2023-09-25 ENCOUNTER — Encounter: Payer: Self-pay | Admitting: Family Medicine

## 2023-09-25 ENCOUNTER — Ambulatory Visit: Payer: Medicare Other | Admitting: Family Medicine

## 2023-09-25 VITALS — BP 124/70 | HR 44 | Temp 97.9°F | Ht 65.0 in | Wt 143.4 lb

## 2023-09-25 DIAGNOSIS — R55 Syncope and collapse: Secondary | ICD-10-CM

## 2023-09-25 DIAGNOSIS — I10 Essential (primary) hypertension: Secondary | ICD-10-CM

## 2023-09-25 DIAGNOSIS — E7800 Pure hypercholesterolemia, unspecified: Secondary | ICD-10-CM

## 2023-09-25 DIAGNOSIS — E039 Hypothyroidism, unspecified: Secondary | ICD-10-CM

## 2023-09-25 DIAGNOSIS — I493 Ventricular premature depolarization: Secondary | ICD-10-CM | POA: Insufficient documentation

## 2023-09-25 LAB — COMPREHENSIVE METABOLIC PANEL
ALT: 17 U/L (ref 0–35)
AST: 23 U/L (ref 0–37)
Albumin: 4.3 g/dL (ref 3.5–5.2)
Alkaline Phosphatase: 71 U/L (ref 39–117)
BUN: 15 mg/dL (ref 6–23)
CO2: 25 meq/L (ref 19–32)
Calcium: 9.6 mg/dL (ref 8.4–10.5)
Chloride: 100 meq/L (ref 96–112)
Creatinine, Ser: 1.35 mg/dL — ABNORMAL HIGH (ref 0.40–1.20)
GFR: 38.18 mL/min — ABNORMAL LOW (ref >60.00)
Glucose, Bld: 87 mg/dL (ref 70–99)
Potassium: 4.8 meq/L (ref 3.5–5.1)
Sodium: 134 meq/L — ABNORMAL LOW (ref 135–145)
Total Bilirubin: 0.6 mg/dL (ref 0.2–1.2)
Total Protein: 7.2 g/dL (ref 6.0–8.3)

## 2023-09-25 LAB — LIPID PANEL
Cholesterol: 139 mg/dL (ref 0–200)
HDL: 47.9 mg/dL (ref >39.00)
LDL Cholesterol: 53 mg/dL (ref 0–99)
NonHDL: 90.9
Total CHOL/HDL Ratio: 3
Triglycerides: 191 mg/dL — ABNORMAL HIGH (ref 0.0–149.0)
VLDL: 38.2 mg/dL (ref 0.0–40.0)

## 2023-09-25 LAB — CBC
HCT: 46.3 % — ABNORMAL HIGH (ref 36.0–46.0)
Hemoglobin: 15 g/dL (ref 12.0–15.0)
MCHC: 32.4 g/dL (ref 30.0–36.0)
MCV: 94.7 fL (ref 78.0–100.0)
Platelets: 266 10*3/uL (ref 150.0–400.0)
RBC: 4.89 Mil/uL (ref 3.87–5.11)
RDW: 14.4 % (ref 11.5–15.5)
WBC: 8.5 10*3/uL (ref 4.0–10.5)

## 2023-09-25 LAB — TSH: TSH: 0.6 u[IU]/mL (ref 0.35–5.50)

## 2023-09-25 NOTE — Assessment & Plan Note (Addendum)
Suspect orthostasis as the cause of her symptoms given her description.  Orthostatics negative today.  EKG completed.  Lab work as outlined.  Advised to rise slowly from seated positions.  Advised to maintain adequate hydration.  Advised to let us know if she starts to feel lightheaded again.  Advised to seek medical attention in the emergency department if she has another syncopal episode.

## 2023-09-25 NOTE — Progress Notes (Signed)
Marikay Alar, MD Phone: [phone]  Jodi Beasley is a 77 y.o. female who presents today for F/u.  HYPERTENSION Disease Monitoring Home BP Monitoring not checking Chest pain- no    Dyspnea- no Medications Compliance-  taking metoprolol.  BMET    Component Value Date/Time   NA 131 (L) 06/27/2022 1052   NA 137 12/12/2017 1057   NA 133 (L) 07/26/2013 1023   K 4.6 06/27/2022 1052   K 4.1 07/26/2013 1023   CL 94 (L) 06/27/2022 1052   CL 103 07/26/2013 1023   CO2 26 06/27/2022 1052   CO2 26 07/26/2013 1023   GLUCOSE 92 06/27/2022 1052   GLUCOSE 97 07/26/2013 1023   BUN 15 06/27/2022 1052   BUN 9 12/12/2017 1057   BUN 11 07/26/2013 1023   CREATININE 1.21 (H) 06/27/2022 1052   CREATININE 0.83 07/26/2013 1023   CALCIUM 9.3 06/27/2022 1052   CALCIUM 9.1 07/26/2013 1023   GFRNONAA 10 (L) 04/21/2018 0626   GFRNONAA >60 07/26/2013 1023   GFRAA 12 (L) 04/21/2018 0626   GFRAA >60 07/26/2013 1023   HYPOTHYROIDISM Disease Monitoring Weight changes: no  Skin Changes: no Heat/Cold intolerance: no  Medication Monitoring Compliance:  taking synthroid   Last TSH:   Lab Results  Component Value Date   TSH 0.71 09/26/2022   Lightheadedness/syncope: Patient reports 3 weeks ago she got up out of bed and went to the bathroom and started to feel lightheaded.  She sat down on the floor.  She thinks she may have passed out for a few seconds as she had a bruise on her left knee and a knot on her head afterwards.  She subsequently got up and drank a large volume of water and ate some candy and started to feel better.  Within an hour she was back to her baseline.  She notes no recurrence.  She has had no headaches or vision changes since passing out.  Her knee has returned to normal.  Notes heart rate at home is 50-60.  She noted no preceding chest pain, shortness of breath, or palpitations.  She had a reassuring stress test earlier this year.  She had an echo earlier this year that revealed  normal EF and no valvular stenosis.  There was mild mitral and tricuspid regurgitation.  She is not on blood thinners.  Social History   Tobacco Use  Smoking Status Every Day   Current packs/day: 1.50   Average packs/day: 1.5 packs/day for 62.8 years (94.3 ttl pk-yrs)   Types: E-cigarettes, Cigarettes   Start date: 11/19/1960  Smokeless Tobacco Never  Tobacco Comments   1ppd as of 12/20/22 ep    Current Outpatient Medications on File Prior to Visit  Medication Sig Dispense Refill   albuterol (VENTOLIN HFA) 108 (90 Base) MCG/ACT inhaler INHALE 2 PUFFS INTO THE LUNGS EVERY 6 HOURS AS NEEDED FOR WHEEZING OR SHORTNESS OF BREATH 51 g 2   aspirin 81 MG tablet Take 81 mg by mouth daily.     atorvastatin (LIPITOR) 80 MG tablet TAKE 1 TABLET(80 MG) BY MOUTH DAILY 90 tablet 3   BREZTRI AEROSPHERE 160-9-4.8 MCG/ACT AERO INHALE 2 PUFFS BY MOUTH INTO THE LUNGS TWICE DAILY 10.7 g 4   carisoprodol (SOMA) 350 MG tablet TAKE 1 TABLET(350 MG) BY MOUTH TWICE DAILY 60 tablet 0   Cholecalciferol (VITAMIN D-3) 1000 UNITS CAPS Take 1 capsule by mouth daily.     Coenzyme Q10 (COQ10) 400 MG CAPS Take 400 mg by mouth daily.  escitalopram (LEXAPRO) 20 MG tablet TAKE 1/2 TABLET(10 MG) BY MOUTH TWICE DAILY 90 tablet 1   ezetimibe (ZETIA) 10 MG tablet TAKE 1 TABLET(10 MG) BY MOUTH DAILY 90 tablet 3   fluticasone (FLONASE) 50 MCG/ACT nasal spray SHAKE LIQUID AND USE 2 SPRAYS IN EACH NOSTRIL IN THE MORNING AND AT BEDTIME 16 g 0   levothyroxine (SYNTHROID) 100 MCG tablet TAKE 1 TABLET(100 MCG) BY MOUTH DAILY BEFORE BREAKFAST 90 tablet 1   metoprolol tartrate (LOPRESSOR) 25 MG tablet TAKE 1 TABLET(25 MG) BY MOUTH TWICE DAILY 90 tablet 3   Multiple Vitamin (MULTIVITAMIN) capsule Take 1 capsule by mouth daily.     mupirocin ointment (BACTROBAN) 2 % Apply 1 application. topically 2 (two) times daily as needed. 22 g 0   nitroGLYCERIN (NITROSTAT) 0.4 MG SL tablet Place 1 tablet (0.4 mg total) under the tongue every 5  (five) minutes as needed. 25 tablet 3   omeprazole (PRILOSEC) 20 MG capsule TAKE 1 CAPSULE(20 MG) BY MOUTH DAILY 90 capsule 1   vitamin C (ASCORBIC ACID) 250 MG tablet Take 250 mg by mouth daily.     No current facility-administered medications on file prior to visit.     ROS see history of present illness  Objective  Physical Exam Vitals:   09/25/23 1055  BP: 124/70  Pulse: (!) 44  Temp: 97.9 F (36.6 C)  SpO2: 97%   Laying blood pressure 145/85 pulse 61 Sitting blood pressure 154/76 pulse 33 Standing blood pressure 144/80 pulse 34  BP Readings from Last 3 Encounters:  09/25/23 124/70  06/16/23 124/76  12/27/22 138/80   Wt Readings from Last 3 Encounters:  09/25/23 143 lb 6.4 oz (65 kg)  07/10/23 149 lb (67.6 kg)  06/16/23 149 lb 6.4 oz (67.8 kg)    Physical Exam Constitutional:      General: She is not in acute distress.    Appearance: She is not diaphoretic.  Cardiovascular:     Rate and Rhythm: Regular rhythm. Bradycardia present.     Heart sounds: Normal heart sounds.  Pulmonary:     Effort: Pulmonary effort is normal.     Breath sounds: Normal breath sounds.  Musculoskeletal:     Comments: Left knee with no swelling or tenderness  Skin:    General: Skin is warm and dry.  Neurological:     Mental Status: She is alert.    EKG: Sinus rhythm, rate 81, frequent PVCs, no ischemic changes noted  Assessment/Plan: Please see individual problem list.  Syncope, unspecified syncope type Assessment & Plan: Suspect orthostasis as the cause of her symptoms given her description.  Orthostatics negative today.  EKG completed.  Lab work as outlined.  Advised to rise slowly from seated positions.  Advised to maintain adequate hydration.  Advised to let us know if she starts to feel lightheaded again.  Advised to seek medical attention in the emergency department if she has another syncopal episode.  Orders: -     EKG 12-Lead -     CBC -     Comprehensive metabolic  panel  Hypothyroidism, unspecified type Assessment & Plan: Chronic issue.  Check TSH.  Continue Synthroid 100 mcg daily.  Orders: -     TSH  Hypercholesterolemia -     Lipid panel  Primary hypertension Assessment & Plan: Chronic issue.  Adequately controlled.  Patient will continue metoprolol 25 mg twice daily.   PVC's (premature ventricular contractions) Assessment & Plan: I suspect this is contributing to her low  heart rate readings given that the pulse ox is likely not picking up on her PVCs.  She reports a known history of extra heartbeats.  We will monitor.  Advised to seek medical attention if she develops syncopal episodes again.  If she starts to get lightheaded again she will let us know.     Return in about 3 months (around 12/26/2023) for Transfer of care.   Marikay Alar, MD [facility]

## 2023-09-25 NOTE — Assessment & Plan Note (Signed)
Chronic issue.  Adequately controlled.  Patient will continue metoprolol 25 mg twice daily.

## 2023-09-25 NOTE — Patient Instructions (Signed)
Nice to see you. We will get lab work today and contact you with the results. If you have additional lightheadedness please let us know immediately.  If you pass out again please go to the emergency department.

## 2023-09-25 NOTE — Assessment & Plan Note (Signed)
Chronic issue.  Check TSH.  Continue Synthroid 100 mcg daily.

## 2023-09-25 NOTE — Assessment & Plan Note (Addendum)
I suspect this is contributing to her low heart rate readings given that the pulse ox is likely not picking up on her PVCs.  She reports a known history of extra heartbeats.  We will monitor.  Advised to seek medical attention if she develops syncopal episodes again.  If she starts to get lightheaded again she will let us know.

## 2023-10-21 ENCOUNTER — Other Ambulatory Visit: Payer: Self-pay | Admitting: Family Medicine

## 2023-10-21 DIAGNOSIS — G8929 Other chronic pain: Secondary | ICD-10-CM

## 2023-11-17 ENCOUNTER — Other Ambulatory Visit: Payer: Self-pay | Admitting: Family Medicine

## 2023-12-19 ENCOUNTER — Other Ambulatory Visit: Payer: Self-pay | Admitting: Family Medicine

## 2023-12-19 DIAGNOSIS — G8929 Other chronic pain: Secondary | ICD-10-CM

## 2024-01-03 ENCOUNTER — Encounter: Payer: Medicare Other | Admitting: Family Medicine

## 2024-02-12 ENCOUNTER — Other Ambulatory Visit: Payer: Self-pay | Admitting: Family Medicine

## 2024-02-12 DIAGNOSIS — G8929 Other chronic pain: Secondary | ICD-10-CM

## 2024-02-12 MED ORDER — CARISOPRODOL 350 MG PO TABS: ORAL_TABLET | ORAL | 0 refills | Status: DC

## 2024-02-12 NOTE — Telephone Encounter (Signed)
 Copied from CRM [id number]. Topic: Clinical - Medication Refill >> Feb 12, 2024 10:38 AM Truddie Crumble wrote: Most Recent Primary Care Visit:  Provider: Birdie Sons ERIC G  Department: [facility]  Visit Type: OFFICE VISIT  Date: 09/25/2023  Medication: carisoprodol (SOMA) 350 MG tablet  Has the patient contacted their pharmacy? Yes (Agent: If no, request that the patient contact the pharmacy for the refill. If patient does not wish to contact the pharmacy document the reason why and proceed with request.) (Agent: If yes, when and what did the pharmacy advise?)  Is this the correct pharmacy for this prescription? Yes If no, delete pharmacy and type the correct one.  This is the patient's preferred pharmacy:  [id number] - [place], [place] - [address] AT [facility] & [place] [address] [place] [address] Phone: [phone] Fax: [phone]  Has the prescription been filled recently? No  Is the patient out of the medication? Yes  Has the patient been seen for an appointment in the last year OR does the patient have an upcoming appointment? Yes  Can we respond through MyChart? Yes  Agent: Please be advised that Rx refills may take up to 3 business days. We ask that you follow-up with your pharmacy.

## 2024-02-16 ENCOUNTER — Encounter: Payer: Medicare Other | Admitting: Family Medicine

## 2024-02-19 ENCOUNTER — Other Ambulatory Visit: Payer: Self-pay

## 2024-02-19 DIAGNOSIS — F4323 Adjustment disorder with mixed anxiety and depressed mood: Secondary | ICD-10-CM

## 2024-02-19 MED ORDER — ESCITALOPRAM OXALATE 20 MG PO TABS: ORAL_TABLET | ORAL | 1 refills | Status: DC

## 2024-02-19 MED ORDER — OMEPRAZOLE 20 MG PO CPDR: 20.0000 mg | DELAYED_RELEASE_CAPSULE | Freq: Every day | ORAL | 1 refills | Status: DC

## 2024-02-19 NOTE — Telephone Encounter (Signed)
 Entered in error

## 2024-03-20 ENCOUNTER — Ambulatory Visit (INDEPENDENT_AMBULATORY_CARE_PROVIDER_SITE_OTHER)

## 2024-03-20 VITALS — BP 130/82 | HR 64 | Temp 98.3°F | Ht 65.0 in | Wt 136.8 lb

## 2024-03-20 DIAGNOSIS — J301 Allergic rhinitis due to pollen: Secondary | ICD-10-CM

## 2024-03-20 DIAGNOSIS — Z720 Tobacco use: Secondary | ICD-10-CM

## 2024-03-20 DIAGNOSIS — F3341 Major depressive disorder, recurrent, in partial remission: Secondary | ICD-10-CM

## 2024-03-20 DIAGNOSIS — M5442 Lumbago with sciatica, left side: Secondary | ICD-10-CM

## 2024-03-20 DIAGNOSIS — I2510 Atherosclerotic heart disease of native coronary artery without angina pectoris: Secondary | ICD-10-CM

## 2024-03-20 DIAGNOSIS — I10 Essential (primary) hypertension: Secondary | ICD-10-CM

## 2024-03-20 DIAGNOSIS — R55 Syncope and collapse: Secondary | ICD-10-CM

## 2024-03-20 DIAGNOSIS — Z2821 Immunization not carried out because of patient refusal: Secondary | ICD-10-CM

## 2024-03-20 DIAGNOSIS — E039 Hypothyroidism, unspecified: Secondary | ICD-10-CM

## 2024-03-20 DIAGNOSIS — G8929 Other chronic pain: Secondary | ICD-10-CM

## 2024-03-20 DIAGNOSIS — F4323 Adjustment disorder with mixed anxiety and depressed mood: Secondary | ICD-10-CM | POA: Diagnosis not present

## 2024-03-20 DIAGNOSIS — M5441 Lumbago with sciatica, right side: Secondary | ICD-10-CM

## 2024-03-20 DIAGNOSIS — J4489 Other specified chronic obstructive pulmonary disease: Secondary | ICD-10-CM

## 2024-03-20 DIAGNOSIS — F17209 Nicotine dependence, unspecified, with unspecified nicotine-induced disorders: Secondary | ICD-10-CM

## 2024-03-20 DIAGNOSIS — K219 Gastro-esophageal reflux disease without esophagitis: Secondary | ICD-10-CM

## 2024-03-20 DIAGNOSIS — N1832 Chronic kidney disease, stage 3b: Secondary | ICD-10-CM

## 2024-03-20 LAB — CBC
HCT: 45 % (ref 36.0–46.0)
Hemoglobin: 15.2 g/dL — ABNORMAL HIGH (ref 12.0–15.0)
MCHC: 33.7 g/dL (ref 30.0–36.0)
MCV: 95.3 fl (ref 78.0–100.0)
Platelets: 262 10*3/uL (ref 150.0–400.0)
RBC: 4.72 Mil/uL (ref 3.87–5.11)
RDW: 13.6 % (ref 11.5–15.5)
WBC: 8.4 10*3/uL (ref 4.0–10.5)

## 2024-03-20 LAB — LIPID PANEL
Cholesterol: 125 mg/dL (ref 0–200)
HDL: 47.5 mg/dL (ref >39.00)
LDL Cholesterol: 49 mg/dL (ref 0–99)
NonHDL: 77.55
Total CHOL/HDL Ratio: 3
Triglycerides: 143 mg/dL (ref 0.0–149.0)
VLDL: 28.6 mg/dL (ref 0.0–40.0)

## 2024-03-20 LAB — COMPREHENSIVE METABOLIC PANEL WITH GFR
ALT: 16 U/L (ref 0–35)
AST: 24 U/L (ref 0–37)
Albumin: 4.5 g/dL (ref 3.5–5.2)
Alkaline Phosphatase: 61 U/L (ref 39–117)
BUN: 17 mg/dL (ref 6–23)
CO2: 26 meq/L (ref 19–32)
Calcium: 9.6 mg/dL (ref 8.4–10.5)
Chloride: 101 meq/L (ref 96–112)
Creatinine, Ser: 1.24 mg/dL — ABNORMAL HIGH (ref 0.40–1.20)
GFR: 42.14 mL/min — ABNORMAL LOW (ref >60.00)
Glucose, Bld: 94 mg/dL (ref 70–99)
Potassium: 4.4 meq/L (ref 3.5–5.1)
Sodium: 136 meq/L (ref 135–145)
Total Bilirubin: 0.5 mg/dL (ref 0.2–1.2)
Total Protein: 7.3 g/dL (ref 6.0–8.3)

## 2024-03-20 LAB — TSH: TSH: 0.29 u[IU]/mL — ABNORMAL LOW (ref 0.35–5.50)

## 2024-03-20 MED ORDER — EZETIMIBE 10 MG PO TABS: 10.0000 mg | ORAL_TABLET | Freq: Every day | ORAL | 3 refills | Status: AC

## 2024-03-20 MED ORDER — ATORVASTATIN CALCIUM 80 MG PO TABS: 80.0000 mg | ORAL_TABLET | Freq: Every day | ORAL | 3 refills | Status: AC

## 2024-03-20 MED ORDER — OMEPRAZOLE 20 MG PO CPDR: 20.0000 mg | DELAYED_RELEASE_CAPSULE | Freq: Every day | ORAL | 1 refills | Status: AC

## 2024-03-20 MED ORDER — CARISOPRODOL 350 MG PO TABS: ORAL_TABLET | ORAL | 0 refills | Status: DC

## 2024-03-20 MED ORDER — ESCITALOPRAM OXALATE 20 MG PO TABS
20.0000 mg | ORAL_TABLET | Freq: Every day | ORAL | 3 refills | Status: AC
Start: 2024-03-20 — End: ?

## 2024-03-20 MED ORDER — LEVOTHYROXINE SODIUM 100 MCG PO TABS: ORAL_TABLET | ORAL | 1 refills | Status: DC

## 2024-03-20 NOTE — Assessment & Plan Note (Signed)
 Follow up and management per pulmonology.

## 2024-03-20 NOTE — Assessment & Plan Note (Signed)
 Stable on omeprazole 20 mg daily. Continue, refill sent.

## 2024-03-20 NOTE — Assessment & Plan Note (Signed)
 No chest pain.  She will f/u with cardiologist Dr. Sulema Endo in [place].

## 2024-03-20 NOTE — Progress Notes (Signed)
 Established Patient Office Visit   Subjective  Patient ID: Aila Terra, female    DOB: 1946/02/08  Age: 78 y.o. MRN: [id number]  Chief Complaint  Patient presents with   Transitions Of Care    She  has a past medical history of Adjustment disorder with mixed anxiety and depressed mood (11/19/2013), Arrhythmia, Chicken pox, COPD (chronic obstructive pulmonary disease) (HCC), Coronary artery disease, Depression, GERD (gastroesophageal reflux disease), Hay fever, History of blood transfusion, History of COVID-19 (01/15/2021), History of syncope (2000), Hyperlipidemia, Hypertension, Hypothyroid, and MI (myocardial infarction) (HCC).  HPI Established patient of Dr. Birdie Sons presenting for transition of care.   1) COPD, current every day smoker: Has been smoking cigarette, 1 pack/day since she was 78 years of age. She has COPD and sees pulmonologist Dr. Delton Coombes. Patient reports she is aware of risk of smoking and defers smoking cessation counseling, low dose CT for lung cancer screening,  Smoker since she was 17: 1 pack a day. Not interested in low dose CT lungs.   2) Due for Shingles and Tdap immunization: Patient does not want to update shingles and tetanus vaccine.   3) CAD, h/o MI, s/p stent: 4) Hyperlipidemia:  On metoprolol 25 mg twice daily, Aspirin 81 mg, Atorvastatin 80 mg, Zetia 10 mg daily. Follows up with Dr. Christell Constant in [facility].   5) Hypothyroidism: Currently on levothyroxine 100 mcg daily.  Needs refill.  Denies new symptoms or medication side effects.  6) Depression, adjustment disorder: Patient started on escitalopram around 2 years ago when her sister was diagnosed with lung cancer.  She reports she has been taking escitalopram 20 mg once a day.  She denies SI, HI.  7) Back pain: Chronic, ongoing for about 20 years. Sees chiropractor and has been on Carisoprodol/soma 350 mg once a day. Needs refill.   8) Seasonal allergy: Reports uses Flonase which helps with her symptoms.    9) GERD: Reports she has been dealing with acid reflux for years. She takes Omeprazole 20 mg once a day. She reports if she were to not take Omeprazole she gets heartburn. Requesting refill.   10) Social, SDOH: Patient lives in [place] and makes the drive from there to see [place]. She has her niece and nephews who live close to her and are there if she is in need for assistance. Does all ADLs by herself.    ROS As per HPI    Objective:     BP 130/82   Pulse 64   Temp 98.3 F (36.8 C) (Oral)   Ht 5\' 5"  (1.651 m)   Wt 136 lb 12.8 oz (62.1 kg)   SpO2 98%   BMI 22.76 kg/m      03/20/2024   11:12 AM 09/25/2023   11:14 AM 07/10/2023   12:56 PM  Depression screen PHQ 2/9  Decreased Interest 1 0 0  Down, Depressed, Hopeless 0 0 0  PHQ - 2 Score 1 0 0  Altered sleeping 0 0 0  Tired, decreased energy 1 1 1   Change in appetite 0 0 0  Feeling bad or failure about yourself  0 0 0  Trouble concentrating 0 0 0  Moving slowly or fidgety/restless 0 0 0  Suicidal thoughts 0 0 0  PHQ-9 Score 2 1 1   Difficult doing work/chores Not difficult at all Not difficult at all Not difficult at all      03/20/2024   11:12 AM 09/25/2023   11:14 AM 06/16/2023   11:33 AM 12/03/2015  1:56 PM  GAD 7 : Generalized Anxiety Score  Nervous, Anxious, on Edge 0 1 1 1   Control/stop worrying 0 0 0 0  Worry too much - different things 0 1 1 1   Trouble relaxing 0 0 1 1  Restless 0 0 0 0  Easily annoyed or irritable 0 1 2 1   Afraid - awful might happen 0 0 0 0  Total GAD 7 Score 0 3 5 4   Anxiety Difficulty  Not difficult at all Not difficult at all Somewhat difficult    Physical Exam Constitutional:      Appearance: Normal appearance.  HENT:     Head: Normocephalic and atraumatic.     Ears:     Comments: Wearing hearing aids.    Mouth/Throat:     Mouth: Mucous membranes are moist.  Eyes:     Conjunctiva/sclera: Conjunctivae normal.  Neck:     Thyroid: No thyroid mass or thyroid tenderness.   Cardiovascular:     Rate and Rhythm: Normal rate and regular rhythm.  Pulmonary:     Effort: Pulmonary effort is normal.  Abdominal:     General: Bowel sounds are normal.     Palpations: Abdomen is soft.  Musculoskeletal:     Cervical back: Neck supple. No rigidity.     Right lower leg: No edema.     Left lower leg: No edema.  Skin:    General: Skin is warm.  Neurological:     Mental Status: She is alert and oriented to person, place, and time.  Psychiatric:        Mood and Affect: Mood normal.        Behavior: Behavior normal.        No results found for any visits on 03/20/24.  The 10-year ASCVD risk score (Arnett DK, et al., 2019) is: 31.8%    Assessment & Plan:  Hypothyroidism, unspecified type Assessment & Plan: Chronic issue.  Recheck TSH today.  Continue Synthroid 100 mcg daily, refill sent  Orders: -     Lipid panel -     TSH  Adjustment disorder with mixed anxiety and depressed mood -     Escitalopram Oxalate; Take 1 tablet (20 mg total) by mouth daily.  Dispense: 90 tablet; Refill: 3  Chronic low back pain (Primary Area of Pain) (Bilateral) (L>R) Assessment & Plan: Chronic issue.  Stable on Soma 1 tablet by mouth twice daily as needed but takes about 1 tablet daily on average. Patient also sees chiropractor on a regular basis and is not interested in seeing a physical therapist. Continue Soma 1 tablet for her chronic pain. Revisit this during her follow up appointment to discuss medication s/e, age appropriateness, PDMP reviewed before refill sent.   Orders: -     Carisoprodol; TAKE 1 TABLET(350 MG) BY MOUTH TWICE DAILY  Dispense: 60 tablet; Refill: 0  Recurrent major depressive disorder, in partial remission (HCC) Assessment & Plan: Stable on Escitalopram 20 mg once a day. Continue, refill sent. We will check CBC, CMP today to r/o reversible/other treatable cause for mood changes.   Orders: -     Comprehensive metabolic panel with GFR -     CBC -      Escitalopram Oxalate; Take 1 tablet (20 mg total) by mouth daily.  Dispense: 90 tablet; Refill: 3  Tobacco use disorder, continuous  COPD (chronic obstructive pulmonary disease) with chronic bronchitis (HCC) Assessment & Plan: Follow up and management per pulmonology.    Stage 3b  chronic kidney disease [facility]) Assessment & Plan: Will repeat CMP. She is established with nephrologist Dr. Gari Junior at  [facility] nephrology, which she will continue.    Tobacco abuse Assessment & Plan: Brief smoking cessation counseling given.  Patient verbalizes that she is so aware of adverse health outcomes including COPD exacerbation, MI, risk of cancer associated with continuing tobacco use.  She declines smoking cessation counseling at this time and also declines low-dose CT for lung cancer screening.   Coronary artery disease involving native coronary artery of native heart without angina pectoris Assessment & Plan: No chest pain.  She will f/u with cardiologist Dr. Sulema Endo in [facility].    Primary hypertension Assessment & Plan: Continue Metoprolol 25 mg BID.   Gastroesophageal reflux disease, unspecified whether esophagitis present Assessment & Plan: Stable on omeprazole 20 mg daily. Continue, refill sent.    Seasonal allergic rhinitis due to pollen Assessment & Plan: Continue fluticasone nasal spray 2 sprays to each nostril daily for allergic rhinitis.    Immunization declined Assessment & Plan: Patient is due for Shingles and tdap immunization. Counseling on updating immunization was done today. She declined updating Tdap and shingrix in pharmacy or in our clinic due to personal choice (does not want s/e from Shingrix and believes she does not need Tdap immunization at this time in her life).    Other orders -     Atorvastatin Calcium; Take 1 tablet (80 mg total) by mouth daily.  Dispense: 90 tablet; Refill: 3 -     Ezetimibe; Take 1 tablet (10 mg total) by mouth daily.  Dispense: 90  tablet; Refill: 3 -     Levothyroxine Sodium; TAKE 1 TABLET(100 MCG) BY MOUTH DAILY BEFORE BREAKFAST  Dispense: 90 tablet; Refill: 1 -     Omeprazole; Take 1 capsule (20 mg total) by mouth daily.  Dispense: 90 capsule; Refill: 1   Patient also asked about screening mammogram. I discussed USPSTF recommendations for screening mammogram. Given patient's age, co morbidities, patient declines screening mammogram.  Return in about 3 months (around 06/19/2024) for Medication choice .   Jacklin Mascot, MD

## 2024-03-20 NOTE — Assessment & Plan Note (Signed)
 Continue fluticasone nasal spray 2 sprays to each nostril daily for allergic rhinitis.

## 2024-03-20 NOTE — Assessment & Plan Note (Signed)
 Chronic issue.  Recheck TSH today.  Continue Synthroid 100 mcg daily, refill sent

## 2024-03-20 NOTE — Assessment & Plan Note (Signed)
 Brief smoking cessation counseling given.  Patient verbalizes that she is so aware of adverse health outcomes including COPD exacerbation, MI, risk of cancer associated with continuing tobacco use.  She declines smoking cessation counseling at this time and also declines low-dose CT for lung cancer screening.

## 2024-03-20 NOTE — Assessment & Plan Note (Signed)
Continue Metoprolol 25 mg BID.

## 2024-03-20 NOTE — Assessment & Plan Note (Signed)
 Chronic issue.  Stable on Soma 1 tablet by mouth twice daily as needed but takes about 1 tablet daily on average. Patient also sees chiropractor on a regular basis and is not interested in seeing a physical therapist. Continue Soma 1 tablet for her chronic pain. Revisit this during her follow up appointment to discuss medication s/e, age appropriateness, PDMP reviewed before refill sent.

## 2024-03-20 NOTE — Assessment & Plan Note (Signed)
 Stable on Escitalopram 20 mg once a day. Continue, refill sent. We will check CBC, CMP today to r/o reversible/other treatable cause for mood changes.

## 2024-03-20 NOTE — Assessment & Plan Note (Signed)
 Patient is due for Shingles and tdap immunization. Counseling on updating immunization was done today. She declined updating Tdap and shingrix in pharmacy or in our clinic due to personal choice (does not want s/e from Shingrix and believes she does not need Tdap immunization at this time in her life).

## 2024-03-20 NOTE — Assessment & Plan Note (Signed)
 Will repeat CMP. She is established with nephrologist Dr. Gari Junior at  [facility] nephrology, which she will continue.

## 2024-03-21 ENCOUNTER — Telehealth: Payer: Self-pay

## 2024-03-21 ENCOUNTER — Ambulatory Visit: Payer: Self-pay

## 2024-03-21 NOTE — Telephone Encounter (Signed)
 See result note.

## 2024-03-21 NOTE — Telephone Encounter (Signed)
 Copied from CRM [id number]. Topic: General - Call Back - No Documentation >> Mar 20, 2024  4:55 PM DeAngela L wrote: Reason for CRM: Patient returning missed office call and would like a call back [phone] (M)

## 2024-03-21 NOTE — Telephone Encounter (Signed)
 Lvm for pt to give office a call back. Ok,ay to relay if message is relayed please notify office. Schedule lab appt for pt in 4-6 weeks

## 2024-03-21 NOTE — Telephone Encounter (Signed)
 Copied from CRM [id number]. Topic: General - Other >> Mar 21, 2024  9:17 AM Jodi Beasley wrote: Reason for CRM: patient returned call from nurse. Did advise patient on notes regarding medication. Patient understood.   Patient states that her PCP office had already called her back and she has no further questions at this time.   Reason for Disposition . Caller has already spoken with the PCP and has no further questions.  Protocols used: No Contact or Duplicate Contact Call-A-AH

## 2024-03-21 NOTE — Telephone Encounter (Signed)
 Pt was notified. Lab appt scheduled

## 2024-03-21 NOTE — Telephone Encounter (Signed)
 Yes, she should up thyroid medicine:  Pick any day of the week to take 150 mcg Levothyroxine and continue taking 100 mcg for 6 days a week, first thing in the morning in empty stomach.   Thank you,  Clark Cuff

## 2024-03-21 NOTE — Telephone Encounter (Signed)
 noted

## 2024-04-16 ENCOUNTER — Telehealth: Payer: Self-pay

## 2024-04-16 DIAGNOSIS — E039 Hypothyroidism, unspecified: Secondary | ICD-10-CM

## 2024-04-16 NOTE — Telephone Encounter (Signed)
 Patient need lab orders.

## 2024-04-16 NOTE — Telephone Encounter (Signed)
 Repeat lab for TSH with reflex ordered.   1. Hypothyroidism, unspecified type (Primary) - TSH Rfx on Abnormal to Free T4; Future  Jacklin Mascot, MD

## 2024-04-23 ENCOUNTER — Other Ambulatory Visit (INDEPENDENT_AMBULATORY_CARE_PROVIDER_SITE_OTHER)

## 2024-04-23 DIAGNOSIS — E039 Hypothyroidism, unspecified: Secondary | ICD-10-CM

## 2024-04-24 ENCOUNTER — Ambulatory Visit: Payer: Self-pay

## 2024-04-24 DIAGNOSIS — E039 Hypothyroidism, unspecified: Secondary | ICD-10-CM

## 2024-04-24 LAB — TSH RFX ON ABNORMAL TO FREE T4: TSH: 0.093 u[IU]/mL — ABNORMAL LOW (ref 0.450–4.500)

## 2024-04-24 LAB — T4F: T4,Free (Direct): 1.72 ng/dL (ref 0.82–1.77)

## 2024-04-24 NOTE — Progress Notes (Signed)
 Please let the patient know her TSH is slightly low but her active thyroid  hormone level of T4 is normal. She can go back to taking 100 mcg of Levothyroxine  once a day. We will repeat thyroid  hormone level in about 2 months. Labs ordered for future.   Jacklin Mascot, MD

## 2024-05-24 ENCOUNTER — Other Ambulatory Visit: Payer: Self-pay

## 2024-05-24 DIAGNOSIS — G8929 Other chronic pain: Secondary | ICD-10-CM

## 2024-05-28 ENCOUNTER — Other Ambulatory Visit: Payer: Self-pay

## 2024-05-28 NOTE — Telephone Encounter (Signed)
 1. Chronic low back pain (Primary Area of Pain) (Bilateral) (L>R) - carisoprodol  (SOMA ) 350 MG tablet; TAKE 1 TABLET(350 MG) BY MOUTH TWICE DAILY  Dispense: 180 tablet; Refill: 1  PDMP reviewed, appropriate.  Luke Shade, MD

## 2024-06-24 ENCOUNTER — Ambulatory Visit (INDEPENDENT_AMBULATORY_CARE_PROVIDER_SITE_OTHER)

## 2024-06-24 VITALS — BP 120/84 | HR 66 | Temp 99.0°F | Ht 65.0 in | Wt 139.8 lb

## 2024-06-24 DIAGNOSIS — E039 Hypothyroidism, unspecified: Secondary | ICD-10-CM

## 2024-06-24 DIAGNOSIS — Z720 Tobacco use: Secondary | ICD-10-CM

## 2024-06-24 DIAGNOSIS — G8929 Other chronic pain: Secondary | ICD-10-CM

## 2024-06-24 DIAGNOSIS — M5441 Lumbago with sciatica, right side: Secondary | ICD-10-CM

## 2024-06-24 DIAGNOSIS — J301 Allergic rhinitis due to pollen: Secondary | ICD-10-CM | POA: Diagnosis not present

## 2024-06-24 DIAGNOSIS — K219 Gastro-esophageal reflux disease without esophagitis: Secondary | ICD-10-CM

## 2024-06-24 DIAGNOSIS — R7303 Prediabetes: Secondary | ICD-10-CM

## 2024-06-24 DIAGNOSIS — M5442 Lumbago with sciatica, left side: Secondary | ICD-10-CM

## 2024-06-24 DIAGNOSIS — M25512 Pain in left shoulder: Secondary | ICD-10-CM

## 2024-06-24 DIAGNOSIS — M25511 Pain in right shoulder: Secondary | ICD-10-CM

## 2024-06-24 DIAGNOSIS — N1832 Chronic kidney disease, stage 3b: Secondary | ICD-10-CM

## 2024-06-24 DIAGNOSIS — I2510 Atherosclerotic heart disease of native coronary artery without angina pectoris: Secondary | ICD-10-CM

## 2024-06-24 DIAGNOSIS — J4489 Other specified chronic obstructive pulmonary disease: Secondary | ICD-10-CM

## 2024-06-24 DIAGNOSIS — M810 Age-related osteoporosis without current pathological fracture: Secondary | ICD-10-CM

## 2024-06-24 MED ORDER — NITROGLYCERIN 0.4 MG SL SUBL: 0.4000 mg | SUBLINGUAL_TABLET | SUBLINGUAL | 3 refills | Status: AC | PRN

## 2024-06-24 MED ORDER — CARISOPRODOL 350 MG PO TABS: ORAL_TABLET | ORAL | 1 refills | Status: AC

## 2024-06-24 NOTE — Assessment & Plan Note (Signed)
 DEXA: 2022 FRAX score indicates treatment for osteoporosis. Patient has tried medication in the past and did not tolerate it. She is not interested in restarting oral medication for osteoporosis. We will consider repeating bone scan at the end of this year and referral to osteoporosis clinic if patient interested in therapy.

## 2024-06-24 NOTE — Assessment & Plan Note (Signed)
 Management and f/u per pulmonology. Uses albuterol  PRN and Breztri . Occasional wheezing. Not interested in smoking cessation, aware of risks.

## 2024-06-24 NOTE — Assessment & Plan Note (Signed)
 Stable on omeprazole  20 mg daily. Continue.

## 2024-06-24 NOTE — Assessment & Plan Note (Signed)
 Follow up on kidney function with nephrologist on August 12.

## 2024-06-24 NOTE — Assessment & Plan Note (Signed)
 Appropriate use of nasal Flonase  demonstrated during today's appointment. Continue one spray nasal spray in each nostril daily.

## 2024-06-24 NOTE — Assessment & Plan Note (Signed)
 Chronic issue.  Stable on Soma  350 mg, 1 tablet by mouth once to twice daily (takes one tablet daily on average and only takes 2 tab per day if back pain is worse).  PDMP reviewed, last refill on 05/28/24 60 tablets. Refill sent.  Continue follow up with chiropractor.

## 2024-06-24 NOTE — Progress Notes (Signed)
 Established Patient Office Visit   Subjective  Patient ID: Jodi Beasley, female    DOB: [birth year]  Age: 78 y.o. MRN: [id number]  Chief Complaint  Patient presents with   Nicotine  Dependence   Hypothyroidism    She  has a past medical history of Adjustment disorder with mixed anxiety and depressed mood (11/19/2013), Arrhythmia, Chicken pox, COPD (chronic obstructive pulmonary disease) (HCC), Coronary artery disease, Depression, GERD (gastroesophageal reflux disease), Hay fever, History of blood transfusion, History of COVID-19 (01/15/2021), History of syncope (2000), Hyperlipidemia, Hypertension, Hypothyroid, and MI (myocardial infarction) (HCC).  HPI Discussed the use of AI scribe software for clinical note transcription with the patient, who gave verbal consent to proceed.  History of Present Illness Jodi Beasley is a 79 year old female with COPD, hypothyroidism, CAD, CKD, current every day smoker, lower back pain who presents for a follow-up visit.  1) COPD, current every day smoker: Patient with h/o 61 pack years history, COPD established with pulmonologist Dr. Shelah. She is not interested in quitting smoking, getting low dose CT for lung cancer screening at this time. On Prn Albuterol  and BID Breztri . Has upcoming appointment with him in August.  2) CAD, h/o MI, s/p stent, PVC in 2024: 3) Hyperlipidemia: - On metoprolol  25 mg twice daily, Aspirin  81 mg, Atorvastatin  80 mg, Zetia  10 mg daily.  Follows up Dr. Camellia [facility] for cardiology. No chest pain, lower leg edema. Has not needed nitroglycerin  recently but requests a refill.  4) Hypothyroidism: Currently on levothyroxine  100 mcg daily.  Last TSH from 04/23/24 with low TSH and T4 within normal range. Denies new symptoms or medication side effects. Is due for repeat TSH.  5) Depression, adjustment disorder: Patient started on escitalopram  around 2 years ago when her sister was diagnosed with lung cancer.  She reports she has  been taking escitalopram  20 mg once a day.  She denies SI, HI.  6) Back pain: Chronic, ongoing for about 20 plus years. Sees chiropractor and has been on Carisoprodol /soma  350 mg which she takes once a day on average. If she gets back pain that's worsened she takes it twice a day. She sees chiropractor once every 3 weeks. PDMP: last refill 05/28/24 30 days (60 tablets).  She has a history of fall but attributes them to external hazards rather than weakness.  7) Seasonal allergy: Reports uses Flonase  which helps with her symptoms. She has noted bad taste in her mouth with use of Flonase .   8) GERD: Stable on Omeprazole  20 mg once a day. She reports if she were to not take Omeprazole  she gets heartburn. Requesting refill.   9) CKD3b, second hyper pth: Jodi Beas[facility]. Has upcoming appointment with him in August. Gets lab prior to appointment with him. She uses Tylenol  for pain management and avoids ibuprofen due to kidney concerns.  10) Prediabetes: She says she has sweet tooth, consuming sugar regularly but avoids processed foods. Her A1c has been stable over the years.  ROS As per HPI    Objective:     BP 120/84 (BP Location: Right Arm, Patient Position: Sitting, Cuff Size: Small)   Pulse 66   Temp 99 F (37.2 C) (Oral)   Ht 5' 5 (1.651 m)   Wt 139 lb 12.8 oz (63.4 kg)   SpO2 97%   BMI 23.26 kg/m      06/24/2024    1:08 PM 03/20/2024   11:12 AM 09/25/2023   11:14 AM  Depression screen PHQ  2/9  Decreased Interest 1 1 0  Down, Depressed, Hopeless 0 0 0  PHQ - 2 Score 1 1 0  Altered sleeping 0 0 0  Tired, decreased energy 1 1 1   Change in appetite 0 0 0  Feeling bad or failure about yourself  0 0 0  Trouble concentrating 0 0 0  Moving slowly or fidgety/restless 0 0 0  Suicidal thoughts 0 0 0  PHQ-9 Score 2 2 1   Difficult doing work/chores Not difficult at all Not difficult at all Not difficult at all      06/24/2024    1:08 PM 03/20/2024   11:12 AM  09/25/2023   11:14 AM 06/16/2023   11:33 AM  GAD 7 : Generalized Anxiety Score  Nervous, Anxious, on Edge 0 0 1 1  Control/stop worrying 0 0 0 0  Worry too much - different things 1 0 1 1  Trouble relaxing 1 0 0 1  Restless 0 0 0 0  Easily annoyed or irritable 1 0 1 2  Afraid - awful might happen 0 0 0 0  Total GAD 7 Score 3 0 3 5  Anxiety Difficulty Not difficult at all  Not difficult at all Not difficult at all    Physical Exam Constitutional:      Appearance: Normal appearance.  HENT:     Head: Normocephalic and atraumatic.     Right Ear: External ear normal.     Left Ear: External ear normal.     Ears:     Comments: Wearing hearing aids.    Mouth/Throat:     Mouth: Mucous membranes are moist.  Eyes:     Conjunctiva/sclera: Conjunctivae normal.  Neck:     Thyroid : No thyroid  mass or thyroid  tenderness.  Cardiovascular:     Rate and Rhythm: Normal rate and regular rhythm.  Pulmonary:     Effort: Pulmonary effort is normal.  Abdominal:     General: Bowel sounds are normal.     Palpations: Abdomen is soft.     Tenderness: There is no guarding.  Musculoskeletal:     Cervical back: Neck supple. No rigidity or tenderness.     Lumbar back: Negative right straight leg raise test and negative left straight leg raise test.     Right lower leg: No edema.     Left lower leg: No edema.  Skin:    General: Skin is warm.  Neurological:     Mental Status: She is alert and oriented to person, place, and time.  Psychiatric:        Mood and Affect: Mood normal.        Behavior: Behavior normal.    No results found for any visits on 06/24/24.  The ASCVD Risk score (Arnett DK, et al., 2019) failed to calculate for the following reasons:   Risk score cannot be calculated because patient has a medical history suggesting prior/existing ASCVD    Assessment & Plan:   Acquired hypothyroidism Assessment & Plan: TSH previously low, indicating possible over-replacement of thyroid   hormone. Currently on 100 mcg levothyroxine . Order TSH and free T4 levels. Dose adjustment based on results.   Orders: -     TSH -     T4, free  Prediabetes Assessment & Plan: Check A1c today.   Orders: -     Hemoglobin A1c  Seasonal allergic rhinitis due to pollen Assessment & Plan: Appropriate use of nasal Flonase  demonstrated during today's appointment. Continue one spray nasal spray in  each nostril daily.    Chronic low back pain (Primary Area of Pain) (Bilateral) (L>R) Assessment & Plan: Chronic issue.  Stable on Soma  350 mg, 1 tablet by mouth once to twice daily (takes one tablet daily on average and only takes 2 tab per day if back pain is worse).  PDMP reviewed, last refill on 05/28/24 60 tablets. Refill sent.  Continue follow up with chiropractor.   Orders: -     Carisoprodol ; TAKE 1 TABLET(350 MG) BY MOUTH TWICE DAILY  Dispense: 180 tablet; Refill: 1  Chronic pain of both shoulders Assessment & Plan: Suspected to be from OA. Stable with Tylenol  1000 mg prn (do not exceed 2gm/day) as needed when this flares up.    COPD (chronic obstructive pulmonary disease) with chronic bronchitis (HCC) Assessment & Plan: Management and f/u per pulmonology. Uses albuterol  PRN and Breztri . Occasional wheezing. Not interested in smoking cessation, aware of risks.   Coronary artery disease involving native coronary artery of native heart without angina pectoris -     Nitroglycerin ; Place 1 tablet (0.4 mg total) under the tongue every 5 (five) minutes as needed.  Dispense: 25 tablet; Refill: 3  Gastroesophageal reflux disease, unspecified whether esophagitis present Assessment & Plan: Stable on omeprazole  20 mg daily. Continue.    Stage 3b chronic kidney disease [facility]) Assessment & Plan: Follow up on kidney function with nephrologist on August 12.   Tobacco abuse Assessment & Plan: Brief smoking cessation counseling given.  Patient verbalizes that she is so aware of adverse  health outcomes of smoking and declines further intervention (smoking cessation counseling and low-dose CT for lung cancer screening).    Age-related osteoporosis without current pathological fracture Assessment & Plan: DEXA: 2022 FRAX score indicates treatment for osteoporosis. Patient has tried medication in the past and did not tolerate it. She is not interested in restarting oral medication for osteoporosis. We will consider repeating bone scan at the end of this year and referral to osteoporosis clinic if patient interested in therapy.    I spent 40 minutes on the day of this face-to-face encounter reviewing the patient's medical and surgical history, medications, ongoing concerns, and reviewing the assessment and plan with the patient. This time also included counseling the patient on their health conditions and management options. Additionally, I spent time post-visit ordering and reviewing diagnostics and therapeutics with the patient.  Return in about 3 months (around 09/24/2024) for Chronic follow up .   Luke Shade, MD

## 2024-06-24 NOTE — Assessment & Plan Note (Signed)
 Check A1c today.

## 2024-06-24 NOTE — Assessment & Plan Note (Signed)
 Suspected to be from OA. Stable with Tylenol  1000 mg prn (do not exceed 2gm/day) as needed when this flares up.

## 2024-06-24 NOTE — Assessment & Plan Note (Signed)
 Brief smoking cessation counseling given.  Patient verbalizes that she is so aware of adverse health outcomes of smoking and declines further intervention (smoking cessation counseling and low-dose CT for lung cancer screening).

## 2024-06-24 NOTE — Assessment & Plan Note (Signed)
 TSH previously low, indicating possible over-replacement of thyroid  hormone. Currently on 100 mcg levothyroxine . Order TSH and free T4 levels. Dose adjustment based on results.

## 2024-06-25 ENCOUNTER — Ambulatory Visit: Payer: Self-pay

## 2024-06-25 ENCOUNTER — Other Ambulatory Visit: Payer: Self-pay | Admitting: Emergency Medicine

## 2024-06-25 LAB — HEMOGLOBIN A1C: Hgb A1c MFr Bld: 6.4 % (ref 4.6–6.5)

## 2024-06-25 LAB — TSH: TSH: 0.18 u[IU]/mL — ABNORMAL LOW (ref 0.35–5.50)

## 2024-06-25 LAB — T4, FREE: Free T4: 1.04 ng/dL (ref 0.60–1.60)

## 2024-06-25 NOTE — Telephone Encounter (Unsigned)
 Copied from CRM [id number]. Topic: Clinical - Medication Refill >> Jun 25, 2024 10:27 AM Benton O wrote: Medication: BREZTRI  AEROSPHERE 160-9-4.8 MCG/ACT AERO  Has the patient contacted their pharmacy? Yes but they sent in refill request but to dr icard  (Agent: If no, request that the patient contact the pharmacy for the refill. If patient does not wish to contact the pharmacy document the reason why and proceed with request.) (Agent: If yes, when and what did the pharmacy advise?)  This is the patient's preferred pharmacy:  [id number] - [place], [place] - [address] AT [facility] & [place] [address] [place] [address] Phone: [phone] Fax: [phone]  Is this the correct pharmacy for this prescription? Yes If no, delete pharmacy and type the correct one.  [facility] #[id number] - [place], [place] - [address] AT [facility] & [address] [place] [address] Phone: [phone] Fax: [phone]    Has the prescription been filled recently? No  Is the patient out of the medication? Yes  Has the patient been seen for an appointment in the last year OR does the patient have an upcoming appointment? Yes  Can we respond through MyChart? Yes  Agent: Please be advised that Rx refills may take up to 3 business days. We ask that you follow-up with your pharmacy.

## 2024-06-27 MED ORDER — BREZTRI AEROSPHERE 160-9-4.8 MCG/ACT IN AERO: 2.0000 | INHALATION_SPRAY | Freq: Two times a day (BID) | RESPIRATORY_TRACT | 4 refills | Status: DC

## 2024-07-04 ENCOUNTER — Ambulatory Visit: Admitting: Emergency Medicine

## 2024-08-21 ENCOUNTER — Encounter: Admitting: Pulmonary Disease

## 2024-08-27 ENCOUNTER — Other Ambulatory Visit: Payer: Self-pay

## 2024-08-27 DIAGNOSIS — F4323 Adjustment disorder with mixed anxiety and depressed mood: Secondary | ICD-10-CM

## 2024-08-27 DIAGNOSIS — F3341 Major depressive disorder, recurrent, in partial remission: Secondary | ICD-10-CM

## 2024-08-27 NOTE — Telephone Encounter (Signed)
 Copied from CRM #[id number]. Topic: Clinical - Medication Refill >> Aug 27, 2024 10:43 AM Chiquita SQUIBB wrote: Medication: escitalopram  (LEXAPRO ) 20 MG tablet [[id number]]  omeprazole  (PRILOSEC) 20 MG capsule [[id number]]  90 day supply for both    Has the patient contacted their pharmacy? Yes- Patients pharmacy stated they have not heard back from the office.  (Agent: If no, request that the patient contact the pharmacy for the refill. If patient does not wish to contact the pharmacy document the reason why and proceed with request.) (Agent: If yes, when and what did the pharmacy advise?)  This is the patient's preferred pharmacy:  [id number] - [place], [place] - [address] AT [facility] & [place] [address] [place] [address] Phone: [phone] Fax: [phone]  Is this the correct pharmacy for this prescription? Yes If no, delete pharmacy and type the correct one.   Has the prescription been filled recently? No  Is the patient out of the medication? Yes  Has the patient been seen for an appointment in the last year OR does the patient have an upcoming appointment? Yes  Can we respond through MyChart? Yes  Agent: Please be advised that Rx refills may take up to 3 business days. We ask that you follow-up with your pharmacy.

## 2024-08-27 NOTE — Telephone Encounter (Signed)
 Requests 90 day refill

## 2024-10-09 ENCOUNTER — Ambulatory Visit (INDEPENDENT_AMBULATORY_CARE_PROVIDER_SITE_OTHER)

## 2024-10-09 VITALS — BP 140/80 | HR 61 | Temp 99.0°F | Ht 65.0 in | Wt 133.0 lb

## 2024-10-09 DIAGNOSIS — F17218 Nicotine dependence, cigarettes, with other nicotine-induced disorders: Secondary | ICD-10-CM

## 2024-10-09 DIAGNOSIS — F3341 Major depressive disorder, recurrent, in partial remission: Secondary | ICD-10-CM | POA: Diagnosis not present

## 2024-10-09 DIAGNOSIS — F17200 Nicotine dependence, unspecified, uncomplicated: Secondary | ICD-10-CM | POA: Insufficient documentation

## 2024-10-09 DIAGNOSIS — Z2821 Immunization not carried out because of patient refusal: Secondary | ICD-10-CM

## 2024-10-09 DIAGNOSIS — E039 Hypothyroidism, unspecified: Secondary | ICD-10-CM | POA: Diagnosis not present

## 2024-10-09 DIAGNOSIS — N1832 Chronic kidney disease, stage 3b: Secondary | ICD-10-CM | POA: Diagnosis not present

## 2024-10-09 DIAGNOSIS — F4323 Adjustment disorder with mixed anxiety and depressed mood: Secondary | ICD-10-CM

## 2024-10-09 NOTE — Progress Notes (Signed)
 Established Patient Office Visit   Subjective  Patient ID: Jodi Beasley, female    DOB: 06-Dec-1945  Age: 78 y.o. MRN: [id number]  No chief complaint on file.   Discussed the use of AI scribe software for clinical note transcription with the patient, who gave verbal consent to proceed.  History of Present Illness Nomi Rudnicki is a 78 year old female who presents for chronic medication management.   -  She is experiencing emotional distress due to the illness of her last three close friends, who are frequently hospitalized. The potential loss of her friend Odella and cousin Inocente, who have been significant support figures, is particularly overwhelming. She is also dealing with the loss of her younger sister, for whom she moved to [facility] to care for, only to end up taking care of her sister instead.  She has developed stress-related eating habits, particularly consuming sugar when stressed. She is concerned about her ability to handle ongoing stressors, including her granddaughter's potential autism diagnosis and academic struggles. She is currently on Lexapro  20 mg and denies SI/HI. Previously went through counseling and is interested in in person counseling again.   - She has a history of smoking and currently smokes about a pack a day, which has increased from six cigarettes a day due to stress. She is not interested in quitting smoking at this time. She also politely declines low dose lung CT for lung cancer screening. She has COPD, is on multiple medications. She used to see pulmonologist Dr. Shelah but is transitioning to care to Dr. Tamea for closer proximity. . She has an upcoming appointment with Dr. Tamea in December.    - She has a h/o acquired hypothyroidism with normal T4 and fluctuating TSH. She takes Levothyroxine  100 mcg on an empty stomach.   - She has a h/o chronic low back pain and is on Soma  350 mg (prescribed as 2 tablets daily as needed) but takes one tablet a day.    - She has a h/o CKD with proteinuria and chronic mild hyponatremia (asymptomatic) and sees Dr. Douglas. Labs 09/12/24 from visit with him shows:  Cr: 1.34, GFR 41, Positive for urine microalbuminuria.  Hyponatremia Na:132  CBC: Hematocrit 45.2, hemoglobin 14.8, PTH normal  She does not want to start new medications to help with proteinuria (SGLT-2).   No chest pain, palpitations or other concerns.   ROS As per HPI    Objective:     BP (!) 140/80 (BP Location: Right Arm, Patient Position: Sitting, Cuff Size: Normal)   Pulse 61   Temp 99 F (37.2 C) (Oral)   Ht 5' 5 (1.651 m)   Wt 133 lb (60.3 kg)   SpO2 97%   BMI 22.13 kg/m      10/09/2024    1:18 PM 06/24/2024    1:08 PM 03/20/2024   11:12 AM  Depression screen PHQ 2/9  Decreased Interest 0 1 1  Down, Depressed, Hopeless 2 0 0  PHQ - 2 Score 2 1 1   Altered sleeping 0 0 0  Tired, decreased energy 2 1 1   Change in appetite 1 0 0  Feeling bad or failure about yourself  0 0 0  Trouble concentrating 0 0 0  Moving slowly or fidgety/restless 0 0 0  Suicidal thoughts 0 0 0  PHQ-9 Score 5 2 2   Difficult doing work/chores Not difficult at all Not difficult at all Not difficult at all      10/09/2024  1:18 PM 06/24/2024    1:08 PM 03/20/2024   11:12 AM 09/25/2023   11:14 AM  GAD 7 : Generalized Anxiety Score  Nervous, Anxious, on Edge 1 0 0 1  Control/stop worrying 1 0 0 0  Worry too much - different things 1 1 0 1  Trouble relaxing 1 1 0 0  Restless 0 0 0 0  Easily annoyed or irritable 0 1 0 1  Afraid - awful might happen 0 0 0 0  Total GAD 7 Score 4 3 0 3  Anxiety Difficulty Not difficult at all Not difficult at all  Not difficult at all      10/09/2024    1:18 PM 06/24/2024    1:08 PM 03/20/2024   11:12 AM  Depression screen PHQ 2/9  Decreased Interest 0 1 1  Down, Depressed, Hopeless 2 0 0  PHQ - 2 Score 2 1 1   Altered sleeping 0 0 0  Tired, decreased energy 2 1 1   Change in appetite 1 0 0  Feeling bad or  failure about yourself  0 0 0  Trouble concentrating 0 0 0  Moving slowly or fidgety/restless 0 0 0  Suicidal thoughts 0 0 0  PHQ-9 Score 5 2 2   Difficult doing work/chores Not difficult at all Not difficult at all Not difficult at all      10/09/2024    1:18 PM 06/24/2024    1:08 PM 03/20/2024   11:12 AM 09/25/2023   11:14 AM  GAD 7 : Generalized Anxiety Score  Nervous, Anxious, on Edge 1 0 0 1  Control/stop worrying 1 0 0 0  Worry too much - different things 1 1 0 1  Trouble relaxing 1 1 0 0  Restless 0 0 0 0  Easily annoyed or irritable 0 1 0 1  Afraid - awful might happen 0 0 0 0  Total GAD 7 Score 4 3 0 3  Anxiety Difficulty Not difficult at all Not difficult at all  Not difficult at all   SDOH Screenings   Food Insecurity: No Food Insecurity (10/08/2024)  Housing: Low Risk  (10/08/2024)  Transportation Needs: No Transportation Needs (10/08/2024)  Utilities: Not At Risk (07/10/2023)  Alcohol Screen: Low Risk  (07/10/2023)  Depression (PHQ2-9): Medium Risk (10/09/2024)  Financial Resource Strain: Medium Risk (10/08/2024)  Physical Activity: Insufficiently Active (10/08/2024)  Social Connections: Moderately Isolated (10/08/2024)  Stress: Stress Concern Present (10/08/2024)  Tobacco Use: High Risk (10/09/2024)  Health Literacy: Adequate Health Literacy (07/10/2023)     Physical Exam Constitutional:      General: She is not in acute distress.    Appearance: Normal appearance.  HENT:     Head: Normocephalic and atraumatic.     Comments: Wearing hearing aids     Mouth/Throat:     Mouth: Mucous membranes are moist.  Neck:     Thyroid : No thyroid  mass or thyroid  tenderness.  Cardiovascular:     Rate and Rhythm: Normal rate and regular rhythm.  Pulmonary:     Effort: Pulmonary effort is normal.     Breath sounds: Wheezing (bilateral wheezes without crackles noted) present.  Abdominal:     General: Bowel sounds are normal.     Palpations: Abdomen is soft.     Tenderness: There is  no guarding.  Musculoskeletal:     Cervical back: Neck supple. No rigidity.     Right lower leg: No edema.     Left lower leg: No edema.  Skin:  General: Skin is warm.  Neurological:     Mental Status: She is alert and oriented to person, place, and time.  Psychiatric:        Mood and Affect: Mood is depressed.        Behavior: Behavior normal. Behavior is cooperative.        Thought Content: Thought content does not include homicidal or suicidal ideation. Thought content does not include homicidal or suicidal plan.        No results found for any visits on 10/09/24.  The ASCVD Risk score (Arnett DK, et al., 2019) failed to calculate for the following reasons:   Risk score cannot be calculated because patient has a medical history suggesting prior/existing ASCVD     Assessment & Plan:   Assessment & Plan Acquired hypothyroidism - On levothyroxine  100 mcg daily. Recent labs show low TSH with normal T4, suggesting potential over-replacement.  - Repeat TSH and T4 levels to assess thyroid  function. Adjust medication based on lab results. Patient without symptoms of hyper/hypothyroidism.  Orders:   TSH   T4, free  Recurrent major depressive disorder, in partial remission - Chronic however she is experiencing emotional distress due to loss, leading to stress and sense of loneliness.  Continue Escitalopram  20 mg daily.  Interested in in-person therapy  - Provided website link for local in-person counselors ([email or web address])  - Referred to clinical social worker for resources and support. - Encouraged exploration of coping strategies and support systems. Orders:   AMB Referral [facility] Care Management  Adjustment disorder with mixed anxiety and depressed mood Plan per recurrent MDD     Stage 3b chronic kidney disease (HCC) Reviewed recent labs from nephrology visit. Patient declines initiation of SGLT-2 group medications for CKD with proteinuria. Continue follow up with  nephrology. Avoid diuretics given h/o mild hyponatremia.      Cigarette nicotine  dependence with other nicotine -induced disorder Smoking increased to one pack per day due to recent stress. Declines lung cancer screening and not interested in quitting or pharmacological intervention for smoking cessations. Understands heath risks related to ongoing smoking and benefits related to smoking cessation but elects to continue smoking.  - Monitor smoking habits and discuss risks at future visits.    Immunization declined She qualifies for shingles immunization but politely declines it.       Return in about 6 months (around 04/08/2025) for Chronic follow up .   Luke Shade, MD

## 2024-10-09 NOTE — Assessment & Plan Note (Addendum)
 Plan per recurrent MDD

## 2024-10-09 NOTE — Assessment & Plan Note (Addendum)
 Reviewed recent labs from nephrology visit. Patient declines initiation of SGLT-2 group medications for CKD with proteinuria. Continue follow up with nephrology. Avoid diuretics given h/o mild hyponatremia.

## 2024-10-09 NOTE — Patient Instructions (Addendum)
 [email or web address]   You can look into above website to find therapists near you.   I am requesting our clinical social worker to reach out to you via a phone call that way we can get more resources as well.   I will update you on your thyroid  level once I have the lab results back.   Continue current medications without change.   Follow up with Dr. Abbey in 6 months or sooner.

## 2024-10-09 NOTE — Assessment & Plan Note (Addendum)
-   Chronic however she is experiencing emotional distress due to loss, leading to stress and sense of loneliness.  Continue Escitalopram  20 mg daily.  Interested in in-person therapy  - Provided website link for local in-person counselors ([email or web address])  - Referred to clinical social worker for resources and support. - Encouraged exploration of coping strategies and support systems. Orders:   AMB Referral [facility] Care Management

## 2024-10-09 NOTE — Assessment & Plan Note (Addendum)
 She qualifies for shingles immunization but politely declines it.

## 2024-10-09 NOTE — Assessment & Plan Note (Signed)
 Smoking increased to one pack per day due to recent stress. Declines lung cancer screening and not interested in quitting or pharmacological intervention for smoking cessations. Understands heath risks related to ongoing smoking and benefits related to smoking cessation but elects to continue smoking.  - Monitor smoking habits and discuss risks at future visits.

## 2024-10-09 NOTE — Assessment & Plan Note (Addendum)
-   On levothyroxine  100 mcg daily. Recent labs show low TSH with normal T4, suggesting potential over-replacement.  - Repeat TSH and T4 levels to assess thyroid  function. Adjust medication based on lab results. Patient without symptoms of hyper/hypothyroidism.  Orders:   TSH   T4, free

## 2024-10-10 ENCOUNTER — Ambulatory Visit: Payer: Self-pay

## 2024-10-10 DIAGNOSIS — E039 Hypothyroidism, unspecified: Secondary | ICD-10-CM

## 2024-10-10 LAB — T4, FREE: Free T4: 1.1 ng/dL (ref 0.60–1.60)

## 2024-10-10 LAB — TSH: TSH: 0.05 u[IU]/mL — ABNORMAL LOW (ref 0.35–5.50)

## 2024-10-10 MED ORDER — LEVOTHYROXINE SODIUM 75 MCG PO TABS
75.0000 ug | ORAL_TABLET | Freq: Every day | ORAL | 1 refills | Status: AC
Start: 2024-10-10 — End: ?

## 2024-10-10 NOTE — Progress Notes (Signed)
 1. Acquired hypothyroidism (Primary) - levothyroxine  (SYNTHROID ) 75 MCG tablet; Take 1 tablet (75 mcg total) by mouth daily before breakfast.  Dispense: 90 tablet; Refill: 1  Jafari Mckillop, MD

## 2024-10-11 ENCOUNTER — Telehealth: Payer: Self-pay

## 2024-10-11 NOTE — Progress Notes (Signed)
 Complex Care Management Note Care Guide Note  10/11/2024 Name: Jodi Beasley MRN: [id number] DOB: 1946-06-25   Complex Care Management Outreach Attempts: An unsuccessful telephone outreach was attempted today to offer the patient information about available complex care management services.  Follow Up Plan:  Additional outreach attempts will be made to offer the patient complex care management information and services.   Encounter Outcome:  No Answer  [name]  [facility], [facility] VBCI Assistant Direct Dial: [phone]  Fax: [phone]

## 2024-10-11 NOTE — Progress Notes (Signed)
 Complex Care Management Note  Care Guide Note 10/11/2024 Name: Reighlyn Elmes MRN: [id number] DOB: 15-Feb-1946  Ardyn Forge is a 78 y.o. year old female who sees Bair, Kalpana, MD for primary care. I reached out to Lolita Becket by phone today to offer complex care management services.  Ms. Pajak was given information about Complex Care Management services today including:   The Complex Care Management services include support from the care team which includes your Nurse Care Manager, Clinical Social Worker, or Pharmacist.  The Complex Care Management team is here to help remove barriers to the health concerns and goals most important to you. Complex Care Management services are voluntary, and the patient may decline or stop services at any time by request to their care team member.   Complex Care Management Consent Status: Patient wishes to consider information provided and/or speak with a member of the care team before deciding to participate in complex care management services.   Follow up plan:  The care guide will reach out to the patient again over the next 7 days.  Encounter Outcome:  Patient Request to Call Back  [name]  [facility], [facility] VBCI Assistant Direct Dial: [phone]  Fax: [phone]

## 2024-10-16 NOTE — Progress Notes (Signed)
 Complex Care Management Note  Care Guide Note 10/16/2024 Name: Madelein Mahadeo MRN: [id number] DOB: 29-Jul-1946  Kazzandra Desaulniers is a 78 y.o. year old female who sees Bair, Kalpana, MD for primary care. I reached out to Lolita Becket by phone today to offer complex care management services.  Ms. Smejkal was given information about Complex Care Management services today including:   The Complex Care Management services include support from the care team which includes your Nurse Care Manager, Clinical Social Worker, or Pharmacist.  The Complex Care Management team is here to help remove barriers to the health concerns and goals most important to you. Complex Care Management services are voluntary, and the patient may decline or stop services at any time by request to their care team member.   Complex Care Management Consent Status: Patient did not agree to participate in complex care management services at this time.  Follow up plan:  Patient will follow up with PCP as needed.  Encounter Outcome:  Patient Refused  Dreama Agent [facility], [facility] VBCI Assistant Direct Dial: [phone]  Fax: [phone]

## 2024-10-16 NOTE — Telephone Encounter (Unsigned)
 Copied from CRM [id number]. Topic: Referral - Status >> Oct 16, 2024  3:02 PM [name] wrote: Reason for CRM: Patient is calling to advise Dr.Bair that the referral for social services, Patient would like to know she postponded meeting with them until after the first of the year.

## 2024-11-07 ENCOUNTER — Ambulatory Visit: Admitting: Pulmonary Disease

## 2024-11-07 ENCOUNTER — Encounter: Payer: Self-pay | Admitting: Pulmonary Disease

## 2024-11-07 VITALS — BP 136/76 | HR 62 | Temp 97.6°F | Ht 65.0 in | Wt 138.0 lb

## 2024-11-07 DIAGNOSIS — J4489 Other specified chronic obstructive pulmonary disease: Secondary | ICD-10-CM

## 2024-11-07 DIAGNOSIS — F17210 Nicotine dependence, cigarettes, uncomplicated: Secondary | ICD-10-CM | POA: Diagnosis not present

## 2024-11-07 DIAGNOSIS — J309 Allergic rhinitis, unspecified: Secondary | ICD-10-CM

## 2024-11-07 MED ORDER — ALBUTEROL SULFATE HFA 108 (90 BASE) MCG/ACT IN AERS: INHALATION_SPRAY | RESPIRATORY_TRACT | 6 refills | Status: AC

## 2024-11-07 MED ORDER — BREZTRI AEROSPHERE 160-9-4.8 MCG/ACT IN AERO: 2.0000 | INHALATION_SPRAY | Freq: Two times a day (BID) | RESPIRATORY_TRACT | 11 refills | Status: AC

## 2024-11-07 NOTE — Progress Notes (Signed)
 Subjective:    Patient ID: Jodi Beasley, female    DOB: 1946/08/28, 78 y.o.   MRN: [id number]  Patient Care Team: Bair, Kalpana, MD as PCP - General (Family Medicine) Georgina Camellia RAMAN, MD as Referring Physician Shelah Lamar RAMAN, MD as Consulting Physician (Pulmonary Disease) Douglas Rule, MD as Consulting Physician (Nephrology)  Chief Complaint  Patient presents with   COPD    Occasional cough, shortness of breath and wheezing, symptoms worse in the summer.     BACKGROUND: Patient is a 78 year old current smoker (1 PPD, 96 PY) with moderate to severe COPD and additional history as noted below who presents to transition care to our clinic.  Previously followed by Dr. Lamar Shelah at our [facility] clinic.  HPI Discussed the use of AI scribe software for clinical note transcription with the patient, who gave verbal consent to proceed.  History of Present Illness   Jodi Beasley is a 78 year old female with COPD who presents to transfer care. She previously followed up with Dr. Byrum for continuation of care for COPD.  She has a known history of COPD with asthma overlap, currently managed with Breztri , used twice daily. She also uses albuterol  albuterol  as rescue. Her symptoms are exacerbated by smoking a pack of cigarettes daily and are particularly aggravated during hot weather, leading to increased coughing if her nose starts dripping. She occasionally produces white-green sputum without blood. No recent increase in cough or shortness of breath outside of these conditions.  Her last full pulmonary function test was performed in 2017, she had last simple spirometry performed in 2022. She has not undergone recent lung cancer screening and prefers to avoid such screenings or treatments due to family experiences with cancer. She has received her flu, COVID, and pneumonia vaccinations but has opted out of the shingles vaccine.  Family history is significant for cancer, as her father and  sister both had cancer after quitting smoking. She moved to [facility] to care for her sick sister and continues to smoke a pack a day.   She does not endorse any fevers, chills or sweats.  As noted, she has not had any cough or sputum production.  No hemoptysis.  No weight loss or anorexia.  Not motivated to quit smoking.  As noted above she declines lung cancer screening.    DATA 0/10/2016 PFTs: FEV1 1.31 L or 56% predicted, FVC 2.12 L or 69% predicted, FEV1/FVC 62%.  There is an DRAMATIC bronchodilator response with FEV1 improving to 1.71 L or 74% predicted postbronchodilator.  Lung volumes mildly reduced diffusion capacity moderately reduced. 01/06/2021 simple spirometry: FEV1 1.41 L or 62% predicted, FVC 2.69 L or 89% predicted, FEV1/FVC 52%  Review of Systems A 10 point review of systems was performed and it is as noted above otherwise negative.   Past Medical History:  Diagnosis Date   Adjustment disorder with mixed anxiety and depressed mood 11/19/2013   Arrhythmia    Chicken pox    COPD (chronic obstructive pulmonary disease) (HCC)    Coronary artery disease    NSTEMI in 09/2008. Cath : 80% RCA and 95% first diagonal. PCI and 2 DES placement  RCA and 1 DES to diagonal. Complicated by acute diagonal stent thrombosis . Treated by thrombectomy. Most recent nuclear stress test in 2010 showed no ischemia with normal EF.    Depression    GERD (gastroesophageal reflux disease)    Hay fever    History of blood transfusion    History of  COVID-19 01/15/2021   History of syncope 2000   negative EP study   Hyperlipidemia    intolerance to statins except Crestor .    Hypertension    Hypothyroid    MI (myocardial infarction) (HCC)    1996-97    Past Surgical History:  Procedure Laterality Date   CORONARY ANGIOPLASTY WITH STENT PLACEMENT  2009   [facility], drug-eluting mid RCA,prox diag;   TONSILLECTOMY     TUBAL LIGATION      Patient Active Problem List   Diagnosis Date Noted    Nicotine  dependence 10/09/2024   Recurrent major depressive disorder, in partial remission 03/20/2024   Immunization declined 03/20/2024   PVC's (premature ventricular contractions) 09/25/2023   Cervical spondylosis 04/25/2023   Frequent PVCs 04/10/2023   Irregular heartbeat 09/15/2021   Chronic shoulder pain 01/15/2021   Prediabetes 01/15/2021   Dental anomaly 02/22/2019   GERD (gastroesophageal reflux disease) 11/21/2018   Stage 3b chronic kidney disease (HCC) 04/27/2018   Problems influencing health status 12/27/2017   Osteoarthritis of AC (acromioclavicular) joint (Right) 12/27/2017   Grade 1 Anterolisthesis of L4 on L5 12/27/2017   DDD (degenerative disc disease), lumbar 12/27/2017   Lumbar spondylosis 12/27/2017   Lumbar central spinal stenosis (L4-5) 12/27/2017   Lumbar foraminal stenosis (L4-5) (Bilateral) 12/27/2017   Lumbar lateral recess stenosis (L4-5) (Bilateral) 12/27/2017   Osteoarthritis of lumbar facet joint (Bilateral) 12/27/2017   Lumbar facet syndrome (Bilateral) (L>R) 12/27/2017   Chronic low back pain (Primary Area of Pain) (Bilateral) (L>R) 12/12/2017   Long term current use of opiate analgesic 12/12/2017   Chronic pain syndrome 12/12/2017   Disorder of skeletal system 12/12/2017   Rotator cuff impingement syndrome (Right) 09/26/2016   Epidermal cyst 03/02/2016   Hypercholesterolemia 10/21/2015   Osteoporosis 07/20/2015   Adjustment disorder with mixed anxiety and depressed mood 11/19/2013   Hypothyroidism 01/24/2013   Allergic rhinitis 01/21/2013   Hypertension    Coronary artery disease 04/19/2012   Atrophic vaginitis 04/19/2012   COPD (chronic obstructive pulmonary disease) with chronic bronchitis (HCC) 04/09/2012   Tobacco abuse 04/09/2012    Family History  Problem Relation Age of Onset   Hypertension Mother    Colon cancer Father    Lung cancer Father        was a former smoker   Cancer Father        lung   Diabetes Father    Lung cancer  Sister    Hypertension Other    Diabetes Other    Allergic rhinitis Neg Hx    Angioedema Neg Hx    Asthma Neg Hx    Atopy Neg Hx    Eczema Neg Hx    Immunodeficiency Neg Hx    Urticaria Neg Hx     Social History   Tobacco Use   Smoking status: Every Day    Current packs/day: 1.50    Average packs/day: 1.5 packs/day for 64.0 years (96.0 ttl pk-yrs)    Types: Cigarettes    Start date: 11/19/1960   Smokeless tobacco: Never   Tobacco comments:    1ppd as of 12/20/22 ep  Substance Use Topics   Alcohol use: Never    Alcohol/week: 1.0 standard drink of alcohol    Types: 1 Standard drinks or equivalent per week    Allergies  Allergen Reactions   Bacitracin-Polymyx-Neo-Hc [Bacitra-Neomycin-Polymyxin-Hc] Other (See Comments)   Benzocaine     Tongue swelling   Darvon [Propoxyphene Hcl]     HA   Monosodium Glutamate Diarrhea  Neosporin [Neomycin-Bacitracin Zn-Polymyx]     blisters   Nicoderm [Nicotine ]     arrythmia   Prednisone      Cannot tolerate high doses per patient- caused depressive thoughts   Propoxyphene Other (See Comments)    Current Meds  Medication Sig   aspirin  81 MG tablet Take 81 mg by mouth daily.   atorvastatin  (LIPITOR) 80 MG tablet Take 1 tablet (80 mg total) by mouth daily.   carisoprodol  (SOMA ) 350 MG tablet TAKE 1 TABLET(350 MG) BY MOUTH TWICE DAILY   Cholecalciferol  (VITAMIN D -3) 1000 UNITS CAPS Take 1 capsule by mouth daily.   Emollient (COLLAGEN EX) Take 2 capsules by mouth.   escitalopram  (LEXAPRO ) 20 MG tablet Take 1 tablet (20 mg total) by mouth daily.   ezetimibe  (ZETIA ) 10 MG tablet Take 1 tablet (10 mg total) by mouth daily.   fluticasone  (FLONASE ) 50 MCG/ACT nasal spray SHAKE LIQUID AND USE 2 SPRAYS IN EACH NOSTRIL IN THE MORNING AND AT BEDTIME   KRILL OIL PO Take by mouth.   levothyroxine  (SYNTHROID ) 75 MCG tablet Take 1 tablet (75 mcg total) by mouth daily before breakfast.   metoprolol  tartrate (LOPRESSOR ) 25 MG tablet TAKE 1 TABLET(25  MG) BY MOUTH TWICE DAILY   Multiple Vitamin (MULTIVITAMIN) capsule Take 1 capsule by mouth daily.   mupirocin  ointment (BACTROBAN ) 2 % Apply 1 application. topically 2 (two) times daily as needed.   nitroGLYCERIN  (NITROSTAT ) 0.4 MG SL tablet Place 1 tablet (0.4 mg total) under the tongue every 5 (five) minutes as needed.   omeprazole  (PRILOSEC) 20 MG capsule Take 1 capsule (20 mg total) by mouth daily.   PREVIDENT 5000 BOOSTER PLUS 1.1 % PSTE SMARTSIG:2-3 Times Daily   vitamin C (ASCORBIC ACID) 250 MG tablet Take 250 mg by mouth daily.   [DISCONTINUED] albuterol  (VENTOLIN  HFA) 108 (90 Base) MCG/ACT inhaler INHALE 2 PUFFS INTO THE LUNGS EVERY 6 HOURS AS NEEDED FOR WHEEZING OR SHORTNESS OF BREATH   [DISCONTINUED] budesonide -glycopyrrolate -formoterol  (BREZTRI  AEROSPHERE) 160-9-4.8 MCG/ACT AERO inhaler Inhale 2 puffs into the lungs in the morning and at bedtime.    Immunization History  Administered Date(s) Administered    sv, Bivalent, Protein Subunit Rsvpref,pf (Abrysvo) 08/24/2022   Fluad Quad(high Dose 65+) 09/01/2021, 08/24/2022   INFLUENZA, HIGH DOSE SEASONAL PF 08/23/2017, 08/22/2018, 08/06/2019, 08/28/2023, 08/26/2024   Influenza Nasal 08/24/2022   Influenza Split 10/22/2012, 09/20/2014, 09/21/2015, 08/15/2016   Influenza Whole 10/17/2013   Influenza-Unspecified 09/05/2015, 08/06/2019, 08/05/2020   Moderna Covid-19 Fall Seasonal Vaccine 1yrs & older 09/28/2022, 09/06/2023, 09/02/2024   PFIZER(Purple Top)SARS-COV-2 Vaccination 01/25/2020, 02/18/2020, 09/10/2020, 08/25/2021   Pfizer Covid-19 Vaccine Bivalent Booster 36yrs & up 08/25/2021   Pneumococcal Conjugate-13 12/06/2007   Pneumococcal Polysaccharide-23 11/19/2013   Respiratory Syncytial Virus Vaccine,Recomb Aduvanted(Arexvy) 08/24/2022   Td 05/05/2010   Zoster, Live 04/20/2011        Objective:     Vitals:   11/07/24 1356  BP: 136/76  Pulse: 62  Temp: 97.6 F (36.4 C)  Height: 5' 5 (1.651 m)  Weight: 138 lb (62.6  kg)  SpO2: 96%  TempSrc: Temporal  BMI (Calculated): 22.96     SpO2: 96 %  GENERAL: Well-developed, well-nourished woman, no acute distress.  Fully ambulatory.  No conversational dyspnea. HEAD: Normocephalic, atraumatic.  EYES: Pupils equal, round, reactive to light.  No scleral icterus.  MOUTH: Oral mucosa moist.  No thrush. NECK: Supple. No thyromegaly. Trachea midline. No JVD.  No adenopathy. PULMONARY: Good air entry bilaterally.  Coarse, otherwise, no adventitious sounds. CARDIOVASCULAR:  S1 and S2. Regular rate and rhythm.  No rubs, murmurs or gallops heard. ABDOMEN: Benign. MUSCULOSKELETAL: No joint deformity, no clubbing, no edema.  NEUROLOGIC: No overt focal deficit, no gait disturbance, speech is fluent. SKIN: Intact,warm,dry. PSYCH: Mood and behavior normal.   Assessment & Plan:     ICD-10-CM   1. COPD with asthma (HCC)  J44.89 Pulmonary function test    2. Allergic rhinitis, unspecified seasonality, unspecified trigger  J30.9     3. Tobacco dependence due to cigarettes  F17.210       Orders Placed This Encounter  Procedures   Pulmonary function test    Standing Status:   Future    Expiration Date:   11/07/2025    Where should this test be performed?:   Outpatient Pulmonary    What type of PFT is being ordered?:   Full PFT    Meds ordered this encounter  Medications   budesonide -glycopyrrolate -formoterol  (BREZTRI  AEROSPHERE) 160-9-4.8 MCG/ACT AERO inhaler    Sig: Inhale 2 puffs into the lungs in the morning and at bedtime.    Dispense:  10.7 g    Refill:  11   albuterol  (VENTOLIN  HFA) 108 (90 Base) MCG/ACT inhaler    Sig: INHALE 2 PUFFS INTO THE LUNGS EVERY 6 HOURS AS NEEDED FOR WHEEZING OR SHORTNESS OF BREATH    Dispense:  18 g    Refill:  6   Discussion:    COPD-asthma overlap syndrome Moderate severity as per 2017 pulmonary function test. Symptoms include coarse breath sounds and occasional productive cough with white-green sputum. No wheezing.  Symptoms exacerbated in hot and humid conditions. Current treatment includes Breztri  twice daily. Discussed potential addition of Ohtuvayre  nebulizer to reduce neutrophilic inflammation and improve dyspnea. She prefers to wait for changes in prescription coverage before starting Ohtuvayre . - Continue Breztri  twice daily - Scheduled pulmonary function test in four months - Provided information on Ohtuvayre  nebulizer for future consideration  Tobacco use disorder Continues to smoke one pack per day. Acknowledges smoking as a major factor in COPD progression. Declines lung cancer screening due to personal and family history of cancer and preference against treatments. - Encouraged smoking cessation     Smoking/Tobacco Cessation Counseling Reve Crocket is a current user of tobacco or nicotine  products. She is not ready to quit at this time. Counseling provided today addressed the risks of continued use and the benefits of cessation. Discussed tobacco/nicotine  use history, readiness to quit, and evidence-based treatment options including behavioral strategies, support resources, and pharmacologic therapies. Provided encouragement and educational materials on steps and resources to quit smoking. Patient questions were addressed, and follow-up recommended for continued support. Total time spent on counseling: 4 minutes.    Will see the patient in follow-up in 4 months time.   Advised if symptoms do not improve or worsen, to please contact office for sooner follow up or seek emergency care.    I spent 35 minutes of dedicated to the care of this patient on the date of this encounter to include pre-visit review of records, face-to-face time with the patient discussing conditions above, post visit ordering of testing, clinical documentation with the electronic health record, making appropriate referrals as documented, and communicating necessary findings to members of the patients care team.   C. Leita Sanders, MD Advanced Bronchoscopy PCCM [facility]    *This note was dictated using voice recognition software/Dragon.  Despite best efforts to proofread, errors can occur which can change the meaning. Any transcriptional errors that  result from this process are unintentional and may not be fully corrected at the time of dictation.

## 2024-11-07 NOTE — Patient Instructions (Signed)
 VISIT SUMMARY:  Today, we discussed your ongoing care for COPD with asthma overlap and your smoking habits. We reviewed your current medications and considered potential new treatments. We also talked about the importance of smoking cessation and scheduled a follow-up test to monitor your lung function.  YOUR PLAN:  -COPD-ASTHMA OVERLAP SYNDROME: COPD-asthma overlap syndrome is a condition where symptoms of both chronic obstructive pulmonary disease (COPD) and asthma are present. You will continue using Breztri  twice daily, and we have scheduled a pulmonary function test in four months to monitor your condition. We also discussed the potential addition of the Ohtuvayre  nebulizer in the future, depending on changes in your prescription coverage.  -TOBACCO USE DISORDER: Tobacco use disorder is a condition where there is a dependence on tobacco, which can worsen COPD. We discussed the importance of quitting smoking to help manage your COPD. You declined lung cancer screening due to personal and family history of cancer and your preference against treatments.  INSTRUCTIONS:  Please continue using Breztri  twice daily as prescribed. We have scheduled a pulmonary function test in four months to monitor your lung function. Consider the information provided about the Ohtuvayre  nebulizer for future use, depending on changes in your prescription coverage. It is highly recommended that you work on quitting smoking to improve your COPD symptoms and overall health.

## 2025-03-13 ENCOUNTER — Ambulatory Visit: Admitting: Pulmonary Disease

## 2025-03-13 ENCOUNTER — Encounter

## 2025-04-01 ENCOUNTER — Ambulatory Visit

## 2025-04-10 ENCOUNTER — Ambulatory Visit
